# Patient Record
Sex: Male | Born: 1950
Health system: Southern US, Community
[De-identification: ages and names within clinical notes are randomized; demographics above are authoritative.]

## PROBLEM LIST (undated history)

## (undated) DIAGNOSIS — K5090 Crohn's disease, unspecified, without complications: Secondary | ICD-10-CM

## (undated) DIAGNOSIS — R0600 Dyspnea, unspecified: Secondary | ICD-10-CM

## (undated) DIAGNOSIS — I219 Acute myocardial infarction, unspecified: Secondary | ICD-10-CM

## (undated) DIAGNOSIS — I10 Essential (primary) hypertension: Secondary | ICD-10-CM

## (undated) DIAGNOSIS — F329 Major depressive disorder, single episode, unspecified: Secondary | ICD-10-CM

## (undated) DIAGNOSIS — I2510 Atherosclerotic heart disease of native coronary artery without angina pectoris: Secondary | ICD-10-CM

## (undated) DIAGNOSIS — I639 Cerebral infarction, unspecified: Secondary | ICD-10-CM

## (undated) DIAGNOSIS — I6529 Occlusion and stenosis of unspecified carotid artery: Secondary | ICD-10-CM

## (undated) DIAGNOSIS — F32A Depression, unspecified: Secondary | ICD-10-CM

## (undated) DIAGNOSIS — N529 Male erectile dysfunction, unspecified: Secondary | ICD-10-CM

## (undated) DIAGNOSIS — K5190 Ulcerative colitis, unspecified, without complications: Secondary | ICD-10-CM

## (undated) DIAGNOSIS — Z9289 Personal history of other medical treatment: Secondary | ICD-10-CM

## (undated) DIAGNOSIS — N200 Calculus of kidney: Secondary | ICD-10-CM

## (undated) DIAGNOSIS — G459 Transient cerebral ischemic attack, unspecified: Secondary | ICD-10-CM

## (undated) DIAGNOSIS — E785 Hyperlipidemia, unspecified: Secondary | ICD-10-CM

## (undated) HISTORY — DX: Crohn's disease, unspecified, without complications: K50.90

## (undated) HISTORY — DX: Occlusion and stenosis of unspecified carotid artery: I65.29

## (undated) HISTORY — PX: HERNIA REPAIR: SHX51

## (undated) HISTORY — DX: Male erectile dysfunction, unspecified: N52.9

## (undated) HISTORY — DX: Ulcerative colitis, unspecified, without complications: K51.90

## (undated) HISTORY — DX: Atherosclerotic heart disease of native coronary artery without angina pectoris: I25.10

## (undated) HISTORY — PX: OTHER SURGICAL HISTORY: SHX169

## (undated) HISTORY — DX: Hyperlipidemia, unspecified: E78.5

## (undated) HISTORY — DX: Cerebral infarction, unspecified: I63.9

## (undated) HISTORY — DX: Calculus of kidney: N20.0

## (undated) HISTORY — DX: Major depressive disorder, single episode, unspecified: F32.9

## (undated) HISTORY — PX: COLONOSCOPY: SHX174

## (undated) HISTORY — DX: Depression, unspecified: F32.A

---

## 1993-01-19 HISTORY — PX: KNEE ARTHROSCOPY: SUR90

## 1998-06-28 ENCOUNTER — Ambulatory Visit (HOSPITAL_COMMUNITY): Admission: RE | Admit: 1998-06-28 | Discharge: 1998-06-28 | Payer: Self-pay | Admitting: Urology

## 1998-07-29 ENCOUNTER — Emergency Department (HOSPITAL_COMMUNITY): Admission: EM | Admit: 1998-07-29 | Discharge: 1998-07-29 | Payer: Self-pay | Admitting: Emergency Medicine

## 1999-11-14 ENCOUNTER — Ambulatory Visit (HOSPITAL_COMMUNITY): Admission: RE | Admit: 1999-11-14 | Discharge: 1999-11-14 | Payer: Self-pay | Admitting: Internal Medicine

## 1999-11-14 ENCOUNTER — Encounter: Payer: Self-pay | Admitting: Urology

## 1999-12-15 ENCOUNTER — Emergency Department (HOSPITAL_COMMUNITY): Admission: EM | Admit: 1999-12-15 | Discharge: 1999-12-15 | Payer: Self-pay | Admitting: Emergency Medicine

## 1999-12-19 ENCOUNTER — Ambulatory Visit (HOSPITAL_COMMUNITY): Admission: RE | Admit: 1999-12-19 | Discharge: 1999-12-19 | Payer: Self-pay | Admitting: Urology

## 1999-12-19 ENCOUNTER — Encounter: Payer: Self-pay | Admitting: Urology

## 2000-02-10 ENCOUNTER — Encounter: Admission: RE | Admit: 2000-02-10 | Discharge: 2000-03-25 | Payer: Self-pay | Admitting: Internal Medicine

## 2002-09-21 ENCOUNTER — Encounter: Payer: Self-pay | Admitting: Emergency Medicine

## 2002-09-21 ENCOUNTER — Inpatient Hospital Stay (HOSPITAL_COMMUNITY): Admission: EM | Admit: 2002-09-21 | Discharge: 2002-09-23 | Payer: Self-pay | Admitting: Emergency Medicine

## 2002-10-06 ENCOUNTER — Ambulatory Visit (HOSPITAL_COMMUNITY): Admission: RE | Admit: 2002-10-06 | Discharge: 2002-10-07 | Payer: Self-pay | Admitting: Cardiology

## 2003-01-20 DIAGNOSIS — I219 Acute myocardial infarction, unspecified: Secondary | ICD-10-CM

## 2003-01-20 DIAGNOSIS — Z9289 Personal history of other medical treatment: Secondary | ICD-10-CM

## 2003-01-20 HISTORY — PX: CORONARY ARTERY BYPASS GRAFT: SHX141

## 2003-01-20 HISTORY — DX: Acute myocardial infarction, unspecified: I21.9

## 2003-01-20 HISTORY — DX: Personal history of other medical treatment: Z92.89

## 2003-04-24 ENCOUNTER — Ambulatory Visit (HOSPITAL_COMMUNITY): Admission: RE | Admit: 2003-04-24 | Discharge: 2003-04-25 | Payer: Self-pay | Admitting: Cardiology

## 2003-04-30 ENCOUNTER — Inpatient Hospital Stay (HOSPITAL_COMMUNITY)
Admission: RE | Admit: 2003-04-30 | Discharge: 2003-05-05 | Payer: Self-pay | Admitting: Thoracic Surgery (Cardiothoracic Vascular Surgery)

## 2004-08-29 ENCOUNTER — Emergency Department (HOSPITAL_COMMUNITY): Admission: EM | Admit: 2004-08-29 | Discharge: 2004-08-29 | Payer: Self-pay | Admitting: Emergency Medicine

## 2006-06-20 HISTORY — PX: CARDIAC CATHETERIZATION: SHX172

## 2006-07-28 ENCOUNTER — Inpatient Hospital Stay (HOSPITAL_COMMUNITY): Admission: RE | Admit: 2006-07-28 | Discharge: 2006-07-29 | Payer: Self-pay | Admitting: Cardiology

## 2009-10-24 ENCOUNTER — Ambulatory Visit (HOSPITAL_COMMUNITY): Admission: RE | Admit: 2009-10-24 | Discharge: 2009-10-24 | Payer: Self-pay | Admitting: Gastroenterology

## 2009-11-04 ENCOUNTER — Encounter: Admission: RE | Admit: 2009-11-04 | Discharge: 2009-11-04 | Payer: Self-pay | Admitting: Gastroenterology

## 2010-06-03 NOTE — Cardiovascular Report (Signed)
NAMESPENCE, SOBERANO             ACCOUNT NO.:  [id number]   MEDICAL RECORD NO.:  [id number]          PATIENT TYPE:  OIB   LOCATION:  2807                         FACILITY:  [facility]   PHYSICIAN:  Barnett Abu, M.D.  DATE OF BIRTH:  08/04/1950   DATE OF PROCEDURE:  DATE OF DISCHARGE:                            CARDIAC CATHETERIZATION   PROCEDURE PERFORMED:  Percutaneous coronary interventions/drug-eluting stent implantation,  saphenous vein graft - Ramus.   INDICATIONS:  Jacob [name] is a 60 year old man who is now  approximately 3 years status post CABG.  He had persistent angina  pectoris for past 2 years.  Coronary and saphenous vein graft  angiography has identified an 80-90% stenosis at the anastomosis of the  saphenous vein graft to the ramus intermedius which is a large vessel.  Other vein grafts are open and adequate perfusion to the remained of the  myocardium is present.  He is brought to catheterization laboratory at  this time for percutaneous coronary intervention for treatment of his  angina pectoris.   PROCEDURE NOTE:  The patient was brought to the cardiac catheterization  laboratory in fasting state.  The right groin was prepped and draped in  usual sterile fashion.  Local anesthesia was obtained with infiltration  of 1% lidocaine.  A 6-French catheter sheath was inserted percutaneously  into the right femoral artery utilizing an anterior approach over  guiding J-wire.  The patient then received 0.75 mg/kg bolus of  Bivalirudin followed by 1.25 mg/kg per hour constant infusion.  The  resultant ACT was 379 seconds.  The patient received 300 mg of Plavix  earlier today.  A 6-French LCB guiding catheter was then advanced to the  ascending aorta where the saphenous vein graft os to the ramus  intermedius was engaged.  Cineangiography was performed in LAO and RAO  projections.  A 0.014 Luge intracoronary guidewire was passed across the  lesion without  difficulty.  Initial balloon dilatation was performed  with a 3.0/15 mm mild Maverick.  This was inflated to 6 atmospheres for  less than 1 minute.  This balloon was deflated and removed and a 3.5/15  mm Medtronic Endeavor drug-eluting stent was then advanced into place  and deployed at a peak pressure of 14 atmospheres.  Stent balloon was  deflated and removed and a 3.75/8 mm Quantum Maverick was advanced into  place such that it covered the anastomotic region, but did not enter the  more distal portion of the stent in the native artery.  It was inflated  to 14 atmospheres for less than 1 minute.  It was then deflated and  withdrawn slightly and subsequently inflated to the more proximal  portion but only to 16 atmospheres, again for less than one minute.  This post dilatation balloon was deflated and removed and  cineangiography and in orthogonal views both with and without the  guidewire in place.  Documented adequate patency.  The guiding catheter  and guidewire were then removed.  A right femoral arteriogram in the 45  degrees RAO angulation via the catheter sheath by hand injection  documented adequate anatomy  for placement percutaneous closure device  Angio-Seal.  This was subsequently deployed with good hemostasis and  intact distal pulse.  The patient was transported to recovery area in  stable condition.   ANGIOGRAPHY:  As mentioned, the lesion treated was in the anastomotic  region of the saphenous vein graft to the ramus intermedius.  It was  eccentric, short and 80% stenotic.  Following balloon dilatation and  stent implantation, there was no residual stenosis.  An excellent step  down could be seen at the distal portion of the vessel.  There was a  slight step-up at the proximal portion of the stented segment.   FINAL IMPRESSION:  1. Successful PCI/drug-eluting stent implantation, saphenous vein      graft - ramus intermedius.  2. Severe three-vessel atherosclerotic  coronary vascular disease.  3. I did perform coronary angiography of the right coronary in      anticipation of a need for stenting of the proximal      right coronary lesion seen on diagnostic angiography.  However, it      was not present to any significant degree on today's angiography      and probably represented proximal catheter induced spasm.  4. Typical angina was not reproduced with device insertion or balloon      inflation      Barnett Abu, M.D.  Electronically Signed     JHE/MEDQ  D:  07/28/2006  T:  07/28/2006  Job:  [id number]

## 2010-06-03 NOTE — Discharge Summary (Signed)
Jacob [name], Jacob [name]             ACCOUNT NO.:  [id number]   MEDICAL RECORD NO.:  [id number]          PATIENT TYPE:  INP   LOCATION:  6532                         FACILITY:  [facility]   PHYSICIAN:  Barnett Abu, M.D.  DATE OF BIRTH:  12/15/1950   DATE OF ADMISSION:  07/28/2006  DATE OF DISCHARGE:  07/29/2006                               DISCHARGE SUMMARY   DISCHARGE DIAGNOSES:  1. Coronary artery disease status post drug-eluting stent to the      saphenous vein graft to the ramus.  2. Known coronary artery disease.  3. History of coronary artery bypass grafting in April of 2005.  4. Dyslipidemia.  5. Crohn's disease.  6. Nephrolithiasis.  7. Long-term medication use.   HOSPITAL COURSE:  Jacob [name] was admitted to [facility] on July 28, 2006  with recurrent angina pectoris.  Cardiac catheterization in June of 2008  showed high-grade stenosis of the saphenous vein graft to the ramus  intermedius.  He was admitted and underwent drug-eluting stent placement  to this area resulting in an 80% lesion down to 0% stenosis  postprocedure with good flow.  Patient was Angio-Sealed.  Patient  remained in the hospital overnight, remained stable, discharged to home.   The patient's labs on discharge:  Sodium 141, potassium 3.9, BUN 11,  creatinine 1.43, hemoglobin 13.4, hematocrit 39.0, platelets 178.   DISCHARGE MEDICATIONS:  1. Plavix 75 mg a day.  2. Enteric-coated aspirin 325 mg a day.  3. Wellbutrin 150 mg a day.  4. Toprol XL 50 mg a day.  5. Ranexa 500 mg b.i.d.  6. Pravastatin 40 mg a day.  7. Sublingual nitroglycerin p.r.n. chest pain.   He is to stop his Imdur.   Follow up with Dr. Gwendel Hanson extender on July 28 at 10:30 a.m.   Clean cath site gently with soap and water, no scrubbing.  Remain on a  low sodium, heart-healthy diet.  Increase activity slowly.  May shower  and bather.  No lifting over ten pounds for weeks, no driving for two  days.      Jacob [name],  P.A.      Barnett Abu, M.D.  Electronically Signed    LB/MEDQ  D:  07/29/2006  T:  07/29/2006  Job:  [id number]

## 2010-06-06 NOTE — Consult Note (Signed)
NAME:  Jacob [name], Jacob [name]                       ACCOUNT NO.:  [id number]   MEDICAL RECORD NO.:  [id number]                   PATIENT TYPE:  OIB   LOCATION:  3532                                 FACILITY:  [facility]   PHYSICIAN:  Revonda Standard. Roxan Hockey, M.D.         DATE OF BIRTH:  1950-09-23   DATE OF CONSULTATION:  04/24/2003  DATE OF DISCHARGE:                                   CONSULTATION   Jacob [name] is a 60 year old gentleman who presents with a chief  complaint of chest pain.   HISTORY OF PRESENT ILLNESS:  Jacob [name] is a 60 year old gentleman with  a strong family history of coronary disease.  He had his first presentation  with unstable angina in September of 2004 and at that time he underwent  sequential stenting with drug-eluting stents to the ramus intermedius on the  3rd of September followed by stenting of the LAD on the 17th.  He also had a  residual 70% right posterior descending stenosis.  He initially did well but  over the past several months he has been having class 2 exertional angina.  He develops anterior chest discomfort as wells as shortness of breath with  moderate exercise or exertion.  This has almost always been when it is very  cold outside.  He does __________ nocturnal pain.  He was seen back in  consultation by Dr. Leonia Reeves and underwent repeat cardiac catheterization  today that showed severe three-vessel coronary disease and left main  disease.  He had 75% distal left main stenosis, 60% proximal LAD, 50%  proximal circumflex and his ramus had proximal 30%-40% stenosis.  His right  coronary artery is 60%-70% in the acute marginal and 70% in his posterior  descending.  Left ventricular function was preserved with an ejection  fraction of 60%.   CURRENT MEDICATIONS:  1. Plavix 75 mg daily.  2. Toprol XL 50 mg daily.  3. Nitroglycerin p.r.n.  4. Vytorin 10-40 one p.o. daily.  5. Aspirin 325 mg daily.   ALLERGIES:  He has no known drug  allergies.   PAST MEDICAL HISTORY:  Significant for coronary artery disease, see HPI.  Also, a history of Crohn's disease, hyperlipidemia and nephrolithiasis.   PAST SURGICAL HISTORY:  1. Right knee arthroplasty in 1995.  2. Carpal tunnel syndrome, left forearm, 1998.   FAMILY HISTORY:  Significant for the father dying at age 22 of an MI.   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  He is married.  He  occasionally uses alcohol.  He dips snuff and has for 22 years.   REVIEW OF SYSTEMS:  He has been feeling tired and rundown lately.  He does  chest discomfort and shortness of breath with moderate activity.  He has not  had any paroxysmal nocturnal dyspnea, orthopnea or peripheral edema.  He  does bleed and bruise easily since he has been on Plavix but had no history  prior to that.  All other systems are negative.   PHYSICAL EXAMINATION:  CONSTITUTIONAL: Jacob [name] is a well-appearing 23-  year-old white male in no acute distress.  He is well-developed and well-  nourished, in no acute distress.  VITAL SIGNS: Blood pressure 118/76, pulse 68, respirations are 20, he is 6  feet tall and 227 pounds.  HEENT EXAM: Unremarkable.  NECK: Supple with no thyromegaly, adenopathy or bruits.  CARDIAC EXAM: Regular rate and rhythm.  Normal S1 and S2.  No rubs, murmurs  or gallops.  LUNGS: Clear to auscultation and percussion bilaterally.  ABDOMEN: Soft and nontender.  EXTREMITIES: Without clubbing, cyanosis or edema.  He does have positive a  Allen's sign on the left side with delayed filling with radial compression.  He has 2+ radial and dorsalis pedis and posterior tibial pulses bilaterally.  There are no significant varicosities.   LABORATORY DATA:  Glucose 99, BUN and creatinine 10 and 1.2, sodium 138,  potassium 4.3, CO2 of 30, hemoglobin 41, white count 5, platelets 196, PT  12.2, PTT 33.  Last cholesterol panel showed a total cholesterol of 164, LDL  was 113, HDL  44.   IMPRESSION:  Jacob [name] is a 60 year old gentleman with recurrent  coronary artery disease.  He first presented with this in September of last  year, now has recurrent class 2 symptoms and catheterization has a 75%  distal left main stenosis.  Coronary artery bypass grafting is indicated for  survival benefit and relief of symptoms. I discussed in detail with him the  proposed operation including the incision to be used, expected outcomes and  expected recovery.  He understands the indications, risks, benefits and  alternatives.  He understands the risks including but not limited to death,  stroke, MI, DVT, PE, bleeding, possible  need for transfusion, infection as  well as organ system dysfunction.  He is a low risk candidate with all of  these except for bleeding because he is on Plavix.  He will be at high risk  for massive transfusion and return to OR if operated on urgently.  He has no  unstable symptoms, therefore, it should safe to wait.  I recommended we stop  Plavix for at least 5-7 days prior to his surgery to minimize his risk for  perioperative bleeding.  He will remain on aspirin during that time.   He understands that this operation is palliative and not curative and I  believe he is at risk for a future cardiac event as well as recurrent  disease and essentially the need for additional angioplasty and more bypass  surgery in the future.  Will plan to use bilateral mammary arteries due to  his young age but will need to use saphenous veins as well.  He is  not a candidate for a radial harvest due to his positive Allen's test.  The  plan will be for him to discontinue his Plavix , the 5-7 day window would  include surgery for next Monday or Tuesday depending on the operative  availability, which we will check on in the morning.                                               Revonda Standard Roxan Hockey, M.D.    SCH/MEDQ  D:  04/24/2003  T:  04/26/2003  Job:   [id number]  cc:   Barnett Abu, M.D.  [address]  [address] [place] [address]  Fax: [phone]   Leilani Merl, M.D.  [phone]  [place] [address]  Fax: [phone]

## 2010-06-06 NOTE — Cardiovascular Report (Signed)
NAME:  Jacob [name], Jacob [name] NO.:  [id number]   MEDICAL RECORD NO.:  [id number]                   PATIENT TYPE:  OIB   LOCATION:  2867                                 FACILITY:  [facility]   PHYSICIAN:  Barnett Abu, M.D.               DATE OF BIRTH:  12/30/1950   DATE OF PROCEDURE:  04/24/2003  DATE OF DISCHARGE:                              CARDIAC CATHETERIZATION   PROCEDURES PERFORMED:  1. Left heart catheterization.  2. Coronary angiography.  3. Left ventriculogram.   INDICATION:  Alexio Sroka is 60 year old man who, in September 2004,  underwent PTCA and stent implantation in the mid ramus intermedius and the  proximal and ostial left anterior descending artery for class III angina  pectoris.  He has not had a myocardial infarction.  He has developed  recurrent class II angina pectoris; this has been especially true over the  past two winter months.  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  Therefore, he is brought now to  cardiac catheterization laboratory to identify possible in-stent restenosis  or progressive disease and provide for further therapeutic options.   PROCEDURAL NOTE:  The patient was brought to the cardiac catheterization  laboratory in the fasting state.  The right groin was prepped and draped in  the usual sterile fashion.  Local anesthesia was obtained with infiltration  of 1% lidocaine.  A 5-French catheter sheath was inserted percutaneously  into the right femoral artery utilizing and anterior approach over a guiding  J-wire.  A 110-cm pigtail catheter was used to measure pressures in the  ascending aorta and in the left ventricle, both prior to and following the  ventriculogram.  A 30-degree RAO scinti left ventriculogram was performed  utilizing the power injector; 44 cc of nonionic contrast were injected at  13  cc per second.  Following the sublingual administration of 0.4 mg of  nitroglycerin, scinti angiography of the left and right coronary arteries  was conducted in multiple LAO and RAO projections utilizing 5-French #4 left  and right Judkins catheters.  All catheter manipulations were performed  using fluoroscopic observation and exchanges performed over a long guiding J-  wire.  At the completion of the procedure, the catheter and catheter sheath  were removed.  Hemostasis was achieved by direct pressure.  The patient was  transported to the recovery area in stable condition with an intact distal  pulse.   HEMODYNAMICS:  Systemic arterial pressure of 127/83 with a mean of 102 mmHg.  There was no systolic gradient across the aortic valve.  The left  ventricular end-diastolic pressure was 13 mmHg pre-ventriculogram.   ANGIOGRAPHY:  The left ventriculogram demonstrated normal chamber size and  normal global systolic function with anterolateral hypokinesis.  Stents were  clearly visible  in the proximal left coronary artery branches.  There is no  significant mitral regurgitation and the aortic valve was trileaflet and  opens normally.   There was right-dominant coronary system present.  The main left coronary  artery is highly diseased; the vessel contains a distal 75-80% narrowing  just before it trifurcates into the anterior descending, the ramus  intermedius and the proximal circumflex. Of note, the ramus intermedius is  as large as the anterior descending artery.   The left anterior descending artery itself is moderately diseased; there is  a stent in the proximal segment which reaches to the origin and extends  approximately 20 mm.  The proximal segment is 50% re-narrowed.  The ongoing  anterior descending artery is relatively narrow but without significant  obstruction and no significant diagonal branches arise.  It reaches but does  not traverse the apex .   The left  circumflex coronary artery is moderately diseased; as mentioned,  there is a 50% narrowing in the proximal/ostial portion of the left  circumflex which amounts to two small first and second branches and then a  large third marginal branch which is on the obtuse margin.  There is a 40%  narrowing in the mid portion of the circumflex just before the origin of the  second small marginal.  There is a large ramus intermedius that has a stent  in the mid portion that is widely patent.  The proximal portion is diffusely  diseased and approximately 40% stenotic.   The right coronary artery and its branches were moderately diseased; the  vessel contains luminal irregularities and there is a 30% narrowing in the  proximal segment.  There is no significant narrowing in the mid portion.  There is a 20-30% stenosis in the distal portion.  There is a moderate-sized  acute marginal branch that has a 70% narrowing in its origin.  There is a  small to moderate-sized posterior descending artery without significant  obstruction.  There is a moderate-sized posterolateral segment with two left  ventricular branches, the first of which is moderate in size and contains a  50% proximal narrowing.   Collateral vessels are not seen.   FINAL IMPRESSION:  1. Atherosclerotic cardiovascular disease, left main and severe two-vessel.  2. Intact biventricular size and global systolic function with mild regional     wall motion abnormality as noted.  3. Normal central aortic pressure and left ventricular end-diastolic     pressure.   PLAN/RECOMMENDATIONS:  The patient will be referred for coronary bypass  surgery given this progressive and recurrent disease, especially in the  distal left main.                                               Barnett Abu, M.D.    JHE/MEDQ  D:  04/24/2003  T:  04/26/2003  Job:  [id number]   cc:   Leilani Merl, M.D.  [address] [place]  [place] [address]  Fax: [phone]

## 2010-06-06 NOTE — Op Note (Signed)
NAME:  Jacob, [name] NO.:  [id number]   MEDICAL RECORD NO.:  [id number]                   PATIENT TYPE:  INP   LOCATION:  2315                                 FACILITY:  [facility]   PHYSICIAN:  Revonda Standard. Roxan Hockey, M.D.         DATE OF BIRTH:  May 31, 1950   DATE OF PROCEDURE:  04/30/2003  DATE OF DISCHARGE:                                 OPERATIVE REPORT   PREOPERATIVE DIAGNOSIS:  Left main and three vessel coronary disease.   POSTOPERATIVE DIAGNOSIS:  Left main and three vessel coronary disease.   PROCEDURE:  Median sternotomy, extracorporeal circulation, coronary artery  bypass grafting x 5 (left internal mammary to left anterior descending, free  right internal mammary artery to the second obtuse marginal, saphenous vein  graft to ramus intermedius, sequential saphenous vein graft to the posterior  descending and posterior lateral), endoscopic vein harvest right thigh.   SURGEON:  Revonda Standard. Roxan Hockey, M.D.   ASSISTANTHoyle Sauer A. Zigmund Gottron.   ANESTHESIA:  General.   FINDINGS:  Good quality targets.  LAD intramyocardial.  Good quality  conduits.  Left ventricular hypertrophy.   INDICATIONS:  Jacob [name] is a 60 year old gentleman who has a history of  coronary disease dating back to September of last year when he presented  with angina and had angioplasty and stenting of his LAD and ramus  intermedius.  He now presents with a recurrent class II anginal symptoms and  a catheterization shows 75% distal left main stenosis involving the takeoff  of his circumflex, ramus and LAD.  He also has significant disease in the  right coronary system.  The patient was referred for coronary artery bypass  grafting.  The indications, risks, benefits and alternatives were discussed  in detail with the patient.  He understood and accepted the risks and agreed  to proceed.  He was not a candidate for radial artery harvest due to the  positive Allen's  test.  The decision was made to use bilateral mammary  arteries.   DESCRIPTION OF PROCEDURE:  Jacob [name] was brought to the preop holding  area on April 30, 2003.  There the anesthesia service placed lines to  monitor arterial, central venous and pulmonary arterial pressure.  Intravenous antibiotics were administered.  The patient was taken to the  operating room, anesthetized and intubated.  A Foley catheter was placed.  The chest, abdomen and legs were prepped and draped in the usual fashion.   A median sternotomy was performed.  The left internal mammary was harvested  using standard technique.  The mammary was a good quality vessel.  Simultaneously, an incision was made in the medial aspect of the right leg  at the level of the knee, and the greater saphenous vein was harvested  endoscopically from the right thigh.  It was of relatively small caliber for  the patient's size but was of good quality.  The patient was given 5000  units  of heparin prior to dividing the distal end of the left mammary  artery.  There was excellent flow through the vessel.  The mammary was  placed and a papaverine soaked sponge was placed into the left pleural  space.   Next, the right internal mammary artery was harvested again using standard  technique.  It also was a good size vessel which appeared to be of good  quality.  The right mammary had excellent flow and the distal end was  divided as well.  This was planned for a free graft. It was divided and the  proximal stump was suture ligated with a 2-0 silk suture and then stainless  steel clip was applied as well.  The right mammary was placed in heparinized  papaverine saline solution.   The remainder of the full heparin dose was given.  The pericardium was  opened. The ascending aorta was inspected and palpated.  Of note, it was a  relatively short aorta for a man of this size which could make access  difficult on redo situations.  The aorta  was cannulated to be concentric and  2-0 Ethibond nonpledgeted pursestring sutures, a dual stage venous cannula  was placed via pursestring suture in the right atrial appendage.  Cardiopulmonary bypass was instituted, and the patient was cooled to 32  degrees Celsius.  The coronary arteries were inspected and anastomotic sites  chosen.  The conduits were inspected and cut to length.  A foam pad was  placed in the pericardium to protect the phrenic nerves.  A temperature  probe was placed in the myocardial septum.  A cardioplegia cannula was  placed in the ascending aorta.   The aorta was crossclamped.  The left ventricle was entered via the aortic  root and then cardiac arrest was achieved with a combination of cold  antegrade cardioplegia and topical iced saline.  After achieving cardiac  arrest, a myocardial aorta was entered in the aorta and the following distal  anastomosis were performed.   First, a reverse saphenous vein graft was placed end-to-side to the ramus  intermedius.  This was a 1.5 mm good quality vessel.  The vein graft was of  good quality.  The anastomosis was performed end-to-side with a running 7-0  Prolene suture.  This anastomosis was probed proximally and distally to  ensure patency.  There was good flow through the graft.  Cardioplegia was  administered.  There was good hemostasis.   Next, a reverse saphenous vein graft was placed end-to-side to the posterior  descending and posterior lateral branches of the right coronary.  There were  three branches supplying the inferior wall.  These were the two largest of  the three vessels.  The third vessel was small and tortuous and not  graftable.  The vein graft was anastomosis side-to-side to the posterior  descending with a running 7-0 Prolene suture.  This anastomosis probed  easily proximally and distally.  There was good flow through this anastomosis.  The distal end of the vein then was cut to length and   anastomosed to PL2 with a running 7-0 Prolene suture.  Both target vessels  were 1.5 mm and good quality.  Cardioplegia was administered.  There was  good flow through the graft, and there was good hemostasis at the  anastomoses.  Next, the distal end of the free right mammary artery was  spatulated.  It was 1.5 mm at its distal end and approximately 3 mm at its  proximal end.   The right mammary then was anastomosed end-to-side to OM-2 with a running 8-  0 Prolene suture.  Again, this anastomosis probed easily proximally and  distally.  Cold heparinized saline was flushed through the graft with  excellent flow.  The mammary pedicle was tacked to the epicardial surface of  the heart with 6-0 Prolene sutures.   Additional cardioplegia then was administered down the vein grafts and the  aortic root.  There was good backbleeding from the free right mammary graft.  The vein grafts were cut to length.   The left internal mammary artery was brought through a window in the  pericardium and the distal end was spatulated.  It was then anastomosed to  the mid LAD.  The mid LAD was intramyocardial.  It was a 1.5 mm good quality  vessel at the site of the anastomosis.  An end-to-side anastomosis was  performed with a running 8-0 Prolene suture.  At the completion of the  mammary to left anterior descending anastomosis, the bulldog clamps were  briefly remove to inspect for hemostasis.  Immediate rapid septal rewarming  was noted.  The bulldog clamped was replaced.  Bleeding was controlled from  the myocardial dissection.  The mammary pedicle was tacked to the epicardial  surface of the heart with 6-0 Prolene sutures.   Additional cardioplegia was then administered down the root to once again  achieve adequate septal cooling.  The cardioplegia cannula then was removed  from the ascending aorta.  The proximal anastomoses were performed to 4.0 mm  punch aortotomies.  A running 7-0 Prolene suture was  first used to  anastomosis the proximal end of the right mammary graft to the aorta.  The  proximal vein graft anastomosis was performed with 6-0 Prolene sutures.  At  the completion of the final proximal anastomosis the patient was placed in  steep Trendelenburg position.  The aortic root was deaired and the aortic  cross clamp was removed.  The total cross clamp time was 110 minutes.  The  lidocaine was administered, and the bulldog clamp was removed from the left  mammary artery prior to removal of the cross clamp.  Residual air was  evacuated from the vein grafts, and the bulldog clamps were removed from  them.  All proximal and distal anastomoses were inspected for hemostasis.  While the patient was being rewarmed, epicardial pacing wires were placed on  the right ventricle and right atrium.  When the patient had been rewarmed to  a core temperature of 37 degrees Celsius, he was weaned from cardiopulmonary bypass without difficulty and without inotropic support.  He was atrially  for rate at the time of separation from bypass.  Total bypass time was 172  minutes.   A test dose of Protamine was administered and was well tolerated.  The  atrial and aortic cannulae were removed.  There was good hemostasis at the  cannulation sites.  The remainder of the Protamine was administered without  incident.  The chest was irrigated with one liter of warm normal saline  containing 1 g of vancomycin.  Hemostasis was achieved but was only fair.  There was diffuse bleeding particularly from the bone.  The chest was  irrigated with one liter of warm normal saline containing 1 g of vancomycin.  The pericardium was reapproximated in the midline with interrupted 3-0 silks  sutures.  It came together easily without tension.  Bilateral pleural and  single mediastinal chest tubes  were placed through separate subcostal  incisions.  The sternum was closed with heavy gauge interrupted stainless  steel wires.   The remainder of the incisions were closed with the standard  fashion with subcuticular skin closures.  All sponge, needle and instrument  counts were correct at the end of the procedure.  There were no  intraoperative complications.  The patient was taken from the operating room  to the surgical intensive care unit in critical but stable condition.                                               Revonda Standard Roxan Hockey, M.D.    SCH/MEDQ  D:  04/30/2003  T:  05/01/2003  Job:  [id number]   cc:   Barnett Abu, M.D.  [address]  [address] [place] [address]  Fax: [phone]   Leilani Merl, M.D.  [phone]  [place] [address]  Fax: [phone]

## 2010-06-06 NOTE — Discharge Summary (Signed)
NAME:  Jacob [name], Jacob [name] NO.:  [id number]   MEDICAL RECORD NO.:  [id number]                   PATIENT TYPE:  INP   LOCATION:  2033                                 FACILITY:  [facility]   PHYSICIAN:  Revonda Standard. Roxan Hockey, M.D.         DATE OF BIRTH:  [birth year]   DATE OF ADMISSION:  04/30/2003  DATE OF DISCHARGE:  05/05/2003                                 DISCHARGE SUMMARY   ADMISSION DIAGNOSIS:  Chest pain.   ADDITIONAL/DISCHARGE DIAGNOSES:  1. Severe three-vessel coronary artery disease, status post previous     percutaneous transluminal coronary angioplasty and stent placement.  2. History of Crohn's disease.  3. History of nephrolithiasis.  4. Dyslipidemia.   PROCEDURE:  1. Coronary artery bypass grafting x5 (left internal mammary artery to the     left anterior descending, free right internal mammary artery to the     second obtuse marginal, saphenous vein graft to the ramus intermedius,     sequential saphenous vein graft to the posterior descending artery and     posterolateral).  2. Endoscopic vein harvest, right thigh.   HISTORY:  The patient is a 60 year old male with history of coronary artery  disease.  He was then followed by Dr. Leonia Reeves.  He is status post previous  PTCA and stenting of both the ramus intermedius and the ostial left anterior  descending in 2004.  He continued to have symptoms of anterior chest  discomfort, dyspnea on exertion.  He was recently seen back in the office  for followup by Dr. Leonia Reeves and it was recommended that he should proceed  with repeat cardiac catheterization at this time to reevaluate his anatomy.  He was brought into [facility]  on April 24, 2003 and cardiac  catheterization at that time showed normal left ventricular function with an  ejection fraction of 60%.  He was noted to have 75% stenosis of the left  main distally as well as 60% proximal LAD, 50% proximal left circumflex, 30-  40%  proximal ramus, and 60-70% proximal right coronary artery and acute  marginal.  Because of his progressive disease and failure of angioplasty and  stent placement, a cardiothoracic surgery consultation was obtained for  consideration of surgical revascularization.  The patient was seen by Dr.  Modesto Charon and his films were reviewed.  It was agreed that his best  course of action would be to proceed with surgery at this time.  Because the  patient had been on Plavix, it was felt to be in his best interests to allow  him to go home on a leave of absence and discontinue the Plavix for five to  seven days before returning for surgery.  The patient agreed to proceed and  was discharged home on April 25, 2003.  He was scheduled for outpatient  readmission on April 30, 2003.   HOSPITAL COURSE:  He was admitted on April 30, 2003 to [facility]  and was taken to the operating room by Dr. Modesto Charon.  He underwent  CABG x5 as described in detail above.  He tolerated the procedure well and  was transferred to the SICU in stable condition.  He was able to be  extubated shortly after surgery.  He was hemodynamically stable although  somewhat hypotensive on postoperative day #1.  He initially required a Neo-  Synephrine drip for blood pressure maintenance.  Over the course of his  first postoperative day, this was weaned and discontinued.  He remained in  the unit for further observation.  Chest tubes were removed and his  hemodynamic monitoring devices were removed.  He was mobilized with cardiac  rehab phase 1.  By postoperative day #2, he was off all drips and was felt  to be ready for transfer to the floor.  At that time he was noted to be  volume overloaded and was started on Lasix and potassium.  Postoperatively  he has done very well.  He has been restarted on __________  for his  hyperlipidemia.  He has also been started on aspirin and beta blocker  therapy.  He had one  brief episode of nonsustained ventricular tachycardia,  around 10 beats without treatment.  Since that time he has had no further  arrhythmias.  He has been diuresing well.  He is presently back down within  seven pounds of his preoperative weight.  On exam he still has 1+ lower  extremity edema.  He has been afebrile and all vital signs have been stable.  He has been weaned from supplemental oxygen and is maintaining O2  saturations of greater than 90% on room air.  His surgical incisions are all  healing well.  He is ambulating in the halls without difficulty.  He is  tolerating regular diet and his having normal bowel and bladder function.  His most recent labs drawn on May 03, 2003 showed potassium 3.8, which has  been supplemented, BUN 13, creatinine 1.2.  White count 7.7, platelets 123,  hemoglobin 9.4, hematocrit 26.9.  It is felt that if he continues to remain  stable for the next 24 hours and no other changes occur, he will be ready  for discharge home on May 05, 2003.   In anticipation of discharge, the pacing wires and chest tube sutures have  been removed.   DISCHARGE MEDICATIONS:  1. Enteric-coated aspirin 325 mg daily.  2. Toprol-XL 25 mg daily.  3. ___________ 10/40 mg one daily.  4. Lasix 40 mg daily times one week.  5. K-Dur 20 mEq daily times one week.  6. Tylox one to two q.4h. p.r.n. for pain.   DISCHARGE INSTRUCTIONS:  1. He is to refrain from driving, heavy lifting or strenuous activity.  2. He may continue ambulating daily and using his incentive spirometer.  3. He is asked to shower daily and clean his incisions with soap and water.  4. He will continue a low-fat, low-sodium diet.   FOLLOW UP:  He is asked to make an appointment to see Dr. Leonia Reeves in two  weeks and have a chest x-ray at that visit.  He will then see Dr.  Roxan Hockey on May 9 at 12 noon.  He is asked to bring his chest x-ray to this appointment for Dr. Roxan Hockey to review.  In the  interim, if he  experiences any problems or has questions, he should call our office  immediately.  Suzzanne Cloud, P.A.                        Revonda Standard Roxan Hockey, M.D.    GC/MEDQ  D:  05/04/2003  T:  05/06/2003  Job:  [id number]   cc:   Leilani Merl, M.D.  [address] [place] [address]  Fax: [phone]   Barnett Abu, M.D.  [address] [place]  [place] [address]  Fax: [phone]

## 2010-11-04 LAB — CBC
HCT: 39
MCV: 88.2
Platelets: 178
RDW: 13

## 2010-11-04 LAB — BASIC METABOLIC PANEL
BUN: 11
Chloride: 105
Glucose, Bld: 91
Potassium: 3.9

## 2012-10-17 ENCOUNTER — Encounter (HOSPITAL_COMMUNITY): Payer: Self-pay | Admitting: Pharmacy Technician

## 2012-10-17 ENCOUNTER — Encounter (HOSPITAL_COMMUNITY): Payer: Self-pay | Admitting: *Deleted

## 2012-10-17 ENCOUNTER — Other Ambulatory Visit: Payer: Self-pay | Admitting: Gastroenterology

## 2012-10-25 ENCOUNTER — Encounter (HOSPITAL_COMMUNITY): Payer: Self-pay | Admitting: Anesthesiology

## 2012-10-25 ENCOUNTER — Ambulatory Visit (HOSPITAL_COMMUNITY): Payer: Medicare HMO | Admitting: Anesthesiology

## 2012-10-25 ENCOUNTER — Encounter (HOSPITAL_COMMUNITY): Payer: Self-pay | Admitting: *Deleted

## 2012-10-25 ENCOUNTER — Ambulatory Visit (HOSPITAL_COMMUNITY)
Admission: RE | Admit: 2012-10-25 | Discharge: 2012-10-25 | Disposition: A | Discharge status: Home / Self Care | Payer: Medicare HMO | Source: Ambulatory Visit | Attending: Gastroenterology | Admitting: Gastroenterology

## 2012-10-25 ENCOUNTER — Encounter (HOSPITAL_COMMUNITY)
Admission: RE | Disposition: A | Discharge status: Home / Self Care | Payer: Self-pay | Source: Ambulatory Visit | Attending: Gastroenterology

## 2012-10-25 DIAGNOSIS — K6389 Other specified diseases of intestine: Secondary | ICD-10-CM | POA: Insufficient documentation

## 2012-10-25 DIAGNOSIS — K5190 Ulcerative colitis, unspecified, without complications: Secondary | ICD-10-CM | POA: Insufficient documentation

## 2012-10-25 DIAGNOSIS — E7800 Pure hypercholesterolemia, unspecified: Secondary | ICD-10-CM | POA: Insufficient documentation

## 2012-10-25 DIAGNOSIS — Z951 Presence of aortocoronary bypass graft: Secondary | ICD-10-CM | POA: Insufficient documentation

## 2012-10-25 DIAGNOSIS — I2510 Atherosclerotic heart disease of native coronary artery without angina pectoris: Secondary | ICD-10-CM | POA: Insufficient documentation

## 2012-10-25 HISTORY — PX: COLONOSCOPY WITH PROPOFOL: SHX5780

## 2012-10-25 HISTORY — DX: Personal history of other medical treatment: Z92.89

## 2012-10-25 HISTORY — DX: Acute myocardial infarction, unspecified: I21.9

## 2012-10-25 SURGERY — COLONOSCOPY WITH PROPOFOL
Anesthesia: Monitor Anesthesia Care

## 2012-10-25 MED ORDER — SODIUM CHLORIDE 0.9 % IV SOLN: INTRAVENOUS | Status: DC

## 2012-10-25 MED ORDER — KETAMINE HCL 10 MG/ML IJ SOLN
INTRAMUSCULAR | Status: DC | PRN
  Administered 2012-10-25: 40 mg via INTRAVENOUS

## 2012-10-25 MED ORDER — MIDAZOLAM HCL 5 MG/5ML IJ SOLN
INTRAMUSCULAR | Status: DC | PRN
  Administered 2012-10-25 (×2): 1 mg via INTRAVENOUS

## 2012-10-25 MED ORDER — PROPOFOL INFUSION 10 MG/ML OPTIME
INTRAVENOUS | Status: DC | PRN
  Administered 2012-10-25: 140 ug/kg/min via INTRAVENOUS

## 2012-10-25 MED ORDER — FENTANYL CITRATE 0.05 MG/ML IJ SOLN
INTRAMUSCULAR | Status: DC | PRN
  Administered 2012-10-25 (×2): 50 ug via INTRAVENOUS

## 2012-10-25 MED ORDER — LACTATED RINGERS IV SOLN
INTRAVENOUS | Status: DC
  Administered 2012-10-25: 1000 mL via INTRAVENOUS

## 2012-10-25 SURGICAL SUPPLY — 21 items

## 2012-10-25 NOTE — Op Note (Signed)
Problem: Left-sided ulcerative colitis diagnosed in 1979  Endoscopist: Earle Gell  Premedication: Propofol administered by anesthesia  Procedure: Surveillance colonoscopy The patient was placed in the left lateral decubitus position. Anal inspection and digital rectal exam were normal. Pentax pediatric colonoscope was introduced into the rectum and advanced to the cecum. A normal-appearing appendiceal orifice and ileocecal valve were identified. Colonic preparation for the exam today was good.  Rectum. Normal. Retroflexed view of the distal rectum normal.  Sigmoid colon and descending colon. Normal.  Splenic flexure. Normal.  Transverse colon. Normal.  Hepatic flexure. Normal.  Ascending colon. Normal.  Cecum and ileocecal valve. Normal.  Surveillance of mucosal biopsies. A total of 32 biopsies were performed along the length of the colon and rectum: 8 biopsies were performed from the right colon, 8 biopsies were performed from the transverse colon, 8 biopsies were performed from the descending colon, and 8 biopsies were performed from the rectosigmoid colon.   Assessment: Normal surveillance proctocolonoscopy to the cecum. Random colon biopsies to rule out mucosal dysplasia pending.

## 2012-10-25 NOTE — Preoperative (Signed)
Beta Blockers   Reason not to administer Beta Blockers:Not Applicable 

## 2012-10-25 NOTE — Transfer of Care (Signed)
Immediate Anesthesia Transfer of Care Note  Patient: Jacob [name]  Procedure(s) Performed: Procedure(s): COLONOSCOPY WITH PROPOFOL (N/A)  Patient Location: PACU  Anesthesia Type:MAC  Level of Consciousness: sedated  Airway & Oxygen Therapy: Patient Spontanous Breathing and Patient connected to face mask oxygen  Post-op Assessment: Report given to PACU RN and Post -op Vital signs reviewed and stable  Post vital signs: Reviewed and stable  Complications: No apparent anesthesia complications

## 2012-10-25 NOTE — H&P (Signed)
  Problem: Left-sided ulcerative colitis since 1979. Surveillance colonoscopy scheduled.  History: The patient is a 62 year old male born 05-25-1950. The patient was diagnosed with left sided ulcerative colitis in 1979.  The patient is scheduled to undergo a surveillance colonoscopy.  Past medical history: Coronary artery disease. Coronary artery bypass grafting surgery. Hypercholesterolemia. Left-sided ulcerative colitis. Kidney stones. Right knee arthroscopy. Left carpal tunnel surgery.  Medication allergies: None  Exam: The patient is alert and lying comfortably on the endoscopy stretcher. Abdomen is soft and nontender to palpation. Lungs are clear to auscultation. Cardiac exam reveals a regular rhythm.  Plan: Proceed with surveillance colonoscopy to

## 2012-10-25 NOTE — Anesthesia Preprocedure Evaluation (Addendum)
Anesthesia Evaluation  Patient identified by MRN, date of birth, ID band Patient awake    Reviewed: Allergy & Precautions, H&P , NPO status , Patient's Chart, lab work & pertinent test results, reviewed documented beta blocker date and time   Airway Mallampati: II TM Distance: >3 FB Neck ROM: Full    Dental  (+) Dental Advisory Given   Pulmonary neg pulmonary ROS,  breath sounds clear to auscultation        Cardiovascular hypertension, Pt. on home beta blockers + Past MI and + CABG Rhythm:Regular Rate:Normal     Neuro/Psych negative neurological ROS  negative psych ROS   GI/Hepatic negative GI ROS, Neg liver ROS,   Endo/Other  negative endocrine ROS  Renal/GU negative Renal ROS     Musculoskeletal negative musculoskeletal ROS (+)   Abdominal   Peds  Hematology negative hematology ROS (+)   Anesthesia Other Findings   Reproductive/Obstetrics                         Anesthesia Physical Anesthesia Plan  ASA: II  Anesthesia Plan: MAC   Post-op Pain Management:    Induction: Intravenous  Airway Management Planned:   Additional Equipment:   Intra-op Plan:   Post-operative Plan:   Informed Consent: I have reviewed the patients History and Physical, chart, labs and discussed the procedure including the risks, benefits and alternatives for the proposed anesthesia with the patient or authorized representative who has indicated his/her understanding and acceptance.   Dental advisory given  Plan Discussed with: CRNA  Anesthesia Plan Comments:         Anesthesia Quick Evaluation

## 2012-10-25 NOTE — Anesthesia Postprocedure Evaluation (Signed)
Anesthesia Post Note  Patient: Jacob [name]  Procedure(s) Performed: Procedure(s) (LRB): COLONOSCOPY WITH PROPOFOL (N/A)  Anesthesia type: MAC  Patient location: PACU  Post pain: Pain level controlled  Post assessment: Post-op Vital signs reviewed  Last Vitals: BP 106/68  Pulse 70  Temp(Src) 36.4 C (Oral)  Resp 18  Ht 5' 11"  (1.803 m)  Wt 213 lb (96.616 kg)  BMI 29.72 kg/m2  SpO2 96%  Post vital signs: Reviewed  Level of consciousness: awake  Complications: No apparent anesthesia complications

## 2012-10-26 ENCOUNTER — Encounter (HOSPITAL_COMMUNITY): Payer: Self-pay | Admitting: Gastroenterology

## 2012-12-12 ENCOUNTER — Other Ambulatory Visit: Payer: Self-pay | Admitting: *Deleted

## 2012-12-12 DIAGNOSIS — E785 Hyperlipidemia, unspecified: Secondary | ICD-10-CM

## 2012-12-12 DIAGNOSIS — Z79899 Other long term (current) drug therapy: Secondary | ICD-10-CM

## 2013-01-23 ENCOUNTER — Other Ambulatory Visit: Payer: Self-pay

## 2013-01-24 ENCOUNTER — Other Ambulatory Visit (INDEPENDENT_AMBULATORY_CARE_PROVIDER_SITE_OTHER): Payer: Medicare HMO

## 2013-01-24 DIAGNOSIS — Z79899 Other long term (current) drug therapy: Secondary | ICD-10-CM

## 2013-01-24 DIAGNOSIS — E785 Hyperlipidemia, unspecified: Secondary | ICD-10-CM

## 2013-01-24 LAB — ALT: ALT: 24 U/L (ref 0–53)

## 2013-01-25 ENCOUNTER — Encounter: Payer: Self-pay | Admitting: General Surgery

## 2013-01-25 ENCOUNTER — Other Ambulatory Visit: Payer: Self-pay | Admitting: General Surgery

## 2013-01-25 DIAGNOSIS — E785 Hyperlipidemia, unspecified: Secondary | ICD-10-CM

## 2013-01-25 LAB — LIPID PANEL W/DIRECT LDL/HDL RATIO
Cholesterol: 131 mg/dL (ref 0–200)
HDL: 46 mg/dL (ref >39)
LDL DIRECT: 71 mg/dL
LDL:HDL Ratio: 1.5 Ratio
Total Chol/HDL Ratio: 2.8 Ratio
Triglycerides: 96 mg/dL (ref <150)

## 2013-02-06 ENCOUNTER — Telehealth: Payer: Self-pay | Admitting: Cardiology

## 2013-02-06 NOTE — Telephone Encounter (Signed)
New message    Refill---metoprolol 71m, lisinopril 587m atorvastatin 404m---[facility]/90day supply---

## 2013-02-07 ENCOUNTER — Telehealth: Payer: Self-pay

## 2013-02-07 MED ORDER — METOPROLOL SUCCINATE ER 25 MG PO TB24: 25.0000 mg | ORAL_TABLET | Freq: Every morning | ORAL | Status: DC

## 2013-02-07 MED ORDER — SIMVASTATIN 40 MG PO TABS: 40.0000 mg | ORAL_TABLET | Freq: Every day | ORAL | Status: DC

## 2013-02-07 MED ORDER — LISINOPRIL 5 MG PO TABS: 5.0000 mg | ORAL_TABLET | Freq: Every day | ORAL | Status: DC

## 2013-02-07 NOTE — Telephone Encounter (Signed)
Rx sent in for pt.  Pt made aware.

## 2013-02-14 ENCOUNTER — Telehealth: Payer: Self-pay | Admitting: *Deleted

## 2013-02-14 NOTE — Telephone Encounter (Signed)
Patient stated that he received his medications and for some reason they sent him simvastatin but he is on atorvastatin. He wants to know if he was to go back to taking simvastatin as he was on it before but Dr Radford Pax changed him to atorvastatin. Please advise. Thanks, MI

## 2013-02-15 MED ORDER — ATORVASTATIN CALCIUM 40 MG PO TABS: 40.0000 mg | ORAL_TABLET | Freq: Every day | ORAL | Status: DC

## 2013-02-15 NOTE — Telephone Encounter (Signed)
Pt is aware.  

## 2013-02-15 NOTE — Telephone Encounter (Signed)
Pt is supposed to be taking atorvastatin 40 MG 1 tablet daily. New rx sent into pts pharmacy

## 2013-02-15 NOTE — Addendum Note (Signed)
Addended by: Lily Kocher on: 02/15/2013 10:10 AM   Modules accepted: Orders

## 2013-09-14 ENCOUNTER — Ambulatory Visit: Payer: Self-pay | Admitting: Cardiology

## 2013-09-14 ENCOUNTER — Encounter: Payer: Self-pay | Admitting: General Surgery

## 2013-09-14 ENCOUNTER — Ambulatory Visit (INDEPENDENT_AMBULATORY_CARE_PROVIDER_SITE_OTHER): Payer: Medicare HMO | Admitting: Cardiology

## 2013-09-14 ENCOUNTER — Encounter: Payer: Self-pay | Admitting: Cardiology

## 2013-09-14 VITALS — BP 108/70 | HR 54 | Ht 71.0 in | Wt 197.8 lb

## 2013-09-14 DIAGNOSIS — I10 Essential (primary) hypertension: Secondary | ICD-10-CM

## 2013-09-14 DIAGNOSIS — I2510 Atherosclerotic heart disease of native coronary artery without angina pectoris: Secondary | ICD-10-CM

## 2013-09-14 DIAGNOSIS — I498 Other specified cardiac arrhythmias: Secondary | ICD-10-CM

## 2013-09-14 DIAGNOSIS — E7800 Pure hypercholesterolemia, unspecified: Secondary | ICD-10-CM | POA: Insufficient documentation

## 2013-09-14 DIAGNOSIS — I25119 Atherosclerotic heart disease of native coronary artery with unspecified angina pectoris: Secondary | ICD-10-CM

## 2013-09-14 DIAGNOSIS — R001 Bradycardia, unspecified: Secondary | ICD-10-CM

## 2013-09-14 MED ORDER — ATORVASTATIN CALCIUM 40 MG PO TABS: 40.0000 mg | ORAL_TABLET | Freq: Every day | ORAL | Status: DC

## 2013-09-14 MED ORDER — LISINOPRIL 5 MG PO TABS: 5.0000 mg | ORAL_TABLET | Freq: Every day | ORAL | Status: DC

## 2013-09-14 MED ORDER — ASPIRIN EC 81 MG PO TBEC: 81.0000 mg | DELAYED_RELEASE_TABLET | Freq: Every day | ORAL | Status: DC

## 2013-09-14 MED ORDER — METOPROLOL SUCCINATE ER 25 MG PO TB24: 12.5000 mg | ORAL_TABLET | Freq: Every morning | ORAL | Status: DC

## 2013-09-14 NOTE — Patient Instructions (Signed)
Your physician has recommended you make the following change in your medication:  1.) decrease metoprolol to 12.5 mg daily.  (split tablet in half) 2.) change aspirin to 81 mg tablet daily  Your physician recommends that you return for lab work in: January 2016. (LIVER, ALT) Your physician wants you to follow-up in: [place].   You will receive a reminder letter in the mail two months in advance. If you don't receive a letter, please call our office to schedule the follow-up appointment. A

## 2013-09-14 NOTE — Progress Notes (Signed)
  [address], [place], [place]  [address] Phone: [phone] Fax:  [phone]  Date:  09/14/2013   ID:  Jacob [name], DOB 08-24-1950, MRN [id number]  PCP:  No primary provider on file.  Cardiologist:  Fransico Him, MD     History of Present Illness: Jacob [name] is a 63 y.o. male with a history of multivessel ASCAD s/p CABG, s.p PCI of SVG to RI 2008, dyslipidemia and HTN who presents back today for followup.  He is doing well.  He denies any chest pain, SOB, DOE, LE edema,  palpitations or syncope.  He occasionally gets dizziness if he is outside working in the yard.   Wt Readings from Last 3 Encounters:  09/14/13 197 lb 12.8 oz (89.721 kg)  10/25/12 213 lb (96.616 kg)  10/25/12 213 lb (96.616 kg)     Past Medical History  Diagnosis Date  . Myocardial infarction 2005  . History of blood transfusion 2005  . Angina pectoris     Recurrent, 1/07 stress CL showed normal perfusion  . Dyslipidemia   . Ulcerative colitis     Left sided  . Nephrolithiasis   . ED (erectile dysfunction)     Current Outpatient Prescriptions  Medication Sig Dispense Refill  . aspirin 325 MG tablet Take 325 mg by mouth daily.      Marland Kitchen atorvastatin (LIPITOR) 40 MG tablet Take 1 tablet (40 mg total) by mouth daily.  90 tablet  3  . lisinopril (PRINIVIL,ZESTRIL) 5 MG tablet Take 1 tablet (5 mg total) by mouth daily.  90 tablet  2  . metoprolol succinate (TOPROL-XL) 25 MG 24 hr tablet Take 1 tablet (25 mg total) by mouth every morning.  90 tablet  2   No current facility-administered medications for this visit.    Allergies:   No Known Allergies  Social History:  The patient  reports that he has never smoked. He has never used smokeless tobacco. He reports that he does not drink alcohol or use illicit drugs.   Family History:  The patient's family history includes CAD in his brother, brother, brother, father, and sister; Ovarian cancer in his mother.   ROS:  Please see the  history of present illness.      All other systems reviewed and negative.   PHYSICAL EXAM: VS:  BP 108/70  Pulse 54  Ht 5' 11"  (1.803 m)  Wt 197 lb 12.8 oz (89.721 kg)  BMI 27.60 kg/m2 Well nourished, well developed, in no acute distress HEENT: normal Neck: no JVD Cardiac:  normal S1, S2; RRR; no murmur Lungs:  clear to auscultation bilaterally, no wheezing, rhonchi or rales Abd: soft, nontender, no hepatomegaly Ext: no edema Skin: warm and dry Neuro:  CNs 2-12 intact, no focal abnormalities noted  EKG:  Sinus bradycardia at 54bpm with nonspecific T wave abnormality     ASSESSMENT AND PLAN:  1. ASCAD s/p CABG 2005 and subsequent DES to SVG to RI with no angina - continue ASA and change to 11m daily 2. Dyslipidemia - continue statin - recheck FLP and ALT in 01/2014 3. HTN well controlled - continue Lisinopril - decrease Toprol to 231m1/2 tablet daily 4. Asymptomatic bradycardia -will cut Toprol back to 1/2 tablet daily  Followup with me in 1 year  Signed, TrFransico HimMD 09/14/2013 1:39 PM

## 2013-09-15 ENCOUNTER — Ambulatory Visit: Payer: Self-pay | Admitting: Cardiology

## 2013-11-16 ENCOUNTER — Telehealth: Payer: Self-pay | Admitting: Cardiology

## 2013-11-16 NOTE — Telephone Encounter (Signed)
Please have patient see his PCP first and consider Urology consult to evaluate for treatable causes of ED

## 2013-11-16 NOTE — Telephone Encounter (Signed)
Confirmed with patient that he has a PCP, as one is not listed in EPIC. Instructed patient to make appointment with his primary physician and to consider Urology consult to evaluate for treatable causes of ED. Patient agrees with treatment plan.

## 2013-11-16 NOTE — Telephone Encounter (Signed)
New Message  Pt called requests a call back to discuss prescribing him a medication... Pt will not disclose the name of the medication. Please call

## 2013-11-16 NOTE — Telephone Encounter (Signed)
Patient asking for a "Viagra-like medication" for erectile dysfunction. Patient st he is on a budget and would like something inexpensive ordered if possible. Informed patient that Dr. Radford Pax is not in the office today, but that this message would be sent to her for review and recommendations.

## 2014-01-24 ENCOUNTER — Other Ambulatory Visit: Payer: Medicare HMO

## 2014-01-25 ENCOUNTER — Other Ambulatory Visit (INDEPENDENT_AMBULATORY_CARE_PROVIDER_SITE_OTHER): Payer: Commercial Managed Care - HMO | Admitting: *Deleted

## 2014-01-25 DIAGNOSIS — E785 Hyperlipidemia, unspecified: Secondary | ICD-10-CM

## 2014-01-25 LAB — ALT: ALT: 16 U/L (ref 0–53)

## 2014-01-25 LAB — LIPID PANEL
CHOL/HDL RATIO: 3
CHOLESTEROL: 134 mg/dL (ref 0–200)
HDL: 41 mg/dL (ref >39.00)
LDL Cholesterol: 68 mg/dL (ref 0–99)
NonHDL: 93
TRIGLYCERIDES: 127 mg/dL (ref 0.0–149.0)
VLDL: 25.4 mg/dL (ref 0.0–40.0)

## 2014-04-09 ENCOUNTER — Other Ambulatory Visit: Payer: Self-pay | Admitting: *Deleted

## 2014-04-09 MED ORDER — LISINOPRIL 5 MG PO TABS: 5.0000 mg | ORAL_TABLET | Freq: Every day | ORAL | Status: DC

## 2014-09-13 ENCOUNTER — Other Ambulatory Visit: Payer: Self-pay | Admitting: *Deleted

## 2014-09-13 MED ORDER — ATORVASTATIN CALCIUM 40 MG PO TABS: 40.0000 mg | ORAL_TABLET | Freq: Every day | ORAL | Status: DC

## 2014-09-19 ENCOUNTER — Other Ambulatory Visit: Payer: Self-pay | Admitting: *Deleted

## 2014-09-19 ENCOUNTER — Other Ambulatory Visit: Payer: Self-pay | Admitting: Cardiology

## 2014-09-19 DIAGNOSIS — E7800 Pure hypercholesterolemia, unspecified: Secondary | ICD-10-CM

## 2014-09-19 DIAGNOSIS — R001 Bradycardia, unspecified: Secondary | ICD-10-CM

## 2014-09-19 DIAGNOSIS — I2510 Atherosclerotic heart disease of native coronary artery without angina pectoris: Secondary | ICD-10-CM

## 2014-09-19 DIAGNOSIS — I10 Essential (primary) hypertension: Secondary | ICD-10-CM

## 2014-09-19 MED ORDER — LISINOPRIL 5 MG PO TABS: 5.0000 mg | ORAL_TABLET | Freq: Every day | ORAL | Status: DC

## 2014-09-19 MED ORDER — METOPROLOL SUCCINATE ER 25 MG PO TB24: 12.5000 mg | ORAL_TABLET | Freq: Every morning | ORAL | Status: DC

## 2014-11-01 ENCOUNTER — Encounter: Payer: Self-pay | Admitting: Cardiology

## 2014-11-01 ENCOUNTER — Ambulatory Visit (INDEPENDENT_AMBULATORY_CARE_PROVIDER_SITE_OTHER): Payer: Commercial Managed Care - HMO | Admitting: Cardiology

## 2014-11-01 VITALS — BP 128/80 | HR 55 | Ht 71.0 in | Wt 207.0 lb

## 2014-11-01 DIAGNOSIS — R001 Bradycardia, unspecified: Secondary | ICD-10-CM | POA: Diagnosis not present

## 2014-11-01 DIAGNOSIS — E7800 Pure hypercholesterolemia, unspecified: Secondary | ICD-10-CM | POA: Diagnosis not present

## 2014-11-01 DIAGNOSIS — I2510 Atherosclerotic heart disease of native coronary artery without angina pectoris: Secondary | ICD-10-CM

## 2014-11-01 DIAGNOSIS — I10 Essential (primary) hypertension: Secondary | ICD-10-CM | POA: Diagnosis not present

## 2014-11-01 NOTE — Patient Instructions (Signed)
Medication Instructions:  Your physician recommends that you continue on your current medications as directed. Please refer to the Current Medication list given to you today.   Labwork: Your physician recommends that you return for FASTING lab work in: January 2017   Testing/Procedures: None  Follow-Up: Your physician wants you to follow-up in: 1 year with Dr. Radford Pax. You will receive a reminder letter in the mail two months in advance. If you don't receive a letter, please call our office to schedule the follow-up appointment.   Any Other Special Instructions Will Be Listed Below (If Applicable).

## 2014-11-01 NOTE — Progress Notes (Addendum)
Cardiology Office Note   Date:  11/01/2014   ID:  ROBERT SPERL, DOB May 29, 1950, MRN [id number]  PCP:  Leonides Sake, MD    Chief Complaint  Patient presents with  . Coronary Artery Disease  . Hypertension      History of Present Illness: Jacob [name] is a 64 y.o. male with a history of multivessel ASCAD s/p CABG, s.p PCI of SVG to RI 2008, dyslipidemia and HTN who presents back today for followup. He is doing well. He denies any chest pain, SOB, DOE, LE edema, palpitations or syncope. He occasionally gets dizziness if he is outside working in the yard feeling off balance.    Past Medical History  Diagnosis Date  . Myocardial infarction ([facility]) 2005  . History of blood transfusion 2005  . Angina pectoris (HCC)     Recurrent, 1/07 stress CL showed normal perfusion  . Dyslipidemia   . Ulcerative colitis ([facility])     Left sided  . Nephrolithiasis   . ED (erectile dysfunction)     Past Surgical History  Procedure Laterality Date  . Hernia repair  many years ago  . Colonoscopy with propofol N/A 10/25/2012    Procedure: COLONOSCOPY WITH PROPOFOL;  Surgeon: Garlan Fair, MD;  Location: [facility];  Service: Endoscopy;  Laterality: N/A;  . Coronary artery bypass graft  2005    ASCVD,multivessel,S/P CABG  . Cardiac catheterization  06/08    Patnet grafts LAD, marginal, distal right  . Knee arthroscopy Right 1995  . Carpel tunnel surgery Left      Current Outpatient Prescriptions  Medication Sig Dispense Refill  . aspirin EC 81 MG tablet Take 1 tablet (81 mg total) by mouth daily. 90 tablet 3  . atorvastatin (LIPITOR) 40 MG tablet TAKE 1 TABLET EVERY DAY ( NEEDS APPOINTMENT FOR FURTHER REFILLS) 90 tablet 0  . lisinopril (PRINIVIL,ZESTRIL) 5 MG tablet TAKE 1 TABLET EVERY DAY 90 tablet 0  . metoprolol succinate (TOPROL-XL) 25 MG 24 hr tablet TAKE 1/2 TABLET EVERY MORNING 45 tablet 0   No current facility-administered medications for this  visit.    Allergies:   Review of patient's allergies indicates no known allergies.    Social History:  The patient  reports that he has never smoked. He has never used smokeless tobacco. He reports that he does not drink alcohol or use illicit drugs.   Family History:  The patient's family history includes CAD in his brother, brother, brother, father, and sister; Ovarian cancer in his mother.    ROS:  Please see the history of present illness.   Otherwise, review of systems are positive for none.   All other systems are reviewed and negative.    PHYSICAL EXAM: VS:  BP 128/80 mmHg  Pulse 55  Ht 5' 11"  (1.803 m)  Wt 207 lb (93.895 kg)  BMI 28.88 kg/m2 , BMI Body mass index is 28.88 kg/(m^2). GEN: Well nourished, well developed, in no acute distress HEENT: normal Neck: no JVD, carotid bruits, or masses Cardiac: RRR; no murmurs, rubs, or gallops,no edema  Respiratory:  clear to auscultation bilaterally, normal work of breathing GI: soft, nontender, nondistended, + BS MS: no deformity or atrophy Skin: warm and dry, no rash Neuro:  Strength and sensation are intact Psych: euthymic mood, full affect   EKG:  EKG was ordered today showing sinus bradycardia at 55bpm with nonspecific T wave abnormality  in I and aVL.     Recent Labs: 01/25/2014: ALT 16    Lipid Panel    Component Value Date/Time   CHOL 134 01/25/2014 0909   TRIG 127.0 01/25/2014 0909   HDL 41.00 01/25/2014 0909   CHOLHDL 3 01/25/2014 0909   CHOLHDL 2.8 01/24/2013 0914   VLDL 25.4 01/25/2014 0909   LDLCALC 68 01/25/2014 0909   LDLDIRECT 71 01/24/2013 0914      Wt Readings from Last 3 Encounters:  11/01/14 207 lb (93.895 kg)  09/14/13 197 lb 12.8 oz (89.721 kg)  10/25/12 213 lb (96.616 kg)    ASSESSMENT AND PLAN:  1. ASCAD s/p CABG 2005 and subsequent DES to SVG to RI with no angina - continue ASA 2. Dyslipidemia - LDL at goal - continue statin - recheck FLP and ALT in 01/2015 3. HTN well  controlled - continue Lisinopril/Toprol 4. Asymptomatic bradycardia     Current medicines are reviewed at length with the patient today.  The patient does not have concerns regarding medicines.  The following changes have been made:  no change  Labs/ tests ordered today: See above Assessment and Plan No orders of the defined types were placed in this encounter.     Disposition:   FU with me in 1 year  Signed, Sueanne Margarita, MD  11/01/2014 11:04 AM    [facility] [place], [place], [place]  [address] Phone: [phone]; Fax: [phone]

## 2014-11-01 NOTE — Addendum Note (Signed)
Addended by: Fransico Him R on: 11/01/2014 11:21 AM   Modules accepted: Miquel Dunn

## 2014-11-16 ENCOUNTER — Other Ambulatory Visit: Payer: Self-pay | Admitting: Cardiology

## 2014-12-03 ENCOUNTER — Other Ambulatory Visit: Payer: Self-pay | Admitting: Cardiology

## 2015-04-15 ENCOUNTER — Other Ambulatory Visit: Payer: Self-pay | Admitting: Cardiology

## 2015-08-28 ENCOUNTER — Other Ambulatory Visit: Payer: Self-pay | Admitting: Cardiology

## 2015-09-11 ENCOUNTER — Telehealth: Payer: Self-pay

## 2015-09-11 NOTE — Telephone Encounter (Signed)
Received handicapped placard paperwork for Dr. Radford Pax to fill out. Per Dr. Radford Pax, patient to have PCP complete paperwork as she will not. He was grateful for assistance.

## 2015-11-06 ENCOUNTER — Encounter (INDEPENDENT_AMBULATORY_CARE_PROVIDER_SITE_OTHER): Payer: Self-pay

## 2015-11-06 ENCOUNTER — Encounter: Payer: Self-pay | Admitting: Cardiology

## 2015-11-06 ENCOUNTER — Ambulatory Visit (INDEPENDENT_AMBULATORY_CARE_PROVIDER_SITE_OTHER): Payer: Commercial Managed Care - HMO | Admitting: Cardiology

## 2015-11-06 ENCOUNTER — Other Ambulatory Visit: Payer: Self-pay | Admitting: Cardiology

## 2015-11-06 VITALS — BP 152/80 | HR 67 | Ht 71.0 in | Wt 215.4 lb

## 2015-11-06 DIAGNOSIS — I2510 Atherosclerotic heart disease of native coronary artery without angina pectoris: Secondary | ICD-10-CM

## 2015-11-06 DIAGNOSIS — I739 Peripheral vascular disease, unspecified: Secondary | ICD-10-CM

## 2015-11-06 DIAGNOSIS — I10 Essential (primary) hypertension: Secondary | ICD-10-CM

## 2015-11-06 DIAGNOSIS — E7800 Pure hypercholesterolemia, unspecified: Secondary | ICD-10-CM | POA: Diagnosis not present

## 2015-11-06 NOTE — Patient Instructions (Signed)
Medication Instructions:  Your physician recommends that you continue on your current medications as directed. Please refer to the Current Medication list given to you today.   Labwork: Your physician recommends that you return for FASTING lab work.   Testing/Procedures: Your physician has requested that you have a lower extremity arterial duplex. During this test, ultrasound is used to evaluate arterial blood flow in the legs. Allow one hour for this exam. There are no restrictions or special instructions.  Follow-Up: Your physician wants you to follow-up in: 1 year with Dr. Radford Pax. You will receive a reminder letter in the mail two months in advance. If you don't receive a letter, please call our office to schedule the follow-up appointment.   Any Other Special Instructions Will Be Listed Below (If Applicable).     If you need a refill on your cardiac medications before your next appointment, please call your pharmacy.

## 2015-11-06 NOTE — Progress Notes (Addendum)
Cardiology Office Note    Date:  11/06/2015   ID:  [name], DOB 1950/05/01, MRN [id number]  PCP:  Leonides Sake, MD  Cardiologist:  Fransico Him, MD   Chief Complaint  Patient presents with  . Coronary Artery Disease  . Hypertension  . Hyperlipidemia    History of Present Illness:  Jacob [name] is a 65 y.o. male with a history of multivessel ASCAD s/p CABG and s/p PCI of SVG to RI 2008, dyslipidemia and HTN who presents back today for followup. He is doing well. He denies any chest pain, SOB, DOE ( with prolonged walking in the heat), LE edema, palpitations or syncope. He occasionally gets dizziness if he is outside working in the yard feeling off balance but no lightheadedness.  He has noticed cramps in his legs when walking up a hill and at night.     Past Medical History:  Diagnosis Date  . Angina pectoris (HCC)    Recurrent, 1/07 stress CL showed normal perfusion  . Dyslipidemia   . ED (erectile dysfunction)   . History of blood transfusion 2005  . Myocardial infarction 2005  . Nephrolithiasis   . Ulcerative colitis ([facility])    Left sided    Past Surgical History:  Procedure Laterality Date  . CARDIAC CATHETERIZATION  06/08   Patnet grafts LAD, marginal, distal right  . carpel tunnel surgery Left   . COLONOSCOPY WITH PROPOFOL N/A 10/25/2012   Procedure: COLONOSCOPY WITH PROPOFOL;  Surgeon: Jacob Fair, MD;  Location: [facility];  Service: Endoscopy;  Laterality: N/A;  . CORONARY ARTERY BYPASS GRAFT  2005   ASCVD,multivessel,S/P CABG  . HERNIA REPAIR  many years ago  . KNEE ARTHROSCOPY Right 1995    Current Medications: Outpatient Medications Prior to Visit  Medication Sig Dispense Refill  . aspirin EC 81 MG tablet Take 1 tablet (81 mg total) by mouth daily. 90 tablet 3  . atorvastatin (LIPITOR) 40 MG tablet Take 1 tablet (40 mg total) by mouth daily. Please call and schedule a one year follow up appointment 90 tablet 0  . lisinopril  (PRINIVIL,ZESTRIL) 5 MG tablet Take 1 tablet (5 mg total) by mouth daily. Please call and schedule a one year follow up appointment 90 tablet 0  . metoprolol succinate (TOPROL-XL) 25 MG 24 hr tablet Take 0.5 tablets (12.5 mg total) by mouth daily. Please call and schedule a one year follow up appointment 45 tablet 0   No facility-administered medications prior to visit.      Allergies:   Review of patient's allergies indicates no known allergies.   Social History   Social History  . Marital status: Married    Spouse name: N/A  . Number of children: N/A  . Years of education: N/A   Social History Main Topics  . Smoking status: Never Smoker  . Smokeless tobacco: Never Used  . Alcohol use No  . Drug use: No  . Sexual activity: Not Asked   Other Topics Concern  . None   Social History Narrative  . None     Family History:  The patient's family history includes CAD in his brother, brother, brother, father, and sister; Ovarian cancer in his mother.   ROS:   Please see the history of present illness.    ROS All other systems reviewed and are negative.  No flowsheet data found.     PHYSICAL EXAM:   VS:  BP (!) 152/80   Pulse 67  Ht 5' 11"  (1.803 m)   Wt 215 lb 6.4 oz (97.7 kg)   BMI 30.04 kg/m    GEN: Well nourished, well developed, in no acute distress  HEENT: normal  Neck: no JVD, carotid bruits, or masses Cardiac: RRR; no murmurs, rubs, or gallops,no edema.  Intact distal pulses bilaterally.  Respiratory:  clear to auscultation bilaterally, normal work of breathing GI: soft, nontender, nondistended, + BS MS: no deformity or atrophy  Skin: warm and dry, no rash Neuro:  Alert and Oriented x 3, Strength and sensation are intact Psych: euthymic mood, full affect  Wt Readings from Last 3 Encounters:  11/06/15 215 lb 6.4 oz (97.7 kg)  11/01/14 207 lb (93.9 kg)  09/14/13 197 lb 12.8 oz (89.7 kg)      Studies/Labs Reviewed:   EKG:  EKG is ordered today and  showed NSR with T wave abnormality in I and aVL unchanged from 2016  Recent Labs: No results found for requested labs within last 8760 hours.   Lipid Panel    Component Value Date/Time   CHOL 134 01/25/2014 0909   TRIG 127.0 01/25/2014 0909   HDL 41.00 01/25/2014 0909   CHOLHDL 3 01/25/2014 0909   VLDL 25.4 01/25/2014 0909   LDLCALC 68 01/25/2014 0909   LDLDIRECT 71 01/24/2013 0914    Additional studies/ records that were reviewed today include:  none    ASSESSMENT:    1. Atherosclerosis of native coronary artery of native heart without angina pectoris   2. Essential hypertension, benign   3. Pure hypercholesterolemia   4. Claudication [facility])      PLAN:  In order of problems listed above:  1. ASCAD s/p remote CABG and PCI of SVG to RI with no angina.  COntinue ASA/statin and BB. 2. HTN - BP borderline controlled on current meds.  I will get a 24 hour BP monitor to evaluate his BP.  Continue ACE I and BB. 3. Hyperlipidemia with LDL goal < 70. Continue statin.  Check FLP and ALT.  4. Claudication - he is complaining of cramps in his calves with exercise but has fairly good pulses.  I will get LE ABIs    Medication Adjustments/Labs and Tests Ordered: Current medicines are reviewed at length with the patient today.  Concerns regarding medicines are outlined above.  Medication changes, Labs and Tests ordered today are listed in the Patient Instructions below.  There are no Patient Instructions on file for this visit.   Signed, Fransico Him, MD  11/06/2015 10:09 AM    [facility] [address], [place], [place]  [address] Phone: [phone]; Fax: [phone]

## 2015-11-07 ENCOUNTER — Other Ambulatory Visit: Payer: Commercial Managed Care - HMO | Admitting: *Deleted

## 2015-11-07 DIAGNOSIS — I10 Essential (primary) hypertension: Secondary | ICD-10-CM

## 2015-11-07 DIAGNOSIS — E7800 Pure hypercholesterolemia, unspecified: Secondary | ICD-10-CM

## 2015-11-07 LAB — LIPID PANEL
CHOLESTEROL: 120 mg/dL — AB (ref 125–200)
HDL: 53 mg/dL (ref >=40)
LDL Cholesterol: 51 mg/dL (ref <130)
TRIGLYCERIDES: 80 mg/dL (ref <150)
Total CHOL/HDL Ratio: 2.3 Ratio (ref <=5.0)
VLDL: 16 mg/dL (ref <30)

## 2015-11-07 LAB — BASIC METABOLIC PANEL
BUN: 13 mg/dL (ref 7–25)
CHLORIDE: 105 mmol/L (ref 98–110)
CO2: 28 mmol/L (ref 20–31)
CREATININE: 1.15 mg/dL (ref 0.70–1.25)
Calcium: 8.9 mg/dL (ref 8.6–10.3)
Glucose, Bld: 95 mg/dL (ref 65–99)
Potassium: 4.5 mmol/L (ref 3.5–5.3)
Sodium: 141 mmol/L (ref 135–146)

## 2015-11-07 LAB — HEPATIC FUNCTION PANEL
ALT: 21 U/L (ref 9–46)
AST: 19 U/L (ref 10–35)
Albumin: 4 g/dL (ref 3.6–5.1)
Alkaline Phosphatase: 70 U/L (ref 40–115)
Bilirubin, Direct: 0.1 mg/dL (ref <=0.2)
Indirect Bilirubin: 0.5 mg/dL (ref 0.2–1.2)
TOTAL PROTEIN: 6.8 g/dL (ref 6.1–8.1)
Total Bilirubin: 0.6 mg/dL (ref 0.2–1.2)

## 2015-11-13 ENCOUNTER — Ambulatory Visit (HOSPITAL_COMMUNITY)
Admission: RE | Admit: 2015-11-13 | Discharge: 2015-11-13 | Disposition: A | Discharge status: Home / Self Care | Payer: Commercial Managed Care - HMO | Source: Ambulatory Visit | Attending: Cardiology | Admitting: Cardiology

## 2015-11-13 DIAGNOSIS — I739 Peripheral vascular disease, unspecified: Secondary | ICD-10-CM

## 2015-11-13 DIAGNOSIS — Z951 Presence of aortocoronary bypass graft: Secondary | ICD-10-CM | POA: Insufficient documentation

## 2015-11-13 DIAGNOSIS — E785 Hyperlipidemia, unspecified: Secondary | ICD-10-CM | POA: Diagnosis not present

## 2015-11-13 DIAGNOSIS — I2510 Atherosclerotic heart disease of native coronary artery without angina pectoris: Secondary | ICD-10-CM | POA: Insufficient documentation

## 2015-11-15 ENCOUNTER — Telehealth: Payer: Self-pay | Admitting: Cardiology

## 2015-11-15 NOTE — Telephone Encounter (Signed)
New message    Pt verbalized that he is returning rn call

## 2015-11-15 NOTE — Telephone Encounter (Signed)
-----   Message from Sueanne Margarita, MD sent at 11/10/2015  4:57 PM EDT ----- Please let patient know that labs were normal.  Continue current medical therapy.

## 2015-11-15 NOTE — Telephone Encounter (Signed)
-----   Message from Sueanne Margarita, MD sent at 11/15/2015  7:57 AM EDT ----- Normal ABIs

## 2015-11-15 NOTE — Telephone Encounter (Signed)
Informed patient of results and verbal understanding expressed.  

## 2016-02-17 ENCOUNTER — Other Ambulatory Visit: Payer: Self-pay | Admitting: *Deleted

## 2016-02-17 MED ORDER — LISINOPRIL 5 MG PO TABS: 5.0000 mg | ORAL_TABLET | Freq: Every day | ORAL | 2 refills | Status: DC

## 2016-02-17 MED ORDER — ATORVASTATIN CALCIUM 40 MG PO TABS: 40.0000 mg | ORAL_TABLET | Freq: Every day | ORAL | 2 refills | Status: DC

## 2016-02-17 MED ORDER — METOPROLOL SUCCINATE ER 25 MG PO TB24: 12.5000 mg | ORAL_TABLET | Freq: Every day | ORAL | 2 refills | Status: DC

## 2016-02-24 DIAGNOSIS — J019 Acute sinusitis, unspecified: Secondary | ICD-10-CM | POA: Diagnosis not present

## 2016-05-01 DIAGNOSIS — Z79899 Other long term (current) drug therapy: Secondary | ICD-10-CM | POA: Diagnosis not present

## 2016-05-01 DIAGNOSIS — Z7982 Long term (current) use of aspirin: Secondary | ICD-10-CM | POA: Diagnosis not present

## 2016-05-01 DIAGNOSIS — E7800 Pure hypercholesterolemia, unspecified: Secondary | ICD-10-CM | POA: Diagnosis not present

## 2016-05-01 DIAGNOSIS — Z6829 Body mass index (BMI) 29.0-29.9, adult: Secondary | ICD-10-CM | POA: Diagnosis not present

## 2016-05-01 DIAGNOSIS — I252 Old myocardial infarction: Secondary | ICD-10-CM | POA: Diagnosis not present

## 2016-05-01 DIAGNOSIS — Z0000 Encounter for general adult medical examination without abnormal findings: Secondary | ICD-10-CM | POA: Diagnosis not present

## 2016-05-01 DIAGNOSIS — I10 Essential (primary) hypertension: Secondary | ICD-10-CM | POA: Diagnosis not present

## 2016-06-16 ENCOUNTER — Observation Stay (HOSPITAL_BASED_OUTPATIENT_CLINIC_OR_DEPARTMENT_OTHER)
Admit: 2016-06-16 | Discharge: 2016-06-16 | Disposition: A | Discharge status: Home / Self Care | Payer: Medicare HMO | Attending: Internal Medicine | Admitting: Internal Medicine

## 2016-06-16 ENCOUNTER — Emergency Department: Payer: Medicare HMO

## 2016-06-16 ENCOUNTER — Observation Stay
Admission: EM | Admit: 2016-06-16 | Discharge: 2016-06-17 | Disposition: A | Discharge status: Home / Self Care | Payer: Medicare HMO | Attending: Internal Medicine | Admitting: Internal Medicine

## 2016-06-16 ENCOUNTER — Encounter: Payer: Self-pay | Admitting: Emergency Medicine

## 2016-06-16 DIAGNOSIS — R410 Disorientation, unspecified: Secondary | ICD-10-CM | POA: Diagnosis not present

## 2016-06-16 DIAGNOSIS — I6529 Occlusion and stenosis of unspecified carotid artery: Secondary | ICD-10-CM | POA: Diagnosis not present

## 2016-06-16 DIAGNOSIS — G459 Transient cerebral ischemic attack, unspecified: Secondary | ICD-10-CM

## 2016-06-16 DIAGNOSIS — I6523 Occlusion and stenosis of bilateral carotid arteries: Secondary | ICD-10-CM | POA: Insufficient documentation

## 2016-06-16 DIAGNOSIS — I639 Cerebral infarction, unspecified: Principal | ICD-10-CM | POA: Insufficient documentation

## 2016-06-16 DIAGNOSIS — Z79899 Other long term (current) drug therapy: Secondary | ICD-10-CM | POA: Insufficient documentation

## 2016-06-16 DIAGNOSIS — I252 Old myocardial infarction: Secondary | ICD-10-CM | POA: Diagnosis not present

## 2016-06-16 DIAGNOSIS — Z955 Presence of coronary angioplasty implant and graft: Secondary | ICD-10-CM | POA: Insufficient documentation

## 2016-06-16 DIAGNOSIS — I2510 Atherosclerotic heart disease of native coronary artery without angina pectoris: Secondary | ICD-10-CM | POA: Insufficient documentation

## 2016-06-16 DIAGNOSIS — E7800 Pure hypercholesterolemia, unspecified: Secondary | ICD-10-CM | POA: Diagnosis not present

## 2016-06-16 DIAGNOSIS — I340 Nonrheumatic mitral (valve) insufficiency: Secondary | ICD-10-CM

## 2016-06-16 DIAGNOSIS — I10 Essential (primary) hypertension: Secondary | ICD-10-CM | POA: Diagnosis not present

## 2016-06-16 DIAGNOSIS — Z951 Presence of aortocoronary bypass graft: Secondary | ICD-10-CM | POA: Insufficient documentation

## 2016-06-16 DIAGNOSIS — I63233 Cerebral infarction due to unspecified occlusion or stenosis of bilateral carotid arteries: Secondary | ICD-10-CM | POA: Diagnosis not present

## 2016-06-16 DIAGNOSIS — R42 Dizziness and giddiness: Secondary | ICD-10-CM | POA: Diagnosis not present

## 2016-06-16 DIAGNOSIS — I6522 Occlusion and stenosis of left carotid artery: Secondary | ICD-10-CM | POA: Diagnosis not present

## 2016-06-16 DIAGNOSIS — R51 Headache: Secondary | ICD-10-CM | POA: Diagnosis not present

## 2016-06-16 HISTORY — DX: Essential (primary) hypertension: I10

## 2016-06-16 LAB — COMPREHENSIVE METABOLIC PANEL
ALT: 20 U/L (ref 17–63)
ANION GAP: 7 (ref 5–15)
AST: 23 U/L (ref 15–41)
Albumin: 4.2 g/dL (ref 3.5–5.0)
Alkaline Phosphatase: 79 U/L (ref 38–126)
BUN: 12 mg/dL (ref 6–20)
CALCIUM: 9.1 mg/dL (ref 8.9–10.3)
CO2: 27 mmol/L (ref 22–32)
CREATININE: 1.13 mg/dL (ref 0.61–1.24)
Chloride: 106 mmol/L (ref 101–111)
Glucose, Bld: 95 mg/dL (ref 65–99)
Potassium: 3.9 mmol/L (ref 3.5–5.1)
Sodium: 140 mmol/L (ref 135–145)
Total Bilirubin: 0.8 mg/dL (ref 0.3–1.2)
Total Protein: 7.5 g/dL (ref 6.5–8.1)

## 2016-06-16 LAB — CBC
HCT: 38.7 % — ABNORMAL LOW (ref 40.0–52.0)
Hemoglobin: 13.4 g/dL (ref 13.0–18.0)
MCH: 29.8 pg (ref 26.0–34.0)
MCHC: 34.5 g/dL (ref 32.0–36.0)
MCV: 86.2 fL (ref 80.0–100.0)
PLATELETS: 177 10*3/uL (ref 150–440)
RBC: 4.49 MIL/uL (ref 4.40–5.90)
RDW: 13.1 % (ref 11.5–14.5)
WBC: 4.6 10*3/uL (ref 3.8–10.6)

## 2016-06-16 LAB — URINALYSIS, ROUTINE W REFLEX MICROSCOPIC
BILIRUBIN URINE: NEGATIVE
Glucose, UA: NEGATIVE mg/dL
HGB URINE DIPSTICK: NEGATIVE
Ketones, ur: NEGATIVE mg/dL
Leukocytes, UA: NEGATIVE
Nitrite: NEGATIVE
PROTEIN: NEGATIVE mg/dL
Specific Gravity, Urine: 1.005 (ref 1.005–1.030)
pH: 7 (ref 5.0–8.0)

## 2016-06-16 LAB — TROPONIN I

## 2016-06-16 MED ORDER — ENOXAPARIN SODIUM 40 MG/0.4ML ~~LOC~~ SOLN
40.0000 mg | SUBCUTANEOUS | Status: DC
  Administered 2016-06-16: 22:00:00 40 mg via SUBCUTANEOUS
  Filled 2016-06-16: qty 0.4

## 2016-06-16 MED ORDER — ONDANSETRON HCL 4 MG PO TABS: 4.0000 mg | ORAL_TABLET | Freq: Four times a day (QID) | ORAL | Status: DC | PRN

## 2016-06-16 MED ORDER — ACETAMINOPHEN 325 MG PO TABS: 650.0000 mg | ORAL_TABLET | Freq: Four times a day (QID) | ORAL | Status: DC | PRN

## 2016-06-16 MED ORDER — METOPROLOL SUCCINATE ER 25 MG PO TB24
12.5000 mg | ORAL_TABLET | Freq: Every day | ORAL | Status: DC
  Administered 2016-06-17: 12.5 mg via ORAL
  Filled 2016-06-16 (×2): qty 1

## 2016-06-16 MED ORDER — IOPAMIDOL (ISOVUE-370) INJECTION 76%
75.0000 mL | Freq: Once | INTRAVENOUS | Status: AC | PRN
  Administered 2016-06-16: 75 mL via INTRAVENOUS

## 2016-06-16 MED ORDER — LISINOPRIL 5 MG PO TABS
5.0000 mg | ORAL_TABLET | Freq: Every day | ORAL | Status: DC
  Administered 2016-06-17: 5 mg via ORAL
  Filled 2016-06-16: qty 1

## 2016-06-16 MED ORDER — ATORVASTATIN CALCIUM 20 MG PO TABS: 40.0000 mg | ORAL_TABLET | Freq: Every day | ORAL | Status: DC

## 2016-06-16 MED ORDER — ONDANSETRON HCL 4 MG/2ML IJ SOLN: 4.0000 mg | Freq: Four times a day (QID) | INTRAMUSCULAR | Status: DC | PRN

## 2016-06-16 MED ORDER — ACETAMINOPHEN 650 MG RE SUPP: 650.0000 mg | Freq: Four times a day (QID) | RECTAL | Status: DC | PRN

## 2016-06-16 MED ORDER — POLYETHYLENE GLYCOL 3350 17 G PO PACK: 17.0000 g | PACK | Freq: Every day | ORAL | Status: DC | PRN

## 2016-06-16 MED ORDER — ASPIRIN EC 81 MG PO TBEC
81.0000 mg | DELAYED_RELEASE_TABLET | Freq: Every day | ORAL | Status: DC
  Administered 2016-06-17: 10:00:00 81 mg via ORAL
  Filled 2016-06-16: qty 1

## 2016-06-16 MED ORDER — SODIUM CHLORIDE 0.9% FLUSH
3.0000 mL | Freq: Two times a day (BID) | INTRAVENOUS | Status: DC
  Administered 2016-06-16 – 2016-06-17 (×2): 3 mL via INTRAVENOUS

## 2016-06-16 NOTE — ED Provider Notes (Signed)
[facility] Emergency Department Provider Note  Time seen: 9:31 AM  I have reviewed the triage vital signs and the nursing notes.   HISTORY  Chief Complaint Dizziness    HPI Jacob [name] is a 66 y.o. male with a past medical history of hyperlipidemia, prior MI, hypertension, presents to the emergency department for dizziness and confusion. According to the family on Saturday night the patient was driving but got lost, did not recognize where he was or any of the street names. Wife states this is a very familiar area for the patient. She states he called her crying saying he was lost, which she states is extremely abnormal for the patient. She also states for the past 2 months the patient has been complaining of headaches. For the past several weeks he has been stumbling at times, appears to be off balance. Patient admits to the headache, some confusion at times. Wife states he is much better than he was on Saturday however he continues to act somewhat confused which she describes as him staring off into space at times and being less interactive. Patient states mild to moderate headache currently. On review systems the patient does state urinary frequency, but otherwise negative review of systems.  Past Medical History:  Diagnosis Date  . Angina pectoris (HCC)    Recurrent, 1/07 stress CL showed normal perfusion  . Dyslipidemia   . ED (erectile dysfunction)   . History of blood transfusion 2005  . Myocardial infarction ([place]) 2005  . Nephrolithiasis   . Ulcerative colitis ([place])    Left sided    Patient Active Problem List   Diagnosis Date Noted  . Coronary atherosclerosis of native coronary artery 09/14/2013  . Pure hypercholesterolemia 09/14/2013  . Essential hypertension, benign 09/14/2013  . Bradycardia, sinus 09/14/2013    Past Surgical History:  Procedure Laterality Date  . CARDIAC CATHETERIZATION  06/08   Patnet grafts LAD, marginal, distal  right  . carpel tunnel surgery Left   . COLONOSCOPY WITH PROPOFOL N/A 10/25/2012   Procedure: COLONOSCOPY WITH PROPOFOL;  Surgeon: Garlan Fair, MD;  Location: [facility];  Service: Endoscopy;  Laterality: N/A;  . CORONARY ARTERY BYPASS GRAFT  2005   ASCVD,multivessel,S/P CABG  . HERNIA REPAIR  many years ago  . KNEE ARTHROSCOPY Right 1995    Prior to Admission medications   Medication Sig Start Date End Date Taking? Authorizing Provider  aspirin EC 81 MG tablet Take 1 tablet (81 mg total) by mouth daily. 09/14/13   Sueanne Margarita, MD  atorvastatin (LIPITOR) 40 MG tablet Take 1 tablet (40 mg total) by mouth daily. 02/17/16   Sueanne Margarita, MD  lisinopril (PRINIVIL,ZESTRIL) 5 MG tablet Take 1 tablet (5 mg total) by mouth daily. 02/17/16   Sueanne Margarita, MD  metoprolol succinate (TOPROL-XL) 25 MG 24 hr tablet Take 0.5 tablets (12.5 mg total) by mouth daily. 02/17/16   Sueanne Margarita, MD    No Known Allergies  Family History  Problem Relation Age of Onset  . Ovarian cancer Mother   . CAD Father   . CAD Sister   . CAD Brother   . CAD Brother   . CAD Brother     Social History Social History  Substance Use Topics  . Smoking status: Never Smoker  . Smokeless tobacco: Never Used  . Alcohol use No    Review of Systems Constitutional: Negative for fever.Occasional dizziness/off-balance sensation. Eyes: Negative for visual changes. ENT: Negative for  congestion Cardiovascular: Negative for chest pain. Respiratory: Negative for shortness of breath. Gastrointestinal: Negative for abdominal pain, vomiting and diarrhea. Genitourinary: Negative for dysuria. Positive for urinary frequency Musculoskeletal: Negative for back pain. Neurological: Moderate headache. Denies weakness or numbness. All other ROS negative  ____________________________________________   PHYSICAL EXAM:  VITAL SIGNS: ED Triage Vitals  Enc Vitals Group     BP 06/16/16 0901 (!) 158/81     Pulse Rate  06/16/16 0901 (!) 57     Resp 06/16/16 0901 16     Temp 06/16/16 0901 98.3 F (36.8 C)     Temp Source 06/16/16 0901 Oral     SpO2 06/16/16 0901 100 %     Weight 06/16/16 0900 210 lb (95.3 kg)     Height 06/16/16 0900 6' (1.829 m)     Head Circumference --      Peak Flow --      Pain Score 06/16/16 0900 8     Pain Loc --      Pain Edu? --      Excl. in [place]? --     Constitutional: Alert and oriented. Well appearing and in no distress. Eyes: Normal exam ENT   Head: Normocephalic and atraumatic.   Mouth/Throat: Mucous membranes are moist. Cardiovascular: Normal rate, regular rhythm. No murmur Respiratory: Normal respiratory effort without tachypnea nor retractions. Breath sounds are clear and equal bilaterally. No wheezes/rales/rhonchi. Gastrointestinal: Soft and nontender. No distention.   Musculoskeletal: Nontender with normal range of motion in all extremities.  Neurologic:  Normal speech and language. No gross focal neurologic deficits. Equal grip strengths. No pronator drift. Cranial nerves intact. 5/5 motor in all extremities. Finger to nose testing intact. Skin:  Skin is warm, dry and intact.  Psychiatric: Mood and affect are normal. Speech and behavior are normal.   ____________________________________________    EKG  EKG reviewed and interpreted by myself shows normal sinus rhythm at 58 bpm, narrow QRS, normal axis, normal intervals, no concerning ST changes.  ____________________________________________    RADIOLOGY  IMPRESSION: 1. No acute intracranial process identified. 2. Abnormal flow void within the right ICA to the level of the terminus, which may be related to slow flow and/or occlusion. 3. Otherwise normal brain MRI for age.  ____________________________________________   INITIAL IMPRESSION / ASSESSMENT AND PLAN / ED COURSE  Pertinent labs & imaging results that were available during my care of the patient were reviewed by me and considered in  my medical decision making (see chart for details).  The patient presents to the emergency department for dizziness, confusion and headache. Overall the patient appears well with a normal neurological exam. No deficits identified. We will check labs, CT scan of the head, and closely monitor. EKG is within normal limits. Patient agreeable with this plan.  MRI of the brain is essentially normal besides low flow in the right ICA. Given the patient's abnormalities on MRI I discussed the patient with neurology, Dr. Doy Mince. Given the patient's ongoing confusion with TIA symptoms on Saturday she recommends admission for further workup. We'll obtain a CT angiography of the neck and admit the patient for further workup.  ____________________________________________   FINAL CLINICAL IMPRESSION(S) / ED DIAGNOSES  Confusion TIA   Harvest Dark, MD 06/16/16 1455

## 2016-06-16 NOTE — H&P (Signed)
[facility] NAME: Jacob [name]    MR#:  [id number]  DATE OF BIRTH:  02/05/1950  DATE OF ADMISSION:  06/16/2016  PRIMARY CARE PHYSICIAN: Hamrick, Lorin Mercy, MD   REQUESTING/REFERRING PHYSICIAN: Dr. Kerman Passey  CHIEF COMPLAINT:   Chief Complaint  Patient presents with  . Dizziness    HISTORY OF PRESENT ILLNESS:  Jacob [name]  is a 66 y.o. male with a known history of Retention, CAD status post CABG presents to the emergency room brought in by his wife after she noticed that he has had periods of confusion transiently. Patient has had an on and off headache on the left frontal area for 2 months. A few days back he called his wife when he felt like he was lost driving in a very familiar place. She has noticed on and off confusion and staring off into space recently.. The emergency room and MRI of the brain was checked which showed no acute findings other than some possible narrowing or occlusion of the left carotid artery. Patient is being admitted for possible TIA and further workup after consulted with neurology Dr. Doy Mince. No recent change in medications. No falls. No focal weakness or dizziness. No change in vision. No seizures or incontinence  PAST MEDICAL HISTORY:   Past Medical History:  Diagnosis Date  . Angina pectoris (HCC)    Recurrent, 1/07 stress CL showed normal perfusion  . Dyslipidemia   . ED (erectile dysfunction)   . History of blood transfusion 2005  . Hypertension   . Myocardial infarction ([place]) 2005  . Nephrolithiasis   . Ulcerative colitis ([place])    Left sided    PAST SURGICAL HISTORY:   Past Surgical History:  Procedure Laterality Date  . CARDIAC CATHETERIZATION  06/08   Patnet grafts LAD, marginal, distal right  . carpel tunnel surgery Left   . COLONOSCOPY WITH PROPOFOL N/A 10/25/2012   Procedure: COLONOSCOPY WITH PROPOFOL;  Surgeon: Garlan Fair, MD;  Location: [facility];  Service:  Endoscopy;  Laterality: N/A;  . CORONARY ARTERY BYPASS GRAFT  2005   ASCVD,multivessel,S/P CABG  . HERNIA REPAIR  many years ago  . KNEE ARTHROSCOPY Right 1995    SOCIAL HISTORY:   Social History  Substance Use Topics  . Smoking status: Never Smoker  . Smokeless tobacco: Never Used  . Alcohol use No    FAMILY HISTORY:   Family History  Problem Relation Age of Onset  . Ovarian cancer Mother   . CAD Father   . CAD Sister   . CAD Brother   . CAD Brother   . CAD Brother     DRUG ALLERGIES:  No Known Allergies  REVIEW OF SYSTEMS:   Review of Systems  Constitutional: Positive for malaise/fatigue. Negative for chills, fever and weight loss.  HENT: Negative for hearing loss and nosebleeds.   Eyes: Negative for blurred vision, double vision and pain.  Respiratory: Negative for cough, hemoptysis, sputum production, shortness of breath and wheezing.   Cardiovascular: Negative for chest pain, palpitations, orthopnea and leg swelling.  Gastrointestinal: Negative for abdominal pain, constipation, diarrhea, nausea and vomiting.  Genitourinary: Negative for dysuria and hematuria.  Musculoskeletal: Negative for back pain, falls and myalgias.  Skin: Negative for rash.  Neurological: Negative for dizziness, tremors, sensory change, speech change, focal weakness, seizures and headaches.  Endo/Heme/Allergies: Does not bruise/bleed easily.  Psychiatric/Behavioral: Positive for memory loss. Negative for depression. The patient is not nervous/anxious.  MEDICATIONS AT HOME:   Prior to Admission medications   Medication Sig Start Date End Date Taking? Authorizing Provider  aspirin EC 81 MG tablet Take 1 tablet (81 mg total) by mouth daily. 09/14/13  Yes Turner, Eber Hong, MD  atorvastatin (LIPITOR) 40 MG tablet Take 1 tablet (40 mg total) by mouth daily. 02/17/16  Yes Turner, Eber Hong, MD  lisinopril (PRINIVIL,ZESTRIL) 5 MG tablet Take 1 tablet (5 mg total) by mouth daily. 02/17/16  Yes  Turner, Eber Hong, MD  metoprolol succinate (TOPROL-XL) 25 MG 24 hr tablet Take 0.5 tablets (12.5 mg total) by mouth daily. 02/17/16  Yes Turner, Eber Hong, MD     VITAL SIGNS:  Blood pressure 138/83, pulse (!) 53, temperature 98.3 F (36.8 C), temperature source Oral, resp. rate 17, height 6' (1.829 m), weight 95.3 kg (210 lb), SpO2 97 %.  PHYSICAL EXAMINATION:  Physical Exam  GENERAL:  66 y.o.-year-old patient lying in the bed with no acute distress.  EYES: Pupils equal, round, reactive to light and accommodation. No scleral icterus. Extraocular muscles intact.  HEENT: Head atraumatic, normocephalic. Oropharynx and nasopharynx clear. No oropharyngeal erythema, moist oral mucosa  NECK:  Supple, no jugular venous distention. No thyroid enlargement, no tenderness.  LUNGS: Normal breath sounds bilaterally, no wheezing, rales, rhonchi. No use of accessory muscles of respiration.  CARDIOVASCULAR: S1, S2 normal. No murmurs, rubs, or gallops.  ABDOMEN: Soft, nontender, nondistended. Bowel sounds present. No organomegaly or mass.  EXTREMITIES: No pedal edema, cyanosis, or clubbing. + 2 pedal & radial pulses b/l.   NEUROLOGIC: Cranial nerves II through XII are intact. No focal Motor or sensory deficits appreciated b/l PSYCHIATRIC: The patient is alert and oriented x 3. Good affect.  SKIN: No obvious rash, lesion, or ulcer.   LABORATORY PANEL:   CBC  Recent Labs Lab 06/16/16 1034  WBC 4.6  HGB 13.4  HCT 38.7*  PLT 177   ------------------------------------------------------------------------------------------------------------------  Chemistries   Recent Labs Lab 06/16/16 1034  NA 140  K 3.9  CL 106  CO2 27  GLUCOSE 95  BUN 12  CREATININE 1.13  CALCIUM 9.1  AST 23  ALT 20  ALKPHOS 79  BILITOT 0.8   ------------------------------------------------------------------------------------------------------------------  Cardiac Enzymes  Recent Labs Lab 06/16/16 1034  TROPONINI  <0.03   ------------------------------------------------------------------------------------------------------------------  RADIOLOGY:  Ct Head Wo Contrast  Result Date: 06/16/2016 CLINICAL DATA:  Headache, dizziness. EXAM: CT HEAD WITHOUT CONTRAST TECHNIQUE: Contiguous axial images were obtained from the base of the skull through the vertex without intravenous contrast. COMPARISON:  None. FINDINGS: Brain: No evidence of acute infarction, hemorrhage, hydrocephalus, extra-axial collection or mass lesion/mass effect. Vascular: Atherosclerosis of carotid siphons is noted. Skull: Normal. Negative for fracture or focal lesion. Sinuses/Orbits: No acute finding. Other: None. IMPRESSION: No acute intracranial abnormality seen. Electronically Signed   By: Marijo Conception, M.D.   On: 06/16/2016 10:13   Mr Brain Wo Contrast  Result Date: 06/16/2016 CLINICAL DATA:  Initial evaluation for acute confusion. EXAM: MRI HEAD WITHOUT CONTRAST TECHNIQUE: Multiplanar, multiecho pulse sequences of the brain and surrounding structures were obtained without intravenous contrast. COMPARISON:  Prior CT from earlier the same day. FINDINGS: Brain: Age-appropriate cerebral atrophy. Few scattered patchy T2/FLAIR hyperintense foci noted within the periventricular and deep white matter both cerebral hemispheres, nonspecific, but most like related chronic small vessel ischemic disease. Overall, changes felt to be within normal limits for age. No abnormal foci of restricted diffusion to suggest acute or subacute ischemia. Gray-white matter differentiation maintained.  No encephalomalacia to suggest chronic infarction. Single punctate focus of susceptibility artifact at the posterior right parietal lobe, of doubtful significance in isolation. No other evidence for acute or chronic intracranial hemorrhage. No mass lesion, midline shift or mass effect. No hydrocephalus. No extra-axial fluid collection. Major dural sinuses are grossly patent.  Pituitary gland suprasellar region within normal limits. Midline structures intact and normal. Vascular: Abnormal flow void within the right ICA to the level of the terminus, which may be related to occlusion and/or slow flow. Intracranial vascular flow voids are otherwise maintained. Normal flow void is seen within the right MCA and its branches. Skull and upper cervical spine: Craniocervical junction within normal limits. Visualized upper cervical spine unremarkable. Bone marrow signal intensity within normal limits. No scalp soft tissue abnormality. Sinuses/Orbits: Globes normal soft tissues within normal limits. Scattered mucosal thickening throughout the paranasal sinuses. Few small superimposed retention cysts noted within left maxillary sinus. No air-fluid level to suggest active sinus infection. No mastoid effusion. Inner ear structures normal. Other: None. IMPRESSION: 1. No acute intracranial process identified. 2. Abnormal flow void within the right ICA to the level of the terminus, which may be related to slow flow and/or occlusion. 3. Otherwise normal brain MRI for age. Electronically Signed   By: Jeannine Boga M.D.   On: 06/16/2016 14:31     IMPRESSION AND PLAN:   * TIA - Normal MRI brain except some narrowing of ICA. Check CTA neck. Echo. - Start aspirin and statin. - Lovenox for DVT prophylaxis. - PT/OT/Speech consult as needed per symptoms - Consult neurology.  * HTN Toprol and lisinopril  * CAD s/p CABG is stable  * DVT prophylaxis Lovenox   All the records are reviewed and case discussed with ED provider. Management plans discussed with the patient, family and they are in agreement.  CODE STATUS: FULL CODE  TOTAL TIME TAKING CARE OF THIS PATIENT: 40 minutes.   Hillary Bow R M.D on 06/16/2016 at 3:27 PM  Between 7am to 6pm - Pager - [phone]  After 6pm go to [email or web address] - password [email or web address]  Office   [phone]  CC: Primary care physician; Leonides Sake, MD  Note: This dictation was prepared with Dragon dictation along with smaller phrase technology. Any transcriptional errors that result from this process are unintentional.

## 2016-06-16 NOTE — ED Notes (Signed)
This RN to bedside, pt visualized in NAD, pt standing up to give a urine sample at this time. Will continue to monitor for further patient needs.

## 2016-06-16 NOTE — ED Notes (Signed)
Pt given phone to answer questions for MRI. Visualized in NAD.

## 2016-06-16 NOTE — ED Notes (Signed)
Pt taken to MRI by Ronalee Belts, EDT-P.

## 2016-06-16 NOTE — Progress Notes (Signed)
Dr. Darvin Neighbours notified of patient showed this RN a "shingles blister that re-occurs above buttocks every year in June from a previous spider bite". Dr. Darvin Neighbours came to assess patient's blister and does not think it is shingles.

## 2016-06-16 NOTE — ED Triage Notes (Signed)
Wife states that on Saturday night patient was out and "got lost".  C/O left sided headache "for a while" (at least 2 months).  Also having intermittent episodes of dizziness "for a while", but seems to be worse since Saturday.

## 2016-06-16 NOTE — ED Notes (Signed)
Pt states returned from MRI approx 15 mins ago. This RN placed patient back on monitor device. Pt is noted to be alert and oriented, NAD noted at this time.

## 2016-06-16 NOTE — Care Management Obs Status (Signed)
[facility] NOTIFICATION   Patient Details  Name: ROJELIO UHRICH MRN: [id number] Date of Birth: 1950/05/13   Medicare Observation Status Notification Given:  Yes    Beau Fanny, RN 06/16/2016, 3:39 PM

## 2016-06-16 NOTE — ED Notes (Signed)
Pt taken for EEG.

## 2016-06-16 NOTE — ED Notes (Signed)
Pt taken to CT at this time.

## 2016-06-16 NOTE — Progress Notes (Signed)
Signed by spouse at bedside

## 2016-06-17 DIAGNOSIS — I2510 Atherosclerotic heart disease of native coronary artery without angina pectoris: Secondary | ICD-10-CM | POA: Diagnosis not present

## 2016-06-17 DIAGNOSIS — R410 Disorientation, unspecified: Secondary | ICD-10-CM

## 2016-06-17 DIAGNOSIS — I6522 Occlusion and stenosis of left carotid artery: Secondary | ICD-10-CM | POA: Diagnosis not present

## 2016-06-17 DIAGNOSIS — I6529 Occlusion and stenosis of unspecified carotid artery: Secondary | ICD-10-CM | POA: Diagnosis not present

## 2016-06-17 DIAGNOSIS — I10 Essential (primary) hypertension: Secondary | ICD-10-CM | POA: Diagnosis not present

## 2016-06-17 DIAGNOSIS — R42 Dizziness and giddiness: Secondary | ICD-10-CM | POA: Diagnosis not present

## 2016-06-17 DIAGNOSIS — G459 Transient cerebral ischemic attack, unspecified: Secondary | ICD-10-CM | POA: Diagnosis not present

## 2016-06-17 LAB — LIPID PANEL
Cholesterol: 127 mg/dL (ref 0–200)
HDL: 39 mg/dL — ABNORMAL LOW (ref >40)
LDL CALC: 66 mg/dL (ref 0–99)
Total CHOL/HDL Ratio: 3.3 RATIO
Triglycerides: 111 mg/dL (ref <150)
VLDL: 22 mg/dL (ref 0–40)

## 2016-06-17 LAB — ECHOCARDIOGRAM COMPLETE
HEIGHTINCHES: 72 in
Weight: 3360 oz

## 2016-06-17 NOTE — Consult Note (Signed)
Reason for Consult:Episodic confusion Referring Physician: Mody  CC: Episodic confusion, difficulty with gait  HPI: Jacob [name] is an 66 y.o. male who reports that on Sunday he was taking a car trip that he takes usually and got lost.  He had to be led home by someone else.  Patient on arriving home went to bed and reports that he awakened on Monday back to baseline cognitively.  He does report that on Monday he los this balance with gait multiple times. He reports that he has been having difficulty with gait since 2005 after his heart surgery.  He reports that at times he feels as if the ground shifts when he is attempting to get around. He can usually self correct.  Past Medical History:  Diagnosis Date  . Angina pectoris (HCC)    Recurrent, 1/07 stress CL showed normal perfusion  . Dyslipidemia   . ED (erectile dysfunction)   . History of blood transfusion 2005  . Hypertension   . Myocardial infarction ([place]) 2005  . Nephrolithiasis   . Ulcerative colitis ([place])    Left sided    Past Surgical History:  Procedure Laterality Date  . CARDIAC CATHETERIZATION  06/08   Patnet grafts LAD, marginal, distal right  . carpel tunnel surgery Left   . COLONOSCOPY WITH PROPOFOL N/A 10/25/2012   Procedure: COLONOSCOPY WITH PROPOFOL;  Surgeon: Garlan Fair, MD;  Location: [facility];  Service: Endoscopy;  Laterality: N/A;  . CORONARY ARTERY BYPASS GRAFT  2005   ASCVD,multivessel,S/P CABG  . HERNIA REPAIR  many years ago  . KNEE ARTHROSCOPY Right 1995    Family History  Problem Relation Age of Onset  . Ovarian cancer Mother   . CAD Father   . CAD Sister   . CAD Brother   . CAD Brother   . CAD Brother     Social History:  reports that he has never smoked. He has never used smokeless tobacco. He reports that he does not drink alcohol or use drugs.  No Known Allergies  Medications:  I have reviewed the patient's current medications. Prior to Admission:  Prescriptions Prior  to Admission  Medication Sig Dispense Refill Last Dose  . aspirin EC 81 MG tablet Take 1 tablet (81 mg total) by mouth daily. 90 tablet 3 06/16/2016 at 0800  . atorvastatin (LIPITOR) 40 MG tablet Take 1 tablet (40 mg total) by mouth daily. 90 tablet 2 06/16/2016 at 0800  . lisinopril (PRINIVIL,ZESTRIL) 5 MG tablet Take 1 tablet (5 mg total) by mouth daily. 90 tablet 2 06/16/2016 at 0700  . metoprolol succinate (TOPROL-XL) 25 MG 24 hr tablet Take 0.5 tablets (12.5 mg total) by mouth daily. 45 tablet 2 06/16/2016 at 0800   Scheduled: . aspirin EC  81 mg Oral Daily  . atorvastatin  40 mg Oral Daily  . enoxaparin (LOVENOX) injection  40 mg Subcutaneous Q24H  . lisinopril  5 mg Oral Daily  . metoprolol succinate  12.5 mg Oral Daily  . sodium chloride flush  3 mL Intravenous Q12H    ROS: History obtained from the patient  General ROS: negative for - chills, fatigue, fever, night sweats, weight gain or weight loss Psychological ROS: negative for - behavioral disorder, hallucinations, memory difficulties, mood swings or suicidal ideation Ophthalmic ROS: negative for - blurry vision, double vision, eye pain or loss of vision ENT ROS: negative for - epistaxis, nasal discharge, oral lesions, sore throat, tinnitus or vertigo Allergy and Immunology ROS: negative for -  hives or itchy/watery eyes Hematological and Lymphatic ROS: negative for - bleeding problems, bruising or swollen lymph nodes Endocrine ROS: negative for - galactorrhea, hair pattern changes, polydipsia/polyuria or temperature intolerance Respiratory ROS: negative for - cough, hemoptysis, shortness of breath or wheezing Cardiovascular ROS: negative for - chest pain, dyspnea on exertion, edema or irregular heartbeat Gastrointestinal ROS: negative for - abdominal pain, diarrhea, hematemesis, nausea/vomiting or stool incontinence Genito-Urinary ROS: negative for - dysuria, hematuria, incontinence or urinary frequency/urgency Musculoskeletal  ROS: negative for - joint swelling or muscular weakness Neurological ROS: as noted in HPI, intermittent fingertip tingling Dermatological ROS: negative for rash and skin lesion changes  Physical Examination: Blood pressure 126/74, pulse 64, temperature 98.2 F (36.8 C), temperature source Oral, resp. rate 20, height 6' (1.829 m), weight 96.6 kg (213 lb), SpO2 96 %.  HEENT-  Normocephalic, no lesions, without obvious abnormality.  Normal external eye and conjunctiva.  Normal TM's bilaterally.  Normal auditory canals and external ears. Normal external nose, mucus membranes and septum.  Normal pharynx. Cardiovascular- S1, S2 normal, pulses palpable throughout   Lungs- chest clear, no wheezing, rales, normal symmetric air entry Abdomen- soft, non-tender; bowel sounds normal; no masses,  no organomegaly Extremities- no edema Lymph-no adenopathy palpable Musculoskeletal-no joint tenderness, deformity or swelling Skin-warm and dry, no hyperpigmentation, vitiligo, or suspicious lesions  Neurological Examination   Mental Status: Alert, oriented, thought content appropriate.  Speech fluent without evidence of aphasia.  Able to follow 3 step commands without difficulty. Cranial Nerves: II: Discs flat bilaterally; Visual fields grossly normal, pupils equal, round, reactive to light and accommodation III,IV, VI: ptosis not present, extra-ocular motions intact bilaterally V,VII: smile symmetric, facial light touch sensation normal bilaterally VIII: hearing normal bilaterally IX,X: gag reflex present XI: bilateral shoulder shrug XII: midline tongue extension Motor: Right : Upper extremity   5/5    Left:     Upper extremity   5/5  Lower extremity   5/5     Lower extremity   5/5 Tone and bulk:normal tone throughout; no atrophy noted Sensory: Pinprick and light touch intact throughout, bilaterally.  Decreased vibratory sensation to above the knees bilaterally.   Deep Tendon Reflexes: 1+ in the upper  extremities and absent in the lower extremities Plantars: Right: mute   Left: mute Cerebellar: Normal finger-to-nose and normal heel-to-shin testing bilaterally Gait: not tested due to safety concerns    Laboratory Studies:   Basic Metabolic Panel:  Recent Labs Lab 06/16/16 1034  NA 140  K 3.9  CL 106  CO2 27  GLUCOSE 95  BUN 12  CREATININE 1.13  CALCIUM 9.1    Liver Function Tests:  Recent Labs Lab 06/16/16 1034  AST 23  ALT 20  ALKPHOS 79  BILITOT 0.8  PROT 7.5  ALBUMIN 4.2   No results for input(s): LIPASE, AMYLASE in the last 168 hours. No results for input(s): AMMONIA in the last 168 hours.  CBC:  Recent Labs Lab 06/16/16 1034  WBC 4.6  HGB 13.4  HCT 38.7*  MCV 86.2  PLT 177    Cardiac Enzymes:  Recent Labs Lab 06/16/16 1034  TROPONINI <0.03    BNP: Invalid input(s): POCBNP  CBG: No results for input(s): GLUCAP in the last 168 hours.  Microbiology: No results found for this or any previous visit.  Coagulation Studies: No results for input(s): LABPROT, INR in the last 72 hours.  Urinalysis:  Recent Labs Lab 06/16/16 [place] 1.005  PHURINE 7.0  GLUCOSEU NEGATIVE  HGBUR NEGATIVE  BILIRUBINUR NEGATIVE  KETONESUR NEGATIVE  PROTEINUR NEGATIVE  NITRITE NEGATIVE  LEUKOCYTESUR NEGATIVE    Lipid Panel:     Component Value Date/Time   CHOL 127 06/17/2016 0523   TRIG 111 06/17/2016 0523   HDL 39 (L) 06/17/2016 0523   CHOLHDL 3.3 06/17/2016 0523   VLDL 22 06/17/2016 0523   LDLCALC 66 06/17/2016 0523    HgbA1C: No results found for: HGBA1C  Urine Drug Screen:  No results found for: LABOPIA, COCAINSCRNUR, LABBENZ, AMPHETMU, THCU, LABBARB  Alcohol Level: No results for input(s): ETH in the last 168 hours.   Imaging: Ct Angio Head W Or Wo Contrast  Result Date: 06/16/2016 CLINICAL DATA:  Ischial evaluation for acute confusion, abnormal MRI. EXAM: CT ANGIOGRAPHY HEAD AND NECK TECHNIQUE:  Multidetector CT imaging of the head and neck was performed using the standard protocol during bolus administration of intravenous contrast. Multiplanar CT image reconstructions and MIPs were obtained to evaluate the vascular anatomy. Carotid stenosis measurements (when applicable) are obtained utilizing NASCET criteria, using the distal internal carotid diameter as the denominator. CONTRAST:  75 cc of Isovue 370. COMPARISON:  Prior CT and MRI from earlier the same day. FINDINGS: CTA NECK FINDINGS Aortic arch: Visualized aortic arch of normal caliber with normal branch pattern. No flow-limiting stenosis about the origin of the great vessels. Visualized subclavian arteries widely patent. Right carotid system: Right common carotid artery patent from its origin to the bifurcation. Severe atheromatous stenosis at the proximal right ICA with essentially complete occlusion (greater than 90%). Probable faint string of contrast traverses this stenosis. Stenosis begins at the bifurcation and measures approximately 2 cm in length. Superimposed probable penetrating plaque noted at the distal margin of this stenosis (series 6, image 215). Distally, right ICA AA is markedly diminutive and attenuated as compared to the left, but is patent to the skullbase. No other focal high-grade stenosis within the right ICA. Right external carotid artery and its branches are widely patent. Left carotid system: Left common carotid artery patent from its origin to the bifurcation. Calcified atheromatous plaque about the left bifurcation/ proximal left ICA. Associated short-segment stenosis of up to 40-50% by NASCET criteria (Series 6, image 224). Left ICA widely patent distally to the skullbase. Vertebral arteries: Both of the vertebral arteries arise from the subclavian arteries. Right vertebral artery slightly dominant and widely patent within the neck without stenosis. Atheromatous plaque at the origin of the left vertebral artery with  moderate to severe short-segment stenosis. Left vertebral artery otherwise widely patent within the neck. Skeleton: The no acute osseus abnormality. No worrisome lytic or blastic osseous lesions. Mild degenerative spondylolysis noted at C5-6. Median sternotomy wires noted. Other neck: No other acute soft tissue abnormality within the neck. Thyroid normal. Salivary glands within normal limits. No adenopathy. Upper chest: Visualized upper chest demonstrates no acute abnormality. Sequela prior CABG noted. Diffuse 3 vessel coronary artery calcifications partially visualized. Partially visualized lungs are clear. Review of the MIP images confirms the above findings CTA HEAD FINDINGS Anterior circulation: Right ICA is diminutive and attenuated two to level of the terminus. Scattered atheromatous plaque within the cavernous right ICA with mild to moderate multifocal narrowing. Petrous left ICA widely patent. Mild scattered plaque within the cavernous left ICA without flow-limiting stenosis. Left ICA terminus widely patent. A1 segments widely patent. Anterior communicating artery patent and within normal limits. Anterior cerebral arteries patent to their distal aspects. M1 segments patent without stenosis or occlusion. MCA bifurcations are  within normal limits. No proximal M2 occlusion. Distal MCA branches well opacified and symmetric. Posterior circulation: Vertebral arteries patent to the vertebrobasilar junction without stenosis. Posterior inferior cerebral arteries patent bilaterally. Basilar artery widely patent. Superior cerebral arteries patent bilaterally. Both of the posterior cerebral arteries primarily supplied via the basilar and are widely patent to their distal aspects. Small right posterior communicating artery noted. Venous sinuses: Patent. Anatomic variants: No significant anatomic variant. No aneurysm or vascular malformation. Delayed phase: No pathologic enhancement. Review of the MIP images confirms the  above findings IMPRESSION: 1. Severe near occlusive stenosis at the origin of the right internal carotid artery. Stenosis begins at the bifurcation and measures approximately 2 cm in length. Right ICA is diminutive and attenuated distally to the level of the terminus. Right MCA and its branches are well perfused via flow across the circle of Willis. 2. Atheromatous plaque at the proximal left ICA with associated stenosis of up to 40-50% by NASCET criteria. 3. Atheromatous plaque at the origin of the left vertebral artery with short-segment moderate to severe stenosis. Otherwise widely patent vertebrobasilar system. 4. Multifocal atheromatous plaque within the carotid siphons with mild to moderate multifocal narrowing, right worse than left. 5. Severe coronary artery calcifications with sequelae of prior CABG. Electronically Signed   By: Jeannine Boga M.D.   On: 06/16/2016 16:28   Ct Head Wo Contrast  Result Date: 06/16/2016 CLINICAL DATA:  Headache, dizziness. EXAM: CT HEAD WITHOUT CONTRAST TECHNIQUE: Contiguous axial images were obtained from the base of the skull through the vertex without intravenous contrast. COMPARISON:  None. FINDINGS: Brain: No evidence of acute infarction, hemorrhage, hydrocephalus, extra-axial collection or mass lesion/mass effect. Vascular: Atherosclerosis of carotid siphons is noted. Skull: Normal. Negative for fracture or focal lesion. Sinuses/Orbits: No acute finding. Other: None. IMPRESSION: No acute intracranial abnormality seen. Electronically Signed   By: Marijo Conception, M.D.   On: 06/16/2016 10:13   Ct Angio Neck W And/or Wo Contrast  Result Date: 06/16/2016 CLINICAL DATA:  Ischial evaluation for acute confusion, abnormal MRI. EXAM: CT ANGIOGRAPHY HEAD AND NECK TECHNIQUE: Multidetector CT imaging of the head and neck was performed using the standard protocol during bolus administration of intravenous contrast. Multiplanar CT image reconstructions and MIPs were  obtained to evaluate the vascular anatomy. Carotid stenosis measurements (when applicable) are obtained utilizing NASCET criteria, using the distal internal carotid diameter as the denominator. CONTRAST:  75 cc of Isovue 370. COMPARISON:  Prior CT and MRI from earlier the same day. FINDINGS: CTA NECK FINDINGS Aortic arch: Visualized aortic arch of normal caliber with normal branch pattern. No flow-limiting stenosis about the origin of the great vessels. Visualized subclavian arteries widely patent. Right carotid system: Right common carotid artery patent from its origin to the bifurcation. Severe atheromatous stenosis at the proximal right ICA with essentially complete occlusion (greater than 90%). Probable faint string of contrast traverses this stenosis. Stenosis begins at the bifurcation and measures approximately 2 cm in length. Superimposed probable penetrating plaque noted at the distal margin of this stenosis (series 6, image 215). Distally, right ICA AA is markedly diminutive and attenuated as compared to the left, but is patent to the skullbase. No other focal high-grade stenosis within the right ICA. Right external carotid artery and its branches are widely patent. Left carotid system: Left common carotid artery patent from its origin to the bifurcation. Calcified atheromatous plaque about the left bifurcation/ proximal left ICA. Associated short-segment stenosis of up to 40-50% by NASCET criteria (Series  6, image 224). Left ICA widely patent distally to the skullbase. Vertebral arteries: Both of the vertebral arteries arise from the subclavian arteries. Right vertebral artery slightly dominant and widely patent within the neck without stenosis. Atheromatous plaque at the origin of the left vertebral artery with moderate to severe short-segment stenosis. Left vertebral artery otherwise widely patent within the neck. Skeleton: The no acute osseus abnormality. No worrisome lytic or blastic osseous lesions.  Mild degenerative spondylolysis noted at C5-6. Median sternotomy wires noted. Other neck: No other acute soft tissue abnormality within the neck. Thyroid normal. Salivary glands within normal limits. No adenopathy. Upper chest: Visualized upper chest demonstrates no acute abnormality. Sequela prior CABG noted. Diffuse 3 vessel coronary artery calcifications partially visualized. Partially visualized lungs are clear. Review of the MIP images confirms the above findings CTA HEAD FINDINGS Anterior circulation: Right ICA is diminutive and attenuated two to level of the terminus. Scattered atheromatous plaque within the cavernous right ICA with mild to moderate multifocal narrowing. Petrous left ICA widely patent. Mild scattered plaque within the cavernous left ICA without flow-limiting stenosis. Left ICA terminus widely patent. A1 segments widely patent. Anterior communicating artery patent and within normal limits. Anterior cerebral arteries patent to their distal aspects. M1 segments patent without stenosis or occlusion. MCA bifurcations are within normal limits. No proximal M2 occlusion. Distal MCA branches well opacified and symmetric. Posterior circulation: Vertebral arteries patent to the vertebrobasilar junction without stenosis. Posterior inferior cerebral arteries patent bilaterally. Basilar artery widely patent. Superior cerebral arteries patent bilaterally. Both of the posterior cerebral arteries primarily supplied via the basilar and are widely patent to their distal aspects. Small right posterior communicating artery noted. Venous sinuses: Patent. Anatomic variants: No significant anatomic variant. No aneurysm or vascular malformation. Delayed phase: No pathologic enhancement. Review of the MIP images confirms the above findings IMPRESSION: 1. Severe near occlusive stenosis at the origin of the right internal carotid artery. Stenosis begins at the bifurcation and measures approximately 2 cm in length. Right  ICA is diminutive and attenuated distally to the level of the terminus. Right MCA and its branches are well perfused via flow across the circle of Willis. 2. Atheromatous plaque at the proximal left ICA with associated stenosis of up to 40-50% by NASCET criteria. 3. Atheromatous plaque at the origin of the left vertebral artery with short-segment moderate to severe stenosis. Otherwise widely patent vertebrobasilar system. 4. Multifocal atheromatous plaque within the carotid siphons with mild to moderate multifocal narrowing, right worse than left. 5. Severe coronary artery calcifications with sequelae of prior CABG. Electronically Signed   By: Jeannine Boga M.D.   On: 06/16/2016 16:28   Mr Brain Wo Contrast  Result Date: 06/16/2016 CLINICAL DATA:  Initial evaluation for acute confusion. EXAM: MRI HEAD WITHOUT CONTRAST TECHNIQUE: Multiplanar, multiecho pulse sequences of the brain and surrounding structures were obtained without intravenous contrast. COMPARISON:  Prior CT from earlier the same day. FINDINGS: Brain: Age-appropriate cerebral atrophy. Few scattered patchy T2/FLAIR hyperintense foci noted within the periventricular and deep white matter both cerebral hemispheres, nonspecific, but most like related chronic small vessel ischemic disease. Overall, changes felt to be within normal limits for age. No abnormal foci of restricted diffusion to suggest acute or subacute ischemia. Gray-white matter differentiation maintained. No encephalomalacia to suggest chronic infarction. Single punctate focus of susceptibility artifact at the posterior right parietal lobe, of doubtful significance in isolation. No other evidence for acute or chronic intracranial hemorrhage. No mass lesion, midline shift or mass effect. No  hydrocephalus. No extra-axial fluid collection. Major dural sinuses are grossly patent. Pituitary gland suprasellar region within normal limits. Midline structures intact and normal. Vascular:  Abnormal flow void within the right ICA to the level of the terminus, which may be related to occlusion and/or slow flow. Intracranial vascular flow voids are otherwise maintained. Normal flow void is seen within the right MCA and its branches. Skull and upper cervical spine: Craniocervical junction within normal limits. Visualized upper cervical spine unremarkable. Bone marrow signal intensity within normal limits. No scalp soft tissue abnormality. Sinuses/Orbits: Globes normal soft tissues within normal limits. Scattered mucosal thickening throughout the paranasal sinuses. Few small superimposed retention cysts noted within left maxillary sinus. No air-fluid level to suggest active sinus infection. No mastoid effusion. Inner ear structures normal. Other: None. IMPRESSION: 1. No acute intracranial process identified. 2. Abnormal flow void within the right ICA to the level of the terminus, which may be related to slow flow and/or occlusion. 3. Otherwise normal brain MRI for age. Electronically Signed   By: Jeannine Boga M.D.   On: 06/16/2016 14:31     Assessment/Plan: 66 year old male presenting with complaints of balance difficulties and episodic confusion.  Balance difficulties have been long standing and may have some component related to his peripheral neuropathy.  MRI of the brain reviewed and shows no acute changes.  CTA shows severe right ICA stenosis with left 40-50% stenosis.  Echocardiogram unremarkable with 55-60% EF.  EEG normal.  LDL 66.   Unclear cause of period of confusion.  Can not rule out an episode of TGA.  Work up unremarkable other than ICA stenosis.  Recommendations: 1.  ASA 347m daily.  Patient to be evaluated by vascular.  Timing for Plavix to be determined at that time.    Case discussed with Dr. MFredirick Maudlin MD Neurology [phone]/30/2018, 12:01 PM

## 2016-06-17 NOTE — Progress Notes (Signed)
PT Cancellation Note  Patient Details Name: Jacob [name] MRN: [id number] DOB: Mar 31, 1950   Cancelled Treatment:    Reason Eval/Treat Not Completed: PT screened, no needs identified, will sign off.  Went to see pt to complete PT Evaluation.  Dr. Benjie Karvonen at bedside and reports the pt does not have PT needs.  Pt in agreement that he has not noticed any need for PT.  Was instructed to notify RN if he notices any changes so PT can be reconsulted if needed.  PT will sign off.  Thank you for this order.   Collie Siad PT, DPT 06/17/2016, 10:44 AM

## 2016-06-17 NOTE — Progress Notes (Signed)
Discharge instructions given and went over with patient and patients family at bedside. All questions answered. Patient discharged home. Madlyn Frankel, RN

## 2016-06-17 NOTE — Discharge Summary (Signed)
[facility] NAME: Jacob [name]    MR#:  [id number]  DATE OF BIRTH:  Feb 14, 1950  DATE OF ADMISSION:  06/16/2016 ADMITTING PHYSICIAN: Hillary Bow, MD  DATE OF DISCHARGE: 06/17/2016  PRIMARY CARE PHYSICIAN: Hamrick, Lorin Mercy, MD    ADMISSION DIAGNOSIS:  Confusion [R41.0] Transient cerebral ischemia, unspecified type [G45.9]  DISCHARGE DIAGNOSIS:  Active Problems:   TIA (transient ischemic attack)   SECONDARY DIAGNOSIS:   Past Medical History:  Diagnosis Date  . Angina pectoris (HCC)    Recurrent, 1/07 stress CL showed normal perfusion  . Dyslipidemia   . ED (erectile dysfunction)   . History of blood transfusion 2005  . Hypertension   . Myocardial infarction ([place]) 2005  . Nephrolithiasis   . Ulcerative colitis ([place])    Left sided    HOSPITAL COURSE:   66 y/o male with HTN who presents with episode of confusion.  1. TIA: Patient had normal MRI however CTA Head/neck shows Severe near occlusive stenosis at the origin of the right internal carotid artery. Stenosis begins at the bifurcation and measures approximately 2 cm in length. Right ICA is diminutive and attenuated distally to the level of the terminus. Right MCA and its branches are well perfused via flow across the circle of Willis. He was seen by Neurology and Vascular Surgery. The plan is To have follow-up with vascular surgery for planned CEA. For now he will continue on aspirin and statin. We will avoid Plavix due to plans for outpatient surgery. He is asked not to drive or to lift heavy objects or heavy exertion until follow-up with vascular surgery.  2. Essential hypertension: Patient will continue on lisinopril and metoprolol.  3. CAD status post CABG and stents: Patient will continue metoprolol and aspirin and statin  DISCHARGE CONDITIONS AND DIET:   Stable for discharge on heart healthy diet  CONSULTS OBTAINED:  Treatment Team:  Catarina Hartshorn,  MD Alexis Goodell, MD  DRUG ALLERGIES:  No Known Allergies  DISCHARGE MEDICATIONS:   Current Discharge Medication List    CONTINUE these medications which have NOT CHANGED   Details  aspirin EC 81 MG tablet Take 1 tablet (81 mg total) by mouth daily. Qty: 90 tablet, Refills: 3   Associated Diagnoses: Essential hypertension, benign; Atherosclerosis of native coronary artery of native heart without angina pectoris; Pure hypercholesterolemia; Bradycardia, sinus    atorvastatin (LIPITOR) 40 MG tablet Take 1 tablet (40 mg total) by mouth daily. Qty: 90 tablet, Refills: 2    lisinopril (PRINIVIL,ZESTRIL) 5 MG tablet Take 1 tablet (5 mg total) by mouth daily. Qty: 90 tablet, Refills: 2    metoprolol succinate (TOPROL-XL) 25 MG 24 hr tablet Take 0.5 tablets (12.5 mg total) by mouth daily. Qty: 45 tablet, Refills: 2          Today   CHIEF COMPLAINT:  Patient is doing well this morning. Reports no confusion or dizziness. Reports no neurologic deficits   VITAL SIGNS:  Blood pressure 126/74, pulse 64, temperature 98.2 F (36.8 C), temperature source Oral, resp. rate 20, height 6' (1.829 m), weight 96.6 kg (213 lb), SpO2 96 %.   REVIEW OF SYSTEMS:  Review of Systems  Constitutional: Negative.  Negative for chills, fever and malaise/fatigue.  HENT: Negative.  Negative for ear discharge, ear pain, hearing loss, nosebleeds and sore throat.   Eyes: Negative.  Negative for blurred vision and pain.  Respiratory: Negative.  Negative for cough, hemoptysis, shortness of breath and  wheezing.   Cardiovascular: Negative.  Negative for chest pain, palpitations and leg swelling.  Gastrointestinal: Negative.  Negative for abdominal pain, blood in stool, diarrhea, nausea and vomiting.  Genitourinary: Negative.  Negative for dysuria.  Musculoskeletal: Negative.  Negative for back pain.  Skin: Negative.   Neurological: Negative for dizziness, tremors, speech change, focal weakness, seizures  and headaches.  Endo/Heme/Allergies: Negative.  Does not bruise/bleed easily.  Psychiatric/Behavioral: Negative.  Negative for depression, hallucinations and suicidal ideas.     PHYSICAL EXAMINATION:  GENERAL:  66 y.o.-year-old patient lying in the bed with no acute distress.  NECK:  Supple, no jugular venous distention. No thyroid enlargement, no tenderness.  LUNGS: Normal breath sounds bilaterally, no wheezing, rales,rhonchi  No use of accessory muscles of respiration.  CARDIOVASCULAR: S1, S2 normal. No murmurs, rubs, or gallops.  ABDOMEN: Soft, non-tender, non-distended. Bowel sounds present. No organomegaly or mass.  EXTREMITIES: No pedal edema, cyanosis, or clubbing.  PSYCHIATRIC: The patient is alert and oriented x 3.  SKIN: No obvious rash, lesion, or ulcer.   DATA REVIEW:   CBC  Recent Labs Lab 06/16/16 1034  WBC 4.6  HGB 13.4  HCT 38.7*  PLT 177    Chemistries   Recent Labs Lab 06/16/16 1034  NA 140  K 3.9  CL 106  CO2 27  GLUCOSE 95  BUN 12  CREATININE 1.13  CALCIUM 9.1  AST 23  ALT 20  ALKPHOS 79  BILITOT 0.8    Cardiac Enzymes  Recent Labs Lab 06/16/16 1034  TROPONINI <0.03    Microbiology Results  @[email or web address] @  RADIOLOGY:  Ct Angio Head W Or Wo Contrast  Result Date: 06/16/2016 CLINICAL DATA:  Ischial evaluation for acute confusion, abnormal MRI. EXAM: CT ANGIOGRAPHY HEAD AND NECK TECHNIQUE: Multidetector CT imaging of the head and neck was performed using the standard protocol during bolus administration of intravenous contrast. Multiplanar CT image reconstructions and MIPs were obtained to evaluate the vascular anatomy. Carotid stenosis measurements (when applicable) are obtained utilizing NASCET criteria, using the distal internal carotid diameter as the denominator. CONTRAST:  75 cc of Isovue 370. COMPARISON:  Prior CT and MRI from earlier the same day. FINDINGS: CTA NECK FINDINGS Aortic arch: Visualized aortic arch of normal caliber  with normal branch pattern. No flow-limiting stenosis about the origin of the great vessels. Visualized subclavian arteries widely patent. Right carotid system: Right common carotid artery patent from its origin to the bifurcation. Severe atheromatous stenosis at the proximal right ICA with essentially complete occlusion (greater than 90%). Probable faint string of contrast traverses this stenosis. Stenosis begins at the bifurcation and measures approximately 2 cm in length. Superimposed probable penetrating plaque noted at the distal margin of this stenosis (series 6, image 215). Distally, right ICA AA is markedly diminutive and attenuated as compared to the left, but is patent to the skullbase. No other focal high-grade stenosis within the right ICA. Right external carotid artery and its branches are widely patent. Left carotid system: Left common carotid artery patent from its origin to the bifurcation. Calcified atheromatous plaque about the left bifurcation/ proximal left ICA. Associated short-segment stenosis of up to 40-50% by NASCET criteria (Series 6, image 224). Left ICA widely patent distally to the skullbase. Vertebral arteries: Both of the vertebral arteries arise from the subclavian arteries. Right vertebral artery slightly dominant and widely patent within the neck without stenosis. Atheromatous plaque at the origin of the left vertebral artery with moderate to severe short-segment stenosis. Left vertebral artery otherwise  widely patent within the neck. Skeleton: The no acute osseus abnormality. No worrisome lytic or blastic osseous lesions. Mild degenerative spondylolysis noted at C5-6. Median sternotomy wires noted. Other neck: No other acute soft tissue abnormality within the neck. Thyroid normal. Salivary glands within normal limits. No adenopathy. Upper chest: Visualized upper chest demonstrates no acute abnormality. Sequela prior CABG noted. Diffuse 3 vessel coronary artery calcifications  partially visualized. Partially visualized lungs are clear. Review of the MIP images confirms the above findings CTA HEAD FINDINGS Anterior circulation: Right ICA is diminutive and attenuated two to level of the terminus. Scattered atheromatous plaque within the cavernous right ICA with mild to moderate multifocal narrowing. Petrous left ICA widely patent. Mild scattered plaque within the cavernous left ICA without flow-limiting stenosis. Left ICA terminus widely patent. A1 segments widely patent. Anterior communicating artery patent and within normal limits. Anterior cerebral arteries patent to their distal aspects. M1 segments patent without stenosis or occlusion. MCA bifurcations are within normal limits. No proximal M2 occlusion. Distal MCA branches well opacified and symmetric. Posterior circulation: Vertebral arteries patent to the vertebrobasilar junction without stenosis. Posterior inferior cerebral arteries patent bilaterally. Basilar artery widely patent. Superior cerebral arteries patent bilaterally. Both of the posterior cerebral arteries primarily supplied via the basilar and are widely patent to their distal aspects. Small right posterior communicating artery noted. Venous sinuses: Patent. Anatomic variants: No significant anatomic variant. No aneurysm or vascular malformation. Delayed phase: No pathologic enhancement. Review of the MIP images confirms the above findings IMPRESSION: 1. Severe near occlusive stenosis at the origin of the right internal carotid artery. Stenosis begins at the bifurcation and measures approximately 2 cm in length. Right ICA is diminutive and attenuated distally to the level of the terminus. Right MCA and its branches are well perfused via flow across the circle of Willis. 2. Atheromatous plaque at the proximal left ICA with associated stenosis of up to 40-50% by NASCET criteria. 3. Atheromatous plaque at the origin of the left vertebral artery with short-segment moderate  to severe stenosis. Otherwise widely patent vertebrobasilar system. 4. Multifocal atheromatous plaque within the carotid siphons with mild to moderate multifocal narrowing, right worse than left. 5. Severe coronary artery calcifications with sequelae of prior CABG. Electronically Signed   By: Jeannine Boga M.D.   On: 06/16/2016 16:28   Ct Head Wo Contrast  Result Date: 06/16/2016 CLINICAL DATA:  Headache, dizziness. EXAM: CT HEAD WITHOUT CONTRAST TECHNIQUE: Contiguous axial images were obtained from the base of the skull through the vertex without intravenous contrast. COMPARISON:  None. FINDINGS: Brain: No evidence of acute infarction, hemorrhage, hydrocephalus, extra-axial collection or mass lesion/mass effect. Vascular: Atherosclerosis of carotid siphons is noted. Skull: Normal. Negative for fracture or focal lesion. Sinuses/Orbits: No acute finding. Other: None. IMPRESSION: No acute intracranial abnormality seen. Electronically Signed   By: Marijo Conception, M.D.   On: 06/16/2016 10:13   Ct Angio Neck W And/or Wo Contrast  Result Date: 06/16/2016 CLINICAL DATA:  Ischial evaluation for acute confusion, abnormal MRI. EXAM: CT ANGIOGRAPHY HEAD AND NECK TECHNIQUE: Multidetector CT imaging of the head and neck was performed using the standard protocol during bolus administration of intravenous contrast. Multiplanar CT image reconstructions and MIPs were obtained to evaluate the vascular anatomy. Carotid stenosis measurements (when applicable) are obtained utilizing NASCET criteria, using the distal internal carotid diameter as the denominator. CONTRAST:  75 cc of Isovue 370. COMPARISON:  Prior CT and MRI from earlier the same day. FINDINGS: CTA NECK FINDINGS Aortic  arch: Visualized aortic arch of normal caliber with normal branch pattern. No flow-limiting stenosis about the origin of the great vessels. Visualized subclavian arteries widely patent. Right carotid system: Right common carotid artery patent  from its origin to the bifurcation. Severe atheromatous stenosis at the proximal right ICA with essentially complete occlusion (greater than 90%). Probable faint string of contrast traverses this stenosis. Stenosis begins at the bifurcation and measures approximately 2 cm in length. Superimposed probable penetrating plaque noted at the distal margin of this stenosis (series 6, image 215). Distally, right ICA AA is markedly diminutive and attenuated as compared to the left, but is patent to the skullbase. No other focal high-grade stenosis within the right ICA. Right external carotid artery and its branches are widely patent. Left carotid system: Left common carotid artery patent from its origin to the bifurcation. Calcified atheromatous plaque about the left bifurcation/ proximal left ICA. Associated short-segment stenosis of up to 40-50% by NASCET criteria (Series 6, image 224). Left ICA widely patent distally to the skullbase. Vertebral arteries: Both of the vertebral arteries arise from the subclavian arteries. Right vertebral artery slightly dominant and widely patent within the neck without stenosis. Atheromatous plaque at the origin of the left vertebral artery with moderate to severe short-segment stenosis. Left vertebral artery otherwise widely patent within the neck. Skeleton: The no acute osseus abnormality. No worrisome lytic or blastic osseous lesions. Mild degenerative spondylolysis noted at C5-6. Median sternotomy wires noted. Other neck: No other acute soft tissue abnormality within the neck. Thyroid normal. Salivary glands within normal limits. No adenopathy. Upper chest: Visualized upper chest demonstrates no acute abnormality. Sequela prior CABG noted. Diffuse 3 vessel coronary artery calcifications partially visualized. Partially visualized lungs are clear. Review of the MIP images confirms the above findings CTA HEAD FINDINGS Anterior circulation: Right ICA is diminutive and attenuated two to  level of the terminus. Scattered atheromatous plaque within the cavernous right ICA with mild to moderate multifocal narrowing. Petrous left ICA widely patent. Mild scattered plaque within the cavernous left ICA without flow-limiting stenosis. Left ICA terminus widely patent. A1 segments widely patent. Anterior communicating artery patent and within normal limits. Anterior cerebral arteries patent to their distal aspects. M1 segments patent without stenosis or occlusion. MCA bifurcations are within normal limits. No proximal M2 occlusion. Distal MCA branches well opacified and symmetric. Posterior circulation: Vertebral arteries patent to the vertebrobasilar junction without stenosis. Posterior inferior cerebral arteries patent bilaterally. Basilar artery widely patent. Superior cerebral arteries patent bilaterally. Both of the posterior cerebral arteries primarily supplied via the basilar and are widely patent to their distal aspects. Small right posterior communicating artery noted. Venous sinuses: Patent. Anatomic variants: No significant anatomic variant. No aneurysm or vascular malformation. Delayed phase: No pathologic enhancement. Review of the MIP images confirms the above findings IMPRESSION: 1. Severe near occlusive stenosis at the origin of the right internal carotid artery. Stenosis begins at the bifurcation and measures approximately 2 cm in length. Right ICA is diminutive and attenuated distally to the level of the terminus. Right MCA and its branches are well perfused via flow across the circle of Willis. 2. Atheromatous plaque at the proximal left ICA with associated stenosis of up to 40-50% by NASCET criteria. 3. Atheromatous plaque at the origin of the left vertebral artery with short-segment moderate to severe stenosis. Otherwise widely patent vertebrobasilar system. 4. Multifocal atheromatous plaque within the carotid siphons with mild to moderate multifocal narrowing, right worse than left. 5.  Severe coronary artery calcifications  with sequelae of prior CABG. Electronically Signed   By: Jeannine Boga M.D.   On: 06/16/2016 16:28   Mr Brain Wo Contrast  Result Date: 06/16/2016 CLINICAL DATA:  Initial evaluation for acute confusion. EXAM: MRI HEAD WITHOUT CONTRAST TECHNIQUE: Multiplanar, multiecho pulse sequences of the brain and surrounding structures were obtained without intravenous contrast. COMPARISON:  Prior CT from earlier the same day. FINDINGS: Brain: Age-appropriate cerebral atrophy. Few scattered patchy T2/FLAIR hyperintense foci noted within the periventricular and deep white matter both cerebral hemispheres, nonspecific, but most like related chronic small vessel ischemic disease. Overall, changes felt to be within normal limits for age. No abnormal foci of restricted diffusion to suggest acute or subacute ischemia. Gray-white matter differentiation maintained. No encephalomalacia to suggest chronic infarction. Single punctate focus of susceptibility artifact at the posterior right parietal lobe, of doubtful significance in isolation. No other evidence for acute or chronic intracranial hemorrhage. No mass lesion, midline shift or mass effect. No hydrocephalus. No extra-axial fluid collection. Major dural sinuses are grossly patent. Pituitary gland suprasellar region within normal limits. Midline structures intact and normal. Vascular: Abnormal flow void within the right ICA to the level of the terminus, which may be related to occlusion and/or slow flow. Intracranial vascular flow voids are otherwise maintained. Normal flow void is seen within the right MCA and its branches. Skull and upper cervical spine: Craniocervical junction within normal limits. Visualized upper cervical spine unremarkable. Bone marrow signal intensity within normal limits. No scalp soft tissue abnormality. Sinuses/Orbits: Globes normal soft tissues within normal limits. Scattered mucosal thickening throughout  the paranasal sinuses. Few small superimposed retention cysts noted within left maxillary sinus. No air-fluid level to suggest active sinus infection. No mastoid effusion. Inner ear structures normal. Other: None. IMPRESSION: 1. No acute intracranial process identified. 2. Abnormal flow void within the right ICA to the level of the terminus, which may be related to slow flow and/or occlusion. 3. Otherwise normal brain MRI for age. Electronically Signed   By: Jeannine Boga M.D.   On: 06/16/2016 14:31      Current Discharge Medication List    CONTINUE these medications which have NOT CHANGED   Details  aspirin EC 81 MG tablet Take 1 tablet (81 mg total) by mouth daily. Qty: 90 tablet, Refills: 3   Associated Diagnoses: Essential hypertension, benign; Atherosclerosis of native coronary artery of native heart without angina pectoris; Pure hypercholesterolemia; Bradycardia, sinus    atorvastatin (LIPITOR) 40 MG tablet Take 1 tablet (40 mg total) by mouth daily. Qty: 90 tablet, Refills: 2    lisinopril (PRINIVIL,ZESTRIL) 5 MG tablet Take 1 tablet (5 mg total) by mouth daily. Qty: 90 tablet, Refills: 2    metoprolol succinate (TOPROL-XL) 25 MG 24 hr tablet Take 0.5 tablets (12.5 mg total) by mouth daily. Qty: 45 tablet, Refills: 2          Management plans discussed with the patient and he is in agreement. Stable for discharge home  Patient should follow up with dr Lucky Cowboy and PCP  CODE STATUS:     Code Status Orders        Start     Ordered   06/16/16 1526  Full code  Continuous     06/16/16 1526    Code Status History    Date Active Date Inactive Code Status Order ID Comments User Context   This patient has a current code status but no historical code status.      TOTAL TIME TAKING CARE  OF THIS PATIENT: 37 minutes.    Note: This dictation was prepared with Dragon dictation along with smaller phrase technology. Any transcriptional errors that result from this process  are unintentional.  Christorpher Hisaw M.D on 06/17/2016 at 10:53 AM  Between 7am to 6pm - Pager - [phone] After 6pm go to [email or web address] - password [email or web address] [facility]  Office  [phone]  CC: Primary care physician; Leonides Sake, MD

## 2016-06-17 NOTE — Consult Note (Signed)
[facility] Consult Note  MRN : [id number]  Jacob [name] is a 66 y.o. (10-06-1950) male who presents with chief complaint of  Chief Complaint  Patient presents with  . Dizziness  .  History of Present Illness: I am asked by Dr. Benjie Karvonen to see the patient regarding carotid stenosis.  The patient had an episode this weekend when he became lost and confused during a typical car trip. His son says this was also associated with balance issues and staggering. By Monday, he had returned basically to his baseline. He denied any arm or leg weakness or numbness that he can tell. He had no visual symptoms. He had no speech or swallowing symptoms. He had no fevers or chills or chest pain. As part of his workup he has had a MRI which was negative for acute stroke. He had a CT angiogram which I have independently reviewed. This demonstrates a high-grade, near occlusive stenosis of the right internal carotid artery at its origin. He has mild carotid artery stenosis on the left in the 40-50% range. He has moderate left vertebral artery stenosis.  Current Facility-Administered Medications  Medication Dose Route Frequency Provider Last Rate Last Dose  . acetaminophen (TYLENOL) tablet 650 mg  650 mg Oral Q6H PRN Hillary Bow, MD       Or  . acetaminophen (TYLENOL) suppository 650 mg  650 mg Rectal Q6H PRN Sudini, Alveta Heimlich, MD      . aspirin EC tablet 81 mg  81 mg Oral Daily Hillary Bow, MD   81 mg at 06/17/16 1006  . atorvastatin (LIPITOR) tablet 40 mg  40 mg Oral Daily Sudini, Srikar, MD      . enoxaparin (LOVENOX) injection 40 mg  40 mg Subcutaneous Q24H Hillary Bow, MD   40 mg at 06/16/16 2133  . lisinopril (PRINIVIL,ZESTRIL) tablet 5 mg  5 mg Oral Daily Hillary Bow, MD   5 mg at 06/17/16 1007  . metoprolol succinate (TOPROL-XL) 24 hr tablet 12.5 mg  12.5 mg Oral Daily Sudini, Alveta Heimlich, MD   12.5 mg at 06/17/16 1008  . ondansetron (ZOFRAN) tablet 4 mg  4 mg Oral Q6H  PRN Hillary Bow, MD       Or  . ondansetron (ZOFRAN) injection 4 mg  4 mg Intravenous Q6H PRN Sudini, Srikar, MD      . polyethylene glycol (MIRALAX / GLYCOLAX) packet 17 g  17 g Oral Daily PRN Sudini, Srikar, MD      . sodium chloride flush (NS) 0.9 % injection 3 mL  3 mL Intravenous Q12H Hillary Bow, MD   3 mL at 06/17/16 1009    Past Medical History:  Diagnosis Date  . Angina pectoris (HCC)    Recurrent, 1/07 stress CL showed normal perfusion  . Dyslipidemia   . ED (erectile dysfunction)   . History of blood transfusion 2005  . Hypertension   . Myocardial infarction ([place]) 2005  . Nephrolithiasis   . Ulcerative colitis ([place])    Left sided    Past Surgical History:  Procedure Laterality Date  . CARDIAC CATHETERIZATION  06/08   Patnet grafts LAD, marginal, distal right  . carpel tunnel surgery Left   . COLONOSCOPY WITH PROPOFOL N/A 10/25/2012   Procedure: COLONOSCOPY WITH PROPOFOL;  Surgeon: Garlan Fair, MD;  Location: [facility];  Service: Endoscopy;  Laterality: N/A;  . CORONARY ARTERY BYPASS GRAFT  2005   ASCVD,multivessel,S/P CABG  . HERNIA REPAIR  many years ago  .  KNEE ARTHROSCOPY Right 1995    Social History Social History  Substance Use Topics  . Smoking status: Never Smoker  . Smokeless tobacco: Never Used  . Alcohol use No  No IVDU  Family History Family History  Problem Relation Age of Onset  . Ovarian cancer Mother   . CAD Father   . CAD Sister   . CAD Brother   . CAD Brother   . CAD Brother   No bleeding or clotting disorders  No Known Allergies   REVIEW OF SYSTEMS (Negative unless checked)  Constitutional: [] Weight loss  [] Fever  [] Chills Cardiac: [] Chest pain   [] Chest pressure   [x] Palpitations   [] Shortness of breath when laying flat   [] Shortness of breath at rest   [] Shortness of breath with exertion. Vascular:  [] Pain in legs with walking   [] Pain in legs at rest   [] Pain in legs when laying flat   [] Claudication   [] Pain in  feet when walking  [] Pain in feet at rest  [] Pain in feet when laying flat   [] History of DVT   [] Phlebitis   [] Swelling in legs   [] Varicose veins   [] Non-healing ulcers Pulmonary:   [] Uses home oxygen   [] Productive cough   [] Hemoptysis   [] Wheeze  [] COPD   [] Asthma Neurologic:  [] Dizziness  [] Blackouts   [] Seizures   [] History of stroke   [x] History of TIA  [] Aphasia   [] Temporary blindness   [] Dysphagia   [] Weakness or numbness in arms   [] Weakness or numbness in legs Musculoskeletal:  [] Arthritis   [] Joint swelling   [] Joint pain   [] Low back pain Hematologic:  [] Easy bruising  [] Easy bleeding   [] Hypercoagulable state   [] Anemic  [] Hepatitis Gastrointestinal:  [x] Blood in stool   [] Vomiting blood  [] Gastroesophageal reflux/heartburn   [] Difficulty swallowing. Genitourinary:  [] Chronic kidney disease   [] Difficult urination  [] Frequent urination  [] Burning with urination   [] Blood in urine Skin:  [] Rashes   [] Ulcers   [] Wounds Psychological:  [] History of anxiety   []  History of major depression.  Physical Examination  Vitals:   06/17/16 0418 06/17/16 0553 06/17/16 0937 06/17/16 1247  BP: 113/65 114/64 126/74 118/70  Pulse: 62 (!) 53 64 62  Resp: 14 14 20    Temp: 97.6 F (36.4 C) 98 F (36.7 C) 98.2 F (36.8 C) 97.8 F (36.6 C)  TempSrc: Oral Oral Oral Oral  SpO2: 99% 95% 96% 97%  Weight:  96.6 kg (213 lb)    Height:       Body mass index is 28.89 kg/m. Gen:  WD/WN, NAD Head: [place]/AT, No temporalis wasting.  Ear/Nose/Throat: Hearing grossly intact, nares w/o erythema or drainage, oropharynx w/o Erythema/Exudate Eyes: Sclera non-icteric, conjunctiva clear Neck: Trachea midline.  No JVD.  Pulmonary:  Good air movement, respirations not labored, equal bilaterally.  Cardiac: RRR, normal S1, S2. Vascular:  Vessel Right Left  Radial Palpable Palpable  Ulnar Palpable Palpable  Brachial Palpable Palpable  Carotid Palpable, with bruit Palpable, without bruit                        Gastrointestinal: soft, non-tender/non-distended.  Musculoskeletal: M/S 5/5 throughout.  Extremities without ischemic changes.  No deformity or atrophy. No edema. Neurologic: Sensation grossly intact in extremities.  Symmetrical.  Speech is fluent. Motor exam as listed above. Psychiatric: Judgment intact, Mood & affect appropriate for pt's clinical situation. Dermatologic: No rashes or ulcers noted.  No cellulitis or open wounds.  CBC Lab Results  Component Value Date   WBC 4.6 06/16/2016   HGB 13.4 06/16/2016   HCT 38.7 (L) 06/16/2016   MCV 86.2 06/16/2016   PLT 177 06/16/2016    BMET    Component Value Date/Time   NA 140 06/16/2016 1034   K 3.9 06/16/2016 1034   CL 106 06/16/2016 1034   CO2 27 06/16/2016 1034   GLUCOSE 95 06/16/2016 1034   BUN 12 06/16/2016 1034   CREATININE 1.13 06/16/2016 1034   CREATININE 1.15 11/07/2015 0903   CALCIUM 9.1 06/16/2016 1034   GFRNONAA >60 06/16/2016 1034   GFRAA >60 06/16/2016 1034   Estimated Creatinine Clearance: 79.6 mL/min (by C-G formula based on SCr of 1.13 mg/dL).  COAG No results found for: INR, PROTIME  Radiology Ct Angio Head W Or Wo Contrast  Result Date: 06/16/2016 CLINICAL DATA:  Ischial evaluation for acute confusion, abnormal MRI. EXAM: CT ANGIOGRAPHY HEAD AND NECK TECHNIQUE: Multidetector CT imaging of the head and neck was performed using the standard protocol during bolus administration of intravenous contrast. Multiplanar CT image reconstructions and MIPs were obtained to evaluate the vascular anatomy. Carotid stenosis measurements (when applicable) are obtained utilizing NASCET criteria, using the distal internal carotid diameter as the denominator. CONTRAST:  75 cc of Isovue 370. COMPARISON:  Prior CT and MRI from earlier the same day. FINDINGS: CTA NECK FINDINGS Aortic arch: Visualized aortic arch of normal caliber with normal branch pattern. No flow-limiting stenosis about the origin of the great  vessels. Visualized subclavian arteries widely patent. Right carotid system: Right common carotid artery patent from its origin to the bifurcation. Severe atheromatous stenosis at the proximal right ICA with essentially complete occlusion (greater than 90%). Probable faint string of contrast traverses this stenosis. Stenosis begins at the bifurcation and measures approximately 2 cm in length. Superimposed probable penetrating plaque noted at the distal margin of this stenosis (series 6, image 215). Distally, right ICA AA is markedly diminutive and attenuated as compared to the left, but is patent to the skullbase. No other focal high-grade stenosis within the right ICA. Right external carotid artery and its branches are widely patent. Left carotid system: Left common carotid artery patent from its origin to the bifurcation. Calcified atheromatous plaque about the left bifurcation/ proximal left ICA. Associated short-segment stenosis of up to 40-50% by NASCET criteria (Series 6, image 224). Left ICA widely patent distally to the skullbase. Vertebral arteries: Both of the vertebral arteries arise from the subclavian arteries. Right vertebral artery slightly dominant and widely patent within the neck without stenosis. Atheromatous plaque at the origin of the left vertebral artery with moderate to severe short-segment stenosis. Left vertebral artery otherwise widely patent within the neck. Skeleton: The no acute osseus abnormality. No worrisome lytic or blastic osseous lesions. Mild degenerative spondylolysis noted at C5-6. Median sternotomy wires noted. Other neck: No other acute soft tissue abnormality within the neck. Thyroid normal. Salivary glands within normal limits. No adenopathy. Upper chest: Visualized upper chest demonstrates no acute abnormality. Sequela prior CABG noted. Diffuse 3 vessel coronary artery calcifications partially visualized. Partially visualized lungs are clear. Review of the MIP images  confirms the above findings CTA HEAD FINDINGS Anterior circulation: Right ICA is diminutive and attenuated two to level of the terminus. Scattered atheromatous plaque within the cavernous right ICA with mild to moderate multifocal narrowing. Petrous left ICA widely patent. Mild scattered plaque within the cavernous left ICA without flow-limiting stenosis. Left ICA terminus widely patent. A1 segments widely patent.  Anterior communicating artery patent and within normal limits. Anterior cerebral arteries patent to their distal aspects. M1 segments patent without stenosis or occlusion. MCA bifurcations are within normal limits. No proximal M2 occlusion. Distal MCA branches well opacified and symmetric. Posterior circulation: Vertebral arteries patent to the vertebrobasilar junction without stenosis. Posterior inferior cerebral arteries patent bilaterally. Basilar artery widely patent. Superior cerebral arteries patent bilaterally. Both of the posterior cerebral arteries primarily supplied via the basilar and are widely patent to their distal aspects. Small right posterior communicating artery noted. Venous sinuses: Patent. Anatomic variants: No significant anatomic variant. No aneurysm or vascular malformation. Delayed phase: No pathologic enhancement. Review of the MIP images confirms the above findings IMPRESSION: 1. Severe near occlusive stenosis at the origin of the right internal carotid artery. Stenosis begins at the bifurcation and measures approximately 2 cm in length. Right ICA is diminutive and attenuated distally to the level of the terminus. Right MCA and its branches are well perfused via flow across the circle of Willis. 2. Atheromatous plaque at the proximal left ICA with associated stenosis of up to 40-50% by NASCET criteria. 3. Atheromatous plaque at the origin of the left vertebral artery with short-segment moderate to severe stenosis. Otherwise widely patent vertebrobasilar system. 4. Multifocal  atheromatous plaque within the carotid siphons with mild to moderate multifocal narrowing, right worse than left. 5. Severe coronary artery calcifications with sequelae of prior CABG. Electronically Signed   By: Jeannine Boga M.D.   On: 06/16/2016 16:28   Ct Head Wo Contrast  Result Date: 06/16/2016 CLINICAL DATA:  Headache, dizziness. EXAM: CT HEAD WITHOUT CONTRAST TECHNIQUE: Contiguous axial images were obtained from the base of the skull through the vertex without intravenous contrast. COMPARISON:  None. FINDINGS: Brain: No evidence of acute infarction, hemorrhage, hydrocephalus, extra-axial collection or mass lesion/mass effect. Vascular: Atherosclerosis of carotid siphons is noted. Skull: Normal. Negative for fracture or focal lesion. Sinuses/Orbits: No acute finding. Other: None. IMPRESSION: No acute intracranial abnormality seen. Electronically Signed   By: Marijo Conception, M.D.   On: 06/16/2016 10:13   Ct Angio Neck W And/or Wo Contrast  Result Date: 06/16/2016 CLINICAL DATA:  Ischial evaluation for acute confusion, abnormal MRI. EXAM: CT ANGIOGRAPHY HEAD AND NECK TECHNIQUE: Multidetector CT imaging of the head and neck was performed using the standard protocol during bolus administration of intravenous contrast. Multiplanar CT image reconstructions and MIPs were obtained to evaluate the vascular anatomy. Carotid stenosis measurements (when applicable) are obtained utilizing NASCET criteria, using the distal internal carotid diameter as the denominator. CONTRAST:  75 cc of Isovue 370. COMPARISON:  Prior CT and MRI from earlier the same day. FINDINGS: CTA NECK FINDINGS Aortic arch: Visualized aortic arch of normal caliber with normal branch pattern. No flow-limiting stenosis about the origin of the great vessels. Visualized subclavian arteries widely patent. Right carotid system: Right common carotid artery patent from its origin to the bifurcation. Severe atheromatous stenosis at the proximal  right ICA with essentially complete occlusion (greater than 90%). Probable faint string of contrast traverses this stenosis. Stenosis begins at the bifurcation and measures approximately 2 cm in length. Superimposed probable penetrating plaque noted at the distal margin of this stenosis (series 6, image 215). Distally, right ICA AA is markedly diminutive and attenuated as compared to the left, but is patent to the skullbase. No other focal high-grade stenosis within the right ICA. Right external carotid artery and its branches are widely patent. Left carotid system: Left common carotid artery patent from  its origin to the bifurcation. Calcified atheromatous plaque about the left bifurcation/ proximal left ICA. Associated short-segment stenosis of up to 40-50% by NASCET criteria (Series 6, image 224). Left ICA widely patent distally to the skullbase. Vertebral arteries: Both of the vertebral arteries arise from the subclavian arteries. Right vertebral artery slightly dominant and widely patent within the neck without stenosis. Atheromatous plaque at the origin of the left vertebral artery with moderate to severe short-segment stenosis. Left vertebral artery otherwise widely patent within the neck. Skeleton: The no acute osseus abnormality. No worrisome lytic or blastic osseous lesions. Mild degenerative spondylolysis noted at C5-6. Median sternotomy wires noted. Other neck: No other acute soft tissue abnormality within the neck. Thyroid normal. Salivary glands within normal limits. No adenopathy. Upper chest: Visualized upper chest demonstrates no acute abnormality. Sequela prior CABG noted. Diffuse 3 vessel coronary artery calcifications partially visualized. Partially visualized lungs are clear. Review of the MIP images confirms the above findings CTA HEAD FINDINGS Anterior circulation: Right ICA is diminutive and attenuated two to level of the terminus. Scattered atheromatous plaque within the cavernous right ICA  with mild to moderate multifocal narrowing. Petrous left ICA widely patent. Mild scattered plaque within the cavernous left ICA without flow-limiting stenosis. Left ICA terminus widely patent. A1 segments widely patent. Anterior communicating artery patent and within normal limits. Anterior cerebral arteries patent to their distal aspects. M1 segments patent without stenosis or occlusion. MCA bifurcations are within normal limits. No proximal M2 occlusion. Distal MCA branches well opacified and symmetric. Posterior circulation: Vertebral arteries patent to the vertebrobasilar junction without stenosis. Posterior inferior cerebral arteries patent bilaterally. Basilar artery widely patent. Superior cerebral arteries patent bilaterally. Both of the posterior cerebral arteries primarily supplied via the basilar and are widely patent to their distal aspects. Small right posterior communicating artery noted. Venous sinuses: Patent. Anatomic variants: No significant anatomic variant. No aneurysm or vascular malformation. Delayed phase: No pathologic enhancement. Review of the MIP images confirms the above findings IMPRESSION: 1. Severe near occlusive stenosis at the origin of the right internal carotid artery. Stenosis begins at the bifurcation and measures approximately 2 cm in length. Right ICA is diminutive and attenuated distally to the level of the terminus. Right MCA and its branches are well perfused via flow across the circle of Willis. 2. Atheromatous plaque at the proximal left ICA with associated stenosis of up to 40-50% by NASCET criteria. 3. Atheromatous plaque at the origin of the left vertebral artery with short-segment moderate to severe stenosis. Otherwise widely patent vertebrobasilar system. 4. Multifocal atheromatous plaque within the carotid siphons with mild to moderate multifocal narrowing, right worse than left. 5. Severe coronary artery calcifications with sequelae of prior CABG. Electronically  Signed   By: Jeannine Boga M.D.   On: 06/16/2016 16:28   Mr Brain Wo Contrast  Result Date: 06/16/2016 CLINICAL DATA:  Initial evaluation for acute confusion. EXAM: MRI HEAD WITHOUT CONTRAST TECHNIQUE: Multiplanar, multiecho pulse sequences of the brain and surrounding structures were obtained without intravenous contrast. COMPARISON:  Prior CT from earlier the same day. FINDINGS: Brain: Age-appropriate cerebral atrophy. Few scattered patchy T2/FLAIR hyperintense foci noted within the periventricular and deep white matter both cerebral hemispheres, nonspecific, but most like related chronic small vessel ischemic disease. Overall, changes felt to be within normal limits for age. No abnormal foci of restricted diffusion to suggest acute or subacute ischemia. Gray-white matter differentiation maintained. No encephalomalacia to suggest chronic infarction. Single punctate focus of susceptibility artifact at the posterior  right parietal lobe, of doubtful significance in isolation. No other evidence for acute or chronic intracranial hemorrhage. No mass lesion, midline shift or mass effect. No hydrocephalus. No extra-axial fluid collection. Major dural sinuses are grossly patent. Pituitary gland suprasellar region within normal limits. Midline structures intact and normal. Vascular: Abnormal flow void within the right ICA to the level of the terminus, which may be related to occlusion and/or slow flow. Intracranial vascular flow voids are otherwise maintained. Normal flow void is seen within the right MCA and its branches. Skull and upper cervical spine: Craniocervical junction within normal limits. Visualized upper cervical spine unremarkable. Bone marrow signal intensity within normal limits. No scalp soft tissue abnormality. Sinuses/Orbits: Globes normal soft tissues within normal limits. Scattered mucosal thickening throughout the paranasal sinuses. Few small superimposed retention cysts noted within left  maxillary sinus. No air-fluid level to suggest active sinus infection. No mastoid effusion. Inner ear structures normal. Other: None. IMPRESSION: 1. No acute intracranial process identified. 2. Abnormal flow void within the right ICA to the level of the terminus, which may be related to slow flow and/or occlusion. 3. Otherwise normal brain MRI for age. Electronically Signed   By: Jeannine Boga M.D.   On: 06/16/2016 14:31      Assessment/Plan 1. TIA. From carotid stenosis most likely.  See treatment plan as below. ASA and statin agent. MRI was negative for acute stroke. 2. High grade, near occlusive right carotid artery stenosis.  This is likely the source of his TIA and would recommend repair. Have discussed carotid endarterectomy versus carotid artery stenting. Would recommend carotid endarterectomy given the small size is distal internal carotid artery this would likely make extending more high risk. I also discussed that it is possible that we will find a complete occlusion at the time of surgery in which case carotid ligation was planned. I have offered him carotid endarterectomy either this Friday or next Wednesday. He would prefer to go home and have this performed next Wednesday which is certainly reasonable. 3. Coronary artery disease. Status post coronary artery bypass grafting. Currently stable. 4. Hypertension. Stable on outpatient medications and blood pressure control important in reducing the progression of atherosclerotic disease. On appropriate oral medications. 5. Hyperlipidemia. lipid control important in reducing the progression of atherosclerotic disease. Continue statin therapy     Leotis Pain, MD  06/17/2016 3:39 PM    This note was created with Dragon medical transcription system.  Any error is purely unintentional

## 2016-06-19 ENCOUNTER — Inpatient Hospital Stay: Admission: RE | Admit: 2016-06-19 | Payer: Commercial Managed Care - HMO | Source: Ambulatory Visit

## 2016-06-19 ENCOUNTER — Telehealth (INDEPENDENT_AMBULATORY_CARE_PROVIDER_SITE_OTHER): Payer: Self-pay

## 2016-06-19 NOTE — Telephone Encounter (Signed)
Contacted the patient because Larena Glassman from pre-op called and stated the patient was a no-show for his pre-op today and that he called yesterday and rescheduled his appt time because it was to early in the morning. I explained to his spouse that if he doesn't have pre-op he will not have surgery, I did leave the number to call pre-op again.

## 2016-06-22 ENCOUNTER — Encounter
Admission: RE | Admit: 2016-06-22 | Discharge: 2016-06-22 | Disposition: A | Discharge status: Home / Self Care | Payer: Medicare HMO | Source: Ambulatory Visit | Attending: Vascular Surgery | Admitting: Vascular Surgery

## 2016-06-22 ENCOUNTER — Other Ambulatory Visit (INDEPENDENT_AMBULATORY_CARE_PROVIDER_SITE_OTHER): Payer: Self-pay | Admitting: Vascular Surgery

## 2016-06-22 DIAGNOSIS — Z01810 Encounter for preprocedural cardiovascular examination: Secondary | ICD-10-CM | POA: Diagnosis not present

## 2016-06-22 HISTORY — DX: Dyspnea, unspecified: R06.00

## 2016-06-22 HISTORY — DX: Transient cerebral ischemic attack, unspecified: G45.9

## 2016-06-22 LAB — BASIC METABOLIC PANEL
ANION GAP: 7 (ref 5–15)
BUN: 10 mg/dL (ref 6–20)
CALCIUM: 8.8 mg/dL — AB (ref 8.9–10.3)
CO2: 27 mmol/L (ref 22–32)
Chloride: 105 mmol/L (ref 101–111)
Creatinine, Ser: 1.07 mg/dL (ref 0.61–1.24)
GLUCOSE: 106 mg/dL — AB (ref 65–99)
POTASSIUM: 3.8 mmol/L (ref 3.5–5.1)
SODIUM: 139 mmol/L (ref 135–145)

## 2016-06-22 LAB — CBC WITH DIFFERENTIAL/PLATELET
BASOS ABS: 0.1 10*3/uL (ref 0–0.1)
BASOS PCT: 1 %
Eosinophils Absolute: 0.2 10*3/uL (ref 0–0.7)
Eosinophils Relative: 3 %
HCT: 37.3 % — ABNORMAL LOW (ref 40.0–52.0)
Hemoglobin: 12.9 g/dL — ABNORMAL LOW (ref 13.0–18.0)
LYMPHS PCT: 16 %
Lymphs Abs: 1.4 10*3/uL (ref 1.0–3.6)
MCH: 29.9 pg (ref 26.0–34.0)
MCHC: 34.5 g/dL (ref 32.0–36.0)
MCV: 86.5 fL (ref 80.0–100.0)
Monocytes Absolute: 0.5 10*3/uL (ref 0.2–1.0)
Monocytes Relative: 6 %
NEUTROS ABS: 6.7 10*3/uL — AB (ref 1.4–6.5)
Neutrophils Relative %: 74 %
PLATELETS: 186 10*3/uL (ref 150–440)
RBC: 4.31 MIL/uL — AB (ref 4.40–5.90)
RDW: 13.1 % (ref 11.5–14.5)
WBC: 9 10*3/uL (ref 3.8–10.6)

## 2016-06-22 LAB — TYPE AND SCREEN
ABO/RH(D): O POS
ANTIBODY SCREEN: NEGATIVE

## 2016-06-22 LAB — PROTIME-INR
INR: 1.04
Prothrombin Time: 13.6 seconds (ref 11.4–15.2)

## 2016-06-22 LAB — SURGICAL PCR SCREEN
MRSA, PCR: NEGATIVE
Staphylococcus aureus: NEGATIVE

## 2016-06-22 LAB — APTT: aPTT: 31 seconds (ref 24–36)

## 2016-06-22 MED ORDER — CHLORHEXIDINE GLUCONATE CLOTH 2 % EX PADS
6.0000 | MEDICATED_PAD | Freq: Once | CUTANEOUS | Status: DC
  Filled 2016-06-22: qty 6

## 2016-06-22 MED ORDER — CHLORHEXIDINE GLUCONATE CLOTH 2 % EX PADS
6.0000 | MEDICATED_PAD | Freq: Once | CUTANEOUS | Status: DC
Start: 2016-06-22 — End: 2016-06-23
  Filled 2016-06-22: qty 6

## 2016-06-22 NOTE — Patient Instructions (Signed)
Your procedure is scheduled on: 06/24/16 Wed Report to Same Day Surgery 2nd floor medical mall [facility] Entrance-take elevator on left to 2nd floor.  Check in with surgery information desk.) To find out your arrival time please call [phone] between 1PM - 3PM on 06/23/16 Tues  Remember: Instructions that are not followed completely may result in serious medical risk, up to and including death, or upon the discretion of your surgeon and anesthesiologist your surgery may need to be rescheduled.    _x___ 1. Do not eat food or drink liquids after midnight. No gum chewing or                              hard candies.     __x__ 2. No Alcohol for 24 hours before or after surgery.   __x__3. No Smoking for 24 prior to surgery.   ____  4. Bring all medications with you on the day of surgery if instructed.    __x__ 5. Notify your doctor if there is any change in your medical condition     (cold, fever, infections).     Do not wear jewelry, make-up, hairpins, clips or nail polish.  Do not wear lotions, powders, or perfumes. You may wear deodorant.  Do not shave 48 hours prior to surgery. Men may shave face and neck.  Do not bring valuables to the hospital.    [facility] is not responsible for any belongings or valuables.               Contacts, dentures or bridgework may not be worn into surgery.  Leave your suitcase in the car. After surgery it may be brought to your room.  For patients admitted to the hospital, discharge time is determined by your                       treatment team.   Patients discharged the day of surgery will not be allowed to drive home.  You will need someone to drive you home and stay with you the night of your procedure.    Please read over the following fact sheets that you were given:   [facility] Preparing for Surgery and or MRSA Information   _x___ Take anti-hypertensive (unless it includes a diuretic), cardiac, seizure, asthma,     anti-reflux and  psychiatric medicines. These include:  1. lisinopril (PRINIVIL,ZESTRIL  2.metoprolol succinate   3.  4.  5.  6.  ____Fleets enema or Magnesium Citrate as directed.   _x___ Use CHG Soap or sage wipes as directed on instruction sheet   ____ Use inhalers on the day of surgery and bring to hospital day of surgery  ____ Stop Metformin and Janumet 2 days prior to surgery.    ____ Take 1/2 of usual insulin dose the night before surgery and none on the morning     surgery.   _x___ Follow recommendations from Cardiologist, Pulmonologist or PCP regarding          stopping Aspirin, Coumadin, Pllavix ,Eliquis, Effient, or Pradaxa, and Pletal.  X____Stop Anti-inflammatories such as Advil, Aleve, Ibuprofen, Motrin, Naproxen, Naprosyn, Goodies powders or aspirin products. OK to take Tylenol and                          Celebrex.   _x___ Stop supplements until after surgery.  But may continue Vitamin  D, Vitamin B,       and multivitamin.   ____ Bring C-Pap to the hospital.

## 2016-06-24 ENCOUNTER — Inpatient Hospital Stay: Payer: Medicare HMO | Admitting: Anesthesiology

## 2016-06-24 ENCOUNTER — Encounter: Payer: Self-pay | Admitting: *Deleted

## 2016-06-24 ENCOUNTER — Inpatient Hospital Stay
Admission: RE | Admit: 2016-06-24 | Discharge: 2016-06-25 | DRG: 039 | Disposition: A | Discharge status: Home / Self Care | Payer: Medicare HMO | Source: Ambulatory Visit | Attending: Vascular Surgery | Admitting: Vascular Surgery

## 2016-06-24 ENCOUNTER — Encounter
Admission: RE | Disposition: A | Discharge status: Home / Self Care | Payer: Self-pay | Source: Ambulatory Visit | Attending: Vascular Surgery

## 2016-06-24 DIAGNOSIS — R69 Illness, unspecified: Secondary | ICD-10-CM | POA: Diagnosis not present

## 2016-06-24 DIAGNOSIS — F419 Anxiety disorder, unspecified: Secondary | ICD-10-CM | POA: Diagnosis present

## 2016-06-24 DIAGNOSIS — E785 Hyperlipidemia, unspecified: Secondary | ICD-10-CM | POA: Diagnosis present

## 2016-06-24 DIAGNOSIS — Z8673 Personal history of transient ischemic attack (TIA), and cerebral infarction without residual deficits: Secondary | ICD-10-CM

## 2016-06-24 DIAGNOSIS — I10 Essential (primary) hypertension: Secondary | ICD-10-CM | POA: Diagnosis present

## 2016-06-24 DIAGNOSIS — Z951 Presence of aortocoronary bypass graft: Secondary | ICD-10-CM | POA: Diagnosis not present

## 2016-06-24 DIAGNOSIS — I6521 Occlusion and stenosis of right carotid artery: Secondary | ICD-10-CM | POA: Diagnosis not present

## 2016-06-24 DIAGNOSIS — I252 Old myocardial infarction: Secondary | ICD-10-CM

## 2016-06-24 DIAGNOSIS — I2510 Atherosclerotic heart disease of native coronary artery without angina pectoris: Secondary | ICD-10-CM | POA: Diagnosis present

## 2016-06-24 DIAGNOSIS — I25810 Atherosclerosis of coronary artery bypass graft(s) without angina pectoris: Secondary | ICD-10-CM | POA: Diagnosis not present

## 2016-06-24 HISTORY — PX: ENDARTERECTOMY: SHX5162

## 2016-06-24 LAB — GLUCOSE, CAPILLARY: GLUCOSE-CAPILLARY: 97 mg/dL (ref 65–99)

## 2016-06-24 LAB — ABO/RH: ABO/RH(D): O POS

## 2016-06-24 SURGERY — ENDARTERECTOMY, CAROTID
Anesthesia: General | Site: Neck | Laterality: Right | Wound class: Clean

## 2016-06-24 MED ORDER — LIDOCAINE HCL (CARDIAC) 20 MG/ML IV SOLN
INTRAVENOUS | Status: DC | PRN
  Administered 2016-06-24: 100 mg via INTRAVENOUS

## 2016-06-24 MED ORDER — CEFAZOLIN SODIUM-DEXTROSE 2-4 GM/100ML-% IV SOLN
INTRAVENOUS | Status: AC
  Filled 2016-06-24: qty 100

## 2016-06-24 MED ORDER — OXYCODONE-ACETAMINOPHEN 5-325 MG PO TABS
1.0000 | ORAL_TABLET | ORAL | Status: DC | PRN
  Administered 2016-06-24 – 2016-06-25 (×4): 2 via ORAL
  Filled 2016-06-24 (×5): qty 2

## 2016-06-24 MED ORDER — LABETALOL HCL 5 MG/ML IV SOLN
INTRAVENOUS | Status: AC
  Filled 2016-06-24: qty 4

## 2016-06-24 MED ORDER — LIDOCAINE HCL (PF) 2 % IJ SOLN
INTRAMUSCULAR | Status: AC
  Filled 2016-06-24: qty 2

## 2016-06-24 MED ORDER — ONDANSETRON HCL 4 MG/2ML IJ SOLN
INTRAMUSCULAR | Status: AC
  Filled 2016-06-24: qty 2

## 2016-06-24 MED ORDER — ONDANSETRON HCL 4 MG/2ML IJ SOLN: 4.0000 mg | Freq: Once | INTRAMUSCULAR | Status: DC | PRN

## 2016-06-24 MED ORDER — SUCCINYLCHOLINE CHLORIDE 20 MG/ML IJ SOLN
INTRAMUSCULAR | Status: DC | PRN
  Administered 2016-06-24: 100 mg via INTRAVENOUS

## 2016-06-24 MED ORDER — METOPROLOL TARTRATE 5 MG/5ML IV SOLN: 2.0000 mg | INTRAVENOUS | Status: DC | PRN

## 2016-06-24 MED ORDER — HEPARIN SODIUM (PORCINE) 1000 UNIT/ML IJ SOLN
INTRAMUSCULAR | Status: AC
  Filled 2016-06-24: qty 1

## 2016-06-24 MED ORDER — CEFAZOLIN SODIUM 1 G IJ SOLR
INTRAMUSCULAR | Status: AC
  Filled 2016-06-24: qty 10

## 2016-06-24 MED ORDER — LISINOPRIL 5 MG PO TABS
5.0000 mg | ORAL_TABLET | Freq: Every day | ORAL | Status: DC
  Administered 2016-06-25: 5 mg via ORAL
  Filled 2016-06-24: qty 1

## 2016-06-24 MED ORDER — LABETALOL HCL 5 MG/ML IV SOLN: 10.0000 mg | INTRAVENOUS | Status: DC | PRN

## 2016-06-24 MED ORDER — MORPHINE SULFATE (PF) 2 MG/ML IV SOLN
2.0000 mg | INTRAVENOUS | Status: DC | PRN
  Administered 2016-06-24: 4 mg via INTRAVENOUS
  Administered 2016-06-24: 2 mg via INTRAVENOUS
  Administered 2016-06-25: 4 mg via INTRAVENOUS
  Filled 2016-06-24 (×2): qty 2

## 2016-06-24 MED ORDER — GLYCOPYRROLATE 0.2 MG/ML IJ SOLN
INTRAMUSCULAR | Status: DC | PRN
  Administered 2016-06-24: 0.2 mg via INTRAVENOUS

## 2016-06-24 MED ORDER — DEXAMETHASONE SODIUM PHOSPHATE 10 MG/ML IJ SOLN
INTRAMUSCULAR | Status: DC | PRN
  Administered 2016-06-24: 10 mg via INTRAVENOUS

## 2016-06-24 MED ORDER — DEXTROSE 5 % IV SOLN
1.5000 g | Freq: Two times a day (BID) | INTRAVENOUS | Status: AC
  Administered 2016-06-24 – 2016-06-25 (×2): 1.5 g via INTRAVENOUS
  Filled 2016-06-24 (×2): qty 1.5

## 2016-06-24 MED ORDER — FENTANYL CITRATE (PF) 100 MCG/2ML IJ SOLN
50.0000 ug | Freq: Once | INTRAMUSCULAR | Status: AC
  Administered 2016-06-24: 50 ug via INTRAVENOUS

## 2016-06-24 MED ORDER — FENTANYL CITRATE (PF) 100 MCG/2ML IJ SOLN
INTRAMUSCULAR | Status: AC
  Administered 2016-06-24: 25 ug via INTRAVENOUS
  Filled 2016-06-24: qty 2

## 2016-06-24 MED ORDER — SODIUM CHLORIDE 0.9 % IV SOLN
INTRAVENOUS | Status: DC | PRN
  Administered 2016-06-24: 30 ug/min via INTRAVENOUS

## 2016-06-24 MED ORDER — MORPHINE SULFATE (PF) 2 MG/ML IV SOLN
INTRAVENOUS | Status: AC
  Administered 2016-06-24: 2 mg via INTRAVENOUS
  Filled 2016-06-24: qty 1

## 2016-06-24 MED ORDER — FENTANYL CITRATE (PF) 100 MCG/2ML IJ SOLN
25.0000 ug | INTRAMUSCULAR | Status: AC | PRN
  Administered 2016-06-24 (×6): 25 ug via INTRAVENOUS

## 2016-06-24 MED ORDER — ROCURONIUM BROMIDE 50 MG/5ML IV SOLN
INTRAVENOUS | Status: AC
  Filled 2016-06-24: qty 1

## 2016-06-24 MED ORDER — MIDAZOLAM HCL 2 MG/2ML IJ SOLN
INTRAMUSCULAR | Status: DC | PRN
  Administered 2016-06-24: 2 mg via INTRAVENOUS

## 2016-06-24 MED ORDER — FAMOTIDINE 20 MG PO TABS
20.0000 mg | ORAL_TABLET | Freq: Once | ORAL | Status: AC
  Administered 2016-06-24: 20 mg via ORAL

## 2016-06-24 MED ORDER — ACETAMINOPHEN 325 MG PO TABS: 325.0000 mg | ORAL_TABLET | ORAL | Status: DC | PRN

## 2016-06-24 MED ORDER — GUAIFENESIN-DM 100-10 MG/5ML PO SYRP: 15.0000 mL | ORAL_SOLUTION | ORAL | Status: DC | PRN

## 2016-06-24 MED ORDER — FENTANYL CITRATE (PF) 100 MCG/2ML IJ SOLN
INTRAMUSCULAR | Status: DC | PRN
  Administered 2016-06-24: 100 ug via INTRAVENOUS
  Administered 2016-06-24: 50 ug via INTRAVENOUS

## 2016-06-24 MED ORDER — FENTANYL CITRATE (PF) 100 MCG/2ML IJ SOLN
INTRAMUSCULAR | Status: AC
  Filled 2016-06-24: qty 2

## 2016-06-24 MED ORDER — PHENYLEPHRINE HCL 10 MG/ML IJ SOLN
INTRAMUSCULAR | Status: AC
  Filled 2016-06-24: qty 1

## 2016-06-24 MED ORDER — LIDOCAINE HCL 1 % IJ SOLN
INTRAMUSCULAR | Status: DC | PRN
  Administered 2016-06-24: 10 mL

## 2016-06-24 MED ORDER — FAMOTIDINE IN NACL 20-0.9 MG/50ML-% IV SOLN
20.0000 mg | Freq: Two times a day (BID) | INTRAVENOUS | Status: DC
  Administered 2016-06-24 – 2016-06-25 (×2): 20 mg via INTRAVENOUS
  Filled 2016-06-24 (×2): qty 50

## 2016-06-24 MED ORDER — METOPROLOL SUCCINATE ER 25 MG PO TB24
12.5000 mg | ORAL_TABLET | Freq: Every day | ORAL | Status: DC
  Administered 2016-06-25: 12.5 mg via ORAL
  Filled 2016-06-24: qty 1

## 2016-06-24 MED ORDER — ONDANSETRON HCL 4 MG/2ML IJ SOLN: 4.0000 mg | Freq: Four times a day (QID) | INTRAMUSCULAR | Status: DC | PRN

## 2016-06-24 MED ORDER — SODIUM CHLORIDE 0.9 % IV SOLN
INTRAVENOUS | Status: DC
  Administered 2016-06-24: 19:00:00 via INTRAVENOUS

## 2016-06-24 MED ORDER — ROCURONIUM BROMIDE 100 MG/10ML IV SOLN
INTRAVENOUS | Status: DC | PRN
  Administered 2016-06-24: 10 mg via INTRAVENOUS
  Administered 2016-06-24: 40 mg via INTRAVENOUS
  Administered 2016-06-24: 10 mg via INTRAVENOUS

## 2016-06-24 MED ORDER — DOCUSATE SODIUM 100 MG PO CAPS
100.0000 mg | ORAL_CAPSULE | Freq: Every day | ORAL | Status: DC
  Administered 2016-06-25: 100 mg via ORAL
  Filled 2016-06-24: qty 1

## 2016-06-24 MED ORDER — HEPARIN SODIUM (PORCINE) 1000 UNIT/ML IJ SOLN
INTRAMUSCULAR | Status: DC | PRN
  Administered 2016-06-24: 5000 [IU] via INTRAVENOUS

## 2016-06-24 MED ORDER — SODIUM CHLORIDE 0.9 % IV SOLN: 500.0000 mL | Freq: Once | INTRAVENOUS | Status: DC | PRN

## 2016-06-24 MED ORDER — MAGNESIUM SULFATE 2 GM/50ML IV SOLN: 2.0000 g | Freq: Every day | INTRAVENOUS | Status: DC | PRN

## 2016-06-24 MED ORDER — ESMOLOL HCL-SODIUM CHLORIDE 2000 MG/100ML IV SOLN: 25.0000 ug/kg/min | INTRAVENOUS | Status: DC

## 2016-06-24 MED ORDER — NITROGLYCERIN IN D5W 200-5 MCG/ML-% IV SOLN: 5.0000 ug/min | INTRAVENOUS | Status: DC

## 2016-06-24 MED ORDER — SUGAMMADEX SODIUM 200 MG/2ML IV SOLN
INTRAVENOUS | Status: AC
  Filled 2016-06-24: qty 2

## 2016-06-24 MED ORDER — SUGAMMADEX SODIUM 200 MG/2ML IV SOLN
INTRAVENOUS | Status: DC | PRN
  Administered 2016-06-24: 190 mg via INTRAVENOUS

## 2016-06-24 MED ORDER — CEFAZOLIN SODIUM-DEXTROSE 2-4 GM/100ML-% IV SOLN
2.0000 g | INTRAVENOUS | Status: AC
  Administered 2016-06-24: 2 g via INTRAVENOUS

## 2016-06-24 MED ORDER — LACTATED RINGERS IV SOLN
INTRAVENOUS | Status: DC
  Administered 2016-06-24 (×2): via INTRAVENOUS

## 2016-06-24 MED ORDER — PROPOFOL 10 MG/ML IV BOLUS
INTRAVENOUS | Status: DC | PRN
  Administered 2016-06-24: 120 mg via INTRAVENOUS

## 2016-06-24 MED ORDER — PHENOL 1.4 % MT LIQD
1.0000 | OROMUCOSAL | Status: DC | PRN
  Filled 2016-06-24: qty 177

## 2016-06-24 MED ORDER — ASPIRIN EC 81 MG PO TBEC
81.0000 mg | DELAYED_RELEASE_TABLET | Freq: Every day | ORAL | Status: DC
  Administered 2016-06-24 – 2016-06-25 (×2): 81 mg via ORAL
  Filled 2016-06-24 (×2): qty 1

## 2016-06-24 MED ORDER — CLOPIDOGREL BISULFATE 75 MG PO TABS
75.0000 mg | ORAL_TABLET | Freq: Every day | ORAL | Status: DC
  Administered 2016-06-25: 75 mg via ORAL
  Filled 2016-06-24: qty 1

## 2016-06-24 MED ORDER — EVICEL 2 ML EX KIT
PACK | CUTANEOUS | Status: DC | PRN
  Administered 2016-06-24: 2 mL

## 2016-06-24 MED ORDER — ATORVASTATIN CALCIUM 20 MG PO TABS
40.0000 mg | ORAL_TABLET | Freq: Every day | ORAL | Status: DC
  Administered 2016-06-24: 40 mg via ORAL
  Filled 2016-06-24: qty 2

## 2016-06-24 MED ORDER — GLYCOPYRROLATE 0.2 MG/ML IJ SOLN
INTRAMUSCULAR | Status: AC
  Filled 2016-06-24: qty 1

## 2016-06-24 MED ORDER — NITROGLYCERIN IN D5W 200-5 MCG/ML-% IV SOLN
INTRAVENOUS | Status: AC
  Filled 2016-06-24: qty 250

## 2016-06-24 MED ORDER — ONDANSETRON HCL 4 MG/2ML IJ SOLN
INTRAMUSCULAR | Status: DC | PRN
  Administered 2016-06-24 (×2): 4 mg via INTRAVENOUS

## 2016-06-24 MED ORDER — PROPOFOL 10 MG/ML IV BOLUS
INTRAVENOUS | Status: AC
  Filled 2016-06-24: qty 20

## 2016-06-24 MED ORDER — LACTATED RINGERS IV SOLN
INTRAVENOUS | Status: DC | PRN
Start: 2016-06-24 — End: 2016-06-24

## 2016-06-24 MED ORDER — DEXAMETHASONE SODIUM PHOSPHATE 10 MG/ML IJ SOLN
INTRAMUSCULAR | Status: AC
  Filled 2016-06-24: qty 1

## 2016-06-24 MED ORDER — PHENYLEPHRINE HCL 10 MG/ML IJ SOLN
INTRAMUSCULAR | Status: DC | PRN
  Administered 2016-06-24: 100 ug via INTRAVENOUS

## 2016-06-24 MED ORDER — ACETAMINOPHEN 650 MG RE SUPP: 325.0000 mg | RECTAL | Status: DC | PRN

## 2016-06-24 MED ORDER — HYDRALAZINE HCL 20 MG/ML IJ SOLN: 5.0000 mg | INTRAMUSCULAR | Status: DC | PRN

## 2016-06-24 MED ORDER — SUCCINYLCHOLINE CHLORIDE 20 MG/ML IJ SOLN
INTRAMUSCULAR | Status: AC
  Filled 2016-06-24: qty 1

## 2016-06-24 MED ORDER — ALUM & MAG HYDROXIDE-SIMETH 200-200-20 MG/5ML PO SUSP: 15.0000 mL | ORAL | Status: DC | PRN

## 2016-06-24 MED ORDER — POTASSIUM CHLORIDE CRYS ER 20 MEQ PO TBCR: 20.0000 meq | EXTENDED_RELEASE_TABLET | Freq: Every day | ORAL | Status: DC | PRN

## 2016-06-24 MED ORDER — LABETALOL HCL 5 MG/ML IV SOLN
INTRAVENOUS | Status: DC | PRN
  Administered 2016-06-24 (×3): 10 mg via INTRAVENOUS
  Administered 2016-06-24: 5 mg via INTRAVENOUS

## 2016-06-24 MED ORDER — MIDAZOLAM HCL 2 MG/2ML IJ SOLN
INTRAMUSCULAR | Status: AC
  Filled 2016-06-24: qty 2

## 2016-06-24 MED ORDER — LACTATED RINGERS IV SOLN
INTRAVENOUS | Status: DC | PRN
  Administered 2016-06-24: 15:00:00 via INTRAVENOUS

## 2016-06-24 MED ORDER — LIDOCAINE HCL (PF) 1 % IJ SOLN
INTRAMUSCULAR | Status: AC
Start: 2016-06-24 — End: 2016-06-24
  Filled 2016-06-24: qty 30

## 2016-06-24 SURGICAL SUPPLY — 60 items
BAG DECANTER FOR FLEXI CONT (MISCELLANEOUS) ×2 IMPLANT
BLADE SURG 15 STRL LF DISP TIS (BLADE) ×1 IMPLANT
BLADE SURG 15 STRL SS (BLADE) ×1
BLADE SURG SZ11 CARB STEEL (BLADE) ×2 IMPLANT
BOOT SUTURE AID YELLOW STND (SUTURE) ×2 IMPLANT
BRUSH SCRUB 4% CHG (MISCELLANEOUS) ×2 IMPLANT
CANISTER SUCT 1200ML W/VALVE (MISCELLANEOUS) ×2 IMPLANT
CATH TRAY 16F METER LATEX (MISCELLANEOUS) ×2 IMPLANT
DERMABOND ADVANCED (GAUZE/BANDAGES/DRESSINGS) ×1
DERMABOND ADVANCED .7 DNX12 (GAUZE/BANDAGES/DRESSINGS) ×1 IMPLANT
DRAPE INCISE IOBAN 66X45 STRL (DRAPES) ×2 IMPLANT
DRAPE LAPAROTOMY 77X122 PED (DRAPES) IMPLANT
DRAPE SHEET LG 3/4 BI-LAMINATE (DRAPES) ×2 IMPLANT
DRSG TEGADERM 4X4.75 (GAUZE/BANDAGES/DRESSINGS) IMPLANT
DRSG TELFA 3X8 NADH (GAUZE/BANDAGES/DRESSINGS) IMPLANT
DURAPREP 26ML APPLICATOR (WOUND CARE) ×2 IMPLANT
ELECT CAUTERY BLADE 6.4 (BLADE) ×2 IMPLANT
ELECT REM PT RETURN 9FT ADLT (ELECTROSURGICAL) ×2
ELECTRODE REM PT RTRN 9FT ADLT (ELECTROSURGICAL) ×1 IMPLANT
EVICEL AIRLESS SPRAY ACCES (MISCELLANEOUS) ×2 IMPLANT
GLOVE BIO SURGEON STRL SZ7 (GLOVE) ×6 IMPLANT
GLOVE INDICATOR 7.5 STRL GRN (GLOVE) ×2 IMPLANT
GOWN STRL REUS W/ TWL LRG LVL3 (GOWN DISPOSABLE) ×2 IMPLANT
GOWN STRL REUS W/ TWL XL LVL3 (GOWN DISPOSABLE) ×2 IMPLANT
GOWN STRL REUS W/TWL LRG LVL3 (GOWN DISPOSABLE) ×2
GOWN STRL REUS W/TWL XL LVL3 (GOWN DISPOSABLE) ×2
HEMOSTAT SURGICEL 2X3 (HEMOSTASIS) ×2 IMPLANT
IV NS 250ML (IV SOLUTION) ×1
IV NS 250ML BAXH (IV SOLUTION) ×1 IMPLANT
KIT RM TURNOVER STRD PROC AR (KITS) ×2 IMPLANT
LABEL OR SOLS (LABEL) ×2 IMPLANT
LOOP RED MAXI  1X406MM (MISCELLANEOUS) ×2
LOOP VESSEL MAXI 1X406 RED (MISCELLANEOUS) ×2 IMPLANT
LOOP VESSEL MINI 0.8X406 BLUE (MISCELLANEOUS) ×2 IMPLANT
LOOPS BLUE MINI 0.8X406MM (MISCELLANEOUS) ×2
NEEDLE FILTER BLUNT 18X 1/2SAF (NEEDLE) ×1
NEEDLE FILTER BLUNT 18X1 1/2 (NEEDLE) ×1 IMPLANT
NEEDLE HYPO 25X1 1.5 SAFETY (NEEDLE) ×2 IMPLANT
NS IRRIG 1000ML POUR BTL (IV SOLUTION) ×2 IMPLANT
PACK BASIN MAJOR ARMC (MISCELLANEOUS) ×2 IMPLANT
PATCH CAROTID ECM VASC 1X10 (Prosthesis & Implant Heart) ×2 IMPLANT
PENCIL ELECTRO HAND CTR (MISCELLANEOUS) IMPLANT
SHUNT W TPORT 9FR PRUITT F3 (SHUNT) ×2 IMPLANT
SUT MNCRL 4-0 (SUTURE)
SUT MNCRL 4-0 27XMFL (SUTURE)
SUT PROLENE 6 0 BV (SUTURE) ×8 IMPLANT
SUT PROLENE 7 0 BV 1 (SUTURE) ×8 IMPLANT
SUT SILK 2 0 (SUTURE) ×1
SUT SILK 2-0 18XBRD TIE 12 (SUTURE) ×1 IMPLANT
SUT SILK 3 0 (SUTURE) ×2
SUT SILK 3-0 18XBRD TIE 12 (SUTURE) ×1 IMPLANT
SUT SILK 4 0 (SUTURE) ×1
SUT SILK 4-0 18XBRD TIE 12 (SUTURE) ×1 IMPLANT
SUT VIC AB 3-0 SH 27 (SUTURE) ×2
SUT VIC AB 3-0 SH 27X BRD (SUTURE) ×2 IMPLANT
SUTURE MNCRL 4-0 27XMF (SUTURE) IMPLANT
SYR 20CC LL (SYRINGE) ×2 IMPLANT
SYRINGE 10CC LL (SYRINGE) ×4 IMPLANT
TOWEL OR 17X26 4PK STRL BLUE (TOWEL DISPOSABLE) IMPLANT
TUBING CONNECTING 10 (TUBING) IMPLANT

## 2016-06-24 NOTE — Op Note (Signed)
[facility]   OPERATIVE NOTE  PROCEDURE:   1.  Right carotid endarterectomy with CorMatrix arterial patch reconstruction  PRE-OPERATIVE DIAGNOSIS: 1.  High grade right carotid stenosis 2.  Recent TIA  POST-OPERATIVE DIAGNOSIS: same as above   SURGEON: Leotis Pain, MD  ASSISTANT(S): none  ANESTHESIA: general  ESTIMATED BLOOD LOSS: 50 cc  FINDING(S): 1.  Right carotid plaque.  SPECIMEN(S):  Carotid plaque (sent to Pathology)  INDICATIONS:   Jacob [name] is a 66 y.o. male who presents with right carotid stenosis of greater than 90%.  I discussed with the patient the risks, benefits, and alternatives to carotid endarterectomy.  I discussed the differences between carotid stenting and carotid endarterectomy. I discussed the procedural details of carotid endarterectomy with the patient.  The patient is aware that the risks of carotid endarterectomy include but are not limited to: bleeding, infection, stroke, myocardial infarction, death, cranial nerve injuries both temporary and permanent, neck hematoma, possible airway compromise, labile blood pressure post-operatively, cerebral hyperperfusion syndrome, and possible need for additional interventions in the future. The patient is aware of the risks and agrees to proceed forward with the procedure.  DESCRIPTION: After full informed written consent was obtained from the patient, the patient was brought back to the operating room and placed supine upon the operating table.  Prior to induction, the patient received IV antibiotics.  After obtaining adequate anesthesia, the patient was placed into a modified beach chair position with a shoulder roll in place and the patient's neck slightly hyperextended and rotated away from the surgical site.  The patient was prepped in the standard fashion for a carotid endarterectomy.  I made an incision anterior to the sternocleidomastoid muscle and dissected down through the  subcutaneous tissue.  The platysmas was opened with electrocautery.  Then I dissected down to the internal jugular vein and facial vein.  The facial vein is ligated and divided between 2-0 silk ties.  This was dissected posteriorly until I obtained visualization of the common carotid artery.  This was dissected out and then a vessel loop was placed around the common carotid artery.  I then dissected in a periadventitial fashion along the common carotid artery up to the bifurcation.  I then identified the external carotid artery and the superior thyroid artery.  I placed a vessel loop around the superior thyroid artery, and I also dissected out the external carotid artery and placed a vessel loop around it. In the process of this dissection, the hypoglossal nerve was identified and protected from harm.  I then dissected out the internal carotid artery until I identified an area in the internal carotid artery clearly above the stenosis.  I dissected slightly distal to this area, and placed a vessel loop around the artery.  At this point, we gave the patient 5000 units of intravenous heparin.  After this was allowed to circulate for several minutes, I pulled up control on the vessel loops to clamp the internal carotid artery, external carotid artery, superior thyroid artery, and then the common carotid artery.  I then made an arteriotomy in the common carotid artery with a 11 blade, and extended the arteriotomy with a Potts scissor down into the common carotid artery, then I carried the arteriotomy through the bifurcation into the internal carotid artery until I reached an area that was not diseased.  At this point, I took the P[place] shunt that previously been prepared and I inserted it into the internal carotid artery first, and  then into the common carotid artery taking care to flush and de-air prior to release of control. At this point, I started the endarterectomy in the common carotid artery with a  Penfield elevator and carried this dissection down into the common carotid artery circumferentially.  Then I transected the plaque at a segment where it was adherent and transected the plaque with Potts scissors.  I then carried this dissection up into the external carotid artery.  The plaque was extracted by unclamping the external carotid artery and performing an eversion endarterectomy.  The dissection was then carried into the internal carotid artery where a nice feathered end point was created with gentle traction.  I passed the plaque off the field as a specimen. At this point I removed all loose flecks and remaining disease possible.  At this point, I was satisfied that the minimal remaining disease was densely adherent to the wall and wall integrity was intact. The distal endpoint was tacked down with three 7-0 Prolene sutures.  I then fashioned a CorMatrix arterial patch for the artery and sewed it in place with two running stitch of 6-0 Prolene.  I started at the distal endpoint and ran one half the length of the arteriotomy.  I then cut and beveled the patch to an appropriate length to match the arteriotomy.  I started the second 6-0 Prolene at the proximal end point.  The medial suture line was completed and the lateral suture line was run approximately one quarter the length of the arteriotomy.  Prior to completing this patch angioplasty, I removed the shunt first from the internal carotid artery, from which there was excellent backbleeding, and clamped it.  Then I removed the shunt from the common carotid artery, from which there was excellent antegrade bleeding, and then clamped it.  At this point, I allowed the external carotid artery to backbleed, which was excellent.  Then I instilled heparinized saline in this patched artery and then completed the patch angioplasty in the usual fashion.  First, I released the clamp on the external carotid artery, then I released it on the common carotid artery.   After waiting a few seconds, I then released it on the internal carotid artery. Several minutes of pressure were held and 6-0 Prolene patch sutures were used as need for hemostasis.  At this point, I placed Surgicel and Evicel topical hemostatic agents.  There was no more active bleeding in the surgical site.  The sternocleidomastoid space was closed with three interrupted 3-0 Vicryl sutures. I then reapproximated the platysma muscle with a running stitch of 3-0 Vicryl.  The skin was then closed with a running subcuticular 4-0 Monocryl.  The skin was then cleaned, dried and Dermabond was used to reinforce the skin closure.  The patient awakened and was taken to the recovery room in stable condition, following commands and moving all four extremities without any apparent deficits.    COMPLICATIONS: none  CONDITION: stable  Leotis Pain  06/24/2016, 5:26 PM    This note was created with Dragon Medical transcription system. Any errors in dictation are purely unintentional.

## 2016-06-24 NOTE — Anesthesia Procedure Notes (Signed)
Arterial Line Insertion Start/End6/06/2016 3:17 PM, 06/24/2016 3:24 PM Performed by: Emmie Niemann, anesthesiologist  Patient location: Pre-op. Preanesthetic checklist: patient identified, IV checked, site marked, risks and benefits discussed, surgical consent, monitors and equipment checked, pre-op evaluation, timeout performed and anesthesia consent radial was placed Catheter size: 20 Fr Hand hygiene performed , maximum sterile barriers used  and Seldinger technique used  Attempts: 1 Procedure performed without using ultrasound guided technique. Following insertion, dressing applied. Post procedure assessment: normal and unchanged  Patient tolerated the procedure well with no immediate complications.

## 2016-06-24 NOTE — Progress Notes (Signed)
Report to Idamae Lusher RN.

## 2016-06-24 NOTE — Transfer of Care (Signed)
Immediate Anesthesia Transfer of Care Note  Patient: Jacob [name]  Procedure(s) Performed: Procedure(s): ENDARTERECTOMY CAROTID (Right)  Patient Location: PACU  Anesthesia Type:General  Level of Consciousness: awake and patient cooperative  Airway & Oxygen Therapy: Patient Spontanous Breathing and Patient connected to face mask oxygen  Post-op Assessment: Report given to RN and Post -op Vital signs reviewed and stable  Post vital signs: Reviewed and stable  Last Vitals:  Vitals:   06/24/16 1218 06/24/16 1730  BP: 131/86 115/71  Pulse: 74 66  Resp: 16 18  Temp: 36.5 C 36.7 C    Last Pain:  Vitals:   06/24/16 1218  TempSrc: Tympanic         Complications: No apparent anesthesia complications

## 2016-06-24 NOTE — H&P (Signed)
[facility] History & Physical Update  The patient was interviewed and re-examined.  The patient's previous History and Physical has been reviewed and is unchanged.  There is no change in the plan of care. We plan to proceed with the scheduled procedure.  Leotis Pain, MD  06/24/2016, 1:37 PM

## 2016-06-24 NOTE — Anesthesia Procedure Notes (Signed)
Procedure Name: Intubation Date/Time: 06/24/2016 3:47 PM Performed by: Nelda Marseille Pre-anesthesia Checklist: Patient identified, Patient being monitored, Timeout performed, Emergency Drugs available and Suction available Patient Re-evaluated:Patient Re-evaluated prior to inductionOxygen Delivery Method: Circle system utilized Preoxygenation: Pre-oxygenation with 100% oxygen Intubation Type: IV induction Ventilation: Mask ventilation without difficulty Laryngoscope Size: Mac and 3 Grade View: Grade II Tube type: Oral Tube size: 7.5 mm Number of attempts: 1 Airway Equipment and Method: Stylet Placement Confirmation: ETT inserted through vocal cords under direct vision,  positive ETCO2 and breath sounds checked- equal and bilateral Secured at: 21 cm Tube secured with: Tape Dental Injury: Teeth and Oropharynx as per pre-operative assessment

## 2016-06-24 NOTE — Anesthesia Post-op Follow-up Note (Cosign Needed)
Anesthesia QCDR form completed.        

## 2016-06-24 NOTE — Anesthesia Preprocedure Evaluation (Addendum)
Anesthesia Evaluation  Patient identified by MRN, date of birth, ID band Patient awake    Reviewed: Allergy & Precautions, NPO status , Patient's Chart, lab work & pertinent test results  Airway Mallampati: II       Dental  (+) Teeth Intact   Pulmonary    breath sounds clear to auscultation       Cardiovascular Exercise Tolerance: Poor hypertension, Pt. on medications and Pt. on home beta blockers + CAD, + Past MI, + Cardiac Stents and + CABG   Rhythm:Regular Rate:Normal     Neuro/Psych Anxiety TIA   GI/Hepatic Neg liver ROS, PUD,   Endo/Other    Renal/GU      Musculoskeletal negative musculoskeletal ROS (+)   Abdominal Normal abdominal exam  (+)   Peds negative pediatric ROS (+)  Hematology negative hematology ROS (+)   Anesthesia Other Findings   Reproductive/Obstetrics negative OB ROS                            Anesthesia Physical Anesthesia Plan  ASA: III  Anesthesia Plan: General   Post-op Pain Management:    Induction: Intravenous  PONV Risk Score and Plan: 1 and Ondansetron and Treatment may vary due to age  Airway Management Planned:   Additional Equipment:   Intra-op Plan:   Post-operative Plan: Extubation in OR  Informed Consent: I have reviewed the patients History and Physical, chart, labs and discussed the procedure including the risks, benefits and alternatives for the proposed anesthesia with the patient or authorized representative who has indicated his/her understanding and acceptance.     Plan Discussed with: CRNA  Anesthesia Plan Comments:         Anesthesia Quick Evaluation

## 2016-06-25 LAB — BASIC METABOLIC PANEL
Anion gap: 8 (ref 5–15)
BUN: 13 mg/dL (ref 6–20)
CALCIUM: 8.7 mg/dL — AB (ref 8.9–10.3)
CO2: 27 mmol/L (ref 22–32)
CREATININE: 1.13 mg/dL (ref 0.61–1.24)
Chloride: 103 mmol/L (ref 101–111)
GFR calc non Af Amer: >60 mL/min (ref >60)
Glucose, Bld: 135 mg/dL — ABNORMAL HIGH (ref 65–99)
Potassium: 4.4 mmol/L (ref 3.5–5.1)
SODIUM: 138 mmol/L (ref 135–145)

## 2016-06-25 LAB — CBC
HCT: 36.4 % — ABNORMAL LOW (ref 40.0–52.0)
HEMOGLOBIN: 12.5 g/dL — AB (ref 13.0–18.0)
MCH: 30.5 pg (ref 26.0–34.0)
MCHC: 34.3 g/dL (ref 32.0–36.0)
MCV: 88.8 fL (ref 80.0–100.0)
PLATELETS: 184 10*3/uL (ref 150–440)
RBC: 4.1 MIL/uL — ABNORMAL LOW (ref 4.40–5.90)
RDW: 13.4 % (ref 11.5–14.5)
WBC: 9.1 10*3/uL (ref 3.8–10.6)

## 2016-06-25 MED ORDER — OXYCODONE-ACETAMINOPHEN 5-325 MG PO TABS: 1.0000 | ORAL_TABLET | ORAL | 0 refills | Status: DC | PRN

## 2016-06-25 MED ORDER — CLOPIDOGREL BISULFATE 75 MG PO TABS: 75.0000 mg | ORAL_TABLET | Freq: Every day | ORAL | 5 refills | Status: DC

## 2016-06-25 NOTE — Anesthesia Postprocedure Evaluation (Signed)
Anesthesia Post Note  Patient: Jacob [name]  Procedure(s) Performed: Procedure(s) (LRB): ENDARTERECTOMY CAROTID (Right)  Patient location during evaluation: ICU Anesthesia Type: General Level of consciousness: awake, awake and alert and oriented Pain management: pain level controlled Vital Signs Assessment: post-procedure vital signs reviewed and stable Respiratory status: spontaneous breathing and patient connected to nasal cannula oxygen Cardiovascular status: stable Postop Assessment: no headache, no backache, no signs of nausea or vomiting and adequate PO intake Anesthetic complications: no     Last Vitals:  Vitals:   06/25/16 0600 06/25/16 0700  BP: 110/68 (!) 95/57  Pulse: 76 64  Resp: (!) 21 18  Temp:      Last Pain:  Vitals:   06/25/16 0635  TempSrc:   PainSc: 4                  Darlyne Russian

## 2016-06-25 NOTE — Progress Notes (Signed)
Patient discharged to home via family. Discharge education was provided along with hard copy prescriptions. Patient and family had no further questions at the time. PIVs were removed, surgical incision was clean, dry and intact at discharge time.

## 2016-06-25 NOTE — Progress Notes (Signed)
 [facility]  Daily Progress Note   Subjective  - 1 Day Post-Op  Doing pretty well.  Has had to remain on a little oxygen but otherwise no issues. No BP issues. Neuro exam intact.  Has some headache  Objective Vitals:   06/25/16 0500 06/25/16 0600 06/25/16 0700 06/25/16 0800  BP:  110/68 (!) 95/57 129/72  Pulse: 72 76 64 67  Resp: 18 (!) 21 18 17   Temp:    98.9 F (37.2 C)  TempSrc:    Oral  SpO2: 93% 95% 94% 95%  Weight:      Height:        Intake/Output Summary (Last 24 hours) at 06/25/16 1221 Last data filed at 06/25/16 0958  Gross per 24 hour  Intake             2480 ml  Output             2600 ml  Net             -120 ml    PULM  CTAB CV  RRR VASC  Neck with minimal swelling, Incision C/D/I. Neuro exam intact  Laboratory CBC    Component Value Date/Time   WBC 9.1 06/25/2016 0501   HGB 12.5 (L) 06/25/2016 0501   HCT 36.4 (L) 06/25/2016 0501   PLT 184 06/25/2016 0501    BMET    Component Value Date/Time   NA 138 06/25/2016 0501   K 4.4 06/25/2016 0501   CL 103 06/25/2016 0501   CO2 27 06/25/2016 0501   GLUCOSE 135 (H) 06/25/2016 0501   BUN 13 06/25/2016 0501   CREATININE 1.13 06/25/2016 0501   CREATININE 1.15 11/07/2015 0903   CALCIUM 8.7 (L) 06/25/2016 0501   GFRNONAA >60 06/25/2016 0501   GFRAA >60 06/25/2016 0501    Assessment/Planning: POD #1 s/p right CEA   Likely home later today.  If oxygen levels do not improve, may need home O2    Leotis Pain  06/25/2016, 12:21 PM

## 2016-06-25 NOTE — Discharge Summary (Signed)
[facility]    Discharge Summary    Patient ID:  Jacob [name] MRN: [id number] DOB/AGE: July 02, 1950 66 y.o.  Admit date: 06/24/2016 Discharge date: 06/25/2016 Date of Surgery: 06/24/2016 Surgeon: Surgeon(s): Lucky Cowboy Erskine Squibb, MD  Admission Diagnosis: CAROTID ARTERY STENOSIS  Discharge Diagnoses:  CAROTID ARTERY STENOSIS  Secondary Diagnoses: Past Medical History:  Diagnosis Date  . Angina pectoris (HCC)    Recurrent, 1/07 stress CL showed normal perfusion  . Dyslipidemia   . Dyspnea   . ED (erectile dysfunction)   . History of blood transfusion 2005  . Hypertension   . Myocardial infarction ([place]) 2005  . Nephrolithiasis   . TIA (transient ischemic attack)   . Ulcerative colitis ([place])    Left sided    Procedure(s): ENDARTERECTOMY CAROTID  Discharged Condition: good  HPI:  Came in last week with TIA.  Nearly occluded right carotid artery.  Hospital Course:  Jacob [name] is a 66 y.o. male is S/P Right Procedure(s): ENDARTERECTOMY CAROTID Extubated: POD # 0 Physical exam: neck with minimal swelling Post-op wounds clean, dry, intact or healing well Pt. Ambulating, voiding and taking PO diet without difficulty. Pt pain controlled with PO pain meds. Labs as below Complications:none  Consults:    Significant Diagnostic Studies: CBC Lab Results  Component Value Date   WBC 9.1 06/25/2016   HGB 12.5 (L) 06/25/2016   HCT 36.4 (L) 06/25/2016   MCV 88.8 06/25/2016   PLT 184 06/25/2016    BMET    Component Value Date/Time   NA 138 06/25/2016 0501   K 4.4 06/25/2016 0501   CL 103 06/25/2016 0501   CO2 27 06/25/2016 0501   GLUCOSE 135 (H) 06/25/2016 0501   BUN 13 06/25/2016 0501   CREATININE 1.13 06/25/2016 0501   CREATININE 1.15 11/07/2015 0903   CALCIUM 8.7 (L) 06/25/2016 0501   GFRNONAA >60 06/25/2016 0501   GFRAA >60 06/25/2016 0501   COAG Lab Results  Component Value Date   INR 1.04 06/22/2016     Disposition:   Discharge to :Home Discharge Instructions    Call MD for:  redness, tenderness, or signs of infection (pain, swelling, bleeding, redness, odor or green/yellow discharge around incision site)    Complete by:  As directed    Call MD for:  severe or increased pain, loss or decreased feeling  in affected limb(s)    Complete by:  As directed    Call MD for:  temperature >100.5    Complete by:  As directed    Driving Restrictions    Complete by:  As directed    No driving for one week   Lifting restrictions    Complete by:  As directed    No lifting for one week   No dressing needed    Complete by:  As directed    Replace only if drainage present   Resume previous diet    Complete by:  As directed      Allergies as of 06/25/2016   No Known Allergies     Medication List    TAKE these medications   aspirin EC 81 MG tablet Take 1 tablet (81 mg total) by mouth daily.   atorvastatin 40 MG tablet Commonly known as:  LIPITOR Take 1 tablet (40 mg total) by mouth daily. What changed:  when to take this   clopidogrel 75 MG tablet Commonly known as:  PLAVIX Take 1 tablet (75 mg total) by mouth daily with breakfast.  Start taking on:  06/26/2016   ibuprofen 200 MG tablet Commonly known as:  ADVIL,MOTRIN Take 200 mg by mouth every 8 (eight) hours as needed (for pain).   lisinopril 5 MG tablet Commonly known as:  PRINIVIL,ZESTRIL Take 1 tablet (5 mg total) by mouth daily.   metoprolol succinate 25 MG 24 hr tablet Commonly known as:  TOPROL-XL Take 0.5 tablets (12.5 mg total) by mouth daily.   oxyCODONE-acetaminophen 5-325 MG tablet Commonly known as:  PERCOCET/ROXICET Take 1-2 tablets by mouth every 4 (four) hours as needed for moderate pain.      Verbal and written Discharge instructions given to the patient. Wound care per Discharge AVS Follow-up Information    Stegmayer, Janalyn Harder, PA-C Follow up in 2 week(s).   Specialty:  Physician Assistant Why:  for wound  check Contact information: [place] [address] [phone]           Signed: Leotis Pain, MD  06/25/2016, 12:26 PM

## 2016-06-25 NOTE — Progress Notes (Signed)
Art line removed per MD order.  Cath out intact.  Pressure held until no bleeding noted.  Dressed with gauze and silk tape.  Pt aware to call nurse if bleeding noted.  Foley cath out per MD order.  Catheter out intact.  Tolerated well.  Will continue to monitor.

## 2016-06-26 LAB — SURGICAL PATHOLOGY

## 2016-07-06 ENCOUNTER — Encounter: Payer: Self-pay | Admitting: Vascular Surgery

## 2016-07-15 ENCOUNTER — Ambulatory Visit (INDEPENDENT_AMBULATORY_CARE_PROVIDER_SITE_OTHER): Payer: Medicare HMO | Admitting: Vascular Surgery

## 2016-07-15 ENCOUNTER — Encounter (INDEPENDENT_AMBULATORY_CARE_PROVIDER_SITE_OTHER): Payer: Self-pay | Admitting: Vascular Surgery

## 2016-07-15 VITALS — BP 153/83 | HR 60 | Resp 16 | Ht 70.0 in | Wt 210.0 lb

## 2016-07-15 DIAGNOSIS — I6521 Occlusion and stenosis of right carotid artery: Secondary | ICD-10-CM

## 2016-07-15 DIAGNOSIS — I10 Essential (primary) hypertension: Secondary | ICD-10-CM

## 2016-07-15 DIAGNOSIS — E7800 Pure hypercholesterolemia, unspecified: Secondary | ICD-10-CM

## 2016-07-15 NOTE — Progress Notes (Signed)
Subjective:    Patient ID: Jacob [name], male    DOB: 03-27-1950, 66 y.o.   MRN: [id number] Chief Complaint  Patient presents with  . New Patient (Initial Visit)    wound check   Patient presents for his first post operative follow-up. He is status post a right carotid endarterectomy on 06/24/2016. Patient presents today without complaint. His postoperative course has been unremarkable. He denies any issues with his incision with the exception of some incisional pain..The patient denies experiencing Amaurosis Fugax, TIA like symptoms or focal motor deficits. Patient denies fever, nausea or vomiting.   Review of Systems  Constitutional: Negative.   HENT: Negative.   Eyes: Negative.   Respiratory: Negative.   Cardiovascular: Negative.   Gastrointestinal: Negative.   Endocrine: Negative.   Genitourinary: Negative.   Musculoskeletal: Negative.   Skin: Negative.   Allergic/Immunologic: Negative.   Neurological: Negative.   Hematological: Negative.   Psychiatric/Behavioral: Negative.       Objective:   Physical Exam  Constitutional: He is oriented to person, place, and time. He appears well-developed and well-nourished. No distress.  HENT:  Head: Normocephalic and atraumatic.  Eyes: Conjunctivae are normal. Pupils are equal, round, and reactive to light.  Neck: Normal range of motion.  Right carotid endarterectomy incision: Healing well. No carotid bruits noted. Minimal swelling.   Cardiovascular: Normal rate, regular rhythm, normal heart sounds and intact distal pulses.   Pulses:      Radial pulses are 2+ on the right side, and 2+ on the left side.  Pulmonary/Chest: Effort normal.  Musculoskeletal: Normal range of motion. He exhibits no edema.  Neurological: He is alert and oriented to person, place, and time.  Skin: Skin is warm and dry. He is not diaphoretic.  Psychiatric: He has a normal mood and affect. His behavior is normal. Judgment and thought content normal.    Vitals reviewed.  BP (!) 153/83   Pulse 60   Resp 16   Ht 5' 10"  (1.778 m)   Wt 210 lb (95.3 kg)   BMI 30.13 kg/m   Past Medical History:  Diagnosis Date  . Angina pectoris (HCC)    Recurrent, 1/07 stress CL showed normal perfusion  . Dyslipidemia   . Dyspnea   . ED (erectile dysfunction)   . History of blood transfusion 2005  . Hypertension   . Myocardial infarction ([place]) 2005  . Nephrolithiasis   . TIA (transient ischemic attack)   . Ulcerative colitis ([place])    Left sided   Social History   Social History  . Marital status: Married    Spouse name: N/A  . Number of children: N/A  . Years of education: N/A   Occupational History  . Not on file.   Social History Main Topics  . Smoking status: Never Smoker  . Smokeless tobacco: Current User  . Alcohol use No  . Drug use: No  . Sexual activity: Not on file   Other Topics Concern  . Not on file   Social History Narrative  . No narrative on file   Past Surgical History:  Procedure Laterality Date  . CARDIAC CATHETERIZATION  06/08   Patnet grafts LAD, marginal, distal right  . carpel tunnel surgery Left   . COLONOSCOPY WITH PROPOFOL N/A 10/25/2012   Procedure: COLONOSCOPY WITH PROPOFOL;  Surgeon: Garlan Fair, MD;  Location: [facility];  Service: Endoscopy;  Laterality: N/A;  . CORONARY ARTERY BYPASS GRAFT  2005   ASCVD,multivessel,S/P CABG  .  ENDARTERECTOMY Right 06/24/2016   Procedure: ENDARTERECTOMY CAROTID;  Surgeon: Algernon Huxley, MD;  Location: [facility] ORS;  Service: Vascular;  Laterality: Right;  . HERNIA REPAIR  many years ago  . KNEE ARTHROSCOPY Right 1995   Family History  Problem Relation Age of Onset  . Ovarian cancer Mother   . CAD Father   . CAD Sister   . CAD Brother   . CAD Brother   . CAD Brother    No Known Allergies     Assessment & Plan:  Patient presents for his first post operative follow-up. He is status post a right carotid endarterectomy on 06/24/2016. Patient presents  today without complaint. His postoperative course has been unremarkable. He denies any issues with his incision with the exception of some incisional pain..The patient denies experiencing Amaurosis Fugax, TIA like symptoms or focal motor deficits. Patient denies fever, nausea or vomiting.  1. Carotid stenosis, symptomatic w/o infarct, right - status post right carotid endarterectomy - New Patient is status post a right carotid endarterectomy on 06/24/2016. His postoperative course has been unremarkable Physical exam is unremarkable. Incision is healing well. Patient to follow up in 1 month for a carotid duplex to assess anatomy status post intervention. Patient to call the office sooner if he experiences any symptoms Prescription for Percocet 5/325 one to two tabs every 6 hours as needed for pain #40 given. The patient understands that this will be his last pain necrotic prescription  - VAS US CAROTID; Future  2. Pure hypercholesterolemia - stable Encouraged good control as its slows the progression of atherosclerotic disease  3. Essential hypertension, benign - stable Encouraged good control as its slows the progression of atherosclerotic disease  Current Outpatient Prescriptions on File Prior to Visit  Medication Sig Dispense Refill  . atorvastatin (LIPITOR) 40 MG tablet Take 1 tablet (40 mg total) by mouth daily. (Patient taking differently: Take 40 mg by mouth at bedtime. ) 90 tablet 2  . clopidogrel (PLAVIX) 75 MG tablet Take 1 tablet (75 mg total) by mouth daily with breakfast. 30 tablet 5  . ibuprofen (ADVIL,MOTRIN) 200 MG tablet Take 200 mg by mouth every 8 (eight) hours as needed (for pain).    Marland Kitchen lisinopril (PRINIVIL,ZESTRIL) 5 MG tablet Take 1 tablet (5 mg total) by mouth daily. 90 tablet 2  . metoprolol succinate (TOPROL-XL) 25 MG 24 hr tablet Take 0.5 tablets (12.5 mg total) by mouth daily. 45 tablet 2  . oxyCODONE-acetaminophen (PERCOCET/ROXICET) 5-325 MG tablet Take 1-2 tablets  by mouth every 4 (four) hours as needed for moderate pain. 30 tablet 0  . aspirin EC 81 MG tablet Take 1 tablet (81 mg total) by mouth daily. (Patient not taking: Reported on 07/15/2016) 90 tablet 3   No current facility-administered medications on file prior to visit.     There are no Patient Instructions on file for this visit. No Follow-up on file.   Bronwen Pendergraft A Carline Dura, PA-C

## 2016-07-27 DIAGNOSIS — Z1389 Encounter for screening for other disorder: Secondary | ICD-10-CM | POA: Diagnosis not present

## 2016-07-27 DIAGNOSIS — R69 Illness, unspecified: Secondary | ICD-10-CM | POA: Diagnosis not present

## 2016-07-27 DIAGNOSIS — Z6829 Body mass index (BMI) 29.0-29.9, adult: Secondary | ICD-10-CM | POA: Diagnosis not present

## 2016-08-11 ENCOUNTER — Encounter: Payer: Self-pay | Admitting: Vascular Surgery

## 2016-08-14 ENCOUNTER — Encounter (INDEPENDENT_AMBULATORY_CARE_PROVIDER_SITE_OTHER): Payer: Medicare HMO

## 2016-08-14 ENCOUNTER — Ambulatory Visit (INDEPENDENT_AMBULATORY_CARE_PROVIDER_SITE_OTHER): Payer: Medicare HMO | Admitting: Vascular Surgery

## 2016-08-27 ENCOUNTER — Encounter: Payer: Self-pay | Admitting: Vascular Surgery

## 2016-09-07 DIAGNOSIS — G44209 Tension-type headache, unspecified, not intractable: Secondary | ICD-10-CM | POA: Diagnosis not present

## 2016-09-07 DIAGNOSIS — Z6830 Body mass index (BMI) 30.0-30.9, adult: Secondary | ICD-10-CM | POA: Diagnosis not present

## 2016-09-07 DIAGNOSIS — R69 Illness, unspecified: Secondary | ICD-10-CM | POA: Diagnosis not present

## 2016-10-25 ENCOUNTER — Other Ambulatory Visit: Payer: Self-pay | Admitting: Cardiology

## 2016-10-26 DIAGNOSIS — J019 Acute sinusitis, unspecified: Secondary | ICD-10-CM | POA: Diagnosis not present

## 2016-11-04 DIAGNOSIS — Z23 Encounter for immunization: Secondary | ICD-10-CM | POA: Diagnosis not present

## 2016-11-04 DIAGNOSIS — Z136 Encounter for screening for cardiovascular disorders: Secondary | ICD-10-CM | POA: Diagnosis not present

## 2016-11-04 DIAGNOSIS — E669 Obesity, unspecified: Secondary | ICD-10-CM | POA: Diagnosis not present

## 2016-11-04 DIAGNOSIS — Z125 Encounter for screening for malignant neoplasm of prostate: Secondary | ICD-10-CM | POA: Diagnosis not present

## 2016-11-04 DIAGNOSIS — E785 Hyperlipidemia, unspecified: Secondary | ICD-10-CM | POA: Diagnosis not present

## 2016-11-04 DIAGNOSIS — Z6830 Body mass index (BMI) 30.0-30.9, adult: Secondary | ICD-10-CM | POA: Diagnosis not present

## 2016-11-04 DIAGNOSIS — Z0000 Encounter for general adult medical examination without abnormal findings: Secondary | ICD-10-CM | POA: Diagnosis not present

## 2016-11-29 ENCOUNTER — Other Ambulatory Visit: Payer: Self-pay | Admitting: Cardiology

## 2016-12-08 ENCOUNTER — Encounter: Payer: Self-pay | Admitting: Cardiology

## 2016-12-08 ENCOUNTER — Ambulatory Visit: Payer: Medicare HMO | Admitting: Cardiology

## 2016-12-08 VITALS — BP 124/76 | HR 65 | Ht 70.0 in | Wt 226.2 lb

## 2016-12-08 DIAGNOSIS — E7800 Pure hypercholesterolemia, unspecified: Secondary | ICD-10-CM | POA: Diagnosis not present

## 2016-12-08 DIAGNOSIS — I2510 Atherosclerotic heart disease of native coronary artery without angina pectoris: Secondary | ICD-10-CM | POA: Diagnosis not present

## 2016-12-08 DIAGNOSIS — I6521 Occlusion and stenosis of right carotid artery: Secondary | ICD-10-CM | POA: Diagnosis not present

## 2016-12-08 DIAGNOSIS — I6529 Occlusion and stenosis of unspecified carotid artery: Secondary | ICD-10-CM | POA: Insufficient documentation

## 2016-12-08 DIAGNOSIS — I10 Essential (primary) hypertension: Secondary | ICD-10-CM | POA: Diagnosis not present

## 2016-12-08 NOTE — Patient Instructions (Signed)

## 2016-12-08 NOTE — Progress Notes (Signed)
Cardiology Office Note:    Date:  12/08/2016   ID:  Jacob [name], DOB [birth year], MRN [id number]  PCP:  Philmore Pali, NP  Cardiologist:  Fransico Him, MD   Referring MD: Philmore Pali, NP   Chief Complaint  Patient presents with  . Follow-up    CAD, HTN, lipids    History of Present Illness:    Jacob [name] is a 66 y.o. male with a hx of multivessel ASCAD s/p CABG and s/p PCI of SVG to RI 2008, dyslipidemia and HTN.  He is here today for followup and is doing well.  He denies any chest pain or pressure, SOB, DOE, PND, orthopnea, LE edema, dizziness, palpitations or syncope. He is compliant with his meds and is tolerating meds with no SE.    Past Medical History:  Diagnosis Date  . CAD (coronary artery disease), native coronary artery     multivessel ASCAD s/p CABG and s/p PCI of SVG to RI 2008  . Carotid artery stenosis    95% right and 50% left s/p right CEA 06/2016  . Dyslipidemia   . Dyspnea   . ED (erectile dysfunction)   . History of blood transfusion 2005  . Hypertension   . Myocardial infarction ([place]) 2005  . Nephrolithiasis   . TIA (transient ischemic attack)   . Ulcerative colitis ([place])    Left sided    Past Surgical History:  Procedure Laterality Date  . CARDIAC CATHETERIZATION  06/08   Patnet grafts LAD, marginal, distal right  . carpel tunnel surgery Left   . COLONOSCOPY WITH PROPOFOL N/A 10/25/2012   Performed by Garlan Fair, MD at [facility]  . CORONARY ARTERY BYPASS GRAFT  2005   ASCVD,multivessel,S/P CABG  . ENDARTERECTOMY CAROTID Right 06/24/2016   Performed by Algernon Huxley, MD at [facility] ORS  . HERNIA REPAIR  many years ago  . KNEE ARTHROSCOPY Right 1995    Current Medications: Current Meds  Medication Sig  . atorvastatin (LIPITOR) 40 MG tablet Take 1 tablet (40 mg total) by mouth daily. (Patient taking differently: Take 40 mg by mouth at bedtime. )  . clopidogrel (PLAVIX) 75 MG tablet Take 1 tablet (75 mg total) by mouth daily with  breakfast.  . escitalopram (LEXAPRO) 10 MG tablet Take 10 mg daily by mouth.   Marland Kitchen ibuprofen (ADVIL,MOTRIN) 200 MG tablet Take 200 mg by mouth every 8 (eight) hours as needed (for pain).  Marland Kitchen lisinopril (PRINIVIL,ZESTRIL) 5 MG tablet TAKE ONE TABLET BY MOUTH ONCE DAILY  . metoprolol succinate (TOPROL-XL) 25 MG 24 hr tablet TAKE ONE-HALF TABLET BY MOUTH ONCE DAILY  . oxyCODONE-acetaminophen (PERCOCET/ROXICET) 5-325 MG tablet Take 1-2 tablets by mouth every 4 (four) hours as needed for moderate pain.     Allergies:   Patient has no known allergies.   Social History   Socioeconomic History  . Marital status: Married    Spouse name: None  . Number of children: None  . Years of education: None  . Highest education level: None  Social Needs  . Financial resource strain: None  . Food insecurity - worry: None  . Food insecurity - inability: None  . Transportation needs - medical: None  . Transportation needs - non-medical: None  Occupational History  . None  Tobacco Use  . Smoking status: Never Smoker  . Smokeless tobacco: Current User  Substance and Sexual Activity  . Alcohol use: No  . Drug use: No  . Sexual activity:  None  Other Topics Concern  . None  Social History Narrative  . None     Family History: The patient's family history includes CAD in his brother, brother, brother, father, and sister; Ovarian cancer in his mother.  ROS:   Please see the history of present illness.    ROS  All other systems reviewed and negative.   EKGs/Labs/Other Studies Reviewed:    The following studies were reviewed today: none  EKG:  EKG is not ordered today.    Recent Labs: 06/16/2016: ALT 20 06/25/2016: BUN 13; Creatinine, Ser 1.13; Hemoglobin 12.5; Platelets 184; Potassium 4.4; Sodium 138   Recent Lipid Panel    Component Value Date/Time   CHOL 127 06/17/2016 0523   TRIG 111 06/17/2016 0523   HDL 39 (L) 06/17/2016 0523   CHOLHDL 3.3 06/17/2016 0523   VLDL 22 06/17/2016 0523    LDLCALC 66 06/17/2016 0523   LDLDIRECT 71 01/24/2013 0914    Physical Exam:    VS:  BP 124/76   Pulse 65   Ht 5' 10"  (1.778 m)   Wt 226 lb 3.2 oz (102.6 kg)   SpO2 96%   BMI 32.46 kg/m     Wt Readings from Last 3 Encounters:  12/08/16 226 lb 3.2 oz (102.6 kg)  07/15/16 210 lb (95.3 kg)  06/24/16 165 lb 2 oz (74.9 kg)     GEN:  Well nourished, well developed in no acute distress HEENT: Normal NECK: No JVD; No carotid bruits LYMPHATICS: No lymphadenopathy CARDIAC: RRR, no murmurs, rubs, gallops RESPIRATORY:  Clear to auscultation without rales, wheezing or rhonchi  ABDOMEN: Soft, non-tender, non-distended MUSCULOSKELETAL:  No edema; No deformity  SKIN: Warm and dry NEUROLOGIC:  Alert and oriented x 3 PSYCHIATRIC:  Normal affect   ASSESSMENT:    1. Atherosclerosis of native coronary artery of native heart without angina pectoris   2. Essential hypertension, benign   3. Pure hypercholesterolemia   4. Carotid stenosis, symptomatic w/o infarct, right    PLAN:    In order of problems listed above:  1.  ASCAD - multivessel ASCAD s/p CABG and s/p PCI of SVG to RI 2008.  He denies any anginal symptoms.  He will continue on Plavix 573m daily, BB and statin.    2.  HTN - BP well controlled on exam today.  He will continue on Lisinopril 542mdaily and Toprol XL 12.73m12maily.    3.  Hyperlipidemia - LDL goal < 70.  He will continue on atorvastatin 3m72mily.  His LDL was 66 in May 2018.  4.  Carotid artery stenosis s/p right CEA 06/2016.  He will continue on Plavix and statin.     Medication Adjustments/Labs and Tests Ordered: Current medicines are reviewed at length with the patient today.  Concerns regarding medicines are outlined above.  No orders of the defined types were placed in this encounter.  No orders of the defined types were placed in this encounter.   Signed, TracFransico Him  12/08/2016 9:02 AM    [place]

## 2017-01-04 DIAGNOSIS — H524 Presbyopia: Secondary | ICD-10-CM | POA: Diagnosis not present

## 2017-01-04 DIAGNOSIS — H25813 Combined forms of age-related cataract, bilateral: Secondary | ICD-10-CM | POA: Diagnosis not present

## 2017-01-04 DIAGNOSIS — H11001 Unspecified pterygium of right eye: Secondary | ICD-10-CM | POA: Diagnosis not present

## 2017-01-04 DIAGNOSIS — H353121 Nonexudative age-related macular degeneration, left eye, early dry stage: Secondary | ICD-10-CM | POA: Diagnosis not present

## 2017-01-07 ENCOUNTER — Other Ambulatory Visit (INDEPENDENT_AMBULATORY_CARE_PROVIDER_SITE_OTHER): Payer: Self-pay | Admitting: Vascular Surgery

## 2017-03-05 ENCOUNTER — Other Ambulatory Visit: Payer: Self-pay | Admitting: Cardiology

## 2017-03-11 DIAGNOSIS — I2510 Atherosclerotic heart disease of native coronary artery without angina pectoris: Secondary | ICD-10-CM | POA: Diagnosis not present

## 2017-03-11 DIAGNOSIS — Z9889 Other specified postprocedural states: Secondary | ICD-10-CM | POA: Diagnosis not present

## 2017-03-11 DIAGNOSIS — R69 Illness, unspecified: Secondary | ICD-10-CM | POA: Diagnosis not present

## 2017-03-11 DIAGNOSIS — Z6831 Body mass index (BMI) 31.0-31.9, adult: Secondary | ICD-10-CM | POA: Diagnosis not present

## 2017-04-17 ENCOUNTER — Other Ambulatory Visit: Payer: Self-pay | Admitting: Cardiology

## 2017-04-20 DIAGNOSIS — I25119 Atherosclerotic heart disease of native coronary artery with unspecified angina pectoris: Secondary | ICD-10-CM | POA: Diagnosis not present

## 2017-04-20 DIAGNOSIS — Z7902 Long term (current) use of antithrombotics/antiplatelets: Secondary | ICD-10-CM | POA: Diagnosis not present

## 2017-04-20 DIAGNOSIS — Z79891 Long term (current) use of opiate analgesic: Secondary | ICD-10-CM | POA: Diagnosis not present

## 2017-04-20 DIAGNOSIS — I739 Peripheral vascular disease, unspecified: Secondary | ICD-10-CM | POA: Diagnosis not present

## 2017-04-20 DIAGNOSIS — R69 Illness, unspecified: Secondary | ICD-10-CM | POA: Diagnosis not present

## 2017-04-20 DIAGNOSIS — I10 Essential (primary) hypertension: Secondary | ICD-10-CM | POA: Diagnosis not present

## 2017-04-20 DIAGNOSIS — I4891 Unspecified atrial fibrillation: Secondary | ICD-10-CM | POA: Diagnosis not present

## 2017-04-20 DIAGNOSIS — R52 Pain, unspecified: Secondary | ICD-10-CM | POA: Diagnosis not present

## 2017-04-20 DIAGNOSIS — Z8673 Personal history of transient ischemic attack (TIA), and cerebral infarction without residual deficits: Secondary | ICD-10-CM | POA: Diagnosis not present

## 2017-04-20 DIAGNOSIS — R32 Unspecified urinary incontinence: Secondary | ICD-10-CM | POA: Diagnosis not present

## 2017-07-07 DIAGNOSIS — H11001 Unspecified pterygium of right eye: Secondary | ICD-10-CM | POA: Diagnosis not present

## 2017-07-07 DIAGNOSIS — H52203 Unspecified astigmatism, bilateral: Secondary | ICD-10-CM | POA: Diagnosis not present

## 2017-07-07 DIAGNOSIS — D485 Neoplasm of uncertain behavior of skin: Secondary | ICD-10-CM | POA: Diagnosis not present

## 2017-07-07 DIAGNOSIS — H25813 Combined forms of age-related cataract, bilateral: Secondary | ICD-10-CM | POA: Diagnosis not present

## 2017-07-27 DIAGNOSIS — H268 Other specified cataract: Secondary | ICD-10-CM | POA: Diagnosis not present

## 2017-07-27 DIAGNOSIS — H21561 Pupillary abnormality, right eye: Secondary | ICD-10-CM | POA: Diagnosis not present

## 2017-07-27 DIAGNOSIS — H25811 Combined forms of age-related cataract, right eye: Secondary | ICD-10-CM | POA: Diagnosis not present

## 2017-08-02 ENCOUNTER — Other Ambulatory Visit (INDEPENDENT_AMBULATORY_CARE_PROVIDER_SITE_OTHER): Payer: Self-pay | Admitting: Vascular Surgery

## 2017-08-10 DIAGNOSIS — H25812 Combined forms of age-related cataract, left eye: Secondary | ICD-10-CM | POA: Diagnosis not present

## 2017-08-10 DIAGNOSIS — H21562 Pupillary abnormality, left eye: Secondary | ICD-10-CM | POA: Diagnosis not present

## 2017-08-10 DIAGNOSIS — H268 Other specified cataract: Secondary | ICD-10-CM | POA: Diagnosis not present

## 2017-09-09 DIAGNOSIS — I2510 Atherosclerotic heart disease of native coronary artery without angina pectoris: Secondary | ICD-10-CM | POA: Diagnosis not present

## 2017-09-09 DIAGNOSIS — R69 Illness, unspecified: Secondary | ICD-10-CM | POA: Diagnosis not present

## 2017-09-09 DIAGNOSIS — Z6831 Body mass index (BMI) 31.0-31.9, adult: Secondary | ICD-10-CM | POA: Diagnosis not present

## 2017-09-09 DIAGNOSIS — Z9889 Other specified postprocedural states: Secondary | ICD-10-CM | POA: Diagnosis not present

## 2017-09-09 DIAGNOSIS — Z23 Encounter for immunization: Secondary | ICD-10-CM | POA: Diagnosis not present

## 2017-09-09 DIAGNOSIS — Z9181 History of falling: Secondary | ICD-10-CM | POA: Diagnosis not present

## 2017-09-09 DIAGNOSIS — Z1331 Encounter for screening for depression: Secondary | ICD-10-CM | POA: Diagnosis not present

## 2017-09-25 ENCOUNTER — Emergency Department
Admission: EM | Admit: 2017-09-25 | Discharge: 2017-09-25 | Disposition: A | Discharge status: Home / Self Care | Payer: Medicare HMO | Attending: Emergency Medicine | Admitting: Emergency Medicine

## 2017-09-25 ENCOUNTER — Other Ambulatory Visit: Payer: Self-pay

## 2017-09-25 ENCOUNTER — Emergency Department: Payer: Medicare HMO

## 2017-09-25 DIAGNOSIS — T675XXA Heat exhaustion, unspecified, initial encounter: Secondary | ICD-10-CM | POA: Insufficient documentation

## 2017-09-25 DIAGNOSIS — I10 Essential (primary) hypertension: Secondary | ICD-10-CM | POA: Diagnosis not present

## 2017-09-25 DIAGNOSIS — R531 Weakness: Secondary | ICD-10-CM | POA: Diagnosis not present

## 2017-09-25 DIAGNOSIS — R110 Nausea: Secondary | ICD-10-CM | POA: Diagnosis not present

## 2017-09-25 DIAGNOSIS — I2510 Atherosclerotic heart disease of native coronary artery without angina pectoris: Secondary | ICD-10-CM | POA: Diagnosis not present

## 2017-09-25 DIAGNOSIS — Z951 Presence of aortocoronary bypass graft: Secondary | ICD-10-CM | POA: Insufficient documentation

## 2017-09-25 DIAGNOSIS — R0600 Dyspnea, unspecified: Secondary | ICD-10-CM | POA: Diagnosis not present

## 2017-09-25 DIAGNOSIS — R0902 Hypoxemia: Secondary | ICD-10-CM | POA: Diagnosis not present

## 2017-09-25 DIAGNOSIS — T673XXA Heat exhaustion, anhydrotic, initial encounter: Secondary | ICD-10-CM | POA: Diagnosis not present

## 2017-09-25 DIAGNOSIS — R5383 Other fatigue: Secondary | ICD-10-CM | POA: Diagnosis not present

## 2017-09-25 LAB — BASIC METABOLIC PANEL
Anion gap: 5 (ref 5–15)
BUN: 15 mg/dL (ref 8–23)
CALCIUM: 7.8 mg/dL — AB (ref 8.9–10.3)
CO2: 24 mmol/L (ref 22–32)
CREATININE: 1.17 mg/dL (ref 0.61–1.24)
Chloride: 111 mmol/L (ref 98–111)
GFR calc Af Amer: >60 mL/min (ref >60)
GLUCOSE: 110 mg/dL — AB (ref 70–99)
Potassium: 3.5 mmol/L (ref 3.5–5.1)
Sodium: 140 mmol/L (ref 135–145)

## 2017-09-25 LAB — HEPATIC FUNCTION PANEL
ALBUMIN: 3.3 g/dL — AB (ref 3.5–5.0)
ALK PHOS: 65 U/L (ref 38–126)
ALT: 13 U/L (ref 0–44)
AST: 20 U/L (ref 15–41)
Bilirubin, Direct: 0.1 mg/dL (ref 0.0–0.2)
Indirect Bilirubin: 0.6 mg/dL (ref 0.3–0.9)
TOTAL PROTEIN: 5.8 g/dL — AB (ref 6.5–8.1)
Total Bilirubin: 0.7 mg/dL (ref 0.3–1.2)

## 2017-09-25 LAB — CBC
HCT: 33.1 % — ABNORMAL LOW (ref 40.0–52.0)
HEMOGLOBIN: 11.5 g/dL — AB (ref 13.0–18.0)
MCH: 31.3 pg (ref 26.0–34.0)
MCHC: 34.7 g/dL (ref 32.0–36.0)
MCV: 90.1 fL (ref 80.0–100.0)
PLATELETS: 167 10*3/uL (ref 150–440)
RBC: 3.67 MIL/uL — ABNORMAL LOW (ref 4.40–5.90)
RDW: 13.4 % (ref 11.5–14.5)
WBC: 9.5 10*3/uL (ref 3.8–10.6)

## 2017-09-25 LAB — URINALYSIS, COMPLETE (UACMP) WITH MICROSCOPIC
Bacteria, UA: NONE SEEN
Bilirubin Urine: NEGATIVE
Glucose, UA: NEGATIVE mg/dL
Hgb urine dipstick: NEGATIVE
KETONES UR: NEGATIVE mg/dL
Leukocytes, UA: NEGATIVE
Nitrite: NEGATIVE
PH: 6 (ref 5.0–8.0)
Protein, ur: NEGATIVE mg/dL
SQUAMOUS EPITHELIAL / LPF: NONE SEEN (ref 0–5)
Specific Gravity, Urine: 1.014 (ref 1.005–1.030)

## 2017-09-25 LAB — CK: Total CK: 393 U/L (ref 49–397)

## 2017-09-25 LAB — TROPONIN I

## 2017-09-25 MED ORDER — SODIUM CHLORIDE 0.9 % IV SOLN
1000.0000 mL | Freq: Once | INTRAVENOUS | Status: AC
  Administered 2017-09-25: 1000 mL via INTRAVENOUS

## 2017-09-25 MED ORDER — SODIUM CHLORIDE 0.9 % IV SOLN: Freq: Once | INTRAVENOUS | Status: DC

## 2017-09-25 NOTE — ED Notes (Signed)
Patient assisted to stand beside bed to use urinal. Patient reports some dizziness with standing. Patient with 475 ml urine output.

## 2017-09-25 NOTE — ED Notes (Signed)
Patient to xray via stretcher

## 2017-09-25 NOTE — ED Triage Notes (Signed)
Pt was working outside all day. Reports fatigue and weakness x30 min. Alert and oriented x4. EMS gave 4 zofran and 2500 NS.

## 2017-09-25 NOTE — ED Notes (Signed)
Family at bedside. 

## 2017-09-25 NOTE — ED Provider Notes (Signed)
[facility] Emergency Department Provider Note       Time seen: ----------------------------------------- 6:50 PM on 09/25/2017 -----------------------------------------   I have reviewed the triage vital signs and the nursing notes.  HISTORY   Chief Complaint Fatigue    HPI Jacob [name] is a 67 y.o. male with a history of coronary artery disease, carotid artery stenosis, hyperlipidemia, dyspnea, hypertension, MI, TIA who presents to the ED for fatigue and weakness for about 30 minutes.  Patient arrives alert and oriented but states he was working hard most of the day.  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  EMS gave him a couple liters of saline and Zofran.  Family states he was soaking wet and diaphoretic and felt near syncopal.  Past Medical History:  Diagnosis Date  . CAD (coronary artery disease), native coronary artery     multivessel ASCAD s/p CABG and s/p PCI of SVG to RI 2008  . Carotid artery stenosis    95% right and 50% left s/p right CEA 06/2016  . Dyslipidemia   . Dyspnea   . ED (erectile dysfunction)   . History of blood transfusion 2005  . Hypertension   . Myocardial infarction ([place]) 2005  . Nephrolithiasis   . TIA (transient ischemic attack)   . Ulcerative colitis ([place])    Left sided    Patient Active Problem List   Diagnosis Date Noted  . Carotid artery stenosis   . Carotid stenosis, symptomatic w/o infarct, right 06/24/2016  . Confusion   . TIA (transient ischemic attack) 06/16/2016  . Coronary atherosclerosis of native coronary artery 09/14/2013  . Pure hypercholesterolemia 09/14/2013  . Essential hypertension, benign 09/14/2013  . Bradycardia, sinus 09/14/2013    Past Surgical History:  Procedure Laterality Date  . CARDIAC CATHETERIZATION  06/08   Patnet grafts LAD, marginal, distal right  . carpel tunnel surgery Left   . COLONOSCOPY WITH PROPOFOL N/A 10/25/2012   Procedure:  COLONOSCOPY WITH PROPOFOL;  Surgeon: Garlan Fair, MD;  Location: [facility];  Service: Endoscopy;  Laterality: N/A;  . CORONARY ARTERY BYPASS GRAFT  2005   ASCVD,multivessel,S/P CABG  . ENDARTERECTOMY Right 06/24/2016   Procedure: ENDARTERECTOMY CAROTID;  Surgeon: Algernon Huxley, MD;  Location: [facility] ORS;  Service: Vascular;  Laterality: Right;  . HERNIA REPAIR  many years ago  . KNEE ARTHROSCOPY Right 1995    Allergies Patient has no known allergies.  Social History Social History   Tobacco Use  . Smoking status: Never Smoker  . Smokeless tobacco: Current User  Substance Use Topics  . Alcohol use: No  . Drug use: No   Review of Systems Constitutional: Negative for fever. Cardiovascular: Negative for chest pain. Respiratory: Negative for shortness of breath. Gastrointestinal: Negative for abdominal pain, vomiting and diarrhea. Musculoskeletal: Negative for back pain. Skin: Positive for diaphoresis Neurological: Positive for weakness  All systems negative/normal/unremarkable except as stated in the HPI  ____________________________________________   PHYSICAL EXAM:  VITAL SIGNS: ED Triage Vitals [09/25/17 1830]  Enc Vitals Group     BP 127/83     Pulse Rate 75     Resp 15     Temp 98.4 F (36.9 C)     Temp src      SpO2 96 %     Weight 225 lb (102.1 kg)     Height 6' (1.829 m)     Head Circumference      Peak Flow  Pain Score 0     Pain Loc      Pain Edu?      Excl. in [place]?    Constitutional: Alert and oriented. Well appearing and in no distress. Eyes: Conjunctivae are normal. Normal extraocular movements. ENT   Head: Normocephalic and atraumatic.   Nose: No congestion/rhinnorhea.   Mouth/Throat: Mucous membranes are moist.   Neck: No stridor. Cardiovascular: Normal rate, regular rhythm. No murmurs, rubs, or gallops. Respiratory: Normal respiratory effort without tachypnea nor retractions. Breath sounds are clear and equal bilaterally.  No wheezes/rales/rhonchi. Gastrointestinal: Soft and nontender. Normal bowel sounds Musculoskeletal: Nontender with normal range of motion in extremities. No lower extremity tenderness nor edema. Neurologic:  Normal speech and language. No gross focal neurologic deficits are appreciated.  Skin:  Skin is warm, dry and intact. No rash noted. Psychiatric: Mood and affect are normal. Speech and behavior are normal.  ____________________________________________  EKG: Interpreted by me.  Sinus rhythm rate of 76 bpm, normal PR interval, normal QRS, normal QT  ____________________________________________  ED COURSE:  As part of my medical decision making, I reviewed the following data within the [place] History obtained from family if available, nursing notes, old chart and ekg, as well as notes from prior ED visits. Patient presented for fatigue and weakness with possible heat exhaustion, we will assess with labs and imaging as indicated at this time.   Procedures ____________________________________________   LABS (pertinent positives/negatives)  Labs Reviewed  BASIC METABOLIC PANEL - Abnormal; Notable for the following components:      Result Value   Glucose, Bld 110 (*)    Calcium 7.8 (*)    All other components within normal limits  CBC - Abnormal; Notable for the following components:   RBC 3.67 (*)    Hemoglobin 11.5 (*)    HCT 33.1 (*)    All other components within normal limits  URINALYSIS, COMPLETE (UACMP) WITH MICROSCOPIC - Abnormal; Notable for the following components:   Color, Urine YELLOW (*)    APPearance CLEAR (*)    All other components within normal limits  HEPATIC FUNCTION PANEL - Abnormal; Notable for the following components:   Total Protein 5.8 (*)    Albumin 3.3 (*)    All other components within normal limits  TROPONIN I  CK    RADIOLOGY Images were viewed by me  Chest x-ray IMPRESSION: No active cardiopulmonary  disease. ____________________________________________  DIFFERENTIAL DIAGNOSIS   Heat exhaustion, heat stroke, dehydration, electrolyte abnormality, rhabdomyolysis, MI, arrhythmia  FINAL ASSESSMENT AND PLAN  Heat exhaustion  Plan: The patient had presented for weakness. Patient's labs do not reveal any acute process. Patient's imaging was negative.  Patient certainly had heat exhaustion and felt much better after IV fluids here.  He has no neurologic symptoms.  He is cleared for outpatient follow-up.   Laurence Aly, MD   Note: This note was generated in part or whole with voice recognition software. Voice recognition is usually quite accurate but there are transcription errors that can and very often do occur. I apologize for any typographical errors that were not detected and corrected.     Earleen Newport, MD 09/25/17 2108

## 2017-09-25 NOTE — ED Notes (Signed)
Patient assisted to bathroom. Patient states that he is not as dizzy this time and able to stand by himself.

## 2017-11-12 ENCOUNTER — Other Ambulatory Visit: Payer: Self-pay | Admitting: Cardiology

## 2017-11-25 ENCOUNTER — Other Ambulatory Visit: Payer: Self-pay | Admitting: Nurse Practitioner

## 2017-11-25 ENCOUNTER — Ambulatory Visit
Admission: RE | Admit: 2017-11-25 | Discharge: 2017-11-25 | Disposition: A | Discharge status: Home / Self Care | Payer: Medicare HMO | Source: Ambulatory Visit | Attending: Nurse Practitioner | Admitting: Nurse Practitioner

## 2017-11-25 ENCOUNTER — Other Ambulatory Visit: Payer: Medicare HMO

## 2017-11-25 DIAGNOSIS — N5089 Other specified disorders of the male genital organs: Secondary | ICD-10-CM | POA: Insufficient documentation

## 2017-11-25 DIAGNOSIS — N50812 Left testicular pain: Secondary | ICD-10-CM | POA: Diagnosis not present

## 2017-11-25 DIAGNOSIS — N50819 Testicular pain, unspecified: Secondary | ICD-10-CM

## 2017-11-25 DIAGNOSIS — I861 Scrotal varices: Secondary | ICD-10-CM | POA: Insufficient documentation

## 2017-11-25 DIAGNOSIS — Z6831 Body mass index (BMI) 31.0-31.9, adult: Secondary | ICD-10-CM | POA: Diagnosis not present

## 2017-12-07 ENCOUNTER — Encounter: Payer: Self-pay | Admitting: Urology

## 2017-12-07 ENCOUNTER — Ambulatory Visit: Payer: Medicare HMO | Admitting: Urology

## 2017-12-07 VITALS — BP 102/62 | HR 73 | Ht 71.0 in | Wt 215.0 lb

## 2017-12-07 DIAGNOSIS — R1032 Left lower quadrant pain: Secondary | ICD-10-CM

## 2017-12-07 DIAGNOSIS — N5082 Scrotal pain: Secondary | ICD-10-CM

## 2017-12-07 DIAGNOSIS — N50819 Testicular pain, unspecified: Secondary | ICD-10-CM | POA: Diagnosis not present

## 2017-12-07 LAB — MICROSCOPIC EXAMINATION
EPITHELIAL CELLS (NON RENAL): NONE SEEN /HPF (ref 0–10)
WBC UA: NONE SEEN /HPF (ref 0–5)

## 2017-12-07 LAB — URINALYSIS, COMPLETE
Bilirubin, UA: NEGATIVE
GLUCOSE, UA: NEGATIVE
KETONES UA: NEGATIVE
Leukocytes, UA: NEGATIVE
NITRITE UA: NEGATIVE
Protein, UA: NEGATIVE
RBC UA: NEGATIVE
SPEC GRAV UA: 1.01 (ref 1.005–1.030)
UUROB: 0.2 mg/dL (ref 0.2–1.0)
pH, UA: 6.5 (ref 5.0–7.5)

## 2017-12-07 MED ORDER — AMITRIPTYLINE HCL 25 MG PO TABS
25.0000 mg | ORAL_TABLET | Freq: Every day | ORAL | 1 refills | Status: DC
Start: 2017-12-07 — End: 2018-03-02

## 2017-12-07 NOTE — Progress Notes (Signed)
12/07/2017 2:12 PM   Jacob [name] 1951-01-15 [id number]  Referring provider: Philmore Pali, NP [address] [place], [place] [address]  Chief Complaint  Patient presents with  . Testicle Pain    New patient    HPI: 67 year old male seen in consultation at the request of Charlott Holler for evaluation of left hemiscrotal pain.  He presents with a 2 to 3-week history of relative acute onset of left hemiscrotal pain.  He was unable to identify any precipitating factor.  His pain is worse when getting out of the car.  There is radiation into the left groin/left lower quadrant region.  He had a prior left inguinal hernia repair around early 2000 without sequela.  He denies back pain or sciatica symptoms.  For the past 4 to 6 months he has noted increased urinary frequency, urgency and nocturia x1-2.  He had a scrotal sonogram performed on 11/25/2017.  The testes were sonographically normal without mass.  Epididymes or sonographically normal.  A left varicocele was present.   PMH: Past Medical History:  Diagnosis Date  . CAD (coronary artery disease), native coronary artery     multivessel ASCAD s/p CABG and s/p PCI of SVG to RI 2008  . Carotid artery stenosis    95% right and 50% left s/p right CEA 06/2016  . Dyslipidemia   . Dyspnea   . ED (erectile dysfunction)   . History of blood transfusion 2005  . Hypertension   . Myocardial infarction ([place]) 2005  . Nephrolithiasis   . TIA (transient ischemic attack)   . Ulcerative colitis ([place])    Left sided    Surgical History: Past Surgical History:  Procedure Laterality Date  . CARDIAC CATHETERIZATION  06/08   Patnet grafts LAD, marginal, distal right  . carpel tunnel surgery Left   . COLONOSCOPY WITH PROPOFOL N/A 10/25/2012   Procedure: COLONOSCOPY WITH PROPOFOL;  Surgeon: Garlan Fair, MD;  Location: [facility];  Service: Endoscopy;  Laterality: N/A;  . CORONARY ARTERY BYPASS GRAFT  2005   ASCVD,multivessel,S/P CABG  .  ENDARTERECTOMY Right 06/24/2016   Procedure: ENDARTERECTOMY CAROTID;  Surgeon: Algernon Huxley, MD;  Location: [facility];  Service: Vascular;  Laterality: Right;  . HERNIA REPAIR  many years ago  . KNEE ARTHROSCOPY Right 1995    Home Medications:  Allergies as of 12/07/2017   No Known Allergies     Medication List        Accurate as of 12/07/17  2:12 PM. Always use your most recent med list.          atorvastatin 40 MG tablet Commonly known as:  LIPITOR TAKE ONE TABLET BY MOUTH ONCE DAILY   clopidogrel 75 MG tablet Commonly known as:  PLAVIX TAKE 1 TABLET BY MOUTH ONCE DAILY WITH BREAKFAST   escitalopram 10 MG tablet Commonly known as:  LEXAPRO Take 10 mg daily by mouth.   lisinopril 5 MG tablet Commonly known as:  PRINIVIL,ZESTRIL TAKE ONE TABLET BY MOUTH ONCE DAILY   metoprolol succinate 25 MG 24 hr tablet Commonly known as:  TOPROL-XL Take 0.5 tablets (12.5 mg total) by mouth daily. Please make annual appt for future refills. [phone]. 1st attempt.   oxyCODONE-acetaminophen 5-325 MG tablet Commonly known as:  PERCOCET/ROXICET Take 1-2 tablets by mouth every 4 (four) hours as needed for moderate pain.       Allergies: No Known Allergies  Family History: Family History  Problem Relation Age of Onset  . Ovarian  cancer Mother   . CAD Father   . CAD Sister   . CAD Brother   . CAD Brother   . CAD Brother     Social History:  reports that he has never smoked. He uses smokeless tobacco. He reports that he does not drink alcohol or use drugs.  ROS: UROLOGY Frequent Urination?: Yes Hard to postpone urination?: Yes Burning/pain with urination?: No Get up at night to urinate?: Yes Leakage of urine?: No Urine stream starts and stops?: No Trouble starting stream?: No Do you have to strain to urinate?: No Blood in urine?: No Urinary tract infection?: No Sexually transmitted disease?: No Injury to kidneys or bladder?: No Painful intercourse?: No Weak  stream?: No Erection problems?: Yes Penile pain?: No  Gastrointestinal Nausea?: No Vomiting?: No Indigestion/heartburn?: No Diarrhea?: No Constipation?: No  Constitutional Fever: No Night sweats?: No Weight loss?: No Fatigue?: No  Skin Skin rash/lesions?: No Itching?: No  Eyes Blurred vision?: No Double vision?: No  Ears/Nose/Throat Sore throat?: No Sinus problems?: No  Hematologic/Lymphatic Swollen glands?: No Easy bruising?: No  Cardiovascular Leg swelling?: No Chest pain?: No  Respiratory Cough?: No Shortness of breath?: No  Endocrine Excessive thirst?: No  Musculoskeletal Back pain?: No Joint pain?: No  Neurological Headaches?: No Dizziness?: No  Psychologic Depression?: No Anxiety?: No  Physical Exam: BP 102/62   Pulse 73   Ht 5' 11"  (1.803 m)   Wt 215 lb (97.5 kg)   BMI 29.99 kg/m   Constitutional:  Alert and oriented, No acute distress. HEENT: [place] AT, moist mucus membranes.  Trachea midline, no masses. Cardiovascular: No clubbing, cyanosis, or edema. Respiratory: Normal respiratory effort, no increased work of breathing. GI: Abdomen is soft, nontender, nondistended, no abdominal masses GU: No CVA tenderness.  Phallus circumcised without lesions.  Testes descended bilaterally.  The left testis is soft and slightly atrophic.  Diffuse tenderness present.  There is a moderate left varicocele.  Right testis palpably normal. Skin: No rashes, bruises or suspicious lesions. Neurologic: Grossly intact, no focal deficits, moving all 4 extremities. Psychiatric: Normal mood and affect.  Laboratory Data:  Urinalysis Dipstick/microscopy negative  Pertinent Imaging: Scrotal sonogram was personally reviewed  Assessment & Plan:   67 year old male with acute left scrotal content pain.  The left testis is soft and slightly atrophic.  A left varicocele is present however his pain is not typical for varicocele associated pain.  We will give a trial  of amitriptyline 25 mg nightly.  Follow-up 4-6 weeks for reassessment.  Will perform diagnostic cord block on follow-up if he has persistent pain.  Will schedule an abdominal ultrasound to rule out retroperitoneal pathology since his varicocele may be of recent onset.   Abbie Sons, [place] [address] [place], [place] [address] [phone]

## 2017-12-13 DIAGNOSIS — E669 Obesity, unspecified: Secondary | ICD-10-CM | POA: Diagnosis not present

## 2017-12-13 DIAGNOSIS — Z0000 Encounter for general adult medical examination without abnormal findings: Secondary | ICD-10-CM | POA: Diagnosis not present

## 2017-12-13 DIAGNOSIS — Z6831 Body mass index (BMI) 31.0-31.9, adult: Secondary | ICD-10-CM | POA: Diagnosis not present

## 2017-12-13 DIAGNOSIS — Z1339 Encounter for screening examination for other mental health and behavioral disorders: Secondary | ICD-10-CM | POA: Diagnosis not present

## 2017-12-13 DIAGNOSIS — Z1211 Encounter for screening for malignant neoplasm of colon: Secondary | ICD-10-CM | POA: Diagnosis not present

## 2017-12-13 DIAGNOSIS — E785 Hyperlipidemia, unspecified: Secondary | ICD-10-CM | POA: Diagnosis not present

## 2017-12-13 DIAGNOSIS — Z23 Encounter for immunization: Secondary | ICD-10-CM | POA: Diagnosis not present

## 2017-12-13 DIAGNOSIS — Z136 Encounter for screening for cardiovascular disorders: Secondary | ICD-10-CM | POA: Diagnosis not present

## 2017-12-14 ENCOUNTER — Other Ambulatory Visit: Payer: Self-pay | Admitting: Cardiology

## 2017-12-20 ENCOUNTER — Encounter: Payer: Self-pay | Admitting: Cardiology

## 2017-12-20 ENCOUNTER — Ambulatory Visit: Payer: Medicare HMO | Admitting: Cardiology

## 2017-12-20 ENCOUNTER — Encounter

## 2017-12-20 VITALS — BP 109/75 | HR 65 | Ht 71.0 in | Wt 229.0 lb

## 2017-12-20 DIAGNOSIS — E7800 Pure hypercholesterolemia, unspecified: Secondary | ICD-10-CM

## 2017-12-20 DIAGNOSIS — I10 Essential (primary) hypertension: Secondary | ICD-10-CM | POA: Diagnosis not present

## 2017-12-20 DIAGNOSIS — I2510 Atherosclerotic heart disease of native coronary artery without angina pectoris: Secondary | ICD-10-CM

## 2017-12-20 DIAGNOSIS — I6523 Occlusion and stenosis of bilateral carotid arteries: Secondary | ICD-10-CM

## 2017-12-20 DIAGNOSIS — R001 Bradycardia, unspecified: Secondary | ICD-10-CM

## 2017-12-20 NOTE — Patient Instructions (Signed)
Medication Instructions:  Your physician recommends that you continue on your current medications as directed. Please refer to the Current Medication list given to you today.  If you need a refill on your cardiac medications before your next appointment, please call your pharmacy.   Lab work:  If you have labs (blood work) drawn today and your tests are completely normal, you will receive your results only by: Marland Kitchen MyChart Message (if you have MyChart) OR . A paper copy in the mail If you have any lab test that is abnormal or we need to change your treatment, we will call you to review the results.  Testing/Procedures: Your physician has requested that you have a carotid duplex. This test is an ultrasound of the carotid arteries in your neck. It looks at blood flow through these arteries that supply the brain with blood. Allow one hour for this exam. There are no restrictions or special instructions.  Follow-Up: At [facility], you and your health needs are our priority.  As part of our continuing mission to provide you with exceptional heart care, we have created designated Provider Care Teams.  These Care Teams include your primary Cardiologist (physician) and Advanced Practice Providers (APPs -  Physician Assistants and Nurse Practitioners) who all work together to provide you with the care you need, when you need it. You will need a follow up appointment in 1 years.  Please call our office 2 months in advance to schedule this appointment.  You may see Fransico Him, MD or one of the following Advanced Practice Providers on your designated Care Team:   Anna, PA-C Melina Copa, PA-C . Ermalinda Barrios, PA-C  Get PCP to fax fasting lipid labs to our office at [phone].

## 2017-12-20 NOTE — Progress Notes (Signed)
Cardiology Office Note:    Date:  12/20/2017   ID:  Jacob [name], DOB January 19, 1951, MRN [id number]  PCP:  Philmore Pali, NP  Cardiologist:  Fransico Him, MD    Referring MD: Philmore Pali, NP   Chief Complaint  Patient presents with  . Coronary Artery Disease  . Hypertension  . Hyperlipidemia    History of Present Illness:    Jacob [name] is a 67 y.o. male with a hx of multivessel ASCAD s/p CABGands/p PCI of SVG to RI 2008, dyslipidemia and HTN.  He is here today for followup and is doing well.  He denies any anginal chest pain or pressure, PND, orthopnea, LE edema, dizziness, palpitations or syncope.  He does occasionally notice a pinpricking sensation in his left breast but this is nothing like what his anginal chest pain was in the past.  He occasionally has dyspnea on exertion with walking up hills but this is not changed since I saw him last.  He is compliant with his meds and is tolerating meds with no SE.    Past Medical History:  Diagnosis Date  . CAD (coronary artery disease), native coronary artery     multivessel ASCAD s/p CABG and s/p PCI of SVG to RI 2008  . Carotid artery stenosis    95% right and 50% left s/p right CEA 06/2016  . Dyslipidemia   . Dyspnea   . ED (erectile dysfunction)   . History of blood transfusion 2005  . Hypertension   . Myocardial infarction ([place]) 2005  . Nephrolithiasis   . TIA (transient ischemic attack)   . Ulcerative colitis ([place])    Left sided    Past Surgical History:  Procedure Laterality Date  . CARDIAC CATHETERIZATION  06/08   Patnet grafts LAD, marginal, distal right  . carpel tunnel surgery Left   . COLONOSCOPY WITH PROPOFOL N/A 10/25/2012   Procedure: COLONOSCOPY WITH PROPOFOL;  Surgeon: Garlan Fair, MD;  [facility];  Service: Endoscopy;  Laterality: N/A;  . CORONARY ARTERY BYPASS GRAFT  2005   ASCVD,multivessel,S/P CABG  . ENDARTERECTOMY Right 06/24/2016   Procedure: ENDARTERECTOMY CAROTID;  Surgeon:  Algernon Huxley, MD;  Location: [facility];  Service: Vascular;  Laterality: Right;  . HERNIA REPAIR  many years ago  . KNEE ARTHROSCOPY Right 1995    Current Medications: Current Meds  Medication Sig  . amitriptyline (ELAVIL) 25 MG tablet Take 1 tablet (25 mg total) by mouth at bedtime.  Marland Kitchen atorvastatin (LIPITOR) 40 MG tablet TAKE 1 TABLET BY MOUTH ONCE DAILY  . clopidogrel (PLAVIX) 75 MG tablet TAKE 1 TABLET BY MOUTH ONCE DAILY WITH BREAKFAST  . escitalopram (LEXAPRO) 10 MG tablet Take 10 mg daily by mouth.   Marland Kitchen lisinopril (PRINIVIL,ZESTRIL) 5 MG tablet TAKE 1 TABLET BY MOUTH ONCE DAILY  . metoprolol succinate (TOPROL-XL) 25 MG 24 hr tablet Take 0.5 tablets (12.5 mg total) by mouth daily. Please make annual appt for future refills. [phone]. 1st attempt.  Marland Kitchen oxyCODONE-acetaminophen (PERCOCET/ROXICET) 5-325 MG tablet Take 1-2 tablets by mouth every 4 (four) hours as needed for moderate pain.     Allergies:   Patient has no known allergies.   Social History   Socioeconomic History  . Marital status: Married    Spouse name: Not on file  . Number of children: Not on file  . Years of education: Not on file  . Highest education level: Not on file  Occupational History  . Not on  file  Social Needs  . Financial resource strain: Not on file  . Food insecurity:    Worry: Not on file    Inability: Not on file  . Transportation needs:    Medical: Not on file    Non-medical: Not on file  Tobacco Use  . Smoking status: Never Smoker  . Smokeless tobacco: Current User  Substance and Sexual Activity  . Alcohol use: No  . Drug use: No  . Sexual activity: Not on file  Lifestyle  . Physical activity:    Days per week: Not on file    Minutes per session: Not on file  . Stress: Not on file  Relationships  . Social connections:    Talks on phone: Not on file    Gets together: Not on file    Attends religious service: Not on file    Active member of club or organization: Not on file     Attends meetings of clubs or organizations: Not on file    Relationship status: Not on file  Other Topics Concern  . Not on file  Social History Narrative  . Not on file     Family History: The patient's family history includes CAD in his brother, brother, brother, father, and sister; Ovarian cancer in his mother.  ROS:   Please see the history of present illness.    ROS  All other systems reviewed and negative.   EKGs/Labs/Other Studies Reviewed:    The following studies were reviewed today: none  EKG:  EKG is not ordered today  Recent Labs: 09/25/2017: ALT 13; BUN 15; Creatinine, Ser 1.17; Hemoglobin 11.5; Platelets 167; Potassium 3.5; Sodium 140   Recent Lipid Panel    Component Value Date/Time   CHOL 127 06/17/2016 0523   TRIG 111 06/17/2016 0523   HDL 39 (L) 06/17/2016 0523   CHOLHDL 3.3 06/17/2016 0523   VLDL 22 06/17/2016 0523   LDLCALC 66 06/17/2016 0523   LDLDIRECT 71 01/24/2013 0914    Physical Exam:    VS:  BP 109/75   Pulse 65   Ht 5' 11"  (1.803 m)   Wt 229 lb (103.9 kg)   BMI 31.94 kg/m     Wt Readings from Last 3 Encounters:  12/20/17 229 lb (103.9 kg)  12/07/17 215 lb (97.5 kg)  09/25/17 225 lb (102.1 kg)     GEN:  Well nourished, well developed in no acute distress HEENT: Normal NECK: No JVD; No carotid bruits LYMPHATICS: No lymphadenopathy CARDIAC: RRR, no murmurs, rubs, gallops RESPIRATORY:  Clear to auscultation without rales, wheezing or rhonchi  ABDOMEN: Soft, non-tender, non-distended MUSCULOSKELETAL:  No edema; No deformity  SKIN: Warm and dry NEUROLOGIC:  Alert and oriented x 3 PSYCHIATRIC:  Normal affect   ASSESSMENT:    1. Atherosclerosis of native coronary artery of native heart without angina pectoris   2. Essential hypertension, benign   3. Bilateral carotid artery stenosis   4. Bradycardia, sinus   5. Pure hypercholesterolemia    PLAN:    In order of problems listed above:  1.  ASCAD - multivessel ASCAD s/p  CABGands/p PCI of SVG to RI 2008.  He denies any anginal symptoms.  He will continue on Plavix 75 mg daily, beta-blocker and statin.  2.  HTN -BP is well controlled on exam today.  He will continue on lisinopril 5 mg daily and Lopressor 12.5 mg daily.  I will get a copy of his last bmet from his PCP.  3.  Carotid artery stenosis -CT Angie of the neck 06/16/2016 showed severe near occlusive stenosis of the right ICA and 40 to 50% plaque in the proximal left ICA as well as atheromatous plaque in the left vertebral artery with moderate to severe stenosis.  Status post right CEA 06/2016.  I will repeat carotid Dopplers.  He will continue on statin and Plavix.  4.  Bradycardia -resolved and heart rate 65 bpm today.  5.  Hyperlipidemia -LDL goal is less than 70.   I will get a copy of his last LDL from his PCP he will continue atorvastatin 40 mg daily for now   Medication Adjustments/Labs and Tests Ordered: Current medicines are reviewed at length with the patient today.  Concerns regarding medicines are outlined above.  No orders of the defined types were placed in this encounter.  No orders of the defined types were placed in this encounter.   Signed, Fransico Him, MD  12/20/2017 10:29 AM    [place]

## 2017-12-21 ENCOUNTER — Encounter: Payer: Self-pay | Admitting: Cardiology

## 2017-12-21 ENCOUNTER — Ambulatory Visit (HOSPITAL_COMMUNITY)
Admission: RE | Admit: 2017-12-21 | Discharge: 2017-12-21 | Disposition: A | Discharge status: Home / Self Care | Payer: Medicare HMO | Source: Ambulatory Visit | Attending: Cardiovascular Disease | Admitting: Cardiovascular Disease

## 2017-12-21 DIAGNOSIS — I6523 Occlusion and stenosis of bilateral carotid arteries: Secondary | ICD-10-CM

## 2017-12-23 ENCOUNTER — Telehealth: Payer: Self-pay | Admitting: Cardiology

## 2017-12-23 NOTE — Telephone Encounter (Signed)
° °  Patient states he is returning call for results

## 2017-12-28 NOTE — Telephone Encounter (Signed)
Notes recorded by Sueanne Margarita, MD on 12/21/2017 at 6:23 PM EST Dopplers showed stable mild carotid artery stenosis 1-39%. ------  Notes recorded by Sueanne Margarita, MD on 12/21/2017 at 12:55 PM EST 1-39% bilateral carotid stenosis - repeat dopplers in 2 years

## 2017-12-28 NOTE — Telephone Encounter (Signed)
Spoke with the patient, he expressed understanding about carotid artery doppler results.

## 2017-12-31 DIAGNOSIS — J111 Influenza due to unidentified influenza virus with other respiratory manifestations: Secondary | ICD-10-CM | POA: Diagnosis not present

## 2017-12-31 DIAGNOSIS — C44310 Basal cell carcinoma of skin of unspecified parts of face: Secondary | ICD-10-CM | POA: Diagnosis not present

## 2017-12-31 DIAGNOSIS — Z6831 Body mass index (BMI) 31.0-31.9, adult: Secondary | ICD-10-CM | POA: Diagnosis not present

## 2018-01-11 ENCOUNTER — Other Ambulatory Visit: Payer: Self-pay | Admitting: Cardiology

## 2018-01-17 ENCOUNTER — Other Ambulatory Visit: Payer: Self-pay

## 2018-01-17 ENCOUNTER — Telehealth: Payer: Self-pay

## 2018-01-17 ENCOUNTER — Ambulatory Visit: Admission: RE | Admit: 2018-01-17 | Payer: Medicare HMO | Source: Ambulatory Visit

## 2018-01-17 DIAGNOSIS — N5082 Scrotal pain: Secondary | ICD-10-CM

## 2018-01-17 DIAGNOSIS — R1032 Left lower quadrant pain: Secondary | ICD-10-CM

## 2018-01-17 NOTE — Telephone Encounter (Signed)
Incoming call from [place] in [place] stating that the order currently placed for this patient is for the upper abdominal region however note states that we are ruling out lower left quad pain. Verbal order given to change u/s order to Pelvic limited.

## 2018-01-20 ENCOUNTER — Ambulatory Visit
Admission: RE | Admit: 2018-01-20 | Discharge: 2018-01-20 | Disposition: A | Discharge status: Home / Self Care | Payer: Medicare HMO | Source: Ambulatory Visit | Attending: Urology | Admitting: Urology

## 2018-01-20 DIAGNOSIS — M549 Dorsalgia, unspecified: Secondary | ICD-10-CM | POA: Diagnosis not present

## 2018-01-20 DIAGNOSIS — R1032 Left lower quadrant pain: Secondary | ICD-10-CM | POA: Diagnosis not present

## 2018-01-21 ENCOUNTER — Ambulatory Visit: Payer: Medicare HMO | Admitting: Urology

## 2018-01-21 ENCOUNTER — Encounter: Payer: Self-pay | Admitting: Urology

## 2018-01-21 VITALS — BP 99/60 | HR 74 | Ht 71.0 in | Wt 225.3 lb

## 2018-01-21 DIAGNOSIS — N5082 Scrotal pain: Secondary | ICD-10-CM | POA: Diagnosis not present

## 2018-01-21 DIAGNOSIS — R1030 Lower abdominal pain, unspecified: Secondary | ICD-10-CM

## 2018-01-21 NOTE — Progress Notes (Signed)
01/21/2018  11:24 AM   Jacob [name] July 13, 1950 [id number]  Referring provider: Philmore Pali, NP [address] [place], [place] [address]  No chief complaint on file.  Urologic history 1. Left scrotal pain  - s/p left varicocele  - Pain reduced with implementation of amitriptyline 30m nightly  2. History of nephrolithiasis    HPI: Jacob CHURCHILLis a 68y.o. male that presents today to discuss his RUS results.  - Pt admits that he feels the amitriptyline has been effective in alleviating his symptoms  - Limited pelvic ultrasound (01/20/2018) revealed no mass or pathologic lymphadenopathy in the visualized pelvis, as well as, no visible left inguinal hernial  PMH: Past Medical History:  Diagnosis Date  . CAD (coronary artery disease), native coronary artery     multivessel ASCAD s/p CABG and s/p PCI of SVG to RI 2008  . Carotid artery stenosis    95% right and 50% left s/p right CEA 06/2016 with dopplers 12/2017 with 1-39% stenosis  . Dyslipidemia   . Dyspnea   . ED (erectile dysfunction)   . History of blood transfusion 2005  . Hypertension   . Myocardial infarction ([place] 2005  . Nephrolithiasis   . TIA (transient ischemic attack)   . [place]cerative colitis ([place]    Left sided    Surgical History: Past Surgical History:  Procedure Laterality Date  . CARDIAC CATHETERIZATION  06/08   Patnet grafts LAD, marginal, distal right  . carpel tunnel surgery Left   . COLONOSCOPY WITH PROPOFOL N/A 10/25/2012   Procedure: COLONOSCOPY WITH PROPOFOL;  Surgeon: MGarlan Fair MD;  [facility];  Service: Endoscopy;  Laterality: N/A;  . CORONARY ARTERY BYPASS GRAFT  2005   ASCVD,multivessel,S/P CABG  . ENDARTERECTOMY Right 06/24/2016   Procedure: ENDARTERECTOMY CAROTID;  Surgeon: DAlgernon Huxley MD;  Location: [facility];  Service: Vascular;  Laterality: Right;  . HERNIA REPAIR  many years ago  . KNEE ARTHROSCOPY Right 1995    Home Medications:  Allergies as of 01/21/2018    No Known Allergies     Medication List       Accurate as of January 21, 2018 11:24 AM. Always use your most recent med list.        amitriptyline 25 MG tablet Commonly known as:  ELAVIL Take 1 tablet (25 mg total) by mouth at bedtime.   atorvastatin 40 MG tablet Commonly known as:  LIPITOR TAKE 1 TABLET BY MOUTH ONCE DAILY   clopidogrel 75 MG tablet Commonly known as:  PLAVIX TAKE 1 TABLET BY MOUTH ONCE DAILY WITH BREAKFAST   escitalopram 10 MG tablet Commonly known as:  LEXAPRO Take 10 mg daily by mouth.   lisinopril 5 MG tablet Commonly known as:  PRINIVIL,ZESTRIL TAKE 1 TABLET BY MOUTH ONCE DAILY   metoprolol succinate 25 MG 24 hr tablet Commonly known as:  TOPROL-XL TAKE 1/2 (ONE-HALF) TABLET BY MOUTH ONCE DAILY **MAKE  APPOINTMENT  WITH  MD  FOR  REFILLS**   oxyCODONE-acetaminophen 5-325 MG tablet Commonly known as:  PERCOCET/ROXICET Take 1-2 tablets by mouth every 4 (four) hours as needed for moderate pain.       Allergies: No Known Allergies  Family History: Family History  Problem Relation Age of Onset  . Ovarian cancer Mother   . CAD Father   . CAD Sister   . CAD Brother   . CAD Brother   . CAD Brother     Social History:  reports that  he has never smoked. He uses smokeless tobacco. He reports that he does not drink alcohol or use drugs.  ROS: UROLOGY Frequent Urination?: No Hard to postpone urination?: No Burning/pain with urination?: No Get up at night to urinate?: No Leakage of urine?: No Urine stream starts and stops?: No Trouble starting stream?: No Do you have to strain to urinate?: No Blood in urine?: No Urinary tract infection?: No Sexually transmitted disease?: No Injury to kidneys or bladder?: No Painful intercourse?: No Weak stream?: No Erection problems?: Yes Penile pain?: No  Gastrointestinal Nausea?: No Vomiting?: No Indigestion/heartburn?: No Diarrhea?: No Constipation?: Yes  Constitutional Fever: No Night  sweats?: No Weight loss?: No Fatigue?: No  Skin Skin rash/lesions?: No Itching?: No  Eyes Blurred vision?: No Double vision?: No  Ears/Nose/Throat Sore throat?: Yes Sinus problems?: No  Hematologic/Lymphatic Swollen glands?: No Easy bruising?: Yes  Cardiovascular Leg swelling?: No Chest pain?: No  Respiratory Cough?: Yes Shortness of breath?: No  Endocrine Excessive thirst?: No  Musculoskeletal Back pain?: Yes Joint pain?: Yes  Neurological Headaches?: Yes Dizziness?: No  Psychologic Depression?: No Anxiety?: No  Physical Exam: BP 99/60 (BP Location: Left Arm, Patient Position: Sitting, Cuff Size: Normal)   Pulse 74   Ht 5' 11"  (1.803 m)   Wt 225 lb 4.8 oz (102.2 kg)   BMI 31.42 kg/m   Constitutional:  Alert and oriented, No acute distress. HEENT: [place] AT, moist mucus membranes.  Trachea midline, no masses. Cardiovascular: No clubbing, cyanosis, or edema. Respiratory: Normal respiratory effort, no increased work of breathing. Skin: No rashes, bruises or suspicious lesions. Neurologic: Grossly intact, no focal deficits, moving all 4 extremities. Psychiatric: Normal mood and affect.  Laboratory Data: Lab Results  Component Value Date   WBC 9.5 09/25/2017   HGB 11.5 (L) 09/25/2017   HCT 33.1 (L) 09/25/2017   MCV 90.1 09/25/2017   PLT 167 09/25/2017   Lab Results  Component Value Date   CREATININE 1.17 09/25/2017    Pertinent Imaging: CLINICAL DATA:  68 year old with acute onset of LEFT scrotal pain in early November, 2019, shown on scrotal ultrasound at that time to have a LEFT varicocele. Pain is also present in [place] and LEFT inguinal region.  EXAM: LIMITED ULTRASOUND OF PELVIS  TECHNIQUE: Limited transabdominal ultrasound examination of the pelvis was performed in the area of clinical concern.  COMPARISON:  None.  FINDINGS: No mass or pathologic lymphadenopathy in the visualized pelvis. No visible LEFT inguinal  hernia.  IMPRESSION: Normal examination.   Electronically Signed   By: Evangeline Dakin M.D.   On: 01/20/2018 10:10  I have personally reviewed all pertinent images today.  Assessment & Plan:   1. left scrotal pain  - symptoms well managed with amitryptyline 25 mg nightly  - Limited pelvic ultrasound (01/20/2018) revealed no mass or pathologic lymphadenopathy in the visualized pelvis, as well as, no visible left inguinal hernial  - Reviewed US findings with patient  -Schedule abdominal CT to evaluate for obstruction/reasons for referred scrotal pain   Bernardo Heater, Ronda Fairly, MD  [place] [address], [place], [place] [address] [phone]   I, Temidayo Atanda[place] , am acting as a scribe for [employer], MD  I, Abbie Sons, MD, have reviewed all documentation for this visit. The documentation on 01/21/18 for the exam, diagnosis, procedures, and orders are all accurate and complete.

## 2018-02-01 ENCOUNTER — Telehealth: Payer: Self-pay | Admitting: Urology

## 2018-02-01 DIAGNOSIS — R109 Unspecified abdominal pain: Secondary | ICD-10-CM

## 2018-02-01 NOTE — Telephone Encounter (Signed)
Patient's insurance denied his CT scan said that they would approve a CT abd/pelvis w/o 74176 but for his symptoms it did not warrant a CT [address] if you are ok with changing the order just put a new one in and I will call them to give them the ok to change it.   Sharyn Lull

## 2018-02-02 NOTE — Telephone Encounter (Signed)
Order was entered

## 2018-02-11 ENCOUNTER — Ambulatory Visit
Admission: RE | Admit: 2018-02-11 | Discharge: 2018-02-11 | Disposition: A | Discharge status: Home / Self Care | Payer: Medicare HMO | Source: Ambulatory Visit | Attending: Urology | Admitting: Urology

## 2018-02-11 DIAGNOSIS — R109 Unspecified abdominal pain: Secondary | ICD-10-CM

## 2018-02-11 DIAGNOSIS — K8020 Calculus of gallbladder without cholecystitis without obstruction: Secondary | ICD-10-CM | POA: Diagnosis not present

## 2018-02-14 ENCOUNTER — Telehealth: Payer: Self-pay

## 2018-02-14 NOTE — Telephone Encounter (Signed)
Patient notified

## 2018-02-14 NOTE — Telephone Encounter (Signed)
-----   Message from Abbie Sons, MD sent at 02/14/2018  7:50 AM EST ----- CT showed no evidence of renal calculi, mass or evidence of obstruction.

## 2018-02-22 ENCOUNTER — Encounter: Payer: Self-pay | Admitting: Gastroenterology

## 2018-02-28 DIAGNOSIS — D485 Neoplasm of uncertain behavior of skin: Secondary | ICD-10-CM | POA: Diagnosis not present

## 2018-03-02 ENCOUNTER — Other Ambulatory Visit: Payer: Self-pay

## 2018-03-02 DIAGNOSIS — N5082 Scrotal pain: Secondary | ICD-10-CM

## 2018-03-02 MED ORDER — AMITRIPTYLINE HCL 25 MG PO TABS: 25.0000 mg | ORAL_TABLET | Freq: Every day | ORAL | 3 refills | Status: DC

## 2018-03-02 NOTE — Telephone Encounter (Signed)
Incoming request from pharmacy for refill, pt seen in the last year rx sent

## 2018-03-10 ENCOUNTER — Telehealth: Payer: Self-pay

## 2018-03-10 ENCOUNTER — Ambulatory Visit (INDEPENDENT_AMBULATORY_CARE_PROVIDER_SITE_OTHER): Payer: Medicare HMO | Admitting: Physician Assistant

## 2018-03-10 ENCOUNTER — Encounter: Payer: Self-pay | Admitting: Physician Assistant

## 2018-03-10 VITALS — BP 118/68 | HR 65 | Ht 72.0 in | Wt 237.2 lb

## 2018-03-10 DIAGNOSIS — Z1211 Encounter for screening for malignant neoplasm of colon: Secondary | ICD-10-CM

## 2018-03-10 DIAGNOSIS — K5010 Crohn's disease of large intestine without complications: Secondary | ICD-10-CM

## 2018-03-10 DIAGNOSIS — Z7902 Long term (current) use of antithrombotics/antiplatelets: Secondary | ICD-10-CM

## 2018-03-10 DIAGNOSIS — Z8719 Personal history of other diseases of the digestive system: Secondary | ICD-10-CM | POA: Diagnosis not present

## 2018-03-10 MED ORDER — NA SULFATE-K SULFATE-MG SULF 17.5-3.13-1.6 GM/177ML PO SOLN: 1.0000 | Freq: Once | ORAL | 0 refills | Status: AC

## 2018-03-10 NOTE — Patient Instructions (Signed)

## 2018-03-10 NOTE — Telephone Encounter (Signed)
   Primary Cardiologist: Fransico Him, MD  Chart reviewed as part of pre-operative protocol coverage. Given past medical history and time since last visit, based on ACC/AHA guidelines, Jacob [name] would be at acceptable risk for the planned procedure without further cardiovascular testing.   Per Dr. Radford Pax, ok for pt to hold Plavix 5 days prior to procedure.   I will route this recommendation to the requesting party via Epic fax function and remove from pre-op pool.  Please call with questions.  Lyda Jester, PA-C 03/10/2018, 11:19 AM

## 2018-03-10 NOTE — Telephone Encounter (Signed)
Left a message for patient to return my call. 

## 2018-03-10 NOTE — Progress Notes (Signed)
Subjective:    Patient ID: Jacob [name], male    DOB: [birth year], 68 y.o.   MRN: [id number]  HPI  Jacob [name] is a pleasant 67 year old white male, new to GI today referred by [name]/Jacob Lam, NP for colonoscopy. Patient has history of coronary artery disease, status post CABG in 2008, has history of TIA and bilateral carotid stenosis.  He had undergone right carotid endarterectomy in 2018.  He has been maintained on Plavix.  He very recently had Doppler of the carotids in December 2019 and is found to have stable mild 1 to 39% stenosis in the left carotid. Patient states he has history of Crohn's colitis initially diagnosed in 1984 .  He had been treated with Azulfidine in the past but has not required medications for many years.  He had undergone colonoscopy with Dr. Howell [name] in 2011 done only to the proximal ascending colon and was found to have moderate proctosigmoiditis.  Biopsy showed moderately active chronic colitis. Colonoscopy was also done in 2014 by Dr. Wynetta [name] with no active inflammation found. And has no current complaints of abdominal pain or cramping.  Movements have been normal, no melena or hematochezia.  He had been having some scrotal pain on the left recently and had undergone evaluation.  He has had prior left inguinal hernia repair but was not found to have any recurrence. Recent CT done by renal stone protocol did reveal multiple gallstones.   Review of Systems Pertinent positive and negative review of systems were noted in the above HPI section.  All other review of systems was otherwise negative.  Outpatient Encounter Medications as of 03/10/2018  Medication Sig  . amitriptyline (ELAVIL) 25 MG tablet Take 1 tablet (25 mg total) by mouth at bedtime.  Marland Kitchen atorvastatin (LIPITOR) 40 MG tablet TAKE 1 TABLET BY MOUTH ONCE DAILY  . clopidogrel (PLAVIX) 75 MG tablet TAKE 1 TABLET BY MOUTH ONCE DAILY WITH BREAKFAST  . escitalopram (LEXAPRO) 10 MG tablet Take  10 mg daily by mouth.   Marland Kitchen lisinopril (PRINIVIL,ZESTRIL) 5 MG tablet TAKE 1 TABLET BY MOUTH ONCE DAILY  . metoprolol succinate (TOPROL-XL) 25 MG 24 hr tablet TAKE 1/2 (ONE-HALF) TABLET BY MOUTH ONCE DAILY **MAKE  APPOINTMENT  WITH  MD  FOR  REFILLS**  . oxyCODONE-acetaminophen (PERCOCET/ROXICET) 5-325 MG tablet Take 1-2 tablets by mouth every 4 (four) hours as needed for moderate pain.  . Na Sulfate-K Sulfate-Mg Sulf 17.5-3.13-1.6 GM/177ML SOLN Take 1 kit by mouth once for 1 dose.   No facility-administered encounter medications on file as of 03/10/2018.    No Known Allergies Patient Active Problem List   Diagnosis Date Noted  . Scrotal pain 12/07/2017  . Carotid artery stenosis   . Confusion   . TIA (transient ischemic attack) 06/16/2016  . Coronary atherosclerosis of native coronary artery 09/14/2013  . Pure hypercholesterolemia 09/14/2013  . Essential hypertension, benign 09/14/2013  . Bradycardia, sinus 09/14/2013   Social History   Socioeconomic History  . Marital status: Married    Spouse name: Not on file  . Number of children: 5  . Years of education: Not on file  . Highest education level: Not on file  Occupational History  . Occupation: Retired   Scientific laboratory technician  . Financial resource strain: Not on file  . Food insecurity:    Worry: Not on file    Inability: Not on file  . Transportation needs:    Medical: Not on file    Non-medical: Not on  file  Tobacco Use  . Smoking status: Never Smoker  . Smokeless tobacco: Current User    Types: Snuff  Substance and Sexual Activity  . Alcohol use: No  . Drug use: No  . Sexual activity: Not on file  Lifestyle  . Physical activity:    Days per week: Not on file    Minutes per session: Not on file  . Stress: Not on file  Relationships  . Social connections:    Talks on phone: Not on file    Gets together: Not on file    Attends religious service: Not on file    Active member of club or organization: Not on file     Attends meetings of clubs or organizations: Not on file    Relationship status: Not on file  . Intimate partner violence:    Fear of current or ex partner: Not on file    Emotionally abused: Not on file    Physically abused: Not on file    Forced sexual activity: Not on file  Other Topics Concern  . Not on file  Social History Narrative  . Not on file    Jacob [name]'s family history includes CAD in his brother, brother, brother, father, and sister; Ovarian cancer in his mother.      Objective:    Vitals:   03/10/18 1001  BP: 118/68  Pulse: 65    Physical Exam; well-developed older white male in no acute distress, pleasant accompanied by his wife.  Height 6 foot, weight 237, BMI 32.1.  HEENT nontraumatic normocephalic EOMI PERRLA sclera anicteric, oral mucosa moist.  Cardiovascular regular rate and rhythm with S1-S2, sternal incisional scar, Pulmonary clear bilaterally, Abdomen soft, nontender nondistended no palpable mass or hepatosplenomegaly bowel sounds are present.  Rectal exam not done, Remedies no clubbing cyanosis or edema skin warm dry, Neuropsych alert and oriented, grossly nonfocal mood and affect appropriate       Assessment & Plan:   #58 68 year old white male referred for screening colonoscopy.  Patient does have history of Crohn's colitis/proctosigmoiditis.  Last colonoscopy per Dr. Howell [name] 2014 with no active inflammation.  Initial diagnosis made in 1984 -he has not been on any medications for several years. No current GI complaints and no symptoms consistent with active colitis.  He is at increased risk for colonic malignancy given greater than 30-year history of inflammatory bowel disease.  #2 chronic antiplatelet therapy-on Plavix #3 history of bilateral carotid stenosis, status post right carotid endarterectomy 2018, recent Doppler showed 1 to 39% stenosis of the left carotid #4 TIA #5 coronary artery disease status post CABG  #6  cholelithiasis-asymptomatic  Plan; Patient will be scheduled for Colonoscopy with Dr. Havery [name].  Procedure was discussed in detail with the patient including indications risks and benefits and he is agreeable to proceed. Patient will need to hold Plavix for 5 days prior to procedure.  Rationale, risks and benefits of holding antiplatelet therapy were discussed with the patient.  We will communicate with his cardiologist Dr. Golden Hurter, to assure that holding Plavix is acceptable for this patient.   Genia Harold PA-C 03/10/2018   Cc: Philmore Pali, NP

## 2018-03-10 NOTE — Telephone Encounter (Signed)
OK to be off plavix for procedure

## 2018-03-10 NOTE — Telephone Encounter (Signed)
[facility] Pre-operative Risk Assessment     Request for surgical clearance:     Endoscopy Procedure  What type of surgery is being performed?     Colonoscopy  When is this surgery scheduled?     03/25/18  What type of clearance is required ?   Pharmacy  Are there any medications that need to be held prior to surgery and how long? Plavix x 5 days  Practice name and name of physician performing surgery?      [facility]  What is your office phone and fax number?      Phone- [phone]  [phone]  Anesthesia type (None, local, MAC, general) ?       MAC

## 2018-03-11 ENCOUNTER — Encounter: Payer: Self-pay | Admitting: Gastroenterology

## 2018-03-11 NOTE — Progress Notes (Signed)
Agree with assessment and plan as outlined.  

## 2018-03-11 NOTE — Telephone Encounter (Signed)
Patient notified to hold Plavix 5 days prior to his procedure per Cardiology. Patient verbalized understanding.

## 2018-03-14 DIAGNOSIS — Z6832 Body mass index (BMI) 32.0-32.9, adult: Secondary | ICD-10-CM | POA: Diagnosis not present

## 2018-03-14 DIAGNOSIS — Z79890 Hormone replacement therapy (postmenopausal): Secondary | ICD-10-CM | POA: Diagnosis not present

## 2018-03-14 DIAGNOSIS — R69 Illness, unspecified: Secondary | ICD-10-CM | POA: Diagnosis not present

## 2018-03-14 DIAGNOSIS — Z9181 History of falling: Secondary | ICD-10-CM | POA: Diagnosis not present

## 2018-03-14 DIAGNOSIS — Z125 Encounter for screening for malignant neoplasm of prostate: Secondary | ICD-10-CM | POA: Diagnosis not present

## 2018-03-14 DIAGNOSIS — I2510 Atherosclerotic heart disease of native coronary artery without angina pectoris: Secondary | ICD-10-CM | POA: Diagnosis not present

## 2018-03-14 DIAGNOSIS — C44310 Basal cell carcinoma of skin of unspecified parts of face: Secondary | ICD-10-CM | POA: Diagnosis not present

## 2018-03-16 ENCOUNTER — Other Ambulatory Visit: Payer: Self-pay | Admitting: Cardiology

## 2018-03-23 ENCOUNTER — Ambulatory Visit: Payer: Medicare HMO | Admitting: Cardiology

## 2018-03-23 DIAGNOSIS — C44319 Basal cell carcinoma of skin of other parts of face: Secondary | ICD-10-CM | POA: Diagnosis not present

## 2018-03-23 DIAGNOSIS — L988 Other specified disorders of the skin and subcutaneous tissue: Secondary | ICD-10-CM | POA: Diagnosis not present

## 2018-03-23 DIAGNOSIS — L578 Other skin changes due to chronic exposure to nonionizing radiation: Secondary | ICD-10-CM | POA: Diagnosis not present

## 2018-03-23 DIAGNOSIS — L814 Other melanin hyperpigmentation: Secondary | ICD-10-CM | POA: Diagnosis not present

## 2018-03-25 ENCOUNTER — Ambulatory Visit: Payer: Medicare HMO | Admitting: Gastroenterology

## 2018-03-25 ENCOUNTER — Ambulatory Visit (AMBULATORY_SURGERY_CENTER): Payer: Medicare HMO | Admitting: Gastroenterology

## 2018-03-25 ENCOUNTER — Encounter: Payer: Self-pay | Admitting: Gastroenterology

## 2018-03-25 ENCOUNTER — Other Ambulatory Visit: Payer: Self-pay

## 2018-03-25 VITALS — BP 155/90 | HR 68 | Temp 99.1°F | Ht 72.0 in | Wt 237.0 lb

## 2018-03-25 VITALS — BP 108/80 | HR 81 | Temp 99.1°F | Resp 20 | Ht 72.0 in | Wt 237.0 lb

## 2018-03-25 DIAGNOSIS — D128 Benign neoplasm of rectum: Secondary | ICD-10-CM

## 2018-03-25 DIAGNOSIS — K5010 Crohn's disease of large intestine without complications: Secondary | ICD-10-CM | POA: Diagnosis not present

## 2018-03-25 DIAGNOSIS — K621 Rectal polyp: Secondary | ICD-10-CM

## 2018-03-25 DIAGNOSIS — K6389 Other specified diseases of intestine: Secondary | ICD-10-CM | POA: Diagnosis not present

## 2018-03-25 DIAGNOSIS — K6289 Other specified diseases of anus and rectum: Secondary | ICD-10-CM | POA: Diagnosis not present

## 2018-03-25 DIAGNOSIS — K529 Noninfective gastroenteritis and colitis, unspecified: Secondary | ICD-10-CM | POA: Diagnosis not present

## 2018-03-25 DIAGNOSIS — Z1211 Encounter for screening for malignant neoplasm of colon: Secondary | ICD-10-CM

## 2018-03-25 DIAGNOSIS — K640 First degree hemorrhoids: Secondary | ICD-10-CM

## 2018-03-25 MED ORDER — FLEET ENEMA 7-19 GM/118ML RE ENEM
1.0000 | ENEMA | Freq: Once | RECTAL | Status: AC
  Administered 2018-03-25: 1 via RECTAL

## 2018-03-25 MED ORDER — SODIUM CHLORIDE 0.9 % IV SOLN: 500.0000 mL | Freq: Once | INTRAVENOUS | Status: DC

## 2018-03-25 NOTE — Progress Notes (Signed)
Repeated enema as results were still brown and cloudy.  Second enema done around 0940, still brown cloudy results. Dr Havery Moros notified and will come and speak with the pt.

## 2018-03-25 NOTE — Op Note (Signed)
Jacob [name]: Jacob [name] Procedure Date: 03/25/2018 2:19 PM MRN: [id number] Endoscopist: Gerrit Heck , MD Age: 68 Referring MD:  Date of Birth: Jan 18, 1951 Gender: Male Account #: [id number] Procedure:                Colonoscopy Indications:              High risk colon cancer surveillance: Crohn's                            colitis of 8 (or more) years duration                           68 yo male with a history of Crohns                            proctosigmoiditis diagnosed in 1984, presents for                            ongoing surveillance. He has been off all IBD                            medications for many years, and without any active                            GI symptoms. Last colonsocopy was in 2014 and                            without any active or chronic inflammatory changes.                            Prior to that, a colonoscopy in 2011 (done only to                            the proximal ascending colon) was found to have                            moderate proctosigmoiditis. Biopsy showed                            moderately active chronic colitis. Medicines:                Monitored Anesthesia Care Procedure:                Pre-Anesthesia Assessment:                           - Prior to the procedure, a History and Physical                            was performed, and patient medications and                            allergies were reviewed. The patient's tolerance of  previous anesthesia was also reviewed. The risks                            and benefits of the procedure and the sedation                            options and risks were discussed with the patient.                            All questions were answered, and informed consent                            was obtained. Prior Anticoagulants: The patient has                            taken Plavix (clopidogrel), last dose was 5 days                      prior to procedure. ASA Grade Assessment: III - A                            patient with severe systemic disease. After                            reviewing the risks and benefits, the patient was                            deemed in satisfactory condition to undergo the                            procedure.                           After obtaining informed consent, the colonoscope                            was passed under direct vision. Throughout the                            procedure, the patient's blood pressure, pulse, and                            oxygen saturations were monitored continuously. The                            Model CF-HQ190L [id number]) scope was introduced                            through the anus and advanced to the the terminal                            ileum. The colonoscopy was performed without  difficulty. The patient tolerated the procedure                            well. The quality of the bowel preparation was                            adequate. The terminal ileum, ileocecal valve,                            appendiceal orifice, and rectum were photographed. Scope In: 2:23:46 PM Scope Out: 2:50:33 PM Scope Withdrawal Time: 0 hours 20 minutes 27 seconds  Total Procedure Duration: 0 hours 26 minutes 47 seconds  Findings:                 The perianal and digital rectal examinations were                            normal.                           A 6 mm polyp was found in the rectum. The polyp was                            sessile. The polyp was removed with a cold snare.                            Resection and retrieval were complete. Estimated                            blood loss was minimal.                           A scattered area of mild melanosis was found in the                            rectum, in the recto-sigmoid colon, in the sigmoid                            colon, in the descending  colon and in the ascending                            colon. Biopsies were taken with a cold forceps for                            histology. Estimated blood loss was minimal.                           The visualized mucosa was otherwise normal                            appearing throughout the colon. There were no areas                            of mucosal erythema, edema,  erosions, or ulceration                            noted. Biopsies were taken with a cold forceps from                            the ascending colon, transverse colon, descending                            colon, sigmoid colon and rectum for evidence of                            chronic inflammatory changes (Crohn's disease                            surveillance) and dysplasia surveillance. These                            biopsy specimens were sent to Pathology. Estimated                            blood loss was minimal.                           Non-bleeding internal hemorrhoids were found during                            retroflexion. The hemorrhoids were small.                           The terminal ileum appeared normal. Complications:            No immediate complications. Estimated Blood Loss:     Estimated blood loss was minimal. Impression:               - One 6 mm polyp in the rectum, removed with a cold                            snare. Resected and retrieved.                           - Benign Melanosis in the colon. Biopsied.                           - Otherwise, normal mucosa in the entire examined                            colon. Biopsied.                           - Non-bleeding internal hemorrhoids.                           - The examined portion of the ileum was normal. Recommendation:           - Patient has a contact number available for  emergencies. The signs and symptoms of potential                            delayed complications were discussed with the                             patient. Return to normal activities tomorrow.                            Written discharge instructions were provided to the                            patient.                           - Resume previous diet today.                           - Continue present medications.                           - Await pathology results.                           - Repeat colonoscopy for surveillance based on                            pathology results.                           - Return to GI clinic in 6-12 months, or sooner as                            needed. Gerrit Heck, MD 03/25/2018 3:03:24 PM

## 2018-03-25 NOTE — Progress Notes (Signed)
Called to room to assist during endoscopic procedure.  Patient ID and intended procedure confirmed with present staff. Received instructions for my participation in the procedure from the performing physician.  

## 2018-03-25 NOTE — Patient Instructions (Signed)
Handouts given on polyps and hemorrhoids. Resume Plavix tomorrow.    YOU HAD AN ENDOSCOPIC PROCEDURE TODAY AT [facility]:   Refer to the procedure report that was given to you for any specific questions about what was found during the examination.  If the procedure report does not answer your questions, please call your gastroenterologist to clarify.  If you requested that your care partner not be given the details of your procedure findings, then the procedure report has been included in a sealed envelope for you to review at your convenience later.  YOU SHOULD EXPECT: Some feelings of bloating in the abdomen. Passage of more gas than usual.  Walking can help get rid of the air that was put into your GI tract during the procedure and reduce the bloating. If you had a lower endoscopy (such as a colonoscopy or flexible sigmoidoscopy) you may notice spotting of blood in your stool or on the toilet paper. If you underwent a bowel prep for your procedure, you may not have a normal bowel movement for a few days.  Please Note:  You might notice some irritation and congestion in your nose or some drainage.  This is from the oxygen used during your procedure.  There is no need for concern and it should clear up in a day or so.  SYMPTOMS TO REPORT IMMEDIATELY:   Following lower endoscopy (colonoscopy or flexible sigmoidoscopy):  Excessive amounts of blood in the stool  Significant tenderness or worsening of abdominal pains  Swelling of the abdomen that is new, acute  Fever of 100F or higher   For urgent or emergent issues, a gastroenterologist can be reached at any hour by calling [phone].   DIET:  We do recommend a small meal at first, but then you may proceed to your regular diet.  Drink plenty of fluids but you should avoid alcoholic beverages for 24 hours.  ACTIVITY:  You should plan to take it easy for the rest of today and you should NOT DRIVE or use heavy machinery  until tomorrow (because of the sedation medicines used during the test).    FOLLOW UP: Our staff will call the number listed on your records the next business day following your procedure to check on you and address any questions or concerns that you may have regarding the information given to you following your procedure. If we do not reach you, we will leave a message.  However, if you are feeling well and you are not experiencing any problems, there is no need to return our call.  We will assume that you have returned to your regular daily activities without incident.  If any biopsies were taken you will be contacted by phone or by letter within the next 1-3 weeks.  Please call us at [phone] if you have not heard about the biopsies in 3 weeks.    SIGNATURES/CONFIDENTIALITY: You and/or your care partner have signed paperwork which will be entered into your electronic medical record.  These signatures attest to the fact that that the information above on your After Visit Summary has been reviewed and is understood.  Full responsibility of the confidentiality of this discharge information lies with you and/or your care-partner.

## 2018-03-25 NOTE — Progress Notes (Signed)
Patient had 2 enemas with results that were still cloudy brown. Dr. Havery Moros spoke with the patient and ordered him to take a second dose of suprep. He was instructed to drink both bottles by 12 noon and to follow with 16 oz of water. NPO after 1:00. Pt will be done at 3:00 per Dr. Bryan Lemma. Pt and wife verbalized understanding. IV dc'd see IV flow sheet. SM

## 2018-03-25 NOTE — Progress Notes (Signed)
PT taken to PACU. Monitors in place. VSS. Report given to RN. 

## 2018-03-25 NOTE — Progress Notes (Signed)
Pt had reported brown cloudy liquid as last bm at 0630.  Report to Dr Havery Moros, order for fleets enema received.

## 2018-03-28 ENCOUNTER — Telehealth: Payer: Self-pay

## 2018-03-28 NOTE — Telephone Encounter (Signed)
  Follow up Call-  Call back number 03/25/2018 03/25/2018  Post procedure Call Back phone  # [phone]  Permission to leave phone message Yes Yes  Some recent data might be hidden     Patient questions:  Do you have a fever, pain , or abdominal swelling? No. Pain Score  0 *  Have you tolerated food without any problems? Yes.    Have you been able to return to your normal activities? Yes.    Do you have any questions about your discharge instructions: Diet   No. Medications  No. Follow up visit  No.  Do you have questions or concerns about your Care? No.  Actions: * If pain score is 4 or above: No action needed, pain <4.

## 2018-03-28 NOTE — Telephone Encounter (Signed)
  Follow up Call-  Call back number 03/25/2018 03/25/2018  Post procedure Call Back phone  # [phone]  Permission to leave phone message Yes Yes  Some recent data might be hidden     Patient questions:  Do you have a fever, pain , or abdominal swelling? No. Pain Score  0 *  Have you tolerated food without any problems? Yes.    Have you been able to return to your normal activities? Yes.    Do you have any questions about your discharge instructions: Diet   No. Medications  No. Follow up visit  No.  Do you have questions or concerns about your Care? No.  Actions: * If pain score is 4 or above: No action needed, pain <4.

## 2018-04-07 ENCOUNTER — Encounter: Payer: Self-pay | Admitting: Gastroenterology

## 2018-04-18 ENCOUNTER — Other Ambulatory Visit: Payer: Self-pay | Admitting: Cardiology

## 2018-04-18 ENCOUNTER — Other Ambulatory Visit (INDEPENDENT_AMBULATORY_CARE_PROVIDER_SITE_OTHER): Payer: Self-pay | Admitting: Vascular Surgery

## 2018-04-21 NOTE — Telephone Encounter (Signed)
This should be deferred this to his cardiologist.  We haven't seen him since his 1st carotid follow up in 2018. Since then his cardiologist has taken over following his carotid stenosis.

## 2018-04-21 NOTE — Telephone Encounter (Signed)
This patient had a carotid endarterectomy on 06/2016. He no showed his last apt with [place] and does not have another appointment coming up. Please advise.

## 2018-04-22 ENCOUNTER — Other Ambulatory Visit (INDEPENDENT_AMBULATORY_CARE_PROVIDER_SITE_OTHER): Payer: Self-pay | Admitting: Vascular Surgery

## 2018-04-25 NOTE — Telephone Encounter (Signed)
He should contact his cardiologist for this refill.  His cardiologist has him on plavix for cardiac reasons and he is also following him for his carotid stenosis.

## 2018-04-25 NOTE — Telephone Encounter (Signed)
Patient was last seen 07/15/16 but no show on 08/14/16 should I refill medication

## 2018-05-05 ENCOUNTER — Other Ambulatory Visit (INDEPENDENT_AMBULATORY_CARE_PROVIDER_SITE_OTHER): Payer: Self-pay | Admitting: Vascular Surgery

## 2018-05-31 ENCOUNTER — Other Ambulatory Visit (HOSPITAL_COMMUNITY): Payer: Self-pay | Admitting: Cardiology

## 2018-05-31 DIAGNOSIS — I6523 Occlusion and stenosis of bilateral carotid arteries: Secondary | ICD-10-CM

## 2018-06-05 ENCOUNTER — Other Ambulatory Visit: Payer: Self-pay | Admitting: Cardiology

## 2018-07-13 ENCOUNTER — Other Ambulatory Visit: Payer: Self-pay | Admitting: Cardiology

## 2018-09-13 ENCOUNTER — Telehealth: Payer: Self-pay

## 2018-09-13 NOTE — Telephone Encounter (Signed)
PA received for Jacob [name] per Dr. Bernardo Heater patient should be stopping medication soon to see if pain has resolved. Called patient to check on status he states he has tapered off and is doing well with out the medication. PA not needed at this time patient not continuing medication

## 2018-09-14 DIAGNOSIS — Z9889 Other specified postprocedural states: Secondary | ICD-10-CM | POA: Diagnosis not present

## 2018-09-14 DIAGNOSIS — R69 Illness, unspecified: Secondary | ICD-10-CM | POA: Diagnosis not present

## 2018-09-14 DIAGNOSIS — Z6832 Body mass index (BMI) 32.0-32.9, adult: Secondary | ICD-10-CM | POA: Diagnosis not present

## 2018-09-14 DIAGNOSIS — I2510 Atherosclerotic heart disease of native coronary artery without angina pectoris: Secondary | ICD-10-CM | POA: Diagnosis not present

## 2018-09-19 DIAGNOSIS — H11001 Unspecified pterygium of right eye: Secondary | ICD-10-CM | POA: Diagnosis not present

## 2018-09-19 DIAGNOSIS — H5310 Unspecified subjective visual disturbances: Secondary | ICD-10-CM | POA: Diagnosis not present

## 2018-09-19 DIAGNOSIS — Z961 Presence of intraocular lens: Secondary | ICD-10-CM | POA: Diagnosis not present

## 2018-09-19 DIAGNOSIS — H1851 Endothelial corneal dystrophy: Secondary | ICD-10-CM | POA: Diagnosis not present

## 2018-11-07 ENCOUNTER — Other Ambulatory Visit: Payer: Self-pay | Admitting: Cardiology

## 2018-12-12 DIAGNOSIS — Z23 Encounter for immunization: Secondary | ICD-10-CM | POA: Diagnosis not present

## 2018-12-28 DIAGNOSIS — I25119 Atherosclerotic heart disease of native coronary artery with unspecified angina pectoris: Secondary | ICD-10-CM | POA: Diagnosis not present

## 2018-12-28 DIAGNOSIS — I4891 Unspecified atrial fibrillation: Secondary | ICD-10-CM | POA: Diagnosis not present

## 2018-12-28 DIAGNOSIS — E669 Obesity, unspecified: Secondary | ICD-10-CM | POA: Diagnosis not present

## 2018-12-28 DIAGNOSIS — I252 Old myocardial infarction: Secondary | ICD-10-CM | POA: Diagnosis not present

## 2018-12-28 DIAGNOSIS — D6869 Other thrombophilia: Secondary | ICD-10-CM | POA: Diagnosis not present

## 2018-12-28 DIAGNOSIS — I10 Essential (primary) hypertension: Secondary | ICD-10-CM | POA: Diagnosis not present

## 2018-12-28 DIAGNOSIS — E785 Hyperlipidemia, unspecified: Secondary | ICD-10-CM | POA: Diagnosis not present

## 2018-12-28 DIAGNOSIS — R69 Illness, unspecified: Secondary | ICD-10-CM | POA: Diagnosis not present

## 2018-12-28 DIAGNOSIS — I739 Peripheral vascular disease, unspecified: Secondary | ICD-10-CM | POA: Diagnosis not present

## 2018-12-29 DIAGNOSIS — Z1211 Encounter for screening for malignant neoplasm of colon: Secondary | ICD-10-CM | POA: Diagnosis not present

## 2018-12-29 DIAGNOSIS — E785 Hyperlipidemia, unspecified: Secondary | ICD-10-CM | POA: Diagnosis not present

## 2018-12-29 DIAGNOSIS — Z6832 Body mass index (BMI) 32.0-32.9, adult: Secondary | ICD-10-CM | POA: Diagnosis not present

## 2018-12-29 DIAGNOSIS — Z1331 Encounter for screening for depression: Secondary | ICD-10-CM | POA: Diagnosis not present

## 2018-12-29 DIAGNOSIS — Z0000 Encounter for general adult medical examination without abnormal findings: Secondary | ICD-10-CM | POA: Diagnosis not present

## 2018-12-29 DIAGNOSIS — Z9181 History of falling: Secondary | ICD-10-CM | POA: Diagnosis not present

## 2018-12-29 DIAGNOSIS — Z125 Encounter for screening for malignant neoplasm of prostate: Secondary | ICD-10-CM | POA: Diagnosis not present

## 2019-01-14 ENCOUNTER — Other Ambulatory Visit: Payer: Self-pay | Admitting: Cardiology

## 2019-03-07 DIAGNOSIS — R69 Illness, unspecified: Secondary | ICD-10-CM | POA: Diagnosis not present

## 2019-03-07 DIAGNOSIS — E669 Obesity, unspecified: Secondary | ICD-10-CM | POA: Diagnosis not present

## 2019-03-07 DIAGNOSIS — I252 Old myocardial infarction: Secondary | ICD-10-CM | POA: Diagnosis not present

## 2019-03-07 DIAGNOSIS — F419 Anxiety disorder, unspecified: Secondary | ICD-10-CM | POA: Diagnosis not present

## 2019-03-07 DIAGNOSIS — I2510 Atherosclerotic heart disease of native coronary artery without angina pectoris: Secondary | ICD-10-CM | POA: Diagnosis not present

## 2019-03-07 DIAGNOSIS — Z008 Encounter for other general examination: Secondary | ICD-10-CM | POA: Diagnosis not present

## 2019-03-07 DIAGNOSIS — N529 Male erectile dysfunction, unspecified: Secondary | ICD-10-CM | POA: Diagnosis not present

## 2019-03-07 DIAGNOSIS — I10 Essential (primary) hypertension: Secondary | ICD-10-CM | POA: Diagnosis not present

## 2019-03-07 DIAGNOSIS — Z6831 Body mass index (BMI) 31.0-31.9, adult: Secondary | ICD-10-CM | POA: Diagnosis not present

## 2019-03-07 DIAGNOSIS — E785 Hyperlipidemia, unspecified: Secondary | ICD-10-CM | POA: Diagnosis not present

## 2019-03-07 DIAGNOSIS — Z809 Family history of malignant neoplasm, unspecified: Secondary | ICD-10-CM | POA: Diagnosis not present

## 2019-03-09 ENCOUNTER — Other Ambulatory Visit: Payer: Self-pay | Admitting: Urology

## 2019-03-09 DIAGNOSIS — N5082 Scrotal pain: Secondary | ICD-10-CM

## 2019-03-15 ENCOUNTER — Other Ambulatory Visit: Payer: Self-pay | Admitting: Cardiology

## 2019-03-16 DIAGNOSIS — I2510 Atherosclerotic heart disease of native coronary artery without angina pectoris: Secondary | ICD-10-CM | POA: Diagnosis not present

## 2019-03-16 DIAGNOSIS — E785 Hyperlipidemia, unspecified: Secondary | ICD-10-CM | POA: Diagnosis not present

## 2019-03-16 DIAGNOSIS — R69 Illness, unspecified: Secondary | ICD-10-CM | POA: Diagnosis not present

## 2019-03-16 DIAGNOSIS — Z23 Encounter for immunization: Secondary | ICD-10-CM | POA: Diagnosis not present

## 2019-03-16 DIAGNOSIS — Z9889 Other specified postprocedural states: Secondary | ICD-10-CM | POA: Diagnosis not present

## 2019-03-16 DIAGNOSIS — I10 Essential (primary) hypertension: Secondary | ICD-10-CM | POA: Diagnosis not present

## 2019-03-16 DIAGNOSIS — M792 Neuralgia and neuritis, unspecified: Secondary | ICD-10-CM | POA: Diagnosis not present

## 2019-03-16 DIAGNOSIS — Z6832 Body mass index (BMI) 32.0-32.9, adult: Secondary | ICD-10-CM | POA: Diagnosis not present

## 2019-04-16 ENCOUNTER — Other Ambulatory Visit: Payer: Self-pay | Admitting: Cardiology

## 2019-04-24 ENCOUNTER — Other Ambulatory Visit: Payer: Self-pay | Admitting: Cardiology

## 2019-05-01 ENCOUNTER — Other Ambulatory Visit: Payer: Self-pay

## 2019-05-01 MED ORDER — LISINOPRIL 5 MG PO TABS: 5.0000 mg | ORAL_TABLET | Freq: Every day | ORAL | 0 refills | Status: DC

## 2019-05-09 ENCOUNTER — Other Ambulatory Visit: Payer: Self-pay | Admitting: Cardiology

## 2019-05-11 ENCOUNTER — Other Ambulatory Visit: Payer: Self-pay

## 2019-05-11 MED ORDER — METOPROLOL SUCCINATE ER 25 MG PO TB24: 12.5000 mg | ORAL_TABLET | Freq: Every day | ORAL | 0 refills | Status: DC

## 2019-05-11 MED ORDER — LISINOPRIL 5 MG PO TABS: 5.0000 mg | ORAL_TABLET | Freq: Every day | ORAL | 0 refills | Status: DC

## 2019-05-17 ENCOUNTER — Other Ambulatory Visit: Payer: Self-pay

## 2019-05-17 ENCOUNTER — Encounter: Payer: Self-pay | Admitting: Cardiology

## 2019-05-17 ENCOUNTER — Telehealth (INDEPENDENT_AMBULATORY_CARE_PROVIDER_SITE_OTHER): Payer: Medicare HMO | Admitting: Cardiology

## 2019-05-17 VITALS — BP 154/104 | HR 71 | Ht 72.0 in | Wt 230.0 lb

## 2019-05-17 DIAGNOSIS — I6523 Occlusion and stenosis of bilateral carotid arteries: Secondary | ICD-10-CM | POA: Diagnosis not present

## 2019-05-17 DIAGNOSIS — I739 Peripheral vascular disease, unspecified: Secondary | ICD-10-CM | POA: Diagnosis not present

## 2019-05-17 DIAGNOSIS — E7800 Pure hypercholesterolemia, unspecified: Secondary | ICD-10-CM

## 2019-05-17 DIAGNOSIS — I2510 Atherosclerotic heart disease of native coronary artery without angina pectoris: Secondary | ICD-10-CM | POA: Diagnosis not present

## 2019-05-17 DIAGNOSIS — R001 Bradycardia, unspecified: Secondary | ICD-10-CM

## 2019-05-17 DIAGNOSIS — I10 Essential (primary) hypertension: Secondary | ICD-10-CM | POA: Diagnosis not present

## 2019-05-17 MED ORDER — LISINOPRIL 10 MG PO TABS: 10.0000 mg | ORAL_TABLET | Freq: Every day | ORAL | 3 refills | Status: DC

## 2019-05-17 NOTE — Progress Notes (Signed)
Virtual Visit via Telephone Note   This visit type was conducted due to national recommendations for restrictions regarding the COVID-19 Pandemic (e.g. social distancing) in an effort to limit this patient's exposure and mitigate transmission in our community.  Due to his co-morbid illnesses, this patient is at least at moderate risk for complications without adequate follow up.  This format is felt to be most appropriate for this patient at this time.  The patient did not have access to video technology/had technical difficulties with video requiring transitioning to audio format only (telephone).  All issues noted in this document were discussed and addressed.  No physical exam could be performed with this format.  Please refer to the patient's chart for his  consent to telehealth for [facility].   Evaluation Performed:  Follow-up visit  This visit type was conducted due to national recommendations for restrictions regarding the COVID-19 Pandemic (e.g. social distancing).  This format is felt to be most appropriate for this patient at this time.  All issues noted in this document were discussed and addressed.  No physical exam was performed (except for noted visual exam findings with Video Visits).  Please refer to the patient's chart (MyChart message for video visits and phone note for telephone visits) for the patient's consent to telehealth for [facility].  Date:  05/17/2019   ID:  Jacob [name], DOB 1950/08/21, MRN [id number]  Patient Location:  Home  Provider location:   [place]  PCP:  Philmore Pali, NP  Cardiologist:  Fransico Him, MD  Electrophysiologist:  None   Chief Complaint:  CAD, HTN, HLD  History of Present Illness:    Jacob [name] is a 69 y.o. male who presents via audio/video conferencing for a telehealth visit today.    Jacob [name] is a 69 y.o. male with a hx of multivessel ASCAD s/p CABGands/p PCI of SVG to RI 2008, dyslipidemia and HTN. He  is here today for followup and is doing well.  He denies any chest pain or pressure, SOB, DOE (except for walking up and down steps), PND, orthopnea, LE edema, dizziness (except when standing up too fast), palpitations or syncope. He complains of achiness in his calves when he walks and improves with walking. He is compliant with his meds and is tolerating meds with no SE.    The patient does not have symptoms concerning for COVID-19 infection (fever, chills, cough, or new shortness of breath).   Prior CV studies:   The following studies were reviewed today:  Outside labs form PCP  Past Medical History:  Diagnosis Date  . CAD (coronary artery disease), native coronary artery     multivessel ASCAD s/p CABG and s/p PCI of SVG to RI 2008  . Carotid artery stenosis    95% right and 50% left s/p right CEA 06/2016 with dopplers 12/2017 with 1-39% stenosis  . Crohn's disease ([place])    flare 2000  . Depression   . Dyslipidemia   . Dyspnea   . ED (erectile dysfunction)   . History of blood transfusion 2005  . Hypertension   . Myocardial infarction ([place]) 2005   5 bypasses  . Nephrolithiasis   . Stroke [facility])    TIA- Endartarectemy R side  . TIA (transient ischemic attack)   . Ulcerative colitis ([place])    Left sided   Past Surgical History:  Procedure Laterality Date  . CARDIAC CATHETERIZATION  06/08   Patnet grafts LAD, marginal, distal right  .  carpel tunnel surgery Left   . COLONOSCOPY    . COLONOSCOPY WITH PROPOFOL N/A 10/25/2012   Procedure: COLONOSCOPY WITH PROPOFOL;  Surgeon: Garlan Fair, MD;  Location: [facility] ENDOSCOPY;  Service: Endoscopy;  Laterality: N/A;  . CORONARY ARTERY BYPASS GRAFT  2005   ASCVD,multivessel,S/P CABG  x5 total per patient  . ENDARTERECTOMY Right 06/24/2016   Procedure: ENDARTERECTOMY CAROTID;  Surgeon: Algernon Huxley, MD;  Location: [facility] ORS;  Service: Vascular;  Laterality: Right;  . HERNIA REPAIR  many years ago  . KNEE ARTHROSCOPY Right 1995     Current  Meds  Medication Sig  . amitriptyline (ELAVIL) 25 MG tablet Take 1 tablet (25 mg total) by mouth at bedtime.  Marland Kitchen atorvastatin (LIPITOR) 40 MG tablet TAKE 1 TABLET BY MOUTH ONCE DAILY AT 6PM  . clopidogrel (PLAVIX) 75 MG tablet TAKE 1 TABLET BY MOUTH ONCE DAILY WITH BREAKFAST  . escitalopram (LEXAPRO) 10 MG tablet Take 10 mg daily by mouth.   Marland Kitchen ibuprofen (ADVIL,MOTRIN) 200 MG tablet Take 200 mg by mouth every 6 (six) hours as needed.  Marland Kitchen lisinopril (ZESTRIL) 5 MG tablet Take 1 tablet (5 mg total) by mouth daily. Please keep upcoming appt in April with Dr. Radford Pax before anymore refills. Thank you  . metoprolol succinate (TOPROL-XL) 25 MG 24 hr tablet Take 0.5 tablets (12.5 mg total) by mouth daily. Please keep follow up appointment to continue receiving refills. Thank you.   Current Facility-Administered Medications for the 05/17/19 encounter (Telemedicine) with Sueanne Margarita, MD  Medication  . 0.9 %  sodium chloride infusion  . 0.9 %  sodium chloride infusion     Allergies:   Patient has no known allergies.   Social History   Tobacco Use  . Smoking status: Never Smoker  . Smokeless tobacco: Current User    Types: Snuff  Substance Use Topics  . Alcohol use: No  . Drug use: No     Family Hx: The patient's family history includes CAD in his brother, brother, brother, father, and sister; Ovarian cancer in his mother. There is no history of Colon cancer, Esophageal cancer, Stomach cancer, Rectal cancer, or Colon polyps.  ROS:   Please see the history of present illness.     All other systems reviewed and are negative.   Labs/Other Tests and Data Reviewed:    Recent Labs: No results found for requested labs within last 8760 hours.   Recent Lipid Panel Lab Results  Component Value Date/Time   CHOL 127 06/17/2016 05:23 AM   TRIG 111 06/17/2016 05:23 AM   HDL 39 (L) 06/17/2016 05:23 AM   CHOLHDL 3.3 06/17/2016 05:23 AM   LDLCALC 66 06/17/2016 05:23 AM   LDLDIRECT 71 01/24/2013  09:14 AM    Wt Readings from Last 3 Encounters:  05/17/19 230 lb (104.3 kg)  03/25/18 237 lb (107.5 kg)  03/25/18 237 lb (107.5 kg)     Objective:    Vital Signs:  BP (!) 154/104   Pulse 71   Ht 6' (1.829 m)   Wt 230 lb (104.3 kg)   BMI 31.19 kg/m     ASSESSMENT & PLAN:    1.  ASCAD  -s/p multivessel ASCAD s/p CABGands/p PCI of SVG to RI 2008.   -he denies any anginal symptoms -continue Plavix 71m daily, BB and statin  2.  HTN  -BP has been controlled at home -continue Lopressor 12.517mdaily -increase Lisinopril to 1062maily -HTN clinic in 1 week with BMET  at same time -encouraged him to follow a low Na diet  3.  Carotid artery stenosis -CT Angio of the neck 06/16/2016 showed severe near occlusive stenosis of the right ICA and 40 to 50% plaque in the proximal left ICA as well as atheromatous plaque in the left vertebral artery with moderate to severe stenosis.  -Status post right CEA 06/2016.  -dopplers 12/2017 showed 1-39% bilateral stenosis -repeat dopplers in 1 year -continue on Plavix and ASA  4.  Bradycardia  -resolved   5.  Hyperlipidemia  -LDL goal is less than 70.    -FLP and CMET in 1 week -continue Atorvastatin 25m daily  COVID-19 Education: The signs and symptoms of COVID-19 were discussed with the patient and how to seek care for testing (follow up with PCP or arrange E-visit).  The importance of social distancing was discussed today.  Patient Risk:   After full review of this patient's clinical status, I feel that they are at least moderate risk at this time.  Time:   Today, I have spent 20 minutes on telemedicine discussing medical problems including CAD, HTN, HLD, carotid stenosis and reviewing patient's chart including carotid dopplers.  Medication Adjustments/Labs and Tests Ordered: Current medicines are reviewed at length with the patient today.  Concerns regarding medicines are outlined above.  Tests Ordered: No orders of the  defined types were placed in this encounter.  Medication Changes: No orders of the defined types were placed in this encounter.   Disposition:  Follow up in 1 year(s)  Signed, TFransico Him MD  05/17/2019 8:45 AM    [facility]

## 2019-05-17 NOTE — Patient Instructions (Signed)
Medication Instructions:  Your physician has recommended you make the following change in your medication:  1) INCREASE lisinopril to 10 mg daily *If you need a refill on your cardiac medications before your next appointment, please call your pharmacy*   Lab Work: Fasting lipids and CMET in 2 weeks  If you have labs (blood work) drawn today and your tests are completely normal, you will receive your results only by: Marland Kitchen MyChart Message (if you have MyChart) OR . A paper copy in the mail If you have any lab test that is abnormal or we need to change your treatment, we will call you to review the results.   Testing/Procedures: Your physician has requested that you have a carotid duplex in December 2021. This test is an ultrasound of the carotid arteries in your neck. It looks at blood flow through these arteries that supply the brain with blood. Allow one hour for this exam. There are no restrictions or special instructions.  Your physician has requested that you have a lower extremity arterial exercise duplex. During this test, exercise and ultrasound are used to evaluate arterial blood flow in the legs. Allow one hour for this exam. There are no restrictions or special instructions.   Follow-Up: At [facility], you and your health needs are our priority.  As part of our continuing mission to provide you with exceptional heart care, we have created designated Provider Care Teams.  These Care Teams include your primary Cardiologist (physician) and Advanced Practice Providers (APPs -  Physician Assistants and Nurse Practitioners) who all work together to provide you with the care you need, when you need it.  We recommend signing up for the patient portal called "MyChart".  Sign up information is provided on this After Visit Summary.  MyChart is used to connect with patients for Virtual Visits (Telemedicine).  Patients are able to view lab/test results, encounter notes, upcoming appointments,  etc.  Non-urgent messages can be sent to your provider as well.   To learn more about what you can do with MyChart, go to [email or web address].    Your next appointment:   1 year(s)  The format for your next appointment:   In Person  Provider:   Fransico Him, MD   Other Instructions You have been referred to follow up with our PharmD in the Hypertension Clinic in two weeks.

## 2019-05-17 NOTE — Addendum Note (Signed)
Addended by: Antonieta Iba on: 05/17/2019 09:08 AM   Modules accepted: Orders

## 2019-05-19 ENCOUNTER — Other Ambulatory Visit (HOSPITAL_COMMUNITY): Payer: Self-pay | Admitting: Cardiology

## 2019-05-19 DIAGNOSIS — M79604 Pain in right leg: Secondary | ICD-10-CM

## 2019-05-19 DIAGNOSIS — M79605 Pain in left leg: Secondary | ICD-10-CM

## 2019-06-05 ENCOUNTER — Ambulatory Visit: Payer: Medicare HMO

## 2019-06-05 ENCOUNTER — Other Ambulatory Visit: Payer: Medicare HMO

## 2019-06-05 ENCOUNTER — Inpatient Hospital Stay (HOSPITAL_COMMUNITY): Admission: RE | Admit: 2019-06-05 | Payer: Medicare HMO | Source: Ambulatory Visit

## 2019-06-07 ENCOUNTER — Ambulatory Visit (HOSPITAL_COMMUNITY)
Admission: RE | Admit: 2019-06-07 | Discharge: 2019-06-07 | Disposition: A | Discharge status: Home / Self Care | Payer: Medicare HMO | Source: Ambulatory Visit | Attending: Cardiology | Admitting: Cardiology

## 2019-06-07 ENCOUNTER — Other Ambulatory Visit: Payer: Self-pay

## 2019-06-07 ENCOUNTER — Other Ambulatory Visit: Payer: Medicare HMO | Admitting: *Deleted

## 2019-06-07 ENCOUNTER — Encounter: Payer: Self-pay | Admitting: Pharmacist

## 2019-06-07 ENCOUNTER — Ambulatory Visit (INDEPENDENT_AMBULATORY_CARE_PROVIDER_SITE_OTHER): Payer: Medicare HMO | Admitting: Pharmacist

## 2019-06-07 VITALS — BP 136/80 | HR 70

## 2019-06-07 DIAGNOSIS — M79605 Pain in left leg: Secondary | ICD-10-CM | POA: Diagnosis not present

## 2019-06-07 DIAGNOSIS — I739 Peripheral vascular disease, unspecified: Secondary | ICD-10-CM | POA: Insufficient documentation

## 2019-06-07 DIAGNOSIS — I10 Essential (primary) hypertension: Secondary | ICD-10-CM

## 2019-06-07 DIAGNOSIS — E7800 Pure hypercholesterolemia, unspecified: Secondary | ICD-10-CM

## 2019-06-07 DIAGNOSIS — M79604 Pain in right leg: Secondary | ICD-10-CM | POA: Diagnosis not present

## 2019-06-07 LAB — COMPREHENSIVE METABOLIC PANEL
ALT: 20 IU/L (ref 0–44)
AST: 24 IU/L (ref 0–40)
Albumin/Globulin Ratio: 1.6 (ref 1.2–2.2)
Albumin: 4.4 g/dL (ref 3.8–4.8)
Alkaline Phosphatase: 116 IU/L (ref 48–121)
BUN/Creatinine Ratio: 8 — ABNORMAL LOW (ref 10–24)
BUN: 10 mg/dL (ref 8–27)
Bilirubin Total: 0.4 mg/dL (ref 0.0–1.2)
CO2: 28 mmol/L (ref 20–29)
Calcium: 9.3 mg/dL (ref 8.6–10.2)
Chloride: 101 mmol/L (ref 96–106)
Creatinine, Ser: 1.29 mg/dL — ABNORMAL HIGH (ref 0.76–1.27)
GFR calc Af Amer: 66 mL/min/{1.73_m2} (ref >59)
GFR calc non Af Amer: 57 mL/min/{1.73_m2} — ABNORMAL LOW (ref >59)
Globulin, Total: 2.8 g/dL (ref 1.5–4.5)
Glucose: 98 mg/dL (ref 65–99)
Potassium: 4.6 mmol/L (ref 3.5–5.2)
Sodium: 139 mmol/L (ref 134–144)
Total Protein: 7.2 g/dL (ref 6.0–8.5)

## 2019-06-07 LAB — LIPID PANEL
Chol/HDL Ratio: 3.2 ratio (ref 0.0–5.0)
Cholesterol, Total: 126 mg/dL (ref 100–199)
HDL: 40 mg/dL (ref >39)
LDL Chol Calc (NIH): 67 mg/dL (ref 0–99)
Triglycerides: 101 mg/dL (ref 0–149)
VLDL Cholesterol Cal: 19 mg/dL (ref 5–40)

## 2019-06-07 NOTE — Progress Notes (Signed)
Patient ID: Jacob [name]                 DOB: 11/09/1950                      MRN: [id number]     HPI: Jacob [name] is a 69 y.o. male referred by Dr. Radford Pax to HTN clinic. PMH is significant for multivessel ASCAD s/p CABGands/p PCI of SVG to RI 2008, dyslipidemia and HTN. Patient was seen by Dr. Radford Pax on 05/17/19 via telemedicine. Blood pressure was 154/104. Lisinopril was increased to 62m and referred to HTN clinic.  Patient presents today to HTN clinic. He has already gone to lab for BMP. He endorses that he has increased lisinopril to 111mdaily and is compliant with his medications. Denies dizziness, lightheadedness or swelling. He states he has had a headache since neck surgery, they tell him it will go away.  Only has SOB  with exertion. He does state that he gets off ballance when he starts to walk. If he stands up slowly and holds on to something, it helps. This has been an long ongoing thing.  Patient has not been checking his blood pressure at home. He does have a wrist cuff. Wife does not put salt on his food. Recently started drinking pedialtye before he walks in the yard because he feels like he gets dehydrated.  Current HTN meds: lisinopril 1052maily, metoprolol 12.5mg46mily Previously tried: none BP goal: <130/80  Family History: The patient's family history includes CAD in his brother, brother, brother, father, and sister; Ovarian cancer in his mother. There is no history of Colon cancer, Esophageal cancer, Stomach cancer, Rectal cancer, or Colon polyps.  Social History: never smoked, no ETOH  Diet: doesn't drink much coffee-going to switch to decaf. 5 caffeine free/diet sodas per week- trying to drink more water-pedialyte Breakfast: granola bars Lunch: PB and jelly sandwich Dinner: baked chicken or stir fry, potatoes, carrots, macaroni and cheese, small amount of salad w/ vinegar/oil Wife does not cook with salt Snack: animal cookies, twizlers  Exercise:  gardening, walks to mailbox and back (about 400 yards) walks about an hour around the yard - at least 3 days a week  Home BP readings: none  Wt Readings from Last 3 Encounters:  05/17/19 230 lb (104.3 kg)  03/25/18 237 lb (107.5 kg)  03/25/18 237 lb (107.5 kg)   BP Readings from Last 3 Encounters:  05/17/19 (!) 154/104  03/25/18 108/80  03/25/18 (!) 155/90   Pulse Readings from Last 3 Encounters:  05/17/19 71  03/25/18 81  03/25/18 68    Renal function: CrCl cannot be calculated (Patient's most recent lab result is older than the maximum 21 days allowed.).  Past Medical History:  Diagnosis Date  . CAD (coronary artery disease), native coronary artery     multivessel ASCAD s/p CABG and s/p PCI of SVG to RI 2008  . Carotid artery stenosis    95% right and 50% left s/p right CEA 06/2016 with dopplers 12/2017 with 1-39% stenosis  . Crohn's disease (H[facility])[place] flare 2000  . Depression   . Dyslipidemia   . Dyspnea   . ED (erectile dysfunction)   . History of blood transfusion 2005  . Hypertension   . Myocardial infarction (H[facility])[place]05   5 bypasses  . Nephrolithiasis   . [facility]- [facility]emy R side  . TIA (transient ischemic attack)   .  Ulcerative colitis ([place])    Left sided    Current Outpatient Medications on File Prior to Visit  Medication Sig Dispense Refill  . amitriptyline (ELAVIL) 25 MG tablet Take 1 tablet (25 mg total) by mouth at bedtime. 90 tablet 3  . atorvastatin (LIPITOR) 40 MG tablet TAKE 1 TABLET BY MOUTH ONCE DAILY AT 6PM 90 tablet 2  . clopidogrel (PLAVIX) 75 MG tablet TAKE 1 TABLET BY MOUTH ONCE DAILY WITH BREAKFAST 30 tablet 6  . escitalopram (LEXAPRO) 10 MG tablet Take 10 mg daily by mouth.     Marland Kitchen ibuprofen (ADVIL,MOTRIN) 200 MG tablet Take 200 mg by mouth every 6 (six) hours as needed.    Marland Kitchen lisinopril (ZESTRIL) 10 MG tablet Take 1 tablet (10 mg total) by mouth daily. 90 tablet 3  . metoprolol succinate (TOPROL-XL) 25 MG 24 hr tablet Take  0.5 tablets (12.5 mg total) by mouth daily. Please keep follow up appointment to continue receiving refills. Thank you. 30 tablet 0   Current Facility-Administered Medications on File Prior to Visit  Medication Dose Route Frequency Provider Last Rate Last Admin  . 0.9 %  sodium chloride infusion  500 mL Intravenous Once Armbruster, Carlota Raspberry, MD      . 0.9 %  sodium chloride infusion  500 mL Intravenous Once Cirigliano, Vito V, DO        No Known Allergies  There were no vitals taken for this visit.   Assessment/Plan:  1. Hypertension - Blood pressure is above goal of <130/80 today in clinic. I do not have any home readings to compare to. I have asked patient to start checking blood pressure at home. Proper technique was reviewed. I have encouraged patient to get an upper arm BP cuff as they are more accurate. Also asked him to bring his cuff with him to next appointment. Follow up in clinic in 3 weeks. If BMP stable and BP still high, will plan to increase lisinopril to 55m daily. I have cautioned patient about Pedialyte as it has a lot of Na in it.    Thank you  MRamond Dial Pharm.D, BCPS, CPP [place] [id number] [place] [address] Phone: [phone] Fax: [phone]

## 2019-06-07 NOTE — Patient Instructions (Signed)
It was a pleasure to meet you today!  Please start checking your blood pressure at home. Consider getting an upper arm blood pressure monitor OMRON 3 series or OMRON bronze are good ones that are relatively inexpensive.  Before checking your blood pressure make sure: You are seated and quite for 5 min before checking Feet are flat on the floor Siting in chair with your back supported straight up and down Arm resting on table or arm of chair at heart level Bladder is empty You have NOT had caffeine or tobacco within the last 30 min  Check your blood pressure 2-3 times about 1-2 min apart. Usually the first reading will be the highest. Record these readings.  Only check your blood pressure once a day, unless otherwise directed Record your blood pressure readings and bring them to all your appointments. If your meter stores your readings in its memory, then you may bring your blood pressure meter with you to your appointments.  You can find a list of validated (accurate) blood pressure cuffs at PopPath.it  Lifestyle changes can make a world of difference and are even more important than medications: Try to keep your sodium intake to 2300 mg of sodium per day Get 6-8 uninterrupted hours of sleep per night Aim for a goal of 150 min of moderate aerobic exercise (ie brisk walking, bike riding) per week

## 2019-06-26 NOTE — Progress Notes (Signed)
Patient ID: JAXSIN BOTTOMLEY                 DOB: 08-Jan-1951                      MRN: [id number]     HPI: Jacob [name] is a 69 y.o. male referred by Dr. Radford Pax to HTN clinic. PMH is significant for multivessel ASCAD s/p CABGands/p PCI of SVG to RI 2008, dyslipidemia and HTN. Patient was seen by Dr. Radford Pax on 05/17/19 via telemedicine. Blood pressure was 154/104. Lisinopril was increased to 53m and referred to HTN clinic.  Patient was last seen on 06/07/19 in HTN clinic. BP was 136/80; therefore, no medication changes were made at that visit. Patient had not been checking his blood pressure at home although he had a wrist cuff.  Since last visit, he has purchased a digital blood pressure monitor and has been checking his BP about 3 times per week. He took his BP meds this AM at 7AM. His BP today in clinic was 122/64. We also checked his BP with his home BP monitor which showed 128/78 a few minutes later.   Current HTN meds: lisinopril 170mdaily, metoprolol 12.80m102maily Previously tried: none BP goal: <130/80  Family History: The patient'sfamily history includes CAD in his brother, brother, brother, father, and sister; Ovarian cancer in his mother. There is no history of Colon cancer, Esophageal cancer, Stomach cancer, Rectal cancer, or Colon polyps.  Social History: never smoked, no ETOH  Diet: doesn't drink much coffee but when he does he drinks decaf Drinks Diet Dr. PepMalachi Bondsper day and 8 bottles of water per day Has cut back on pedialyte to reduce sodium intake; only drinks it when he feels extremely dehydrated Breakfast: granola bars Lunch: PB and jelly sandwich Dinner: baked chicken or stir fry, potatoes, carrots, macaroni and cheese, small amount of salad w/ vinegar/oil Wife does not cook with salt Snack: animal cookies, twizlers  Exercise: plays with 6 y7ar old grandson outside every day, walks to mailbox and back (about 250 ft) walks about an hour around the yard - at  least 3 days a week  Home BP readings: 160/98, 134/81, 127/81   Wt Readings from Last 3 Encounters:  05/17/19 230 lb (104.3 kg)  03/25/18 237 lb (107.5 kg)  03/25/18 237 lb (107.5 kg)   BP Readings from Last 3 Encounters:  06/07/19 136/80  05/17/19 (!) 154/104  03/25/18 108/80   Pulse Readings from Last 3 Encounters:  06/07/19 70  05/17/19 71  03/25/18 81    Renal function: CrCl cannot be calculated (Unknown ideal weight.).  Past Medical History:  Diagnosis Date  . CAD (coronary artery disease), native coronary artery     multivessel ASCAD s/p CABG and s/p PCI of SVG to RI 2008  . Carotid artery stenosis    95% right and 50% left s/p right CEA 06/2016 with dopplers 12/2017 with 1-39% stenosis  . [facility]'s disease ([place]  flare 2000  . Depression   . Dyslipidemia   . Dyspnea   . ED (erectile dysfunction)   . History of blood transfusion 2005  . Hypertension   . [facility] infarction ([place]   5 bypasses  . Nephrolithiasis   . [facility]- End[facility]emy R side  . TIA (transient ischemic attack)   . Ul[facility]ative colitis ([place]  Left sided    Current Outpatient Medications on File Prior  to Visit  Medication Sig Dispense Refill  . amitriptyline (ELAVIL) 25 MG tablet Take 1 tablet (25 mg total) by mouth at bedtime. 90 tablet 3  . atorvastatin (LIPITOR) 40 MG tablet TAKE 1 TABLET BY MOUTH ONCE DAILY AT 6PM 90 tablet 2  . clopidogrel (PLAVIX) 75 MG tablet TAKE 1 TABLET BY MOUTH ONCE DAILY WITH BREAKFAST 30 tablet 6  . escitalopram (LEXAPRO) 10 MG tablet Take 10 mg daily by mouth.     Marland Kitchen lisinopril (ZESTRIL) 10 MG tablet Take 1 tablet (10 mg total) by mouth daily. 90 tablet 3  . metoprolol succinate (TOPROL-XL) 25 MG 24 hr tablet Take 0.5 tablets (12.5 mg total) by mouth daily. Please keep follow up appointment to continue receiving refills. Thank you. 30 tablet 0   Current Facility-Administered Medications on File Prior to Visit  Medication Dose Route  Frequency Provider Last Rate Last Admin  . 0.9 %  sodium chloride infusion  500 mL Intravenous Once Armbruster, Carlota Raspberry, MD      . 0.9 %  sodium chloride infusion  500 mL Intravenous Once Cirigliano, Vito V, DO        No Known Allergies   Assessment/Plan:  1. Hypertension - Blood pressure is controlled today at goal of <130/80 mmHg - Continue lisinopril 10 mg daily - Continue metoprolol succinate 12.5 mg daily - F/u with Dr. Radford Pax in 1 year  Kennon Holter, PharmD PGY1 Ambulatory Care Pharmacy Resident

## 2019-06-27 ENCOUNTER — Other Ambulatory Visit: Payer: Self-pay

## 2019-06-27 ENCOUNTER — Ambulatory Visit (INDEPENDENT_AMBULATORY_CARE_PROVIDER_SITE_OTHER): Payer: Medicare HMO | Admitting: Pharmacist

## 2019-06-27 VITALS — BP 122/64 | HR 68

## 2019-06-27 DIAGNOSIS — I10 Essential (primary) hypertension: Secondary | ICD-10-CM

## 2019-06-27 NOTE — Patient Instructions (Addendum)
It was a pleasure seeing you today!  Your blood pressure goal is <130/80.  Your blood pressure in the office was 122/64 which is great.  No medication changes today. Continue taking your lisinopril and metoprolol.   Continue checking your blood pressure about 3 times per week.   Call us if you have any questions [phone].

## 2019-07-18 DIAGNOSIS — R110 Nausea: Secondary | ICD-10-CM | POA: Diagnosis not present

## 2019-07-18 DIAGNOSIS — Z125 Encounter for screening for malignant neoplasm of prostate: Secondary | ICD-10-CM | POA: Diagnosis not present

## 2019-07-18 DIAGNOSIS — R251 Tremor, unspecified: Secondary | ICD-10-CM | POA: Diagnosis not present

## 2019-07-18 DIAGNOSIS — R6889 Other general symptoms and signs: Secondary | ICD-10-CM | POA: Diagnosis not present

## 2019-07-18 DIAGNOSIS — Z6833 Body mass index (BMI) 33.0-33.9, adult: Secondary | ICD-10-CM | POA: Diagnosis not present

## 2019-07-18 DIAGNOSIS — R61 Generalized hyperhidrosis: Secondary | ICD-10-CM | POA: Diagnosis not present

## 2019-09-08 DIAGNOSIS — Z9889 Other specified postprocedural states: Secondary | ICD-10-CM | POA: Diagnosis not present

## 2019-09-08 DIAGNOSIS — M792 Neuralgia and neuritis, unspecified: Secondary | ICD-10-CM | POA: Diagnosis not present

## 2019-09-08 DIAGNOSIS — Z6832 Body mass index (BMI) 32.0-32.9, adult: Secondary | ICD-10-CM | POA: Diagnosis not present

## 2019-09-08 DIAGNOSIS — R69 Illness, unspecified: Secondary | ICD-10-CM | POA: Diagnosis not present

## 2019-09-08 DIAGNOSIS — I2510 Atherosclerotic heart disease of native coronary artery without angina pectoris: Secondary | ICD-10-CM | POA: Diagnosis not present

## 2019-09-08 DIAGNOSIS — N529 Male erectile dysfunction, unspecified: Secondary | ICD-10-CM | POA: Diagnosis not present

## 2019-09-21 DIAGNOSIS — H11001 Unspecified pterygium of right eye: Secondary | ICD-10-CM | POA: Diagnosis not present

## 2019-09-21 DIAGNOSIS — H18513 Endothelial corneal dystrophy, bilateral: Secondary | ICD-10-CM | POA: Diagnosis not present

## 2019-09-21 DIAGNOSIS — H524 Presbyopia: Secondary | ICD-10-CM | POA: Diagnosis not present

## 2019-09-21 DIAGNOSIS — Z961 Presence of intraocular lens: Secondary | ICD-10-CM | POA: Diagnosis not present

## 2019-09-22 ENCOUNTER — Other Ambulatory Visit: Payer: Self-pay | Admitting: Cardiology

## 2020-01-01 DIAGNOSIS — Z9181 History of falling: Secondary | ICD-10-CM | POA: Diagnosis not present

## 2020-01-01 DIAGNOSIS — Z0000 Encounter for general adult medical examination without abnormal findings: Secondary | ICD-10-CM | POA: Diagnosis not present

## 2020-01-01 DIAGNOSIS — E785 Hyperlipidemia, unspecified: Secondary | ICD-10-CM | POA: Diagnosis not present

## 2020-01-01 DIAGNOSIS — Z23 Encounter for immunization: Secondary | ICD-10-CM | POA: Diagnosis not present

## 2020-01-01 DIAGNOSIS — E669 Obesity, unspecified: Secondary | ICD-10-CM | POA: Diagnosis not present

## 2020-01-01 DIAGNOSIS — Z1331 Encounter for screening for depression: Secondary | ICD-10-CM | POA: Diagnosis not present

## 2020-01-01 DIAGNOSIS — Z139 Encounter for screening, unspecified: Secondary | ICD-10-CM | POA: Diagnosis not present

## 2020-01-03 ENCOUNTER — Ambulatory Visit (HOSPITAL_COMMUNITY)
Admission: RE | Admit: 2020-01-03 | Payer: Medicare HMO | Source: Ambulatory Visit | Attending: Cardiology | Admitting: Cardiology

## 2020-01-17 ENCOUNTER — Other Ambulatory Visit: Payer: Self-pay

## 2020-01-17 ENCOUNTER — Ambulatory Visit (HOSPITAL_COMMUNITY)
Admission: RE | Admit: 2020-01-17 | Discharge: 2020-01-17 | Disposition: A | Discharge status: Home / Self Care | Payer: Medicare HMO | Source: Ambulatory Visit | Attending: Cardiovascular Disease | Admitting: Cardiovascular Disease

## 2020-01-17 ENCOUNTER — Other Ambulatory Visit (HOSPITAL_COMMUNITY): Payer: Self-pay | Admitting: Cardiology

## 2020-01-17 DIAGNOSIS — I6523 Occlusion and stenosis of bilateral carotid arteries: Secondary | ICD-10-CM | POA: Diagnosis not present

## 2020-01-17 DIAGNOSIS — Z9889 Other specified postprocedural states: Secondary | ICD-10-CM

## 2020-01-17 DIAGNOSIS — I771 Stricture of artery: Secondary | ICD-10-CM

## 2020-01-18 ENCOUNTER — Encounter: Payer: Self-pay | Admitting: Cardiology

## 2020-01-26 DIAGNOSIS — K648 Other hemorrhoids: Secondary | ICD-10-CM | POA: Diagnosis not present

## 2020-01-26 DIAGNOSIS — Z6830 Body mass index (BMI) 30.0-30.9, adult: Secondary | ICD-10-CM | POA: Diagnosis not present

## 2020-01-26 DIAGNOSIS — K921 Melena: Secondary | ICD-10-CM | POA: Diagnosis not present

## 2020-01-27 ENCOUNTER — Other Ambulatory Visit: Payer: Self-pay | Admitting: Cardiology

## 2020-03-15 DIAGNOSIS — M792 Neuralgia and neuritis, unspecified: Secondary | ICD-10-CM | POA: Diagnosis not present

## 2020-03-15 DIAGNOSIS — N529 Male erectile dysfunction, unspecified: Secondary | ICD-10-CM | POA: Diagnosis not present

## 2020-03-15 DIAGNOSIS — Z6829 Body mass index (BMI) 29.0-29.9, adult: Secondary | ICD-10-CM | POA: Diagnosis not present

## 2020-03-15 DIAGNOSIS — Z9889 Other specified postprocedural states: Secondary | ICD-10-CM | POA: Diagnosis not present

## 2020-03-15 DIAGNOSIS — Z23 Encounter for immunization: Secondary | ICD-10-CM | POA: Diagnosis not present

## 2020-03-15 DIAGNOSIS — I2510 Atherosclerotic heart disease of native coronary artery without angina pectoris: Secondary | ICD-10-CM | POA: Diagnosis not present

## 2020-03-15 DIAGNOSIS — R69 Illness, unspecified: Secondary | ICD-10-CM | POA: Diagnosis not present

## 2020-03-19 IMAGING — US US PELVIS LIMITED
1 series · 14 of 25 positions shown · non-contrast
Comparison: None.

CLINICAL DATA: 67-year-old with acute onset of LEFT scrotal pain in
early November 2017, shown on scrotal ultrasound at that time to
have a LEFT varicocele. Pain is also present in LEFT LOWER QUADRANT
and LEFT inguinal region.

EXAM:
LIMITED ULTRASOUND OF PELVIS
TECHNIQUE: Limited transabdominal ultrasound examination of the pelvis was
performed in the area of clinical concern.

[Series 1: us pelvis limited · 0.13mm/px · 14 of 33 slices shown]
[im 1/33]
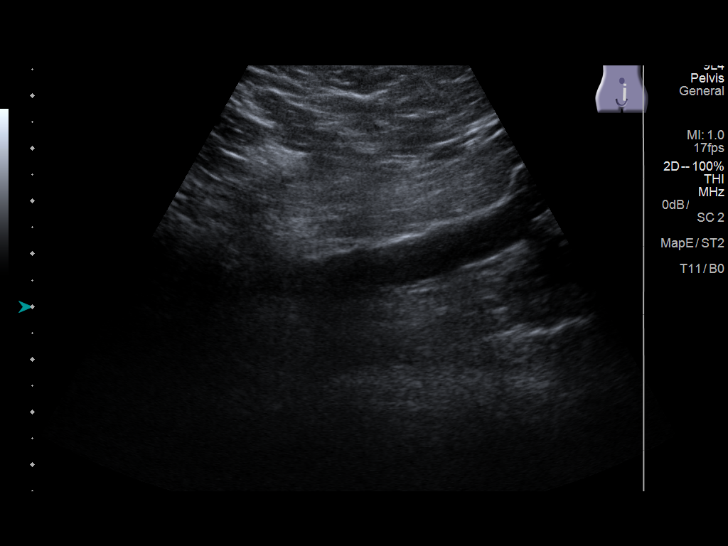
[im 3/33]
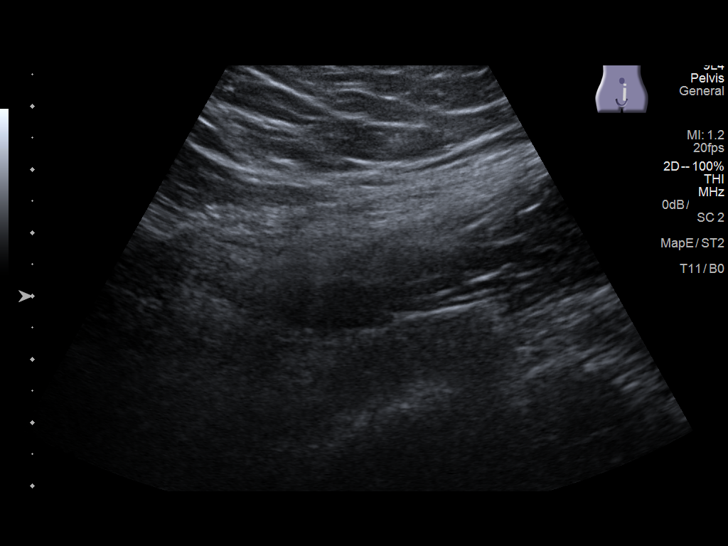
[im 6/33]
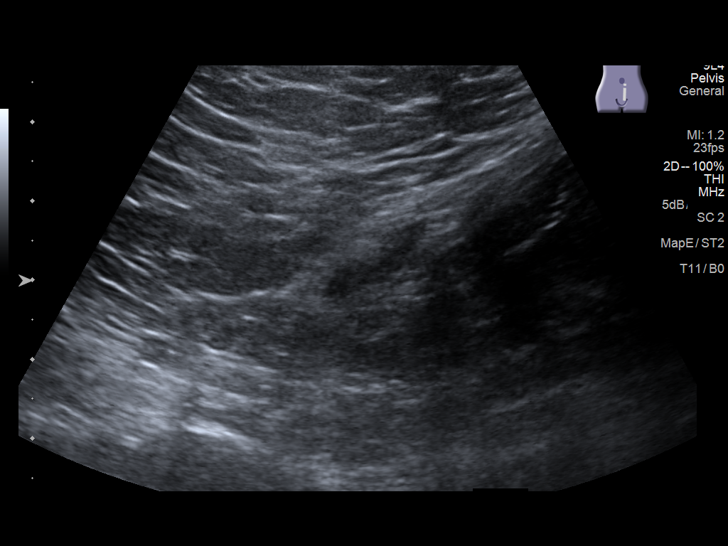
[im 9/33]
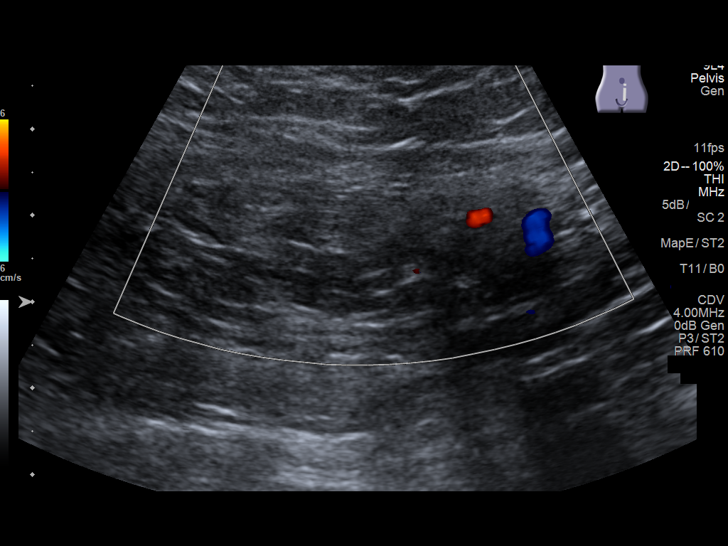
[im 11/33]
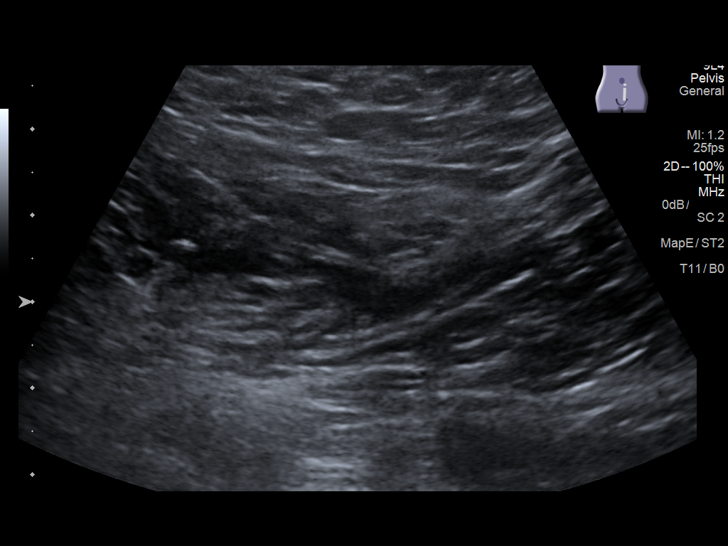
[im 13/33]
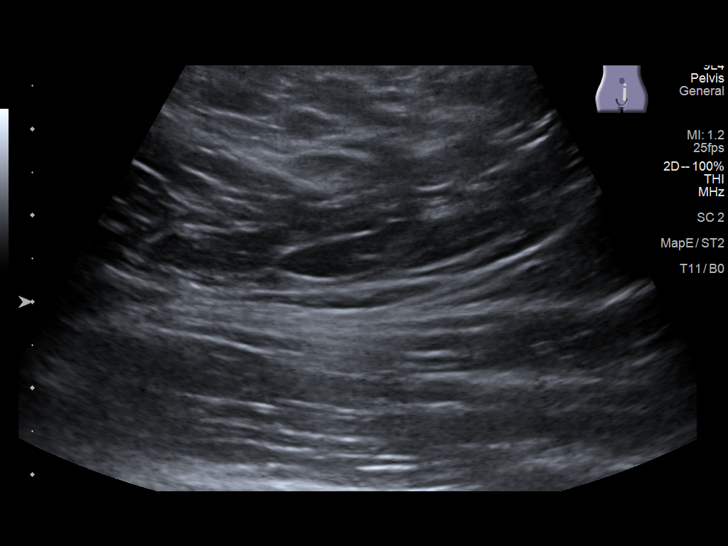
[im 15/33]
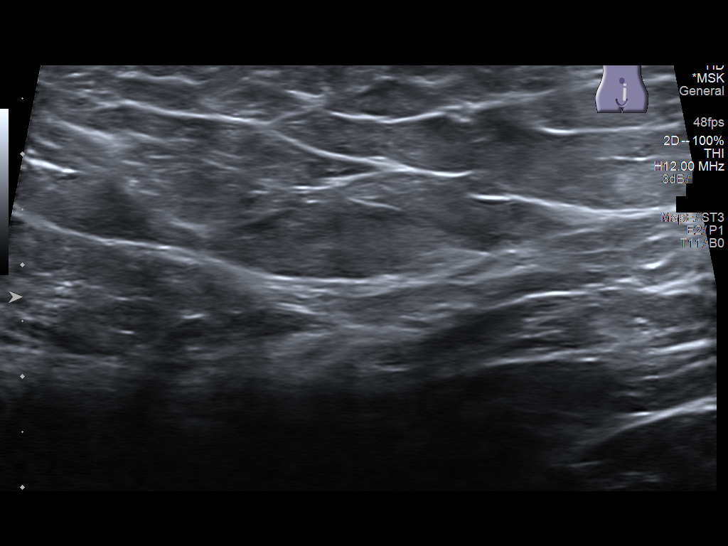
[im 18/33]
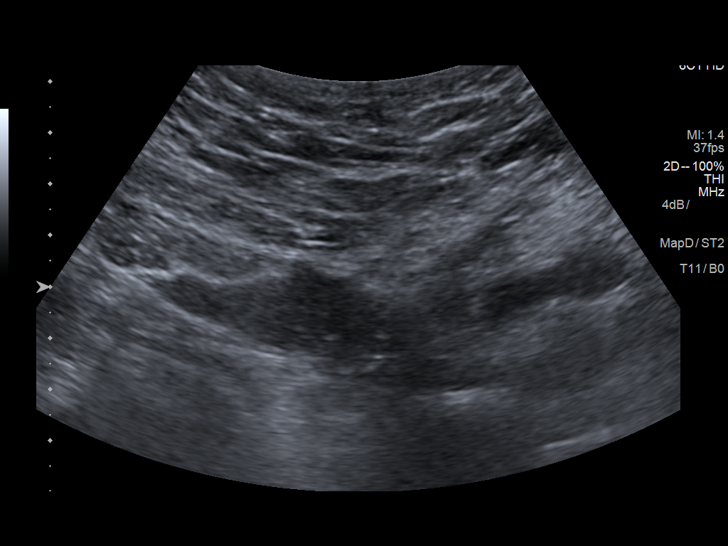
[im 21/33]
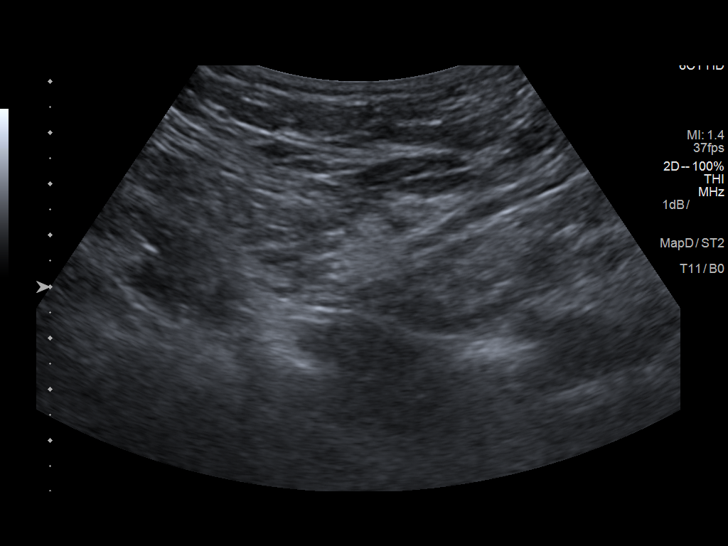
[im 22/33]
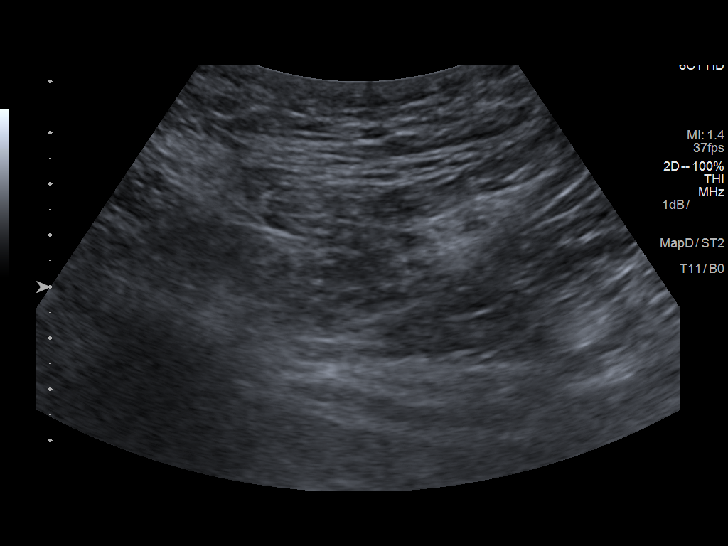
[im 25/33]
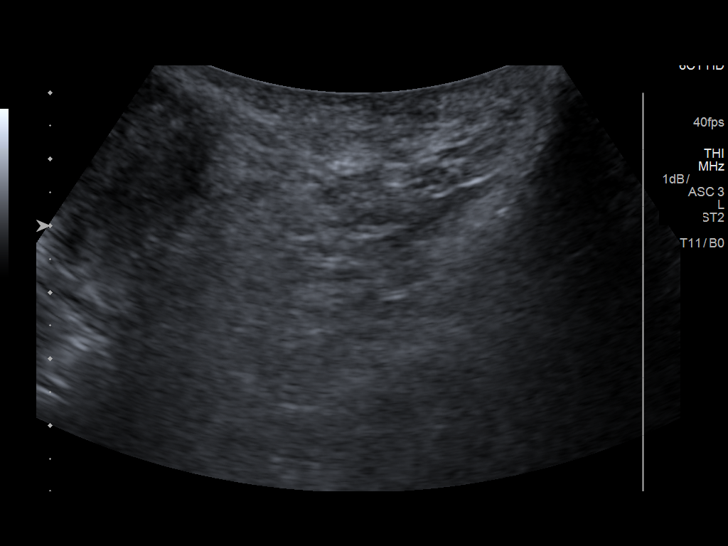
[im 27/33]
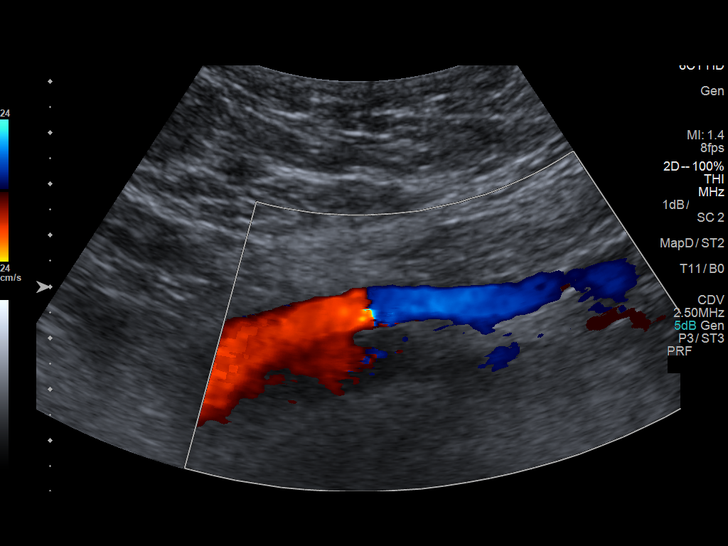
[im 30/33]
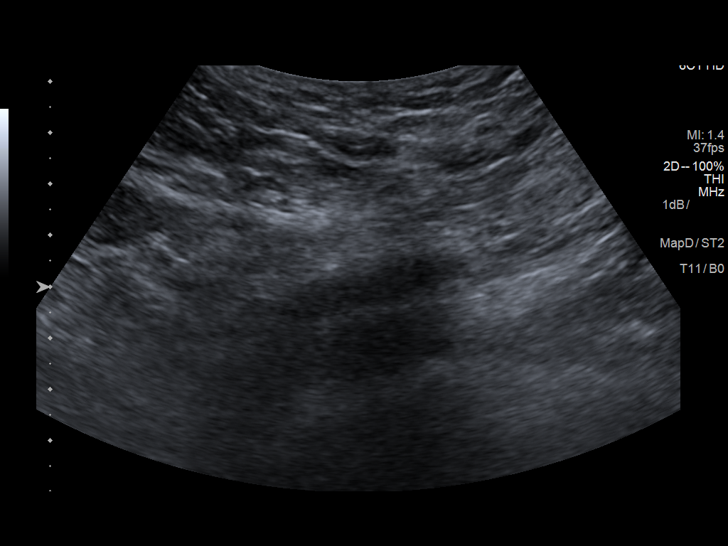
[im 33/33]
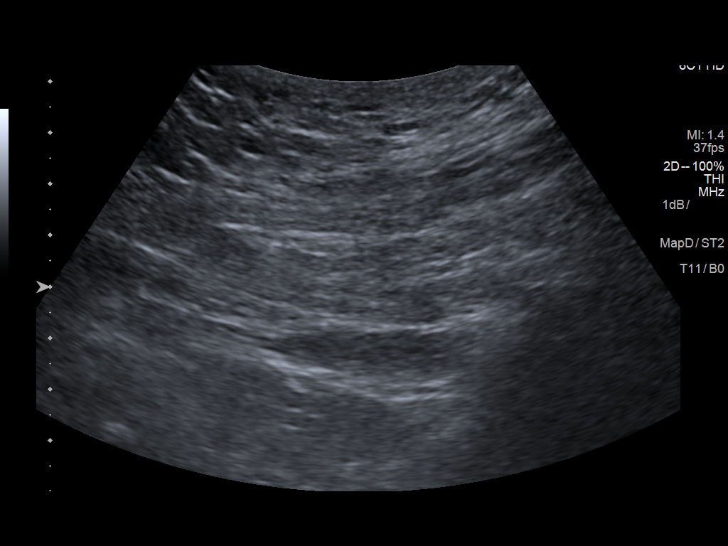

[14 of 25 positions shown; findings below may reference images not displayed]

FINDINGS: No mass or pathologic lymphadenopathy in the visualized pelvis. No
visible LEFT inguinal hernia.
IMPRESSION: Normal examination.

## 2020-05-07 ENCOUNTER — Other Ambulatory Visit: Payer: Self-pay | Admitting: Cardiology

## 2020-05-27 DIAGNOSIS — R2689 Other abnormalities of gait and mobility: Secondary | ICD-10-CM | POA: Diagnosis not present

## 2020-05-27 DIAGNOSIS — Z125 Encounter for screening for malignant neoplasm of prostate: Secondary | ICD-10-CM | POA: Diagnosis not present

## 2020-05-27 DIAGNOSIS — I10 Essential (primary) hypertension: Secondary | ICD-10-CM | POA: Diagnosis not present

## 2020-05-27 DIAGNOSIS — E785 Hyperlipidemia, unspecified: Secondary | ICD-10-CM | POA: Diagnosis not present

## 2020-05-27 DIAGNOSIS — G629 Polyneuropathy, unspecified: Secondary | ICD-10-CM | POA: Diagnosis not present

## 2020-05-27 DIAGNOSIS — R519 Headache, unspecified: Secondary | ICD-10-CM | POA: Diagnosis not present

## 2020-05-27 DIAGNOSIS — Z6829 Body mass index (BMI) 29.0-29.9, adult: Secondary | ICD-10-CM | POA: Diagnosis not present

## 2020-05-27 DIAGNOSIS — I2510 Atherosclerotic heart disease of native coronary artery without angina pectoris: Secondary | ICD-10-CM | POA: Diagnosis not present

## 2020-05-27 DIAGNOSIS — Z9889 Other specified postprocedural states: Secondary | ICD-10-CM | POA: Diagnosis not present

## 2020-05-28 DIAGNOSIS — E538 Deficiency of other specified B group vitamins: Secondary | ICD-10-CM | POA: Diagnosis not present

## 2020-06-04 DIAGNOSIS — E538 Deficiency of other specified B group vitamins: Secondary | ICD-10-CM | POA: Diagnosis not present

## 2020-06-05 ENCOUNTER — Encounter: Payer: Self-pay | Admitting: Neurology

## 2020-06-11 DIAGNOSIS — E538 Deficiency of other specified B group vitamins: Secondary | ICD-10-CM | POA: Diagnosis not present

## 2020-06-18 DIAGNOSIS — E538 Deficiency of other specified B group vitamins: Secondary | ICD-10-CM | POA: Diagnosis not present

## 2020-06-26 DIAGNOSIS — E538 Deficiency of other specified B group vitamins: Secondary | ICD-10-CM | POA: Diagnosis not present

## 2020-06-26 DIAGNOSIS — D1030 Benign neoplasm of unspecified part of mouth: Secondary | ICD-10-CM | POA: Diagnosis not present

## 2020-06-26 DIAGNOSIS — G629 Polyneuropathy, unspecified: Secondary | ICD-10-CM | POA: Diagnosis not present

## 2020-06-26 DIAGNOSIS — I2510 Atherosclerotic heart disease of native coronary artery without angina pectoris: Secondary | ICD-10-CM | POA: Diagnosis not present

## 2020-06-26 DIAGNOSIS — R2689 Other abnormalities of gait and mobility: Secondary | ICD-10-CM | POA: Diagnosis not present

## 2020-06-26 DIAGNOSIS — Z6829 Body mass index (BMI) 29.0-29.9, adult: Secondary | ICD-10-CM | POA: Diagnosis not present

## 2020-06-28 ENCOUNTER — Ambulatory Visit: Payer: Medicare HMO | Admitting: Cardiology

## 2020-06-28 ENCOUNTER — Other Ambulatory Visit: Payer: Self-pay

## 2020-06-28 ENCOUNTER — Encounter: Payer: Self-pay | Admitting: Cardiology

## 2020-06-28 VITALS — BP 125/78 | HR 53 | Ht 72.0 in | Wt 206.0 lb

## 2020-06-28 DIAGNOSIS — E7800 Pure hypercholesterolemia, unspecified: Secondary | ICD-10-CM | POA: Diagnosis not present

## 2020-06-28 DIAGNOSIS — I6523 Occlusion and stenosis of bilateral carotid arteries: Secondary | ICD-10-CM

## 2020-06-28 DIAGNOSIS — R001 Bradycardia, unspecified: Secondary | ICD-10-CM | POA: Diagnosis not present

## 2020-06-28 DIAGNOSIS — I2510 Atherosclerotic heart disease of native coronary artery without angina pectoris: Secondary | ICD-10-CM | POA: Diagnosis not present

## 2020-06-28 DIAGNOSIS — I10 Essential (primary) hypertension: Secondary | ICD-10-CM

## 2020-06-28 MED ORDER — LISINOPRIL 10 MG PO TABS: 10.0000 mg | ORAL_TABLET | Freq: Every day | ORAL | 3 refills | Status: DC

## 2020-06-28 MED ORDER — ATORVASTATIN CALCIUM 40 MG PO TABS: 40.0000 mg | ORAL_TABLET | Freq: Every day | ORAL | 3 refills | Status: DC

## 2020-06-28 MED ORDER — METOPROLOL SUCCINATE ER 25 MG PO TB24: ORAL_TABLET | ORAL | 3 refills | Status: DC

## 2020-06-28 NOTE — Patient Instructions (Signed)

## 2020-06-28 NOTE — Progress Notes (Signed)
Date:  06/28/2020   ID:  Jacob [name], DOB 09-14-1950, MRN [id number]   PCP:  Philmore Pali, NP  Cardiologist:  Fransico Him, MD  Electrophysiologist:  None   Chief Complaint:  CAD, HTN, HLD  History of Present Illness:    Jacob [name] is a 70 y.o. male with a hx of multivessel ASCAD s/p CABG and s/p PCI of SVG to RI 2008, dyslipidemia and HTN. He is here today for followup and is doing well.  He denies any anginal chest pain or pressure, SOB, DOE, PND, orthopnea, LE edema, palpitations or syncope. He was having some dizziness and found out he was Vit B12 def and this has improved with B12 injections.  He is compliant with his meds and is tolerating meds with no SE.     Prior CV studies:   The following studies were reviewed today:  EKG  Past Medical History:  Diagnosis Date   CAD (coronary artery disease), native coronary artery     multivessel ASCAD s/p CABG and s/p PCI of SVG to RI 2008   Carotid artery stenosis    s/p Left CEA in 2018.  Stable 1-39% bilateral carotid stenosis by dopplers 12/2019   Crohn's disease ([place])    flare 2000   Depression    Dyslipidemia    Dyspnea    ED (erectile dysfunction)    History of blood transfusion 2005   Hypertension    Myocardial infarction [facility]) 2005   5 bypasses   Nephrolithiasis    Stroke [facility])    TIA- Endartarectemy R side   TIA (transient ischemic attack)    Ulcerative colitis ([place])    Left sided   Past Surgical History:  Procedure Laterality Date   CARDIAC CATHETERIZATION  06/08   Patnet grafts LAD, marginal, distal right   carpel tunnel surgery Left    COLONOSCOPY     COLONOSCOPY WITH PROPOFOL N/A 10/25/2012   Procedure: COLONOSCOPY WITH PROPOFOL;  Surgeon: Garlan Fair, MD;  Location: [facility];  Service: Endoscopy;  Laterality: N/A;   CORONARY ARTERY BYPASS GRAFT  2005   ASCVD,multivessel,S/P CABG  x5 total per patient   ENDARTERECTOMY Right 06/24/2016   Procedure: ENDARTERECTOMY CAROTID;  Surgeon:  Algernon Huxley, MD;  Location: [facility] ORS;  Service: Vascular;  Laterality: Right;   HERNIA REPAIR  many years ago   KNEE ARTHROSCOPY Right 1995     Current Meds  Medication Sig   amitriptyline (ELAVIL) 25 MG tablet Take 1 tablet (25 mg total) by mouth at bedtime.   atorvastatin (LIPITOR) 40 MG tablet TAKE 1 TABLET BY MOUTH ONCE DAILY AT  6PM   escitalopram (LEXAPRO) 10 MG tablet Take 10 mg daily by mouth.    lisinopril (ZESTRIL) 10 MG tablet Take 1 tablet (10 mg total) by mouth daily.   metoprolol succinate (TOPROL-XL) 25 MG 24 hr tablet Take 1/2 (one-half) tablet by mouth once daily   vitamin B-12 (CYANOCOBALAMIN) 500 MCG tablet Take 500 mcg by mouth daily.   Current Facility-Administered Medications for the 06/28/20 encounter (Office Visit) with Sueanne Margarita, MD  Medication   0.9 %  sodium chloride infusion   0.9 %  sodium chloride infusion     Allergies:   Patient has no known allergies.   Social History   Tobacco Use   Smoking status: Never   Smokeless tobacco: Current    Types: Snuff  Vaping Use   Vaping Use: Never used  Substance Use Topics  Alcohol use: No   Drug use: No     Family Hx: The patient's family history includes CAD in his brother, brother, brother, father, and sister; Ovarian cancer in his mother. There is no history of Colon cancer, Esophageal cancer, Stomach cancer, Rectal cancer, or Colon polyps.  ROS:   Please see the history of present illness.     All other systems reviewed and are negative.   Labs/Other Tests and Data Reviewed:    Recent Labs: No results found for requested labs within last 8760 hours.   Recent Lipid Panel Lab Results  Component Value Date/Time   CHOL 126 06/07/2019 09:25 AM   TRIG 101 06/07/2019 09:25 AM   HDL 40 06/07/2019 09:25 AM   CHOLHDL 3.2 06/07/2019 09:25 AM   CHOLHDL 3.3 06/17/2016 05:23 AM   LDLCALC 67 06/07/2019 09:25 AM   LDLDIRECT 71 01/24/2013 09:14 AM    Wt Readings from Last 3 Encounters:   06/28/20 206 lb (93.4 kg)  05/17/19 230 lb (104.3 kg)  03/25/18 237 lb (107.5 kg)     Objective:    Vital Signs:  BP 125/78   Pulse (!) 53   Ht 6' (1.829 m)   Wt 206 lb (93.4 kg)   SpO2 99%   BMI 27.94 kg/m   GEN: Well nourished, well developed in no acute distress HEENT: Normal NECK: No JVD; No carotid bruits LYMPHATICS: No lymphadenopathy CARDIAC:RRR, no murmurs, rubs, gallops RESPIRATORY:  Clear to auscultation without rales, wheezing or rhonchi  ABDOMEN: Soft, non-tender, non-distended MUSCULOSKELETAL:  No edema; No deformity  SKIN: Warm and dry NEUROLOGIC:  Alert and oriented x 3 PSYCHIATRIC:  Normal affect   EKG was performed in the office today and showed Sinus bradycardia at 53bpm with LVH by voltage and repol abnormality ASSESSMENT & PLAN:    1.  ASCAD  -s/p multivessel ASCAD s/p CABG and s/p PCI of SVG to RI 2008.   -he has not had any anginal symptoms -Continue prescription drug management with ASA 46m daily, Toprol XL 12.536mdaily and  Atorvastatin 4090maily>>refilled for 1 year   2.  HTN  -BP controlled on exam today -Continue prescription drug management with Toprol XL 12.5mg52mily and Lisinopril 10mg66mly>refilled for 1 year  -encouraged him to follow a low Na diet -I have personally reviewed and interpreted outside labs performed by patient's PCP which showed SCr 1.1, K+ 4.8 and TSH 1.54 in May 2022   3.  Carotid artery stenosis -CT Angio of the neck 06/16/2016 showed severe near occlusive stenosis of the right ICA and 40 to 50% plaque in the proximal left ICA as well as atheromatous plaque in the left vertebral artery with moderate to severe stenosis.  -Status post right CEA 06/2016.  -dopplers 12/2019 showed 1-39% bilateral stenosis -continue ASA and statin   4.  Bradycardia  -drug induced from low dose BB for CAD -asymptomatic  5.  Hyperlipidemia  -LDL goal is less than 70.    -I have personally reviewed and interpreted outside labs performed  by patient's PCP which showed LDL 56, TAGs 127, HDL 37 and ALT 21 in Feb and May 2022  -Continue prescription drug management with Atorvastatin 40mg 13my>>refilled for 1 year    Medication Adjustments/Labs and Tests Ordered: Current medicines are reviewed at length with the patient today.  Concerns regarding medicines are outlined above.  Tests Ordered: Orders Placed This Encounter  Procedures   EKG 12-Lead    Medication Changes: No orders of the  defined types were placed in this encounter.   Disposition:  Follow up in 1 year(s)  Signed, Fransico Him, MD  06/28/2020 8:46 AM    [facility]

## 2020-06-28 NOTE — Addendum Note (Signed)
Addended by: Antonieta Iba on: 06/28/2020 08:55 AM   Modules accepted: Orders

## 2020-07-24 DIAGNOSIS — E538 Deficiency of other specified B group vitamins: Secondary | ICD-10-CM | POA: Diagnosis not present

## 2020-08-05 ENCOUNTER — Encounter: Payer: Self-pay | Admitting: Neurology

## 2020-08-05 ENCOUNTER — Other Ambulatory Visit: Payer: Self-pay

## 2020-08-05 ENCOUNTER — Ambulatory Visit (INDEPENDENT_AMBULATORY_CARE_PROVIDER_SITE_OTHER): Payer: Medicare HMO | Admitting: Neurology

## 2020-08-05 VITALS — BP 154/84 | HR 54 | Ht 72.0 in | Wt 205.0 lb

## 2020-08-05 DIAGNOSIS — G63 Polyneuropathy in diseases classified elsewhere: Secondary | ICD-10-CM | POA: Diagnosis not present

## 2020-08-05 DIAGNOSIS — E538 Deficiency of other specified B group vitamins: Secondary | ICD-10-CM | POA: Diagnosis not present

## 2020-08-05 NOTE — Progress Notes (Signed)
[facility] Clinic Note - Initial Visit   Date: 08/05/20  Jacob [name] MRN: [id number] DOB: March 22, 1950   Dear Charlott Holler, NP:  Thank you for your kind referral of Jacob [name] for consultation of feet numbness. Although his history is well known to you, please allow [place] to reiterate it for the purpose of our medical record. The patient was accompanied to the clinic by self.   History of Present Illness: Jacob [name] is a 70 y.o. right-handed male with CAD s/p CABG, R CEA, TIA , Crohn's disease, hypertension, hyperlipidemia,  presenting for evaluation of imbalance and numbness of the feet. Starting around April 2022, he began having imbalance and numbness/tingling involving the toes and balls of the feet. In May, he saw his PCP for these symptoms and was found to have vitamin B12 deficiency (211) and once he started supplemention, his balance has improved. However, the numbness in his feet is unchanged.  Numbness is constant and involves the toes and balls of the feet.  He denies pain or weakness.  Repeat vitamin B12 is normal, per patient.   He has been on disability for heart disease (2005). He lives with wife, 78 year old, and 67 year-old grandson.   Past Medical History:  Diagnosis Date   CAD (coronary artery disease), native coronary artery     multivessel ASCAD s/p CABG and s/p PCI of SVG to RI 2008   Carotid artery stenosis    s/p Left CEA in 2018.  Stable 1-39% bilateral carotid stenosis by dopplers 12/2019   Crohn's disease ([place])    flare 2000   Depression    Dyslipidemia    Dyspnea    ED (erectile dysfunction)    History of blood transfusion 2005   Hypertension    Myocardial infarction [facility]) 2005   5 bypasses   Nephrolithiasis    Stroke [facility])    TIA- Endartarectemy R side   TIA (transient ischemic attack)    Ulcerative colitis ([place])    Left sided    Past Surgical History:  Procedure Laterality Date   CARDIAC  CATHETERIZATION  06/08   Patnet grafts LAD, marginal, distal right   carpel tunnel surgery Left    COLONOSCOPY     COLONOSCOPY WITH PROPOFOL N/A 10/25/2012   Procedure: COLONOSCOPY WITH PROPOFOL;  Surgeon: Garlan Fair, MD;  Location: [facility];  Service: Endoscopy;  Laterality: N/A;   CORONARY ARTERY BYPASS GRAFT  2005   ASCVD,multivessel,S/P CABG  x5 total per patient   ENDARTERECTOMY Right 06/24/2016   Procedure: ENDARTERECTOMY CAROTID;  Surgeon: Algernon Huxley, MD;  Location: [facility] ORS;  Service: Vascular;  Laterality: Right;   HERNIA REPAIR  many years ago   KNEE ARTHROSCOPY Right 1995     Medications:  Outpatient Encounter Medications as of 08/05/2020  Medication Sig   aspirin EC 81 MG tablet Take 81 mg by mouth daily. Swallow whole.   atorvastatin (LIPITOR) 40 MG tablet Take 1 tablet (40 mg total) by mouth daily.   lisinopril (ZESTRIL) 10 MG tablet Take 1 tablet (10 mg total) by mouth daily.   metoprolol succinate (TOPROL-XL) 25 MG 24 hr tablet Take 1/2 (one-half) tablet by mouth once daily   vitamin B-12 (CYANOCOBALAMIN) 500 MCG tablet Take 500 mcg by mouth daily.   amitriptyline (ELAVIL) 25 MG tablet Take 1 tablet (25 mg total) by mouth at bedtime. (Patient not taking: Reported on 08/05/2020)   escitalopram (LEXAPRO) 10 MG tablet Take 10 mg daily by  mouth.  (Patient not taking: Reported on 08/05/2020)   Facility-Administered Encounter Medications as of 08/05/2020  Medication   0.9 %  sodium chloride infusion   0.9 %  sodium chloride infusion    Allergies: No Known Allergies  Family History: Family History  Problem Relation Age of Onset   Ovarian cancer Mother    CAD Father    CAD Sister    CAD Brother    CAD Brother    CAD Brother    Colon cancer Neg Hx    Esophageal cancer Neg Hx    Stomach cancer Neg Hx    Rectal cancer Neg Hx    Colon polyps Neg Hx     Social History: Social History   Tobacco Use   Smoking status: Never   Smokeless tobacco: Current     Types: Snuff  Vaping Use   Vaping Use: Never used  Substance Use Topics   Alcohol use: No   Drug use: No   Social History   Social History Narrative   Not on file    Vital Signs:  BP (!) 154/84   Pulse (!) 54   Ht 6' (1.829 m)   Wt 205 lb (93 kg)   SpO2 100%   BMI 27.80 kg/m     Neurological Exam: MENTAL STATUS including orientation to time, place, person, recent and remote memory, attention span and concentration, language, and fund of knowledge is normal.  Speech is not dysarthric.  CRANIAL NERVES: II:  No visual field defects.    III-IV-VI: Pupils equal round and reactive to light.  Normal conjugate, extra-ocular eye movements in all directions of gaze.  No nystagmus.  No ptosis.   V:  Normal facial sensation.    VII:  Normal facial symmetry and movements.   VIII:  Normal hearing and vestibular function.   IX-X:  Normal palatal movement.   XI:  Normal shoulder shrug and head rotation.   XII:  Normal tongue strength and range of motion, no deviation or fasciculation.  MOTOR:  No atrophy, fasciculations or abnormal movements.  No pronator drift.   Upper Extremity:  Right  Left  Deltoid  5/5   5/5   Biceps  5/5   5/5   Triceps  5/5   5/5   Infraspinatus 5/5  5/5  Medial pectoralis 5/5  5/5  Wrist extensors  5/5   5/5   Wrist flexors  5/5   5/5   Finger extensors  5/5   5/5   Finger flexors  5/5   5/5   Dorsal interossei  5/5   5/5   Abductor pollicis  5/5   5/5   Tone (Ashworth scale)  0  0   Lower Extremity:  Right  Left  Hip flexors  5/5   5/5   Hip extensors  5/5   5/5   Adductor 5/5  5/5  Abductor 5/5  5/5  Knee flexors  5/5   5/5   Knee extensors  5/5   5/5   Dorsiflexors  5/5   5/5   Plantarflexors  5/5   5/5   Toe extensors  5/5   5/5   Toe flexors  5/5   5/5   Tone (Ashworth scale)  0  0   MSRs:  Right        Left                  brachioradialis 2+  2+  biceps 2+  2+  triceps 2+  2+  patellar 2+  2+  ankle jerk 0  0  Hoffman no  no   plantar response down  down   SENSORY:  Absent vibration at the great toe bilaterally, reduced temperature and pin prick over the toes. Rhomberg sign is absent.  COORDINATION/GAIT: Normal finger-to- nose-finger.  Intact rapid alternating movements bilaterally.  Gait narrow based and stable. Stressed gait intact, mild unsteadiness with tandem gait but able to perform.    IMPRESSION Bilateral feet paresthesias due to neuropathy secondary to vitamin B12 deficiency.  Imbalance has improved with supplementation, however numbness is unchanged.  ABI normal in 2021.   - NCS/EMG offered, he would like to wait to see how symptoms evolve over the next few months  - Explained that sensory symptoms may take months to improve, despite vitamin B12 levels normalizing again  - Continue vitamin B12 1067mg daily  Return to clinic in 4 months.   Thank you for allowing me to participate in patient's care.  If I can answer any additional questions, I would be pleased to do so.    Sincerely,    Wilber Fini K. PPosey Pronto DO

## 2020-08-05 NOTE — Patient Instructions (Signed)
Continue vitamin B12 1076mg daily  Return to clinic in 4 months

## 2020-09-12 DIAGNOSIS — R69 Illness, unspecified: Secondary | ICD-10-CM | POA: Diagnosis not present

## 2020-09-12 DIAGNOSIS — Z6828 Body mass index (BMI) 28.0-28.9, adult: Secondary | ICD-10-CM | POA: Diagnosis not present

## 2020-09-12 DIAGNOSIS — N529 Male erectile dysfunction, unspecified: Secondary | ICD-10-CM | POA: Diagnosis not present

## 2020-09-12 DIAGNOSIS — I2510 Atherosclerotic heart disease of native coronary artery without angina pectoris: Secondary | ICD-10-CM | POA: Diagnosis not present

## 2020-09-12 DIAGNOSIS — E538 Deficiency of other specified B group vitamins: Secondary | ICD-10-CM | POA: Diagnosis not present

## 2020-09-12 DIAGNOSIS — M792 Neuralgia and neuritis, unspecified: Secondary | ICD-10-CM | POA: Diagnosis not present

## 2020-09-12 DIAGNOSIS — Z9889 Other specified postprocedural states: Secondary | ICD-10-CM | POA: Diagnosis not present

## 2020-10-23 ENCOUNTER — Encounter: Payer: Self-pay | Admitting: Gastroenterology

## 2020-10-26 ENCOUNTER — Emergency Department: Payer: Medicare HMO

## 2020-10-26 ENCOUNTER — Other Ambulatory Visit: Payer: Self-pay

## 2020-10-26 ENCOUNTER — Emergency Department
Admission: EM | Admit: 2020-10-26 | Discharge: 2020-10-26 | Disposition: A | Discharge status: Home / Self Care | Payer: Medicare HMO | Attending: Emergency Medicine | Admitting: Emergency Medicine

## 2020-10-26 ENCOUNTER — Encounter: Payer: Self-pay | Admitting: Emergency Medicine

## 2020-10-26 DIAGNOSIS — Z7982 Long term (current) use of aspirin: Secondary | ICD-10-CM | POA: Insufficient documentation

## 2020-10-26 DIAGNOSIS — Z79899 Other long term (current) drug therapy: Secondary | ICD-10-CM | POA: Diagnosis not present

## 2020-10-26 DIAGNOSIS — S63502A Unspecified sprain of left wrist, initial encounter: Secondary | ICD-10-CM | POA: Insufficient documentation

## 2020-10-26 DIAGNOSIS — M47812 Spondylosis without myelopathy or radiculopathy, cervical region: Secondary | ICD-10-CM | POA: Diagnosis not present

## 2020-10-26 DIAGNOSIS — S59912A Unspecified injury of left forearm, initial encounter: Secondary | ICD-10-CM | POA: Diagnosis not present

## 2020-10-26 DIAGNOSIS — I10 Essential (primary) hypertension: Secondary | ICD-10-CM | POA: Insufficient documentation

## 2020-10-26 DIAGNOSIS — I2510 Atherosclerotic heart disease of native coronary artery without angina pectoris: Secondary | ICD-10-CM | POA: Insufficient documentation

## 2020-10-26 DIAGNOSIS — W19XXXA Unspecified fall, initial encounter: Secondary | ICD-10-CM

## 2020-10-26 DIAGNOSIS — S0990XA Unspecified injury of head, initial encounter: Secondary | ICD-10-CM | POA: Insufficient documentation

## 2020-10-26 DIAGNOSIS — W11XXXA Fall on and from ladder, initial encounter: Secondary | ICD-10-CM | POA: Insufficient documentation

## 2020-10-26 DIAGNOSIS — S638X2A Sprain of other part of left wrist and hand, initial encounter: Secondary | ICD-10-CM | POA: Diagnosis not present

## 2020-10-26 DIAGNOSIS — S6992XA Unspecified injury of left wrist, hand and finger(s), initial encounter: Secondary | ICD-10-CM | POA: Diagnosis not present

## 2020-10-26 DIAGNOSIS — Z043 Encounter for examination and observation following other accident: Secondary | ICD-10-CM | POA: Diagnosis not present

## 2020-10-26 MED ORDER — MELOXICAM 7.5 MG PO TABS: 7.5000 mg | ORAL_TABLET | Freq: Every day | ORAL | 0 refills | Status: AC

## 2020-10-26 NOTE — ED Provider Notes (Signed)
[facility] Emergency Department Provider Note  ____________________________________________  Time seen: Approximately 7:33 PM  I have reviewed the triage vital signs and the nursing notes.   HISTORY  Chief Complaint Fall and Wrist Pain    HPI Jacob [name] is a 70 y.o. male who presents the emergency department complaining of left wrist injury.  Patient was on a ladder, missed a step coming down and fell off the ladder.  Patient tried to catch himself onto an outstretched hand/wrist.  Patient's primary complaint is left wrist pain.  Does not believe that he hit his head or loss consciousness.  Patient has medical history as described below with no complaints with chronic medical issues.  No medications prior to arrival.       Past Medical History:  Diagnosis Date   CAD (coronary artery disease), native coronary artery     multivessel ASCAD s/p CABG and s/p PCI of SVG to RI 2008   Carotid artery stenosis    s/p Left CEA in 2018.  Stable 1-39% bilateral carotid stenosis by dopplers 12/2019   Crohn's disease ([place])    flare 2000   Depression    Dyslipidemia    Dyspnea    ED (erectile dysfunction)    History of blood transfusion 2005   Hypertension    Myocardial infarction [facility]) 2005   5 bypasses   Nephrolithiasis    Stroke [facility])    TIA- Endartarectemy R side   TIA (transient ischemic attack)    Ulcerative colitis ([place])    Left sided    Patient Active Problem List   Diagnosis Date Noted   Crohn's colitis ([place]) 03/10/2018   Scrotal pain 12/07/2017   Carotid artery stenosis    Confusion    TIA (transient ischemic attack) 06/16/2016   Coronary atherosclerosis of native coronary artery 09/14/2013   Pure hypercholesterolemia 09/14/2013   Essential hypertension, benign 09/14/2013   Bradycardia, sinus 09/14/2013    Past Surgical History:  Procedure Laterality Date   CARDIAC CATHETERIZATION  06/08   Patnet grafts LAD, marginal, distal right    carpel tunnel surgery Left    COLONOSCOPY     COLONOSCOPY WITH PROPOFOL N/A 10/25/2012   Procedure: COLONOSCOPY WITH PROPOFOL;  Surgeon: Garlan Fair, MD;  Location: [facility]COPY;  Service: Endoscopy;  Laterality: N/A;   CORONARY ARTERY BYPASS GRAFT  2005   ASCVD,multivessel,S/P CABG  x5 total per patient   ENDARTERECTOMY Right 06/24/2016   Procedure: ENDARTERECTOMY CAROTID;  Surgeon: Algernon Huxley, MD;  Location: [facility] ORS;  Service: Vascular;  Laterality: Right;   HERNIA REPAIR  many years ago   KNEE ARTHROSCOPY Right 1995    Prior to Admission medications   Medication Sig Start Date End Date Taking? Authorizing Provider  meloxicam (MOBIC) 7.5 MG tablet Take 1 tablet (7.5 mg total) by mouth daily. 10/26/20 10/26/21 Yes Ellajane Stong, Charline Bills, PA-C  amitriptyline (ELAVIL) 25 MG tablet Take 1 tablet (25 mg total) by mouth at bedtime. Patient not taking: Reported on 08/05/2020 03/02/18   Abbie Sons, MD  aspirin EC 81 MG tablet Take 81 mg by mouth daily. Swallow whole.    [provider]  atorvastatin (LIPITOR) 40 MG tablet Take 1 tablet (40 mg total) by mouth daily. 06/28/20   Sueanne Margarita, MD  escitalopram (LEXAPRO) 10 MG tablet Take 10 mg daily by mouth.  Patient not taking: Reported on 08/05/2020 10/25/16   [provider]  lisinopril (ZESTRIL) 10 MG tablet Take 1 tablet (  10 mg total) by mouth daily. 06/28/20   Sueanne Margarita, MD  metoprolol succinate (TOPROL-XL) 25 MG 24 hr tablet Take 1/2 (one-half) tablet by mouth once daily 06/28/20   Sueanne Margarita, MD  vitamin B-12 (CYANOCOBALAMIN) 500 MCG tablet Take 500 mcg by mouth daily.    [provider]    Allergies Patient has no known allergies.  Family History  Problem Relation Age of Onset   Ovarian cancer Mother    CAD Father    CAD Sister    CAD Brother    CAD Brother    CAD Brother    Colon cancer Neg Hx    Esophageal cancer Neg Hx    Stomach cancer Neg Hx    Rectal cancer Neg Hx    Colon  polyps Neg Hx     Social History Social History   Tobacco Use   Smoking status: Never   Smokeless tobacco: Current    Types: Snuff  Vaping Use   Vaping Use: Never used  Substance Use Topics   Alcohol use: No   Drug use: No     Review of Systems  Constitutional: No fever/chills Eyes: No visual changes. No discharge ENT: No upper respiratory complaints. Cardiovascular: no chest pain. Respiratory: no cough. No SOB. Gastrointestinal: No abdominal pain.  No nausea, no vomiting.  No diarrhea.  No constipation. Musculoskeletal: Positive for left wrist injury/pain Skin: Negative for rash, abrasions, lacerations, ecchymosis. Neurological: Negative for headaches, focal weakness or numbness.  10 System ROS otherwise negative.  ____________________________________________   PHYSICAL EXAM:  VITAL SIGNS: ED Triage Vitals  Enc Vitals Group     BP 10/26/20 1842 (!) 160/84     Pulse Rate 10/26/20 1842 72     Resp 10/26/20 1842 18     Temp 10/26/20 1842 98.2 F (36.8 C)     Temp Source 10/26/20 1842 Oral     SpO2 10/26/20 1842 98 %     Weight 10/26/20 1843 190 lb (86.2 kg)     Height 10/26/20 1843 6' (1.829 m)     Head Circumference --      Peak Flow --      Pain Score 10/26/20 1842 9     Pain Loc --      Pain Edu? --      Excl. in [place]? --      Constitutional: Alert and oriented. Well appearing and in no acute distress. Eyes: Conjunctivae are normal. PERRL. EOMI. Head: Atraumatic. ENT:      Ears:       Nose: No congestion/rhinnorhea.      Mouth/Throat: Mucous membranes are moist.  Neck: No stridor.    Cardiovascular: Normal rate, regular rhythm. Normal S1 and S2.  Good peripheral circulation. Respiratory: Normal respiratory effort without tachypnea or retractions. Lungs CTAB. Good air entry to the bases with no decreased or absent breath sounds. Musculoskeletal: Full range of motion to all extremities. No gross deformities appreciated.  Visualization of the left wrist  reveals mild edema over the distal ulna.  There is no deformity.  Patient is still able to move the wrist appropriately.  Tender over the distal ulna with no palpable abnormalities.  Radial pulse intact.  Sensation intact all digits Neurologic:  Normal speech and language. No gross focal neurologic deficits are appreciated.  Skin:  Skin is warm, dry and intact. No rash noted. Psychiatric: Mood and affect are normal. Speech and behavior are normal. Patient exhibits appropriate insight and judgement.  ____________________________________________   LABS (all labs ordered are listed, but only abnormal results are displayed)  Labs Reviewed - No data to display ____________________________________________  EKG   ____________________________________________  RADIOLOGY I personally viewed and evaluated these images as part of my medical decision making, as well as reviewing the written report by the radiologist.  ED Provider Interpretation: No acute traumatic findings about the left wrist, head or neck  DG Forearm Left  Result Date: 10/26/2020 CLINICAL DATA:  Fall, left wrist injury EXAM: LEFT FOREARM - 2 VIEW COMPARISON:  None. FINDINGS: Normal alignment. No fracture or dislocation. No elbow effusion. Punctate radiodensity overlying the volar aspect of the left mid forearm likely represents a a density overlying skin surface. Degenerative enthesopathy involving the insertion of the common extensor tendon along the lateral epicondyle. IMPRESSION: No acute fracture or dislocation. Electronically Signed   By: Fidela Salisbury M.D.   On: 10/26/2020 19:20   DG Wrist Complete Left  Result Date: 10/26/2020 CLINICAL DATA:  Left wrist injury. EXAM: LEFT WRIST - COMPLETE 3+ VIEW COMPARISON:  None. FINDINGS: There is no evidence of fracture or dislocation. There is no evidence of arthropathy or other focal bone abnormality. Soft tissues are unremarkable. IMPRESSION: Negative. Electronically Signed   By:  Fidela Salisbury M.D.   On: 10/26/2020 19:19   CT Head Wo Contrast  Result Date: 10/26/2020 CLINICAL DATA:  Head trauma. EXAM: CT HEAD WITHOUT CONTRAST TECHNIQUE: Contiguous axial images were obtained from the base of the skull through the vertex without intravenous contrast. COMPARISON:  None. FINDINGS: Brain: No evidence of acute infarction, hemorrhage, hydrocephalus, extra-axial collection or mass lesion/mass effect. Vascular: No hyperdense vessel or unexpected calcification. Skull: Normal. Negative for fracture or focal lesion. Sinuses/Orbits: No acute finding. Other: None. IMPRESSION: No acute intracranial abnormality. Electronically Signed   By: Fidela Salisbury M.D.   On: 10/26/2020 19:23   CT Cervical Spine Wo Contrast  Result Date: 10/26/2020 CLINICAL DATA:  Post fall EXAM: CT CERVICAL SPINE WITHOUT CONTRAST TECHNIQUE: Multidetector CT imaging of the cervical spine was performed without intravenous contrast. Multiplanar CT image reconstructions were also generated. COMPARISON:  None. FINDINGS: Alignment: Normal. Skull base and vertebrae: No acute fracture. No primary bone lesion or focal pathologic process. Soft tissues and spinal canal: No prevertebral fluid or swelling. No visible canal hematoma. Disc levels:  Mild C5-C6 osteoarthritic changes. Upper chest: Negative. Other: None. IMPRESSION: 1. No evidence of acute traumatic injury to the cervical spine. 2. Mild C5-C6 osteoarthritic changes. Electronically Signed   By: Fidela Salisbury M.D.   On: 10/26/2020 19:25    ____________________________________________    PROCEDURES  Procedure(s) performed:    Procedures    Medications - No data to display   ____________________________________________   INITIAL IMPRESSION / ASSESSMENT AND PLAN / ED COURSE  Pertinent labs & imaging results that were available during my care of the patient were reviewed by me and considered in my medical decision making (see chart for  details).  Review of the [place] CSRS was performed in accordance of the [place] prior to dispensing any controlled drugs.           Patient's diagnosis is consistent with fall off a ladder, left wrist injury.  Patient presented to the emergency department after missing a step on the ladder.  Tripped on outstretched wrist and injured his wrist.  No other complaints.  Imaging is reassuring no fracture.  Patient given Velcro wrist brace for symptom control.  Anti-inflammatory for additional symptom control.  Follow-up  with primary care or orthopedics as needed..  Patient is given ED precautions to return to the ED for any worsening or new symptoms.     ____________________________________________  FINAL CLINICAL IMPRESSION(S) / ED DIAGNOSES  Final diagnoses:  Fall, initial encounter  Sprain of left wrist, initial encounter      NEW MEDICATIONS STARTED DURING THIS VISIT:  ED Discharge Orders          Ordered    meloxicam (MOBIC) 7.5 MG tablet  Daily        10/26/20 1942                This chart was dictated using voice recognition software/Dragon. Despite best efforts to proofread, errors can occur which can change the meaning. Any change was purely unintentional.    Darletta Moll, PA-C 10/26/20 1942    Lucrezia Starch, MD 10/26/20 2008

## 2020-10-26 NOTE — ED Triage Notes (Signed)
Pt via POV from home. Pt states he fell off a 5 ft ladder. Denies LOC. Denies head injury. Denies blood thinners. Pt c/o L wrist and forearm pain. Pt is A&Ox4 and NAD.

## 2020-11-08 DIAGNOSIS — U071 COVID-19: Secondary | ICD-10-CM | POA: Diagnosis not present

## 2020-12-03 DIAGNOSIS — Z961 Presence of intraocular lens: Secondary | ICD-10-CM | POA: Diagnosis not present

## 2020-12-03 DIAGNOSIS — H11001 Unspecified pterygium of right eye: Secondary | ICD-10-CM | POA: Diagnosis not present

## 2020-12-09 ENCOUNTER — Ambulatory Visit (INDEPENDENT_AMBULATORY_CARE_PROVIDER_SITE_OTHER): Payer: Medicare HMO | Admitting: Neurology

## 2020-12-09 ENCOUNTER — Encounter: Payer: Self-pay | Admitting: Neurology

## 2020-12-09 ENCOUNTER — Other Ambulatory Visit: Payer: Self-pay

## 2020-12-09 VITALS — BP 177/85 | HR 55 | Ht 72.0 in | Wt 206.0 lb

## 2020-12-09 DIAGNOSIS — E538 Deficiency of other specified B group vitamins: Secondary | ICD-10-CM | POA: Diagnosis not present

## 2020-12-09 DIAGNOSIS — G63 Polyneuropathy in diseases classified elsewhere: Secondary | ICD-10-CM | POA: Diagnosis not present

## 2020-12-09 NOTE — Progress Notes (Signed)
Follow-up Visit   Date: 12/09/20   Jacob [name] MRN: [id number] DOB: 04/15/1950   Interim History: Jacob [name] is a 70 y.o. right-handed Caucasian male with  CAD s/p CABG, R CEA, TIA , Crohn's disease, hypertension, hyperlipidemia returning to the clinic for follow-up of neuropathy.  The patient was accompanied to the clinic by self.  History of present illness: Starting around April 2022, he began having imbalance and numbness/tingling involving the toes and balls of the feet. In May, he saw his PCP for these symptoms and was found to have vitamin B12 deficiency (211) and once he started supplemention, his balance has improved. However, the numbness in his feet is unchanged.  Numbness is constant and involves the toes and balls of the feet.  He denies pain or weakness.  Repeat vitamin B12 is normal, per patient.    He has been on disability for heart disease (2005). He lives with wife, 51 year old, and 56 year-old grandson.  UPDATE 12/09/2920:  He is here for follow-up. There has been no change with the numbness of feet. He continues to have thick padding sensation over the balls of the feet. There has been no progression.  No pain or tingling.  Balance is good.  He had a fall off a 6 foot ladder and injury his left wrist. He got COVID a few weeks ago and has recovered from this.  His B12 level was rechecked by his PCP and improved.   Medications:  Current Outpatient Medications on File Prior to Visit  Medication Sig Dispense Refill   amitriptyline (ELAVIL) 25 MG tablet Take 1 tablet (25 mg total) by mouth at bedtime. 90 tablet 3   aspirin EC 81 MG tablet Take 81 mg by mouth daily. Swallow whole.     atorvastatin (LIPITOR) 40 MG tablet Take 1 tablet (40 mg total) by mouth daily. 90 tablet 3   escitalopram (LEXAPRO) 10 MG tablet Take 10 mg by mouth daily.     lisinopril (ZESTRIL) 10 MG tablet Take 1 tablet (10 mg total) by mouth daily. 90 tablet 3   meloxicam (MOBIC) 7.5  MG tablet Take 1 tablet (7.5 mg total) by mouth daily. 30 tablet 0   metoprolol succinate (TOPROL-XL) 25 MG 24 hr tablet Take 1/2 (one-half) tablet by mouth once daily 45 tablet 3   vitamin B-12 (CYANOCOBALAMIN) 500 MCG tablet Take 500 mcg by mouth daily.     Current Facility-Administered Medications on File Prior to Visit  Medication Dose Route Frequency Provider Last Rate Last Admin   0.9 %  sodium chloride infusion  500 mL Intravenous Once Armbruster, Carlota Raspberry, MD       0.9 %  sodium chloride infusion  500 mL Intravenous Once Cirigliano, Vito V, DO        Allergies: No Known Allergies  Vital Signs:  BP (!) 177/85   Pulse (!) 55   Ht 6' (1.829 m)   Wt 206 lb (93.4 kg)   SpO2 100%   BMI 27.94 kg/m   Neurological Exam: MENTAL STATUS including orientation to time, place, person, recent and remote memory, attention span and concentration, language, and fund of knowledge is normal.  Speech is not dysarthric.  CRANIAL NERVES:  Normal conjugate, extra-ocular eye movements in all directions of gaze.  No ptosis   MOTOR:  Motor strength is 5/5 in all extremities, including distally.  No atrophy, fasciculations or abnormal movements.  No pronator drift.  Tone is normal.  MSRs:  Reflexes are 2+/4 throughout, except absent at the ankles.  SENSORY:  Intact to vibration at the knees, reduced below the ankles.  COORDINATION/GAIT:   Gait narrow based and stable. Stressed gait intact. Mild unsteadiness with tandem gait.  Data:n/a  IMPRESSION/PLAN: Neuropathy affecting the feet, contributed by B12 deficiency.  Stable.   - Symptoms predominately numbness, no pain  - NCS/EMG declined  - Patient educated on daily foot inspection, fall prevention, and safety precautions around the home  Return to clinic in 1 year  Thank you for allowing me to participate in patient's care.  If I can answer any additional questions, I would be pleased to do so.    Sincerely,    Mukesh Kornegay K. Posey Pronto, DO

## 2020-12-09 NOTE — Patient Instructions (Signed)
Return to clinic in 1 year.

## 2020-12-31 IMAGING — CT CT RENAL STONE PROTOCOL
2 of 4 series · 15 of 46 positions shown, 17 images · non-contrast
Comparison: No priors.

CLINICAL DATA: 67-year-old male with complaint of scrotal pain.
Prior history of kidney stones.

EXAM:
CT ABDOMEN AND PELVIS WITHOUT CONTRAST
TECHNIQUE: Multidetector CT imaging of the abdomen and pelvis was performed
following the standard protocol without IV contrast.

[Series 2: renal stone · axial · 0.82mm/px · z∈[-1555,-1105]mm · 12 of 100 slices shown, 14 images (1 of 2)]
[im 5/100  soft-tissue]
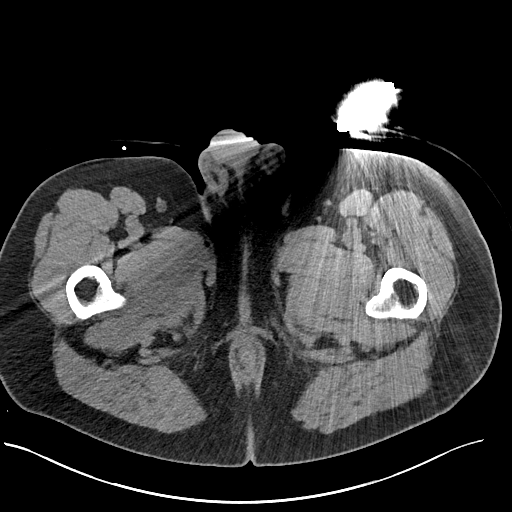
[im 5/100  bone]
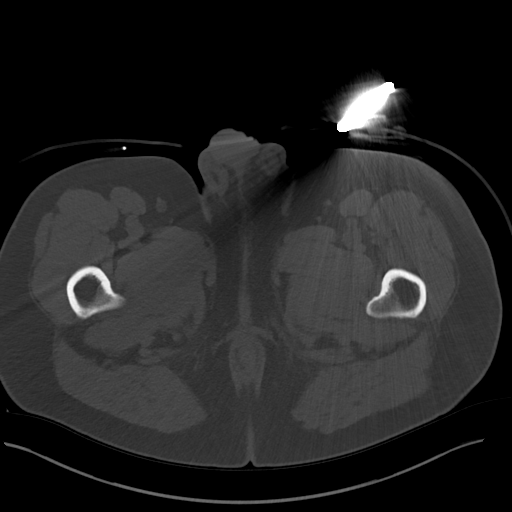
[im 13/100  soft-tissue]
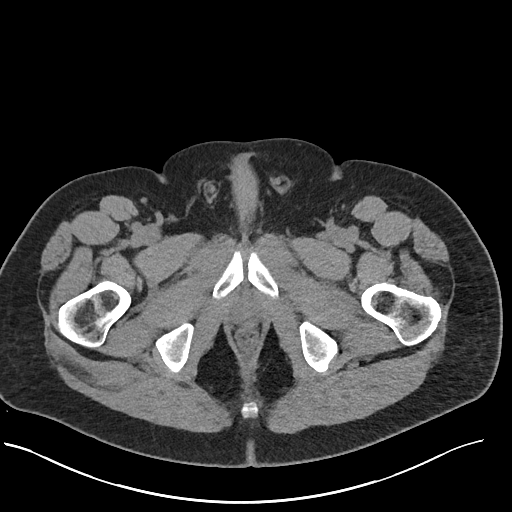
[im 21/100  soft-tissue]
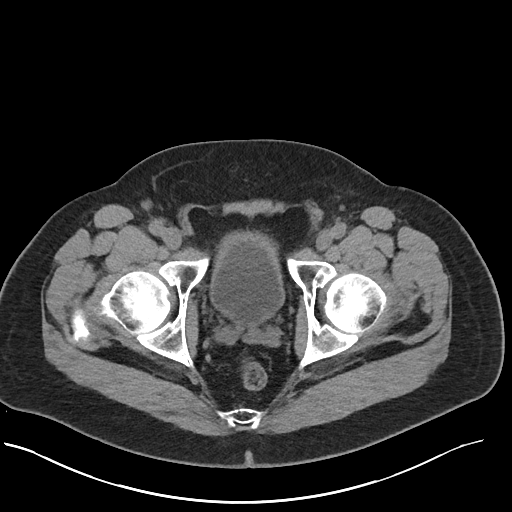
[im 29/100  soft-tissue]
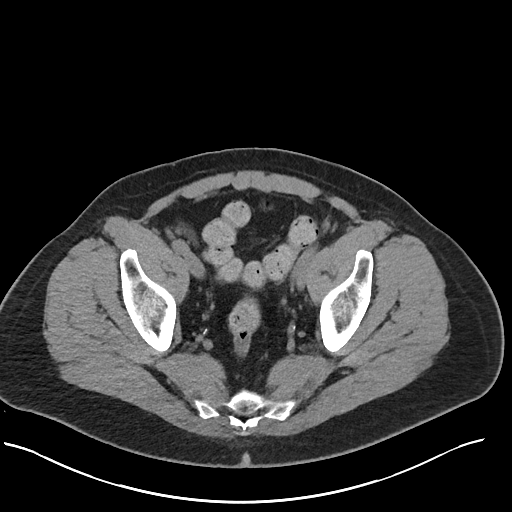
[im 38/100  soft-tissue]
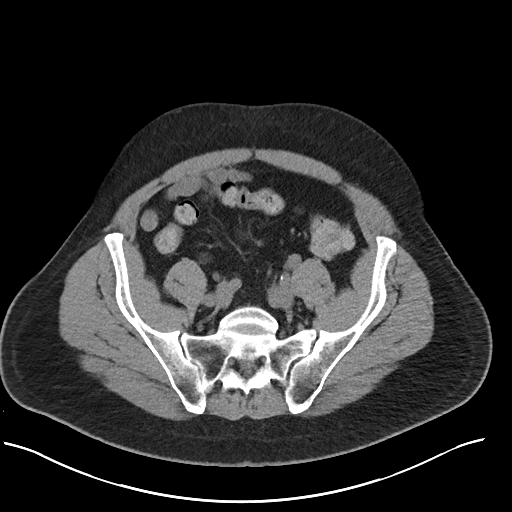
[im 46/100  soft-tissue]
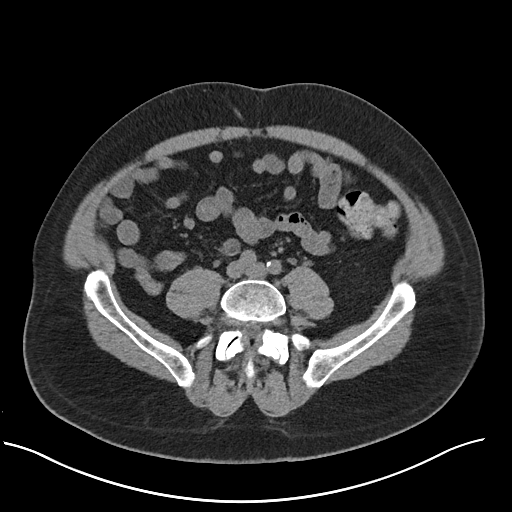
[im 54/100  soft-tissue]
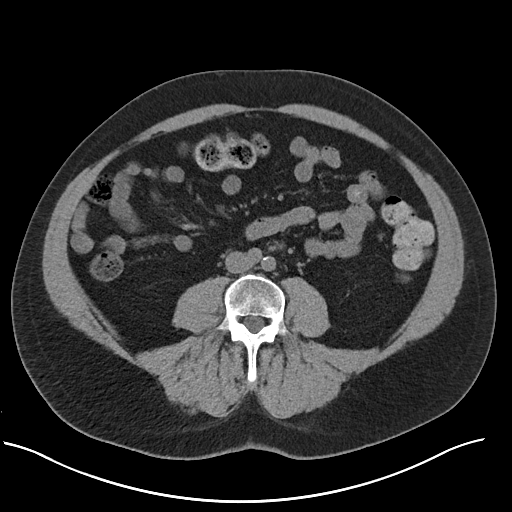
[im 62/100  soft-tissue]
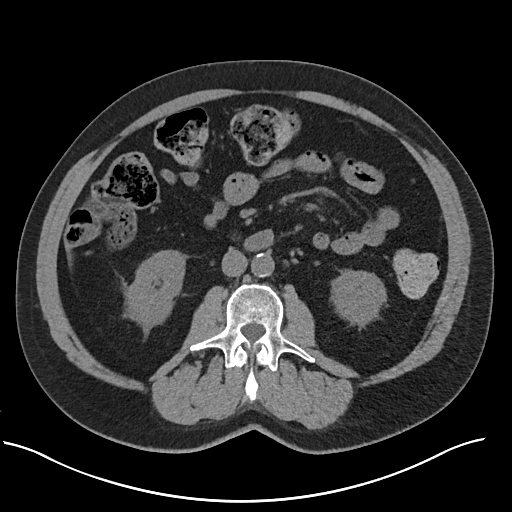
[im 71/100  soft-tissue]
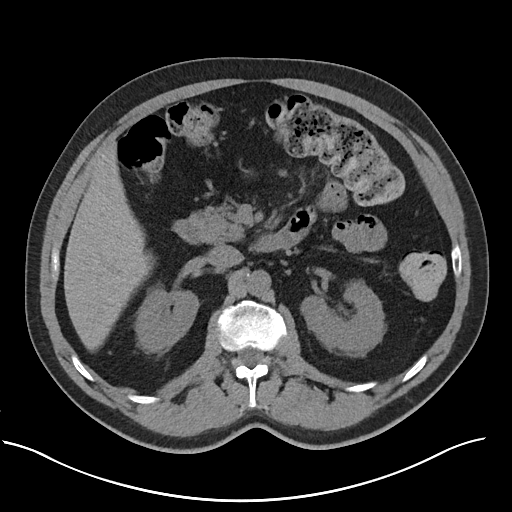
[im 71/100  bone]
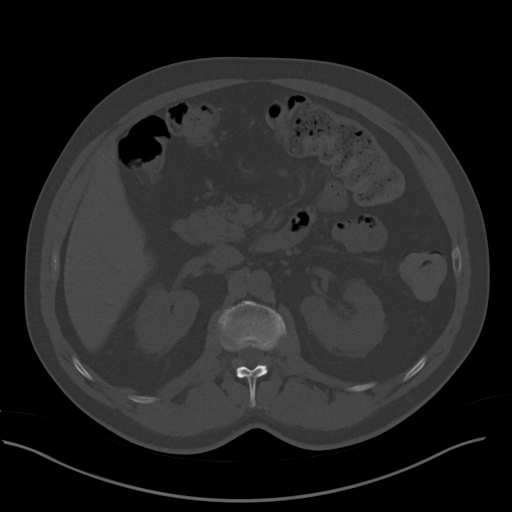
[im 79/100  soft-tissue]
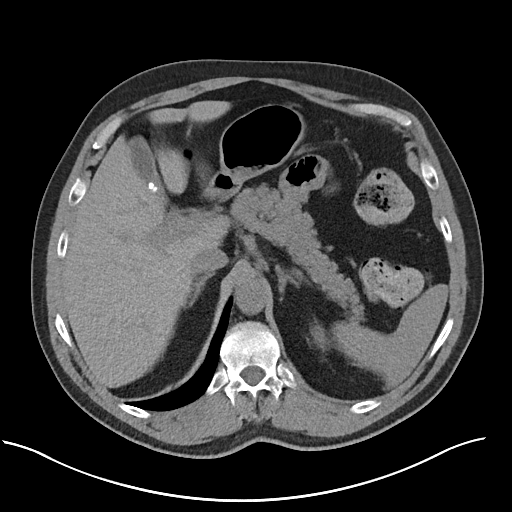
[im 87/100  soft-tissue]
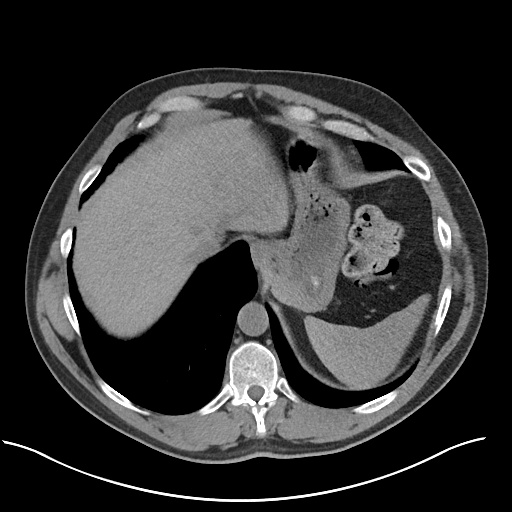
[im 95/100  soft-tissue]
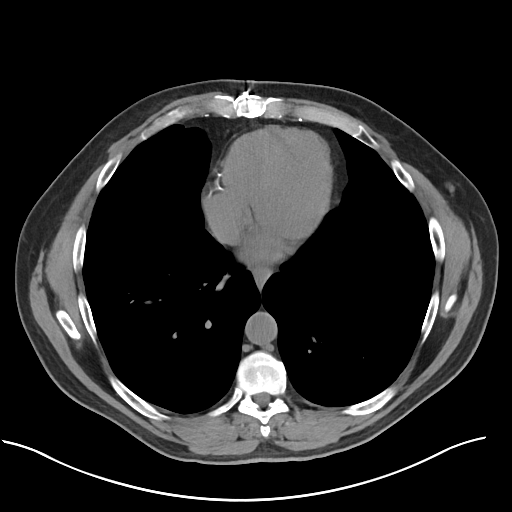

[Series 4: renal stone · coronal · 0.82mm/px · 3 of 170 slices shown (2 of 2)]
[im 57/170  soft-tissue]
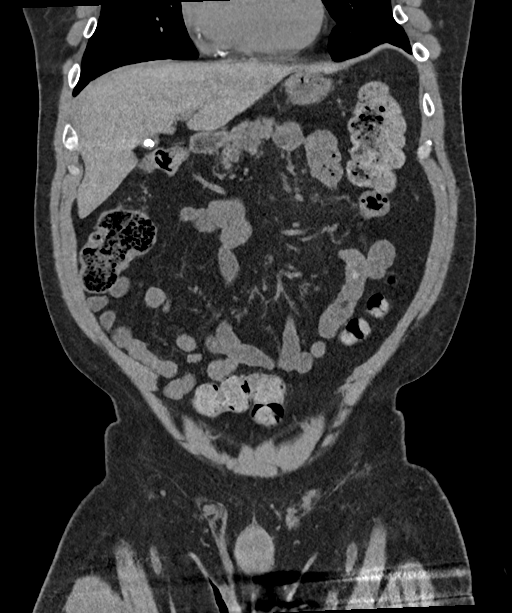
[im 76/170  soft-tissue]
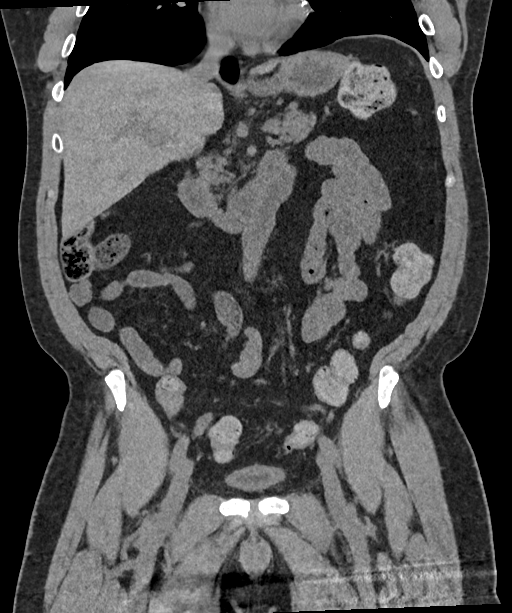
[im 94/170  soft-tissue]
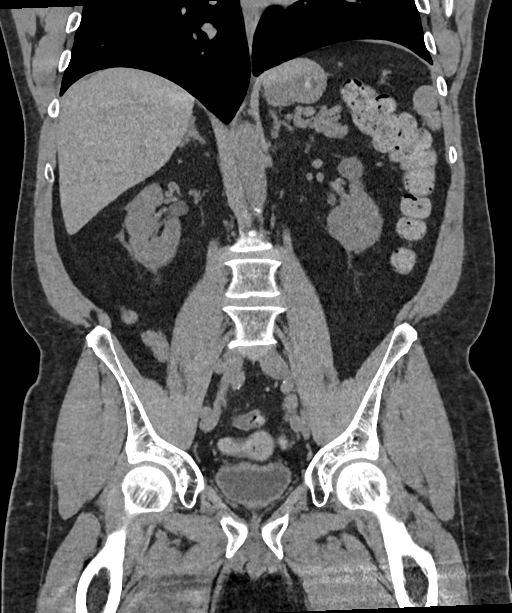

[15 of 46 positions shown; findings below may reference images not displayed]

FINDINGS: Lower chest: Atherosclerotic calcifications in the left circumflex
and right coronary arteries. Median sternotomy wires.

Hepatobiliary: No definite cystic or solid hepatic lesions are
confidently identified on today's noncontrast CT examination.
Multiple small calcified gallstones. No findings to suggest an acute
cholecystitis at this time.

Pancreas: No definite pancreatic mass or peripancreatic fluid or
inflammatory changes are noted on today's noncontrast CT
examination.

Spleen: Unremarkable.

Adrenals/Urinary Tract: There are no abnormal calcifications within
the collecting system of either kidney, along the course of either
ureter, or within the lumen of the urinary bladder. No
hydroureteronephrosis or perinephric stranding to suggest urinary
tract obstruction at this time. Exophytic low-attenuation lesion in
the anterior aspect of the interpolar region of the left kidney,
incompletely characterized on today's noncontrast CT examination,
but statistically likely to represent a cyst. Unenhanced appearance
of the right kidney is unremarkable. Unenhanced appearance of the
urinary bladder is normal. Bilateral adrenal glands are normal in
appearance.

Stomach/Bowel: Unenhanced appearance of the stomach is normal. No
pathologic dilatation of small bowel or colon. Normal appendix.

Vascular/Lymphatic: Aortic atherosclerosis. No lymphadenopathy noted
in the abdomen or pelvis.

Reproductive: Prostate gland and seminal vesicles are unremarkable
in appearance.

Other: No significant volume of ascites.  No pneumoperitoneum.

Musculoskeletal: Median sternotomy wires. There are no aggressive
appearing lytic or blastic lesions noted in the visualized portions
of the skeleton.
IMPRESSION: 1. No acute findings are noted in the abdomen or pelvis to account
for the patient's symptoms. Specifically, no urinary tract calculi.
2. Cholelithiasis without evidence of acute cholecystitis.
3. Aortic atherosclerosis, in addition to at least 2 vessel coronary
artery disease. Please note that although the presence of coronary
artery calcium documents the presence of coronary artery disease,
the severity of this disease and any potential stenosis cannot be
assessed on this non-gated CT examination. Assessment for potential
risk factor modification, dietary therapy or pharmacologic therapy
may be warranted, if clinically indicated.
4. Additional incidental findings, as above.

## 2021-01-02 DIAGNOSIS — Z0000 Encounter for general adult medical examination without abnormal findings: Secondary | ICD-10-CM | POA: Diagnosis not present

## 2021-01-02 DIAGNOSIS — Z1331 Encounter for screening for depression: Secondary | ICD-10-CM | POA: Diagnosis not present

## 2021-01-02 DIAGNOSIS — E785 Hyperlipidemia, unspecified: Secondary | ICD-10-CM | POA: Diagnosis not present

## 2021-01-02 DIAGNOSIS — Z139 Encounter for screening, unspecified: Secondary | ICD-10-CM | POA: Diagnosis not present

## 2021-01-02 DIAGNOSIS — Z9181 History of falling: Secondary | ICD-10-CM | POA: Diagnosis not present

## 2021-01-16 ENCOUNTER — Ambulatory Visit (HOSPITAL_COMMUNITY)
Admission: RE | Admit: 2021-01-16 | Discharge: 2021-01-16 | Disposition: A | Discharge status: Home / Self Care | Payer: Medicare HMO | Source: Ambulatory Visit | Attending: Cardiology | Admitting: Cardiology

## 2021-01-16 ENCOUNTER — Other Ambulatory Visit: Payer: Self-pay

## 2021-01-16 ENCOUNTER — Other Ambulatory Visit (HOSPITAL_COMMUNITY): Payer: Self-pay | Admitting: Cardiology

## 2021-01-16 DIAGNOSIS — I6523 Occlusion and stenosis of bilateral carotid arteries: Secondary | ICD-10-CM

## 2021-01-16 DIAGNOSIS — Z9889 Other specified postprocedural states: Secondary | ICD-10-CM | POA: Diagnosis not present

## 2021-01-23 ENCOUNTER — Telehealth: Payer: Self-pay | Admitting: Cardiology

## 2021-01-23 MED ORDER — ATORVASTATIN CALCIUM 40 MG PO TABS: 40.0000 mg | ORAL_TABLET | Freq: Every day | ORAL | 1 refills | Status: DC

## 2021-01-23 MED ORDER — LISINOPRIL 10 MG PO TABS: 10.0000 mg | ORAL_TABLET | Freq: Every day | ORAL | 1 refills | Status: DC

## 2021-01-23 MED ORDER — METOPROLOL SUCCINATE ER 25 MG PO TB24: ORAL_TABLET | ORAL | 1 refills | Status: DC

## 2021-01-23 NOTE — Telephone Encounter (Signed)
Pt's medication was sent to pt's pharmacy as requested. Confirmation received.  °

## 2021-01-23 NOTE — Telephone Encounter (Signed)
° °*  STAT* If patient is at the pharmacy, call can be transferred to refill team.   1. Which medications need to be refilled? (please list name of each medication and dose if known) atorvastatin (LIPITOR) 40 MG tablet lisinopril (ZESTRIL) 10 MG tablet  metoprolol succinate (TOPROL-XL) 25 MG 24 hr tablet    2. Which pharmacy/location (including street and city if local pharmacy) is medication to be sent to?[place] ([place]), [place] - [address]  3. Do they need a 30 day or 90 day supply? 90 days   Pt is out of meds, needs refill today

## 2021-03-18 DIAGNOSIS — Z79899 Other long term (current) drug therapy: Secondary | ICD-10-CM | POA: Diagnosis not present

## 2021-03-18 DIAGNOSIS — R69 Illness, unspecified: Secondary | ICD-10-CM | POA: Diagnosis not present

## 2021-03-18 DIAGNOSIS — N529 Male erectile dysfunction, unspecified: Secondary | ICD-10-CM | POA: Diagnosis not present

## 2021-03-18 DIAGNOSIS — K5190 Ulcerative colitis, unspecified, without complications: Secondary | ICD-10-CM | POA: Diagnosis not present

## 2021-03-18 DIAGNOSIS — Z23 Encounter for immunization: Secondary | ICD-10-CM | POA: Diagnosis not present

## 2021-03-18 DIAGNOSIS — I10 Essential (primary) hypertension: Secondary | ICD-10-CM | POA: Diagnosis not present

## 2021-03-18 DIAGNOSIS — M792 Neuralgia and neuritis, unspecified: Secondary | ICD-10-CM | POA: Diagnosis not present

## 2021-03-18 DIAGNOSIS — E538 Deficiency of other specified B group vitamins: Secondary | ICD-10-CM | POA: Diagnosis not present

## 2021-03-18 DIAGNOSIS — G4700 Insomnia, unspecified: Secondary | ICD-10-CM | POA: Diagnosis not present

## 2021-03-18 DIAGNOSIS — E785 Hyperlipidemia, unspecified: Secondary | ICD-10-CM | POA: Diagnosis not present

## 2021-04-11 ENCOUNTER — Encounter: Payer: Self-pay | Admitting: Neurology

## 2021-04-14 DIAGNOSIS — N529 Male erectile dysfunction, unspecified: Secondary | ICD-10-CM | POA: Diagnosis not present

## 2021-04-14 DIAGNOSIS — N401 Enlarged prostate with lower urinary tract symptoms: Secondary | ICD-10-CM | POA: Diagnosis not present

## 2021-04-14 DIAGNOSIS — R350 Frequency of micturition: Secondary | ICD-10-CM | POA: Diagnosis not present

## 2021-04-16 ENCOUNTER — Encounter: Payer: Self-pay | Admitting: Neurology

## 2021-04-16 DIAGNOSIS — R948 Abnormal results of function studies of other organs and systems: Secondary | ICD-10-CM | POA: Diagnosis not present

## 2021-04-16 DIAGNOSIS — N529 Male erectile dysfunction, unspecified: Secondary | ICD-10-CM | POA: Diagnosis not present

## 2021-05-08 DIAGNOSIS — R350 Frequency of micturition: Secondary | ICD-10-CM | POA: Diagnosis not present

## 2021-05-08 DIAGNOSIS — N529 Male erectile dysfunction, unspecified: Secondary | ICD-10-CM | POA: Diagnosis not present

## 2021-05-08 DIAGNOSIS — N401 Enlarged prostate with lower urinary tract symptoms: Secondary | ICD-10-CM | POA: Diagnosis not present

## 2021-05-08 DIAGNOSIS — E291 Testicular hypofunction: Secondary | ICD-10-CM | POA: Diagnosis not present

## 2021-06-09 ENCOUNTER — Ambulatory Visit: Payer: Medicare HMO | Admitting: Neurology

## 2021-06-19 DIAGNOSIS — E291 Testicular hypofunction: Secondary | ICD-10-CM | POA: Diagnosis not present

## 2021-06-19 DIAGNOSIS — R948 Abnormal results of function studies of other organs and systems: Secondary | ICD-10-CM | POA: Diagnosis not present

## 2021-07-27 ENCOUNTER — Other Ambulatory Visit: Payer: Self-pay | Admitting: Cardiology

## 2021-07-29 DIAGNOSIS — W57XXXA Bitten or stung by nonvenomous insect and other nonvenomous arthropods, initial encounter: Secondary | ICD-10-CM | POA: Diagnosis not present

## 2021-07-29 DIAGNOSIS — I10 Essential (primary) hypertension: Secondary | ICD-10-CM | POA: Diagnosis not present

## 2021-08-19 ENCOUNTER — Other Ambulatory Visit: Payer: Self-pay | Admitting: Cardiology

## 2021-08-26 DIAGNOSIS — R001 Bradycardia, unspecified: Secondary | ICD-10-CM | POA: Diagnosis not present

## 2021-08-26 DIAGNOSIS — I10 Essential (primary) hypertension: Secondary | ICD-10-CM | POA: Diagnosis not present

## 2021-08-31 ENCOUNTER — Other Ambulatory Visit: Payer: Self-pay | Admitting: Cardiology

## 2021-09-01 ENCOUNTER — Other Ambulatory Visit: Payer: Self-pay

## 2021-09-01 MED ORDER — METOPROLOL SUCCINATE ER 25 MG PO TB24: 12.5000 mg | ORAL_TABLET | Freq: Every day | ORAL | 0 refills | Status: DC

## 2021-09-04 DIAGNOSIS — N529 Male erectile dysfunction, unspecified: Secondary | ICD-10-CM | POA: Diagnosis not present

## 2021-09-15 DIAGNOSIS — N529 Male erectile dysfunction, unspecified: Secondary | ICD-10-CM | POA: Diagnosis not present

## 2021-09-15 DIAGNOSIS — N400 Enlarged prostate without lower urinary tract symptoms: Secondary | ICD-10-CM | POA: Diagnosis not present

## 2021-09-15 DIAGNOSIS — E291 Testicular hypofunction: Secondary | ICD-10-CM | POA: Diagnosis not present

## 2021-09-23 ENCOUNTER — Telehealth: Payer: Self-pay | Admitting: Cardiology

## 2021-09-23 MED ORDER — METOPROLOL SUCCINATE ER 25 MG PO TB24: 12.5000 mg | ORAL_TABLET | Freq: Every day | ORAL | 0 refills | Status: DC

## 2021-09-23 NOTE — Telephone Encounter (Signed)
*  STAT* If patient is at the pharmacy, call can be transferred to refill team.   1. Which medications need to be refilled? (please list name of each medication and dose if known)   metoprolol succinate (TOPROL-XL) 25 MG 24 hr tablet  2. Which pharmacy/location (including street and city if local pharmacy) is medication to be sent to?  [place] ([place]), [place] - [address]  3. Do they need a 30 day or 90 day supply?  90 day  Patient stated he is completely out of this medication. Patient has appointment on 10/13/21 at 11:20 am

## 2021-09-23 NOTE — Telephone Encounter (Signed)
Refill sent for 30D supply with reminder to keep upcoming OV for further refills, then can refill for 90D.

## 2021-09-25 ENCOUNTER — Other Ambulatory Visit: Payer: Self-pay

## 2021-09-25 MED ORDER — ATORVASTATIN CALCIUM 40 MG PO TABS: 40.0000 mg | ORAL_TABLET | Freq: Every day | ORAL | 0 refills | Status: DC

## 2021-10-05 ENCOUNTER — Other Ambulatory Visit: Payer: Self-pay | Admitting: Cardiology

## 2021-10-11 NOTE — Progress Notes (Unsigned)
Office Visit    Patient Name: Jacob [name] Date of Encounter: 10/13/2021  PCP:  Leonides Sake, MD   [place]  Cardiologist:  Fransico Him, MD  Advanced Practice Provider:  No care team member to display Electrophysiologist:  None   HPI    Jacob [name] is a 71 y.o. male with past medical history significant for multivessel AAS CAD status post CABG and status post PCI of SVG to RI in 2008, dyslipidemia, and hypertension presents today for annual follow-up visit.  The patient was last seen 6/22 by Dr. Radford Pax.  At that time he denied any chest pain or pressure, SOB, DOE, PND, orthopnea, lower extremity edema, palpitations or syncope.  Was having some dizziness and found that he was vitamin B12 deficient and it had improved with B12 injections.  Today, he states that he has some heaviness on the left side of his chest when he is moving around.  It happens a couple times a week.  He does not have a nitroglycerin prescription which we have provided for him today.  He states that he had 7 stents placed.  He does stay active with his 47-year-old grandson.  They go fishing together, biking, and shooting BB guns.  He has a history of coronary bypass grafting back in 2005 and PCI back in 2008.  He had a carotid endarterectomy back in 2018.  Due to his history and risk factors we have ordered a Moore today for further workup.  He does not think he could complete an exercise stress test.  Reports no shortness of breath nor dyspnea on exertion. No edema, orthopnea, PND. Reports no palpitations.    Past Medical History    Past Medical History:  Diagnosis Date   CAD (coronary artery disease), native coronary artery     multivessel ASCAD s/p CABG and s/p PCI of SVG to RI 2008   Carotid artery stenosis    s/p Left CEA in 2018.  Stable 1-39% bilateral carotid stenosis by dopplers 12/2019   Crohn's disease ([place])    flare 2000   Depression     Dyslipidemia    Dyspnea    ED (erectile dysfunction)    History of blood transfusion 2005   Hypertension    Myocardial infarction [facility]) 2005   5 bypasses   Nephrolithiasis    Stroke [facility])    TIA- Endartarectemy R side   TIA (transient ischemic attack)    Ulcerative colitis ([place])    Left sided   Past Surgical History:  Procedure Laterality Date   CARDIAC CATHETERIZATION  06/08   Patnet grafts LAD, marginal, distal right   carpel tunnel surgery Left    COLONOSCOPY     COLONOSCOPY WITH PROPOFOL N/A 10/25/2012   Procedure: COLONOSCOPY WITH PROPOFOL;  Surgeon: Garlan Fair, MD;  Location: [facility] ENDOSCOPY;  Service: Endoscopy;  Laterality: N/A;   CORONARY ARTERY BYPASS GRAFT  2005   ASCVD,multivessel,S/P CABG  x5 total per patient   ENDARTERECTOMY Right 06/24/2016   Procedure: ENDARTERECTOMY CAROTID;  Surgeon: Algernon Huxley, MD;  Location: [facility] ORS;  Service: Vascular;  Laterality: Right;   HERNIA REPAIR  many years ago   KNEE ARTHROSCOPY Right 1995    Allergies  No Known Allergies   EKGs/Labs/Other Studies Reviewed:   The following studies were reviewed today:   Carotid ultrasound 01/16/2021  Summary:  Right Carotid: Velocities in the right ICA are consistent with a 1-39%  stenosis.  Left Carotid: Velocities in the left ICA are consistent with a 1-39%  stenosis.   Vertebrals:  Bilateral vertebral arteries demonstrate antegrade flow.  Subclavians: Right subclavian artery flow was disturbed. Normal flow               hemodynamics were seen in the left subclavian artery.   *See table(s) above for measurements and observations.  Suggest follow up study in 12 months.   EKG:  EKG is  ordered today.  The ekg ordered today demonstrates normal sinus rhythm, rate 60 bpm  Recent Labs: No results found for requested labs within last 365 days.  Recent Lipid Panel    Component Value Date/Time   CHOL 126 06/07/2019 0925   TRIG 101 06/07/2019 0925   HDL 40 06/07/2019 0925    CHOLHDL 3.2 06/07/2019 0925   CHOLHDL 3.3 06/17/2016 0523   VLDL 22 06/17/2016 0523   LDLCALC 67 06/07/2019 0925   LDLDIRECT 71 01/24/2013 0914     Home Medications   Current Meds  Medication Sig   amitriptyline (ELAVIL) 25 MG tablet Take 1 tablet (25 mg total) by mouth at bedtime.   anastrozole (ARIMIDEX) 1 MG tablet SMARTSIG:0.5 Tablet(s) By Mouth 3 Times a Week   aspirin EC 81 MG tablet Take 81 mg by mouth daily. Swallow whole.   meloxicam (MOBIC) 7.5 MG tablet Take 1 tablet (7.5 mg total) by mouth daily.   NATESTO 5.5 MG/ACT GEL SMARTSIG:1 Application Both Nares 3 Times Daily   nitroGLYCERIN (NITROSTAT) 0.4 MG SL tablet Place 1 tablet (0.4 mg total) under the tongue every 5 (five) minutes as needed for chest pain.   tamsulosin (FLOMAX) 0.4 MG CAPS capsule Take 0.4 mg by mouth at bedtime.   vitamin B-12 (CYANOCOBALAMIN) 500 MCG tablet Take 1,000 mcg by mouth daily.   [DISCONTINUED] amLODipine (NORVASC) 5 MG tablet Take 5 mg by mouth daily.   [DISCONTINUED] atorvastatin (LIPITOR) 40 MG tablet Take 1 tablet (40 mg total) by mouth daily.   [DISCONTINUED] lisinopril (ZESTRIL) 20 MG tablet Take 20 mg by mouth daily.   [DISCONTINUED] metoprolol succinate (TOPROL-XL) 25 MG 24 hr tablet Take 0.5 tablets (12.5 mg total) by mouth daily. MUST KEEP APPT FOR FURTHER REFILLS   Current Facility-Administered Medications for the 10/13/21 encounter (Office Visit) with Elgie Collard, PA-C  Medication   0.9 %  sodium chloride infusion   0.9 %  sodium chloride infusion     Review of Systems      All other systems reviewed and are otherwise negative except as noted above.  Physical Exam    VS:  BP (!) 130/90   Pulse 60   Ht 6' (1.829 m)   Wt 205 lb 6.4 oz (93.2 kg)   SpO2 98%   BMI 27.86 kg/m  , BMI Body mass index is 27.86 kg/m.  Wt Readings from Last 3 Encounters:  10/13/21 205 lb 6.4 oz (93.2 kg)  12/09/20 206 lb (93.4 kg)  10/26/20 190 lb (86.2 kg)     GEN: Well nourished, well  developed, in no acute distress. HEENT: normal. Neck: Supple, no JVD, carotid bruits, or masses. Cardiac: RRR, no murmurs, rubs, or gallops. No clubbing, cyanosis, edema.  Radials/PT 2+ and equal bilaterally.  Respiratory:  Respirations regular and unlabored, clear to auscultation bilaterally. GI: Soft, nontender, nondistended. MS: No deformity or atrophy. Skin: Warm and dry, no rash. Neuro:  Strength and sensation are intact. Psych: Normal affect.  Assessment & Plan    CAD -s/p  CABG 2005 and PCI 2008 -Continue GDMT with Norvasc 5 mg daily, aspirin 81 mg daily, Lipitor 40 mg daily, lisinopril 20 mg daily, metoprolol succinate 12.5 mg daily -I will add nitroglycerin sublingual tabs as needed for chest pain -Lexiscan Myoview ordered today  Carotid artery stenosis -s/p CEA 2018 -due for [place] 12/23  Hypertension -Well-controlled today -Continue current medication regimen  Bradycardia -Heart rate 60 bpm today -Continue current medications, asymptomatic  Hyperlipidemia -continue Lipitor 71m daily    Disposition: Follow up after Lexiscan Myoview with TFransico Him MD or APP.  Signed, TElgie Collard PA-C 10/13/2021, 12:42 PM [facility]

## 2021-10-13 ENCOUNTER — Encounter: Payer: Self-pay | Admitting: Physician Assistant

## 2021-10-13 ENCOUNTER — Encounter: Payer: Self-pay | Admitting: *Deleted

## 2021-10-13 ENCOUNTER — Ambulatory Visit: Payer: Medicare HMO | Attending: Physician Assistant | Admitting: Physician Assistant

## 2021-10-13 VITALS — BP 130/90 | HR 60 | Ht 72.0 in | Wt 205.4 lb

## 2021-10-13 DIAGNOSIS — R079 Chest pain, unspecified: Secondary | ICD-10-CM

## 2021-10-13 DIAGNOSIS — I10 Essential (primary) hypertension: Secondary | ICD-10-CM | POA: Diagnosis not present

## 2021-10-13 DIAGNOSIS — R0789 Other chest pain: Secondary | ICD-10-CM

## 2021-10-13 DIAGNOSIS — Z9889 Other specified postprocedural states: Secondary | ICD-10-CM

## 2021-10-13 DIAGNOSIS — I6523 Occlusion and stenosis of bilateral carotid arteries: Secondary | ICD-10-CM

## 2021-10-13 DIAGNOSIS — R001 Bradycardia, unspecified: Secondary | ICD-10-CM | POA: Diagnosis not present

## 2021-10-13 DIAGNOSIS — E785 Hyperlipidemia, unspecified: Secondary | ICD-10-CM

## 2021-10-13 DIAGNOSIS — I2510 Atherosclerotic heart disease of native coronary artery without angina pectoris: Secondary | ICD-10-CM

## 2021-10-13 MED ORDER — LISINOPRIL 20 MG PO TABS: 20.0000 mg | ORAL_TABLET | Freq: Every day | ORAL | 3 refills | Status: DC

## 2021-10-13 MED ORDER — ATORVASTATIN CALCIUM 40 MG PO TABS: 40.0000 mg | ORAL_TABLET | Freq: Every day | ORAL | 3 refills | Status: DC

## 2021-10-13 MED ORDER — AMLODIPINE BESYLATE 5 MG PO TABS: 5.0000 mg | ORAL_TABLET | Freq: Every day | ORAL | 3 refills | Status: DC

## 2021-10-13 MED ORDER — METOPROLOL SUCCINATE ER 25 MG PO TB24: 12.5000 mg | ORAL_TABLET | Freq: Every day | ORAL | 3 refills | Status: DC

## 2021-10-13 MED ORDER — NITROGLYCERIN 0.4 MG SL SUBL: 0.4000 mg | SUBLINGUAL_TABLET | SUBLINGUAL | 5 refills | Status: DC | PRN

## 2021-10-13 NOTE — Patient Instructions (Signed)
Medication Instructions:  1.Start sublingual nitroglycerin 0.4, place one tablet under the tongue as needed for chest pain, max 3 doses, if no relief call 911 *If you need a refill on your cardiac medications before your next appointment, please call your pharmacy*   Lab Work: None If you have labs (blood work) drawn today and your tests are completely normal, you will receive your results only by: [place] (if you have MyChart) OR A paper copy in the mail If you have any lab test that is abnormal or we need to change your treatment, we will call you to review the results.   Testing/Procedures: Your physician has requested that you have a lexiscan myoview. For further information please visit [email or web address]. Please follow instruction sheet, as given.    Follow-Up: At [facility], you and your health needs are our priority.  As part of our continuing mission to provide you with exceptional heart care, we have created designated Provider Care Teams.  These Care Teams include your primary Cardiologist (physician) and Advanced Practice Providers (APPs -  Physician Assistants and Nurse Practitioners) who all work together to provide you with the care you need, when you need it.  We recommend signing up for the patient portal called "MyChart".  Sign up information is provided on this After Visit Summary.  MyChart is used to connect with patients for Virtual Visits (Telemedicine).  Patients are able to view lab/test results, encounter notes, upcoming appointments, etc.  Non-urgent messages can be sent to your provider as well.   To learn more about what you can do with MyChart, go to [email or web address].    Your next appointment:   Schedule a follow up appointment for a date that is after testing  The format for your next appointment:   In Person  Provider:   Fransico Him, MD  or Nicholes Rough, PA-C        Important Information About Sugar

## 2021-10-29 ENCOUNTER — Telehealth (HOSPITAL_COMMUNITY): Payer: Self-pay | Admitting: *Deleted

## 2021-10-29 NOTE — Telephone Encounter (Signed)
Patient given detailed instructions per Myocardial Perfusion Study Information Sheet for the test on 11/03/2021 at 7:45. Patient notified to arrive 15 minutes early and that it is imperative to arrive on time for appointment to keep from having the test rescheduled.  If you need to cancel or reschedule your appointment, please call the office within 24 hours of your appointment. . Patient verbalized understanding.Jacob [name]

## 2021-11-03 ENCOUNTER — Ambulatory Visit (HOSPITAL_COMMUNITY): Payer: Medicare HMO | Attending: Cardiology

## 2021-11-03 DIAGNOSIS — R079 Chest pain, unspecified: Secondary | ICD-10-CM | POA: Insufficient documentation

## 2021-11-03 LAB — MYOCARDIAL PERFUSION IMAGING
LV dias vol: 99 mL (ref 62–150)
LV sys vol: 45 mL
Nuc Stress EF: 55 %
Peak HR: 82 {beats}/min
Rest HR: 67 {beats}/min
Rest Nuclear Isotope Dose: 10.5 mCi
SDS: 1
SRS: 0
SSS: 1
ST Depression (mm): 0 mm
Stress Nuclear Isotope Dose: 31.2 mCi
TID: 0.96

## 2021-11-03 MED ORDER — REGADENOSON 0.4 MG/5ML IV SOLN
0.4000 mg | Freq: Once | INTRAVENOUS | Status: AC
  Administered 2021-11-03: 0.4 mg via INTRAVENOUS

## 2021-11-03 MED ORDER — TECHNETIUM TC 99M TETROFOSMIN IV KIT
10.5000 | PACK | Freq: Once | INTRAVENOUS | Status: AC | PRN
  Administered 2021-11-03: 10.5 via INTRAVENOUS

## 2021-11-03 MED ORDER — TECHNETIUM TC 99M TETROFOSMIN IV KIT
31.2000 | PACK | Freq: Once | INTRAVENOUS | Status: AC | PRN
  Administered 2021-11-03: 31.2 via INTRAVENOUS

## 2021-11-27 NOTE — Progress Notes (Signed)
Follow-up Visit   Date: 11/28/21   Jacob [name] MRN: [id number] DOB: [birth year]   Interim History: Jacob [name] is a 71 y.o. right-handed Caucasian male with  CAD s/p CABG, R CEA, TIA , Crohn's disease, hypertension, hyperlipidemia returning to the clinic for follow-up of neuropathy.  The patient was accompanied to the clinic by self.   IMPRESSION/PLAN: Neuropathy affecting the feet, contributed by B12 and age. Symptoms are primarily numbness in the feet and sensory ataxia.  He does not have associated pain.  I had extensive discussion with the patient regarding the pathogenesis, etiology, management, and natural course of neuropathy. Neuropathy tends to be slowly progressive and management is supportive.  Unfortunately, there is no cure or treatment for numbness.  Given his classic exam findings and history, NCS/EMG was declined.  Patient educated on daily foot inspection, fall prevention, and safety precautions around the home.    Return to clinic as needed  ----------------------------------------------------------  History of present illness: Starting around April 2022, he began having imbalance and numbness/tingling involving the toes and balls of the feet. In May, he saw his PCP for these symptoms and was found to have vitamin B12 deficiency (211) and once he started supplemention, his balance has improved. However, the numbness in his feet is unchanged.  Numbness is constant and involves the toes and balls of the feet.  He denies pain or weakness.  Repeat vitamin B12 is normal, per patient.    He has been on disability for heart disease (2005). He lives with wife, 78 year old, and 34 year-old grandson.  UPDATE 12/09/2920:  He is here for follow-up. There has been no change with the numbness of feet. He continues to have thick padding sensation over the balls of the feet. There has been no progression.  No pain or tingling.  Balance is good.  He had a fall off a 6 foot  ladder and injury his left wrist. He got COVID a few weeks ago and has recovered from this.  His B12 level was rechecked by his PCP and improved.  UPDATE 11/28/2021:  He is here for follow-up.  There hsa been no change in the numbness of his feet, which remains at the pads of the toes and ball of the feet.  No associated tingling.  He sometimes has sensation that his feet are cold or wet.  He has noticed weakness of toe movement and imbalance.  Fortunately, no falls. He remains on B12 supplementation. No new complaints.   Medications:  Current Outpatient Medications on File Prior to Visit  Medication Sig Dispense Refill   amitriptyline (ELAVIL) 25 MG tablet Take 1 tablet (25 mg total) by mouth at bedtime. 90 tablet 3   amLODipine (NORVASC) 5 MG tablet Take 1 tablet (5 mg total) by mouth daily. 90 tablet 3   anastrozole (ARIMIDEX) 1 MG tablet SMARTSIG:0.5 Tablet(s) By Mouth 3 Times a Week     aspirin EC 81 MG tablet Take 81 mg by mouth daily. Swallow whole.     atorvastatin (LIPITOR) 40 MG tablet Take 1 tablet (40 mg total) by mouth daily. 90 tablet 3   escitalopram (LEXAPRO) 10 MG tablet Take 10 mg by mouth daily.     lisinopril (ZESTRIL) 20 MG tablet Take 1 tablet (20 mg total) by mouth daily. 90 tablet 3   metoprolol succinate (TOPROL-XL) 25 MG 24 hr tablet Take 0.5 tablets (12.5 mg total) by mouth daily. 90 tablet 3   NATESTO 5.5 MG/ACT GEL  SMARTSIG:1 Application Both Nares 3 Times Daily     nitroGLYCERIN (NITROSTAT) 0.4 MG SL tablet Place 1 tablet (0.4 mg total) under the tongue every 5 (five) minutes as needed for chest pain. 25 tablet 5   tamsulosin (FLOMAX) 0.4 MG CAPS capsule Take 0.4 mg by mouth at bedtime.     vitamin B-12 (CYANOCOBALAMIN) 500 MCG tablet Take 1,000 mcg by mouth daily.     Current Facility-Administered Medications on File Prior to Visit  Medication Dose Route Frequency Provider Last Rate Last Admin   0.9 %  sodium chloride infusion  500 mL Intravenous Once Armbruster,  Carlota Raspberry, MD       0.9 %  sodium chloride infusion  500 mL Intravenous Once Cirigliano, Vito V, DO        Allergies: No Known Allergies  Vital Signs:  BP (!) 157/81   Pulse 64   Ht 6' (1.829 m)   Wt 208 lb (94.3 kg)   SpO2 98%   BMI 28.21 kg/m   Neurological Exam: MENTAL STATUS including orientation to time, place, person, recent and remote memory, attention span and concentration, language, and fund of knowledge is normal.  Speech is not dysarthric.  CRANIAL NERVES:  Normal conjugate, extra-ocular eye movements in all directions of gaze.  No ptosis   MOTOR:  Motor strength is 5/5 in all extremities, except distally in the feet with toe extension and flexion 4/5.  No atrophy, fasciculations or abnormal movements.  No pronator drift.  Tone is normal.    MSRs:  Reflexes are 2+/4 throughout, except absent at the ankles.  SENSORY:  Intact to vibration at the knees, reduced below the ankles.  COORDINATION/GAIT:   Gait narrow based and stable. Unsteadiness with tandem gait.   Data:n/a   Thank you for allowing me to participate in patient's care.  If I can answer any additional questions, I would be pleased to do so.    Sincerely,    Avalina Benko K. Posey Pronto, DO

## 2021-11-28 ENCOUNTER — Encounter: Payer: Self-pay | Admitting: Neurology

## 2021-11-28 ENCOUNTER — Ambulatory Visit: Payer: Medicare HMO | Admitting: Neurology

## 2021-11-28 VITALS — BP 157/81 | HR 64 | Ht 72.0 in | Wt 208.0 lb

## 2021-11-28 DIAGNOSIS — G63 Polyneuropathy in diseases classified elsewhere: Secondary | ICD-10-CM | POA: Diagnosis not present

## 2021-11-28 DIAGNOSIS — E538 Deficiency of other specified B group vitamins: Secondary | ICD-10-CM

## 2021-11-28 NOTE — Patient Instructions (Signed)
It was good to see you today.  If your symptoms get worse, please come back and see me.

## 2021-12-15 DIAGNOSIS — H04123 Dry eye syndrome of bilateral lacrimal glands: Secondary | ICD-10-CM | POA: Diagnosis not present

## 2021-12-15 DIAGNOSIS — H43813 Vitreous degeneration, bilateral: Secondary | ICD-10-CM | POA: Diagnosis not present

## 2021-12-15 DIAGNOSIS — H11001 Unspecified pterygium of right eye: Secondary | ICD-10-CM | POA: Diagnosis not present

## 2021-12-15 DIAGNOSIS — H26492 Other secondary cataract, left eye: Secondary | ICD-10-CM | POA: Diagnosis not present

## 2021-12-25 DIAGNOSIS — I2510 Atherosclerotic heart disease of native coronary artery without angina pectoris: Secondary | ICD-10-CM | POA: Diagnosis not present

## 2021-12-25 DIAGNOSIS — Z809 Family history of malignant neoplasm, unspecified: Secondary | ICD-10-CM | POA: Diagnosis not present

## 2021-12-25 DIAGNOSIS — Z008 Encounter for other general examination: Secondary | ICD-10-CM | POA: Diagnosis not present

## 2021-12-25 DIAGNOSIS — G629 Polyneuropathy, unspecified: Secondary | ICD-10-CM | POA: Diagnosis not present

## 2021-12-25 DIAGNOSIS — Z823 Family history of stroke: Secondary | ICD-10-CM | POA: Diagnosis not present

## 2021-12-25 DIAGNOSIS — Z8249 Family history of ischemic heart disease and other diseases of the circulatory system: Secondary | ICD-10-CM | POA: Diagnosis not present

## 2021-12-25 DIAGNOSIS — Z5982 Transportation insecurity: Secondary | ICD-10-CM | POA: Diagnosis not present

## 2021-12-25 DIAGNOSIS — I739 Peripheral vascular disease, unspecified: Secondary | ICD-10-CM | POA: Diagnosis not present

## 2021-12-25 DIAGNOSIS — I10 Essential (primary) hypertension: Secondary | ICD-10-CM | POA: Diagnosis not present

## 2021-12-25 DIAGNOSIS — I252 Old myocardial infarction: Secondary | ICD-10-CM | POA: Diagnosis not present

## 2021-12-25 DIAGNOSIS — Z5986 Financial insecurity: Secondary | ICD-10-CM | POA: Diagnosis not present

## 2021-12-25 DIAGNOSIS — Z5912 Inadequate housing utilities: Secondary | ICD-10-CM | POA: Diagnosis not present

## 2021-12-25 DIAGNOSIS — Z8673 Personal history of transient ischemic attack (TIA), and cerebral infarction without residual deficits: Secondary | ICD-10-CM | POA: Diagnosis not present

## 2022-01-06 ENCOUNTER — Telehealth: Payer: Self-pay | Admitting: Cardiology

## 2022-01-06 NOTE — Telephone Encounter (Signed)
Patient states that he got letter in the mail that states that he need to call our office for results. Please advise

## 2022-01-09 NOTE — Telephone Encounter (Signed)
Lm with pt's wife for pt to call back ./cy

## 2022-01-09 NOTE — Telephone Encounter (Signed)
Reviewed pt's chart while speaking with pt and pt was holding results of lower extremity ultrasound results Informed pt that test was not done for our office and to look on form to see if can find another number Pt agreed will further investigate . No record of office calling pt re recent test results /cy

## 2022-01-09 NOTE — Telephone Encounter (Signed)
Patient returned RN's call. 

## 2022-01-14 ENCOUNTER — Other Ambulatory Visit: Payer: Self-pay

## 2022-01-14 ENCOUNTER — Encounter (HOSPITAL_COMMUNITY): Payer: Self-pay | Admitting: Internal Medicine

## 2022-01-14 ENCOUNTER — Emergency Department (HOSPITAL_COMMUNITY): Payer: Medicare HMO

## 2022-01-14 ENCOUNTER — Observation Stay (HOSPITAL_COMMUNITY)
Admission: EM | Admit: 2022-01-14 | Discharge: 2022-01-15 | Disposition: A | Discharge status: Home / Self Care | Payer: Medicare HMO | Attending: Internal Medicine | Admitting: Internal Medicine

## 2022-01-14 ENCOUNTER — Observation Stay (HOSPITAL_COMMUNITY): Payer: Medicare HMO

## 2022-01-14 DIAGNOSIS — T6591XA Toxic effect of unspecified substance, accidental (unintentional), initial encounter: Secondary | ICD-10-CM

## 2022-01-14 DIAGNOSIS — I639 Cerebral infarction, unspecified: Secondary | ICD-10-CM | POA: Diagnosis not present

## 2022-01-14 DIAGNOSIS — R29818 Other symptoms and signs involving the nervous system: Secondary | ICD-10-CM | POA: Diagnosis present

## 2022-01-14 DIAGNOSIS — I2510 Atherosclerotic heart disease of native coronary artery without angina pectoris: Secondary | ICD-10-CM | POA: Diagnosis present

## 2022-01-14 DIAGNOSIS — R55 Syncope and collapse: Secondary | ICD-10-CM | POA: Diagnosis not present

## 2022-01-14 DIAGNOSIS — I10 Essential (primary) hypertension: Secondary | ICD-10-CM | POA: Diagnosis not present

## 2022-01-14 DIAGNOSIS — G4489 Other headache syndrome: Secondary | ICD-10-CM | POA: Diagnosis not present

## 2022-01-14 DIAGNOSIS — Z951 Presence of aortocoronary bypass graft: Secondary | ICD-10-CM | POA: Diagnosis not present

## 2022-01-14 DIAGNOSIS — E7800 Pure hypercholesterolemia, unspecified: Secondary | ICD-10-CM | POA: Diagnosis not present

## 2022-01-14 DIAGNOSIS — R404 Transient alteration of awareness: Secondary | ICD-10-CM | POA: Diagnosis not present

## 2022-01-14 DIAGNOSIS — Z743 Need for continuous supervision: Secondary | ICD-10-CM | POA: Diagnosis not present

## 2022-01-14 DIAGNOSIS — Z7982 Long term (current) use of aspirin: Secondary | ICD-10-CM | POA: Insufficient documentation

## 2022-01-14 DIAGNOSIS — T50991A Poisoning by other drugs, medicaments and biological substances, accidental (unintentional), initial encounter: Secondary | ICD-10-CM | POA: Insufficient documentation

## 2022-01-14 DIAGNOSIS — I6522 Occlusion and stenosis of left carotid artery: Secondary | ICD-10-CM | POA: Diagnosis not present

## 2022-01-14 DIAGNOSIS — G459 Transient cerebral ischemic attack, unspecified: Secondary | ICD-10-CM

## 2022-01-14 DIAGNOSIS — I6523 Occlusion and stenosis of bilateral carotid arteries: Secondary | ICD-10-CM | POA: Diagnosis not present

## 2022-01-14 DIAGNOSIS — R4781 Slurred speech: Secondary | ICD-10-CM | POA: Diagnosis not present

## 2022-01-14 DIAGNOSIS — M25552 Pain in left hip: Secondary | ICD-10-CM | POA: Diagnosis not present

## 2022-01-14 DIAGNOSIS — R29810 Facial weakness: Secondary | ICD-10-CM | POA: Diagnosis not present

## 2022-01-14 DIAGNOSIS — I6529 Occlusion and stenosis of unspecified carotid artery: Secondary | ICD-10-CM | POA: Diagnosis present

## 2022-01-14 DIAGNOSIS — Z79899 Other long term (current) drug therapy: Secondary | ICD-10-CM | POA: Diagnosis not present

## 2022-01-14 DIAGNOSIS — R2990 Unspecified symptoms and signs involving the nervous system: Secondary | ICD-10-CM

## 2022-01-14 DIAGNOSIS — K5010 Crohn's disease of large intestine without complications: Secondary | ICD-10-CM | POA: Diagnosis present

## 2022-01-14 DIAGNOSIS — Z8673 Personal history of transient ischemic attack (TIA), and cerebral infarction without residual deficits: Secondary | ICD-10-CM | POA: Insufficient documentation

## 2022-01-14 LAB — CBC
HCT: 40.9 % (ref 39.0–52.0)
Hemoglobin: 13.8 g/dL (ref 13.0–17.0)
MCH: 30.9 pg (ref 26.0–34.0)
MCHC: 33.7 g/dL (ref 30.0–36.0)
MCV: 91.7 fL (ref 80.0–100.0)
Platelets: 218 10*3/uL (ref 150–400)
RBC: 4.46 MIL/uL (ref 4.22–5.81)
RDW: 12.3 % (ref 11.5–15.5)
WBC: 6.9 10*3/uL (ref 4.0–10.5)
nRBC: 0 % (ref 0.0–0.2)

## 2022-01-14 LAB — I-STAT CHEM 8, ED
BUN: 11 mg/dL (ref 8–23)
Calcium, Ion: 1 mmol/L — ABNORMAL LOW (ref 1.15–1.40)
Chloride: 105 mmol/L (ref 98–111)
Creatinine, Ser: 1.2 mg/dL (ref 0.61–1.24)
Glucose, Bld: 154 mg/dL — ABNORMAL HIGH (ref 70–99)
HCT: 40 % (ref 39.0–52.0)
Hemoglobin: 13.6 g/dL (ref 13.0–17.0)
Potassium: 3.6 mmol/L (ref 3.5–5.1)
Sodium: 141 mmol/L (ref 135–145)
TCO2: 17 mmol/L — ABNORMAL LOW (ref 22–32)

## 2022-01-14 LAB — TROPONIN I (HIGH SENSITIVITY): Troponin I (High Sensitivity): 6 ng/L (ref <18)

## 2022-01-14 LAB — COMPREHENSIVE METABOLIC PANEL
ALT: 18 U/L (ref 0–44)
AST: 26 U/L (ref 15–41)
Albumin: 4 g/dL (ref 3.5–5.0)
Alkaline Phosphatase: 75 U/L (ref 38–126)
Anion gap: 12 (ref 5–15)
BUN: 11 mg/dL (ref 8–23)
CO2: 22 mmol/L (ref 22–32)
Calcium: 8.6 mg/dL — ABNORMAL LOW (ref 8.9–10.3)
Chloride: 107 mmol/L (ref 98–111)
Creatinine, Ser: 1.31 mg/dL — ABNORMAL HIGH (ref 0.61–1.24)
GFR, Estimated: 59 mL/min — ABNORMAL LOW (ref >60)
Glucose, Bld: 155 mg/dL — ABNORMAL HIGH (ref 70–99)
Potassium: 3.6 mmol/L (ref 3.5–5.1)
Sodium: 141 mmol/L (ref 135–145)
Total Bilirubin: 0.4 mg/dL (ref 0.3–1.2)
Total Protein: 7.1 g/dL (ref 6.5–8.1)

## 2022-01-14 LAB — DIFFERENTIAL
Abs Immature Granulocytes: 0.02 10*3/uL (ref 0.00–0.07)
Basophils Absolute: 0.1 10*3/uL (ref 0.0–0.1)
Basophils Relative: 1 %
Eosinophils Absolute: 0.1 10*3/uL (ref 0.0–0.5)
Eosinophils Relative: 2 %
Immature Granulocytes: 0 %
Lymphocytes Relative: 28 %
Lymphs Abs: 1.9 10*3/uL (ref 0.7–4.0)
Monocytes Absolute: 0.5 10*3/uL (ref 0.1–1.0)
Monocytes Relative: 7 %
Neutro Abs: 4.3 10*3/uL (ref 1.7–7.7)
Neutrophils Relative %: 62 %

## 2022-01-14 LAB — MAGNESIUM: Magnesium: 2 mg/dL (ref 1.7–2.4)

## 2022-01-14 LAB — ETHANOL: Alcohol, Ethyl (B): 11 mg/dL — ABNORMAL HIGH (ref <10)

## 2022-01-14 LAB — APTT: aPTT: 24 seconds (ref 24–36)

## 2022-01-14 LAB — PROTIME-INR
INR: 1.1 (ref 0.8–1.2)
Prothrombin Time: 13.9 seconds (ref 11.4–15.2)

## 2022-01-14 LAB — CBG MONITORING, ED: Glucose-Capillary: 150 mg/dL — ABNORMAL HIGH (ref 70–99)

## 2022-01-14 LAB — HIV ANTIBODY (ROUTINE TESTING W REFLEX): HIV Screen 4th Generation wRfx: NONREACTIVE

## 2022-01-14 MED ORDER — STROKE: EARLY STAGES OF RECOVERY BOOK: Freq: Once | Status: AC

## 2022-01-14 MED ORDER — LACTATED RINGERS IV BOLUS
1000.0000 mL | Freq: Once | INTRAVENOUS | Status: AC
  Administered 2022-01-14: 1000 mL via INTRAVENOUS

## 2022-01-14 MED ORDER — IOHEXOL 350 MG/ML SOLN
75.0000 mL | Freq: Once | INTRAVENOUS | Status: AC | PRN
  Administered 2022-01-14: 75 mL via INTRAVENOUS

## 2022-01-14 MED ORDER — ACETAMINOPHEN 160 MG/5ML PO SOLN: 650.0000 mg | ORAL | Status: DC | PRN

## 2022-01-14 MED ORDER — ACETAMINOPHEN 325 MG PO TABS
650.0000 mg | ORAL_TABLET | ORAL | Status: DC | PRN
  Administered 2022-01-14: 650 mg via ORAL
  Filled 2022-01-14: qty 2

## 2022-01-14 MED ORDER — SENNOSIDES-DOCUSATE SODIUM 8.6-50 MG PO TABS: 1.0000 | ORAL_TABLET | Freq: Every evening | ORAL | Status: DC | PRN

## 2022-01-14 MED ORDER — ATORVASTATIN CALCIUM 40 MG PO TABS
40.0000 mg | ORAL_TABLET | Freq: Every day | ORAL | Status: DC
  Administered 2022-01-15: 40 mg via ORAL
  Filled 2022-01-14: qty 1

## 2022-01-14 MED ORDER — ACETAMINOPHEN 650 MG RE SUPP: 650.0000 mg | RECTAL | Status: DC | PRN

## 2022-01-14 MED ORDER — ASPIRIN 81 MG PO TBEC
81.0000 mg | DELAYED_RELEASE_TABLET | Freq: Every day | ORAL | Status: DC
  Administered 2022-01-15: 81 mg via ORAL
  Filled 2022-01-14: qty 1

## 2022-01-14 MED ORDER — SODIUM CHLORIDE 0.9% FLUSH: 3.0000 mL | Freq: Once | INTRAVENOUS | Status: DC

## 2022-01-14 NOTE — Code Documentation (Signed)
Stroke Response Nurse Documentation Code Documentation  MANOJ ENRIQUEZ is a 71 y.o. male arriving to [facility]  via [facility] on 01/14/2022 with past medical hx of dyslipidemia, ulcerative colitis, hypertension, neuropathy, TIA, Dyspnea, carotid artery stenosis, CAD, depression, and myocardial infarction s/p CABG 2005. On aspirin 81 mg daily. Code stroke was activated by EMS.   Patient from home where he was LKW at 1230 and now complaining of dysarthria. EMS was called for an accidental overdose after he accidentally drank Vicks Vaporizer instead of magnesium citrate. He realized it and then  drank magnesium citrate. He had a syncopal episode and another syncopal episode after EMS arrived. Per EMS, he seemed "out of it". He had episodes of emesis with EMS and complained of a sore throat.   Stroke team at the bedside on patient arrival. Labs drawn and patient cleared for CT by EDP. Patient to CT with team. NIHSS 1, see documentation for details and code stroke times. Patient with right facial droop on exam. The following imaging was completed:  CT Head and CTA. Patient is not a candidate for IV Thrombolytic due to stroke not suspected and symptoms  improving. Patient is not a candidate for IR due to no LVO.   Care Plan: Q30 min assessments until out of window at 1500, then Q2 hour. MRI ordered.  Bedside handoff with ED RN [place].    Leverne Humbles Stroke Response RN

## 2022-01-14 NOTE — ED Triage Notes (Signed)
Pt BIB [facility] from home c/o a possible code stroke. Pt takes mag at home to help with bowel movements but today his wife mixed up the bottles and he actually took a sip of vaporizing steam liquid. Pt then had a syncopal episode where he hit his head then when EMS arrived he had another syncopal episode where he hit his face. Afterwards pt seemed to be in a daze and confused and had issues with his speech. Pt does have a slight right facial droop upon arrival to the bridge, speech has improved.

## 2022-01-14 NOTE — H&P (Addendum)
History and Physical   Jacob [name] XIP:[id number] DOB: 04-22-1950 DOA: 01/14/2022  PCP: Leonides Sake, MD   Patient coming from: Home  Chief Complaint: Neurologic deficits  HPI: Jacob [name] is a 71 y.o. male with medical history significant of TIA, carotid artery disease, hyperlipidemia, hypertension, CAD, Graves' disease, bradycardia presenting with strokelike symptoms.  Patient presented as a code stroke.  Earlier today he Mystic his vaporizing solution which was camphor for his magnesium citrate and took a big swig of this.  Noted he had burning in his throat followed by nausea and vomiting.  Subsequently had 2 episodes of syncope where he fell and hit his head both times.  He has had continued nausea and vomiting confusion and speech difficulty.  EMS transported patient to the ED as a code stroke due to neurologic deficits.  Did have some right facial droop and aphasia on arrival which has improved in the ED.  He denies fevers, chills, chest pain, shortness of breath, abdominal pain, constipation, diarrhea.  ED Course: Vital signs in the ED stable.  Lab workup included CMP with creatinine 1.31 which appears to be near most recent baseline, glucose 155, calcium 8.6.  CBC within normal limits.  PT, PTT, INR within normal limits.  Troponin pending.  Ethanol level 11.  Chest x-ray showed no acute abnormality.  CT head showed no acute normality.  CTA head showed no acute abnormality.  CTA demonstrated status post right carotid endarterectomy, 40% left carotid stenosis, vertebral artery with nondominant left vertebral artery showing moderate to severe stenosis.  Magnesium level pending.  MR brain pending.  Neurology consulted recommending TIA/CVA workup versus toxic effect.  Please control contacted in the ED recommended overnight monitoring as he was near toxic levels and this can cause neurologic symptoms.  They also recommend monitoring for hypothermia abdominal pain, throat  pain.  Review of Systems: As per HPI otherwise all other systems reviewed and are negative.  Past Medical History:  Diagnosis Date   CAD (coronary artery disease), native coronary artery     multivessel ASCAD s/p CABG and s/p PCI of SVG to RI 2008   Carotid artery stenosis    s/p Left CEA in 2018.  Stable 1-39% bilateral carotid stenosis by dopplers 12/2019   Crohn's disease ([place])    flare 2000   Depression    Dyslipidemia    Dyspnea    ED (erectile dysfunction)    History of blood transfusion 2005   Hypertension    Myocardial infarction [facility]) 2005   5 bypasses   Nephrolithiasis    Stroke [facility])    TIA- Endartarectemy R side   TIA (transient ischemic attack)    Ulcerative colitis ([place])    Left sided    Past Surgical History:  Procedure Laterality Date   CARDIAC CATHETERIZATION  06/08   Patnet grafts LAD, marginal, distal right   carpel tunnel surgery Left    COLONOSCOPY     COLONOSCOPY WITH PROPOFOL N/A 10/25/2012   Procedure: COLONOSCOPY WITH PROPOFOL;  Surgeon: Garlan Fair, MD;  Location: [facility];  Service: Endoscopy;  Laterality: N/A;   CORONARY ARTERY BYPASS GRAFT  2005   ASCVD,multivessel,S/P CABG  x5 total per patient   ENDARTERECTOMY Right 06/24/2016   Procedure: ENDARTERECTOMY CAROTID;  Surgeon: Algernon Huxley, MD;  Location: [facility] ORS;  Service: Vascular;  Laterality: Right;   HERNIA REPAIR  many years ago   KNEE ARTHROSCOPY Right 1995    Social History  reports that he  has never smoked. His smokeless tobacco use includes snuff. He reports that he does not drink alcohol and does not use drugs.  No Known Allergies  Family History  Problem Relation Age of Onset   Ovarian cancer Mother    CAD Father    CAD Sister    CAD Brother    CAD Brother    CAD Brother    Colon cancer Neg Hx    Esophageal cancer Neg Hx    Stomach cancer Neg Hx    Rectal cancer Neg Hx    Colon polyps Neg Hx   Reviewed on admission  Prior to Admission medications    Medication Sig Start Date End Date Taking? Authorizing Provider  amitriptyline (ELAVIL) 25 MG tablet Take 1 tablet (25 mg total) by mouth at bedtime. 03/02/18   Stoioff, Ronda Fairly, MD  amLODipine (NORVASC) 5 MG tablet Take 1 tablet (5 mg total) by mouth daily. 10/13/21   Elgie Collard, PA-C  anastrozole (ARIMIDEX) 1 MG tablet SMARTSIG:0.5 Tablet(s) By Mouth 3 Times a Week 06/20/21   [provider]  aspirin EC 81 MG tablet Take 81 mg by mouth daily. Swallow whole.    [provider]  atorvastatin (LIPITOR) 40 MG tablet Take 1 tablet (40 mg total) by mouth daily. 10/13/21   Elgie Collard, PA-C  escitalopram (LEXAPRO) 10 MG tablet Take 10 mg by mouth daily. 10/25/16   [provider]  lisinopril (ZESTRIL) 20 MG tablet Take 1 tablet (20 mg total) by mouth daily. 10/13/21   Elgie Collard, PA-C  metoprolol succinate (TOPROL-XL) 25 MG 24 hr tablet Take 0.5 tablets (12.5 mg total) by mouth daily. 10/13/21   Elgie Collard, PA-C  NATESTO 5.5 MG/ACT GEL SMARTSIG:1 Application Both Nares 3 Times Daily 09/15/21   [provider]  nitroGLYCERIN (NITROSTAT) 0.4 MG SL tablet Place 1 tablet (0.4 mg total) under the tongue every 5 (five) minutes as needed for chest pain. 10/13/21   Elgie Collard, PA-C  tamsulosin (FLOMAX) 0.4 MG CAPS capsule Take 0.4 mg by mouth at bedtime. 07/04/21   [provider]  vitamin B-12 (CYANOCOBALAMIN) 500 MCG tablet Take 1,000 mcg by mouth daily.    [provider]    Physical Exam: Vitals:   01/14/22 1600 01/14/22 1630 01/14/22 1700 01/14/22 1714  BP: 132/75 124/72 133/85   Pulse: 66 66 63   Resp: 15 20 (!) 21   Temp:    98.2 F (36.8 C)  SpO2: 96% 92% 93%   Weight:        Physical Exam Constitutional:      General: He is not in acute distress.    Appearance: Normal appearance.  HENT:     Head: Normocephalic and atraumatic.     Mouth/Throat:     Mouth: Mucous membranes are moist.     Pharynx: Oropharynx is clear.  Eyes:      Extraocular Movements: Extraocular movements intact.     Pupils: Pupils are equal, round, and reactive to light.  Cardiovascular:     Rate and Rhythm: Normal rate and regular rhythm.     Pulses: Normal pulses.     Heart sounds: Normal heart sounds.  Pulmonary:     Effort: Pulmonary effort is normal. No respiratory distress.     Breath sounds: Normal breath sounds.  Abdominal:     General: Bowel sounds are normal. There is no distension.     Palpations: Abdomen is soft.  Tenderness: There is no abdominal tenderness.  Musculoskeletal:        General: No swelling or deformity.  Skin:    General: Skin is warm and dry.  Neurological:     Comments: Mental Status: Patient is awake, alert, oriented x3 No signs of aphasia or neglect Cranial Nerves: II: Pupils equal, round, and reactive to light.   III,IV, VI: EOMI without ptosis or diploplia.  V: Facial sensation is symmetric to light touch and  Temperature. VII: Facial movement is symmetric.  VIII: hearing is intact to voice X: Uvula elevates symmetrically XI: Shoulder shrug is symmetric. XII: tongue is midline without atrophy or fasciculations.  Motor: Good effort thorughout, at Least 1-2/5 left lower extremity, 5/5 right lower extremity, 5/5 bilateral upper extremity. Sensory: Sensation is grossly intact bilateral UEs & LEs Cerebellar: Finger-Nose intact bilalat, but mildly slower on right.    Labs on Admission: I have personally reviewed following labs and imaging studies  CBC: Recent Labs  Lab 01/14/22 1445 01/14/22 1453  WBC 6.9  --   NEUTROABS 4.3  --   HGB 13.8 13.6  HCT 40.9 40.0  MCV 91.7  --   PLT 218  --     Basic Metabolic Panel: Recent Labs  Lab 01/14/22 1445 01/14/22 1453  NA 141 141  K 3.6 3.6  CL 107 105  CO2 22  --   GLUCOSE 155* 154*  BUN 11 11  CREATININE 1.31* 1.20  CALCIUM 8.6*  --     GFR: Estimated Creatinine Clearance: 69.7 mL/min (by C-G formula based on SCr of 1.2  mg/dL).  Liver Function Tests: Recent Labs  Lab 01/14/22 1445  AST 26  ALT 18  ALKPHOS 75  BILITOT 0.4  PROT 7.1  ALBUMIN 4.0    Urine analysis:    Component Value Date/Time   COLORURINE YELLOW (A) 09/25/2017 1925   APPEARANCEUR Clear 12/07/2017 1354   LABSPEC 1.014 09/25/2017 1925   PHURINE 6.0 09/25/2017 1925   GLUCOSEU Negative 12/07/2017 1354   HGBUR NEGATIVE 09/25/2017 1925   BILIRUBINUR Negative 12/07/2017 1354   KETONESUR NEGATIVE 09/25/2017 1925   PROTEINUR Negative 12/07/2017 1354   PROTEINUR NEGATIVE 09/25/2017 1925   NITRITE Negative 12/07/2017 1354   NITRITE NEGATIVE 09/25/2017 1925   LEUKOCYTESUR Negative 12/07/2017 1354    Radiological Exams on Admission: DG Chest Port 1 View  Result Date: 01/14/2022 CLINICAL DATA:  Syncope. EXAM: PORTABLE CHEST 1 VIEW COMPARISON:  Chest radiographs 09/25/2017 FINDINGS: Sequelae of CABG are again identified. The cardiomediastinal silhouette is unchanged with normal heart size. There is mild chronic elevation of the left hemidiaphragm. Lung volumes are low without evidence of acute airspace consolidation, edema, sizable pleural effusion, or pneumothorax. No acute osseous abnormality is seen. IMPRESSION: No active disease. Electronically Signed   By: Logan Bores M.D.   On: 01/14/2022 15:55   CT HEAD CODE STROKE WO CONTRAST  Result Date: 01/14/2022 CLINICAL DATA:  Code stroke. Provided history: Neuro deficit, acute, stroke suspected. Slurred speech. EXAM: CT ANGIOGRAPHY HEAD AND NECK TECHNIQUE: Multidetector CT imaging of the head and neck was performed using the standard protocol during bolus administration of intravenous contrast. Multiplanar CT image reconstructions and MIPs were obtained to evaluate the vascular anatomy. Carotid stenosis measurements (when applicable) are obtained utilizing NASCET criteria, using the distal internal carotid diameter as the denominator. RADIATION DOSE REDUCTION: This exam was performed  according to the departmental dose-optimization program which includes automated exposure control, adjustment of the mA and/or kV according to  patient size and/or use of iterative reconstruction technique. CONTRAST:  90m OMNIPAQUE IOHEXOL 350 MG/ML SOLN COMPARISON:  Noncontrast head CT 10/26/2020. CT angiogram head/neck 06/16/2016. FINDINGS: CT HEAD FINDINGS Brain: No age advanced or lobar predominant parenchymal atrophy. Mild patchy and ill-defined hypoattenuation within the cerebral white matter, nonspecific but compatible with chronic small vessel disease. There is no acute intracranial hemorrhage. No demarcated cortical infarct. No extra-axial fluid collection. No evidence of an intracranial mass. No midline shift. Vascular: No hyperdense vessel. Atherosclerotic calcifications. Skull: No fracture or aggressive osseous lesion. Sinuses/Orbits: No mass or acute finding within the imaged orbits. Minimal mucosal thickening within bilateral ethmoid air cells and within the bilateral sphenoid sinuses. Mild mucosal thickening within the inferior right maxillary sinus. Small mucous retention cyst within the left maxillary sinus. ASPECTS ([place] Early CT Score) - Ganglionic level infarction (caudate, lentiform nuclei, internal capsule, insula, M1-M3 cortex): 7 - Supraganglionic infarction (M4-M6 cortex): 3 Total score (0-10 with 10 being normal): 10 Review of the MIP images confirms the above findings These results were communicated to Dr. ARory Percyat 3:08 pmon 12/27/2023by text page via the [facility] Hospitalmessaging system. CTA NECK FINDINGS Aortic arch: Standard aortic branching. The visualized aortic arch is normal in caliber. Streak and beam hardening artifact arising from a dense right-sided contrast bolus partially obscures the right subclavian artery. Within this limitation, there is no appreciable hemodynamically significant innominate or proximal subclavian artery stenosis. Right carotid system: Since the  prior CTA head/neck of 06/16/2016, a right carotid endarterectomy has been performed. CCA and ICA patent within the neck without stenosis. Left carotid system: CCA and ICA patent within the neck. Atherosclerotic plaque about the carotid bifurcation and within the proximal ICA. Resultant 40% stenosis of the proximal ICA, unchanged. Vertebral arteries: The dominant right vertebral artery is patent within the neck without stenosis. The non dominant left vertebral artery is patent within the neck. As before, atherosclerotic plaque results in moderate/severe stenosis at the origin of this vessel. Skeleton: Cervical spondylosis. No acute fracture or aggressive osseous lesion. Other neck: No neck mass or cervical lymphadenopathy. Upper chest: Prior median sternotomy. No consolidation within the imaged lung apices. Review of the MIP images confirms the above findings CTA HEAD FINDINGS Anterior circulation: The intracranial internal carotid arteries are patent. Atherosclerotic plaque within both vessels with no more than mild stenosis. The M1 middle cerebral arteries are patent. No M2 proximal branch occlusion or high-grade proximal stenosis. The anterior cerebral arteries are patent. No intracranial aneurysm is identified. Posterior circulation: The intracranial vertebral arteries are patent. The basilar artery is patent. The posterior cerebral arteries are patent. Posterior communicating arteries are diminutive or absent, bilaterally. Venous sinuses: Within the limitations of contrast timing, no convincing thrombus. Anatomic variants: As described. Review of the MIP images confirms the above findings No emergent large vessel occlusion identified. These results were communicated to Dr. ARory Percyat 3:30 pmon 12/27/2023by text page via the ACh[facility]messaging system. IMPRESSION: CT head: 1. No evidence of acute intracranial abnormality. 2. Mild chronic small vessel ischemic changes within the cerebral white matter. 3. Paranasal  sinus disease, as described. CTA neck: 1. A right carotid endarterectomy has been performed since the prior CTA head/neck of 06/16/2016. The common carotid and internal carotid arteries are patent within the neck without hemodynamically significant stenosis. Unchanged 40% stenosis at the origin of the left ICA. 2. Vertebral arteries patent within the neck. Unchanged moderate-to-severe atherosclerotic narrowing at the origin of the non-dominant left vertebral artery. CTA head: No intracranial  large vessel occlusion or proximal high-grade arterial stenosis identified. Electronically Signed   By: Kellie Simmering D.O.   On: 01/14/2022 15:32   CT ANGIO HEAD NECK W WO CM (CODE STROKE)  Result Date: 01/14/2022 CLINICAL DATA:  Code stroke. Provided history: Neuro deficit, acute, stroke suspected. Slurred speech. EXAM: CT ANGIOGRAPHY HEAD AND NECK TECHNIQUE: Multidetector CT imaging of the head and neck was performed using the standard protocol during bolus administration of intravenous contrast. Multiplanar CT image reconstructions and MIPs were obtained to evaluate the vascular anatomy. Carotid stenosis measurements (when applicable) are obtained utilizing NASCET criteria, using the distal internal carotid diameter as the denominator. RADIATION DOSE REDUCTION: This exam was performed according to the departmental dose-optimization program which includes automated exposure control, adjustment of the mA and/or kV according to patient size and/or use of iterative reconstruction technique. CONTRAST:  64m OMNIPAQUE IOHEXOL 350 MG/ML SOLN COMPARISON:  Noncontrast head CT 10/26/2020. CT angiogram head/neck 06/16/2016. FINDINGS: CT HEAD FINDINGS Brain: No age advanced or lobar predominant parenchymal atrophy. Mild patchy and ill-defined hypoattenuation within the cerebral white matter, nonspecific but compatible with chronic small vessel disease. There is no acute intracranial hemorrhage. No demarcated cortical infarct. No  extra-axial fluid collection. No evidence of an intracranial mass. No midline shift. Vascular: No hyperdense vessel. Atherosclerotic calcifications. Skull: No fracture or aggressive osseous lesion. Sinuses/Orbits: No mass or acute finding within the imaged orbits. Minimal mucosal thickening within bilateral ethmoid air cells and within the bilateral sphenoid sinuses. Mild mucosal thickening within the inferior right maxillary sinus. Small mucous retention cyst within the left maxillary sinus. ASPECTS ([place]roke Program Early CT Score) - Ganglionic level infarction (caudate, lentiform nuclei, internal capsule, insula, M1-M3 cortex): 7 - Supraganglionic infarction (M4-M6 cortex): 3 Total score (0-10 with 10 being normal): 10 Review of the MIP images confirms the above findings These results were communicated to Dr. ARory Percyat 3:08 pmon 12/27/2023by text page via the AG[facility] Associationmessaging system. CTA NECK FINDINGS Aortic arch: Standard aortic branching. The visualized aortic arch is normal in caliber. Streak and beam hardening artifact arising from a dense right-sided contrast bolus partially obscures the right subclavian artery. Within this limitation, there is no appreciable hemodynamically significant innominate or proximal subclavian artery stenosis. Right carotid system: Since the prior CTA head/neck of 06/16/2016, a right carotid endarterectomy has been performed. CCA and ICA patent within the neck without stenosis. Left carotid system: CCA and ICA patent within the neck. Atherosclerotic plaque about the carotid bifurcation and within the proximal ICA. Resultant 40% stenosis of the proximal ICA, unchanged. Vertebral arteries: The dominant right vertebral artery is patent within the neck without stenosis. The non dominant left vertebral artery is patent within the neck. As before, atherosclerotic plaque results in moderate/severe stenosis at the origin of this vessel. Skeleton: Cervical spondylosis. No acute fracture  or aggressive osseous lesion. Other neck: No neck mass or cervical lymphadenopathy. Upper chest: Prior median sternotomy. No consolidation within the imaged lung apices. Review of the MIP images confirms the above findings CTA HEAD FINDINGS Anterior circulation: The intracranial internal carotid arteries are patent. Atherosclerotic plaque within both vessels with no more than mild stenosis. The M1 middle cerebral arteries are patent. No M2 proximal branch occlusion or high-grade proximal stenosis. The anterior cerebral arteries are patent. No intracranial aneurysm is identified. Posterior circulation: The intracranial vertebral arteries are patent. The basilar artery is patent. The posterior cerebral arteries are patent. Posterior communicating arteries are diminutive or absent, bilaterally. Venous sinuses: Within the limitations of  contrast timing, no convincing thrombus. Anatomic variants: As described. Review of the MIP images confirms the above findings No emergent large vessel occlusion identified. These results were communicated to Dr. Rory Percy at 3:30 pmon 12/27/2023by text page via the [facility] messaging system. IMPRESSION: CT head: 1. No evidence of acute intracranial abnormality. 2. Mild chronic small vessel ischemic changes within the cerebral white matter. 3. Paranasal sinus disease, as described. CTA neck: 1. A right carotid endarterectomy has been performed since the prior CTA head/neck of 06/16/2016. The common carotid and internal carotid arteries are patent within the neck without hemodynamically significant stenosis. Unchanged 40% stenosis at the origin of the left ICA. 2. Vertebral arteries patent within the neck. Unchanged moderate-to-severe atherosclerotic narrowing at the origin of the non-dominant left vertebral artery. CTA head: No intracranial large vessel occlusion or proximal high-grade arterial stenosis identified. Electronically Signed   By: Kellie Simmering D.O.   On: 01/14/2022 15:32    EKG:  Independently reviewed.  Sinus rhythm at 67 bpm.  Assessment/Plan Principal Problem:   Acute focal neurological deficit Active Problems:   Coronary atherosclerosis of native coronary artery   Pure hypercholesterolemia   Essential hypertension, benign   Carotid artery stenosis   Crohn's colitis ([place])   Fall/syncope Acute focal neurologic deficit CVA/TIA versus toxic metabolic etiology > Presenting after accidental ingestion of of significant quantity of Camphor when he mistook it for his magnesium citrate. > Following this he had 2 syncopal episodes where he hit his head, nausea, vomiting, confusion. > He also had aphasia and left facial droop and was brought in as a code stroke.  This appears to have resolved in the ED.  Now with some left lower extremity weakness. > His left lower extremity appears to be limited somewhat by pain in addition to being unable to move it.  He states he did fall on it we will check x-ray as below. > Post control contacted in the ED and recommended 24-hour monitoring, states to monitor for hypothermia, throat pain, abdominal pain.  States that he likely ingested near toxic levels and could experience neurologic symptoms from this. > Neurology consulted in the ED and are following as per HPI.  CT head without acute abnormality.  CTA head without acute abnormality.  CTA neck showing known carotid artery disease and moderate to severe obstruction of the nondominant left vertebral artery.  MRI pending.  History of TIA but this appears to be in the setting of carotid endarterectomy. - Appreciate neurology recommendations - Allow for permissive HTN  (systolic < 465 and diastolic < 035)  - Home aspirin and statin - Echocardiogram  - A1C  - Lipid panel  - Tele monitoring  - SLP eval - PT/OT - Follow-up Hip  x-ray  CAD HLD Carotid artery disease > Status post CABG in 2005. - Continue home aspirin, atorvastatin - Holding lisinopril, metoprolol in the setting of  permissive hypertension  Hypertension - Holding home amlodipine, lisinopril, metoprolol in the setting of permissive hypertension as above  Crohn's disease - Noted  DVT prophylaxis: SCDs for now Code Status:   Full Family Communication:  Updated at bedside Disposition Plan:   Patient is from:  Home  Anticipated DC to:  Home  Anticipated DC date:  1 to 3 days  Anticipated DC barriers: None  Consults called:  Neurology Admission status:  Observation, telemetry  Severity of Illness: The appropriate patient status for this patient is OBSERVATION. Observation status is judged to be reasonable and necessary in order  to provide the required intensity of service to ensure the patient's safety. The patient's presenting symptoms, physical exam findings, and initial radiographic and laboratory data in the context of their medical condition is felt to place them at decreased risk for further clinical deterioration. Furthermore, it is anticipated that the patient will be medically stable for discharge from the hospital within 2 midnights of admission.    Marcelyn Bruins MD [facility]  How to contact the [facility] Attending or Consulting provider [place] or covering provider during after hours [facility], for this patient?   Check the care team in [facility] and look for a) attending/consulting [facility] provider listed and b) the [facility] team listed Log into [email or web address] and use [place]'s universal password to access. If you do not have the password, please contact the hospital operator. Locate the [facility] provider you are looking for under [facility] and page to a number that you can be directly reached. If you still have difficulty reaching the provider, please page the [facility] (Director on Call) for the Hospitalists listed on amion for assistance.  01/14/2022, 6:28 PM

## 2022-01-14 NOTE — ED Notes (Signed)
The pt is c/o a sl headache worse with standing to urinate

## 2022-01-14 NOTE — ED Provider Notes (Signed)
[facility] EMERGENCY DEPARTMENT Provider Note   CSN: [id number] Arrival date & time: 01/14/22  1441  An emergency department physician performed an initial assessment on this suspected stroke patient at 23.  History  Chief Complaint  Patient presents with   Code Stroke    Jacob [name] is a 71 y.o. male.  HPI Patient presents for syncope, emesis, and strokelike symptoms.  Medical history includes CAD, HLD, HTN, TIA, Crohn's disease.  Earlier today, patient missed took a bottle of camphor containing vaporizing steam liquid for his magnesium supplement.  He took 1 swig of this.  This did cause a burning sensation in his throat.  He denies any burning sensation in his chest.  He subsequently had an episode of vomiting.  He then had a witnessed syncopal episode.  During this episode, he did fall and strike his head.  EMS was called.  Patient had further vomiting and another syncopal episode that was witnessed by EMS.  During this episode, he had another fall and struck his face.  He had further vomiting.  He had confusion and difficulty with speech.  He arrived as a code stroke.  Currently, patient states that his nausea has resolved.  He does still have sensation of throat irritation.  He denies any abdominal pain.  He does feel that his voice is back to normal and his family member at bedside feels the same.  He does endorse current generalized weakness.  This is the bottle from which she took a drink:      Home Medications Prior to Admission medications   Medication Sig Start Date End Date Taking? Authorizing Provider  amitriptyline (ELAVIL) 25 MG tablet Take 1 tablet (25 mg total) by mouth at bedtime. 03/02/18   Stoioff, Ronda Fairly, MD  amLODipine (NORVASC) 5 MG tablet Take 1 tablet (5 mg total) by mouth daily. 10/13/21   Elgie Collard, PA-C  anastrozole (ARIMIDEX) 1 MG tablet SMARTSIG:0.5 Tablet(s) By Mouth 3 Times a Week 06/20/21   [provider]  aspirin  EC 81 MG tablet Take 81 mg by mouth daily. Swallow whole.    [provider]  atorvastatin (LIPITOR) 40 MG tablet Take 1 tablet (40 mg total) by mouth daily. 10/13/21   Elgie Collard, PA-C  escitalopram (LEXAPRO) 10 MG tablet Take 10 mg by mouth daily. 10/25/16   [provider]  lisinopril (ZESTRIL) 20 MG tablet Take 1 tablet (20 mg total) by mouth daily. 10/13/21   Elgie Collard, PA-C  metoprolol succinate (TOPROL-XL) 25 MG 24 hr tablet Take 0.5 tablets (12.5 mg total) by mouth daily. 10/13/21   Elgie Collard, PA-C  NATESTO 5.5 MG/ACT GEL SMARTSIG:1 Application Both Nares 3 Times Daily 09/15/21   [provider]  nitroGLYCERIN (NITROSTAT) 0.4 MG SL tablet Place 1 tablet (0.4 mg total) under the tongue every 5 (five) minutes as needed for chest pain. 10/13/21   Elgie Collard, PA-C  tamsulosin (FLOMAX) 0.4 MG CAPS capsule Take 0.4 mg by mouth at bedtime. 07/04/21   [provider]  vitamin B-12 (CYANOCOBALAMIN) 500 MCG tablet Take 1,000 mcg by mouth daily.    [provider]      Allergies    Patient has no known allergies.    Review of Systems   Review of Systems  HENT:  Positive for sore throat.   Gastrointestinal:  Positive for nausea and vomiting.  Neurological:  Positive for syncope, speech difficulty and weakness (Generalized).  Psychiatric/Behavioral:  Positive for confusion.   All other systems reviewed and are negative.   Physical Exam Updated Vital Signs BP (!) 149/78   Pulse 70   Temp 98.2 F (36.8 C)   Resp (!) 21   Wt 95.6 kg   SpO2 92%   BMI 28.58 kg/m  Physical Exam Vitals and nursing note reviewed.  Constitutional:      General: He is not in acute distress.    Appearance: Normal appearance. He is well-developed and normal weight. He is not ill-appearing, toxic-appearing or diaphoretic.  HENT:     Head: Normocephalic and atraumatic.     Right Ear: External ear normal.     Left Ear: External ear normal.     Nose: Nose  normal.     Mouth/Throat:     Mouth: Mucous membranes are moist.     Pharynx: Oropharynx is clear. No oropharyngeal exudate or posterior oropharyngeal erythema.  Eyes:     Extraocular Movements: Extraocular movements intact.     Conjunctiva/sclera: Conjunctivae normal.  Cardiovascular:     Rate and Rhythm: Normal rate and regular rhythm.     Heart sounds: No murmur heard. Pulmonary:     Effort: Pulmonary effort is normal. No respiratory distress.     Breath sounds: Normal breath sounds. No wheezing or rales.  Chest:     Chest wall: No tenderness.  Abdominal:     General: There is no distension.     Palpations: Abdomen is soft.     Tenderness: There is no abdominal tenderness.  Musculoskeletal:        General: No swelling. Normal range of motion.     Cervical back: Normal range of motion and neck supple. No rigidity or tenderness.     Right lower leg: No edema.     Left lower leg: No edema.  Skin:    General: Skin is warm and dry.     Capillary Refill: Capillary refill takes less than 2 seconds.     Coloration: Skin is not jaundiced or pale.  Neurological:     General: No focal deficit present.     Mental Status: He is alert and oriented to person, place, and time.     Cranial Nerves: No cranial nerve deficit.     Sensory: No sensory deficit.     Motor: No weakness (global).     Coordination: Coordination normal.  Psychiatric:        Mood and Affect: Mood normal.        Behavior: Behavior normal.        Thought Content: Thought content normal.        Judgment: Judgment normal.     ED Results / Procedures / Treatments   Labs (all labs ordered are listed, but only abnormal results are displayed) Labs Reviewed  COMPREHENSIVE METABOLIC PANEL - Abnormal; Notable for the following components:      Result Value   Glucose, Bld 155 (*)    Creatinine, Ser 1.31 (*)    Calcium 8.6 (*)    GFR, Estimated 59 (*)    All other components within normal limits  ETHANOL - Abnormal;  Notable for the following components:   Alcohol, Ethyl (B) 11 (*)    All other components within normal limits  I-STAT CHEM 8, ED - Abnormal; Notable for the following components:   Glucose, Bld 154 (*)    Calcium, Ion 1.00 (*)    TCO2 17 (*)    All other components within normal  limits  CBG MONITORING, ED - Abnormal; Notable for the following components:   Glucose-Capillary 150 (*)    All other components within normal limits  PROTIME-INR  APTT  CBC  DIFFERENTIAL  MAGNESIUM  HIV ANTIBODY (ROUTINE TESTING W REFLEX)  LIPID PANEL  HEMOGLOBIN A1C  TROPONIN I (HIGH SENSITIVITY)  TROPONIN I (HIGH SENSITIVITY)    EKG EKG Interpretation  Date/Time:  Wednesday January 14 2022 15:05:08 EST Ventricular Rate:  67 PR Interval:  162 QRS Duration: 101 QT Interval:  379 QTC Calculation: 400 R Axis:   68 Text Interpretation: Sinus rhythm Confirmed by Godfrey Pick [phone]) on 01/14/2022 4:31:36 PM  Radiology DG Chest Port 1 View  Result Date: 01/14/2022 CLINICAL DATA:  Syncope. EXAM: PORTABLE CHEST 1 VIEW COMPARISON:  Chest radiographs 09/25/2017 FINDINGS: Sequelae of CABG are again identified. The cardiomediastinal silhouette is unchanged with normal heart size. There is mild chronic elevation of the left hemidiaphragm. Lung volumes are low without evidence of acute airspace consolidation, edema, sizable pleural effusion, or pneumothorax. No acute osseous abnormality is seen. IMPRESSION: No active disease. Electronically Signed   By: Logan Bores M.D.   On: 01/14/2022 15:55   CT HEAD CODE STROKE WO CONTRAST  Result Date: 01/14/2022 CLINICAL DATA:  Code stroke. Provided history: Neuro deficit, acute, stroke suspected. Slurred speech. EXAM: CT ANGIOGRAPHY HEAD AND NECK TECHNIQUE: Multidetector CT imaging of the head and neck was performed using the standard protocol during bolus administration of intravenous contrast. Multiplanar CT image reconstructions and MIPs were obtained to evaluate the  vascular anatomy. Carotid stenosis measurements (when applicable) are obtained utilizing NASCET criteria, using the distal internal carotid diameter as the denominator. RADIATION DOSE REDUCTION: This exam was performed according to the departmental dose-optimization program which includes automated exposure control, adjustment of the mA and/or kV according to patient size and/or use of iterative reconstruction technique. CONTRAST:  58m OMNIPAQUE IOHEXOL 350 MG/ML SOLN COMPARISON:  Noncontrast head CT 10/26/2020. CT angiogram head/neck 06/16/2016. FINDINGS: CT HEAD FINDINGS Brain: No age advanced or lobar predominant parenchymal atrophy. Mild patchy and ill-defined hypoattenuation within the cerebral white matter, nonspecific but compatible with chronic small vessel disease. There is no acute intracranial hemorrhage. No demarcated cortical infarct. No extra-axial fluid collection. No evidence of an intracranial mass. No midline shift. Vascular: No hyperdense vessel. Atherosclerotic calcifications. Skull: No fracture or aggressive osseous lesion. Sinuses/Orbits: No mass or acute finding within the imaged orbits. Minimal mucosal thickening within bilateral ethmoid air cells and within the bilateral sphenoid sinuses. Mild mucosal thickening within the inferior right maxillary sinus. Small mucous retention cyst within the left maxillary sinus. ASPECTS (AMolineStroke Program Early CT Score) - Ganglionic level infarction (caudate, lentiform nuclei, internal capsule, insula, M1-M3 cortex): 7 - Supraganglionic infarction (M4-M6 cortex): 3 Total score (0-10 with 10 being normal): 10 Review of the MIP images confirms the above findings These results were communicated to Dr. ARory Percyat 3:08 pmon 12/27/2023by text page via the [facility]aging system. CTA NECK FINDINGS Aortic arch: Standard aortic branching. The visualized aortic arch is normal in caliber. Streak and beam hardening artifact arising from a dense right-sided  contrast bolus partially obscures the right subclavian artery. Within this limitation, there is no appreciable hemodynamically significant innominate or proximal subclavian artery stenosis. Right carotid system: Since the prior CTA head/neck of 06/16/2016, a right carotid endarterectomy has been performed. CCA and ICA patent within the neck without stenosis. Left carotid system: CCA and ICA patent within the neck. Atherosclerotic plaque about the carotid bifurcation  and within the proximal ICA. Resultant 40% stenosis of the proximal ICA, unchanged. Vertebral arteries: The dominant right vertebral artery is patent within the neck without stenosis. The non dominant left vertebral artery is patent within the neck. As before, atherosclerotic plaque results in moderate/severe stenosis at the origin of this vessel. Skeleton: Cervical spondylosis. No acute fracture or aggressive osseous lesion. Other neck: No neck mass or cervical lymphadenopathy. Upper chest: Prior median sternotomy. No consolidation within the imaged lung apices. Review of the MIP images confirms the above findings CTA HEAD FINDINGS Anterior circulation: The intracranial internal carotid arteries are patent. Atherosclerotic plaque within both vessels with no more than mild stenosis. The M1 middle cerebral arteries are patent. No M2 proximal branch occlusion or high-grade proximal stenosis. The anterior cerebral arteries are patent. No intracranial aneurysm is identified. Posterior circulation: The intracranial vertebral arteries are patent. The basilar artery is patent. The posterior cerebral arteries are patent. Posterior communicating arteries are diminutive or absent, bilaterally. Venous sinuses: Within the limitations of contrast timing, no convincing thrombus. Anatomic variants: As described. Review of the MIP images confirms the above findings No emergent large vessel occlusion identified. These results were communicated to Dr. Rory Percy at 3:30 pmon  12/27/2023by text page via the [facility] messaging system. IMPRESSION: CT head: 1. No evidence of acute intracranial abnormality. 2. Mild chronic small vessel ischemic changes within the cerebral white matter. 3. Paranasal sinus disease, as described. CTA neck: 1. A right carotid endarterectomy has been performed since the prior CTA head/neck of 06/16/2016. The common carotid and internal carotid arteries are patent within the neck without hemodynamically significant stenosis. Unchanged 40% stenosis at the origin of the left ICA. 2. Vertebral arteries patent within the neck. Unchanged moderate-to-severe atherosclerotic narrowing at the origin of the non-dominant left vertebral artery. CTA head: No intracranial large vessel occlusion or proximal high-grade arterial stenosis identified. Electronically Signed   By: Kellie Simmering D.O.   On: 01/14/2022 15:32   CT ANGIO HEAD NECK W WO CM (CODE STROKE)  Result Date: 01/14/2022 CLINICAL DATA:  Code stroke. Provided history: Neuro deficit, acute, stroke suspected. Slurred speech. EXAM: CT ANGIOGRAPHY HEAD AND NECK TECHNIQUE: Multidetector CT imaging of the head and neck was performed using the standard protocol during bolus administration of intravenous contrast. Multiplanar CT image reconstructions and MIPs were obtained to evaluate the vascular anatomy. Carotid stenosis measurements (when applicable) are obtained utilizing NASCET criteria, using the distal internal carotid diameter as the denominator. RADIATION DOSE REDUCTION: This exam was performed according to the departmental dose-optimization program which includes automated exposure control, adjustment of the mA and/or kV according to patient size and/or use of iterative reconstruction technique. CONTRAST:  80m OMNIPAQUE IOHEXOL 350 MG/ML SOLN COMPARISON:  Noncontrast head CT 10/26/2020. CT angiogram head/neck 06/16/2016. FINDINGS: CT HEAD FINDINGS Brain: No age advanced or lobar predominant parenchymal atrophy.  Mild patchy and ill-defined hypoattenuation within the cerebral white matter, nonspecific but compatible with chronic small vessel disease. There is no acute intracranial hemorrhage. No demarcated cortical infarct. No extra-axial fluid collection. No evidence of an intracranial mass. No midline shift. Vascular: No hyperdense vessel. Atherosclerotic calcifications. Skull: No fracture or aggressive osseous lesion. Sinuses/Orbits: No mass or acute finding within the imaged orbits. Minimal mucosal thickening within bilateral ethmoid air cells and within the bilateral sphenoid sinuses. Mild mucosal thickening within the inferior right maxillary sinus. Small mucous retention cyst within the left maxillary sinus. ASPECTS ([facility] Early CT Score) - Ganglionic level infarction (caudate, lentiform nuclei, internal  capsule, insula, M1-M3 cortex): 7 - Supraganglionic infarction (M4-M6 cortex): 3 Total score (0-10 with 10 being normal): 10 Review of the MIP images confirms the above findings These results were communicated to Dr. Rory Percy at 3:08 pmon 12/27/2023by text page via the [facility] messaging system. CTA NECK FINDINGS Aortic arch: Standard aortic branching. The visualized aortic arch is normal in caliber. Streak and beam hardening artifact arising from a dense right-sided contrast bolus partially obscures the right subclavian artery. Within this limitation, there is no appreciable hemodynamically significant innominate or proximal subclavian artery stenosis. Right carotid system: Since the prior CTA head/neck of 06/16/2016, a right carotid endarterectomy has been performed. CCA and ICA patent within the neck without stenosis. Left carotid system: CCA and ICA patent within the neck. Atherosclerotic plaque about the carotid bifurcation and within the proximal ICA. Resultant 40% stenosis of the proximal ICA, unchanged. Vertebral arteries: The dominant right vertebral artery is patent within the neck without  stenosis. The non dominant left vertebral artery is patent within the neck. As before, atherosclerotic plaque results in moderate/severe stenosis at the origin of this vessel. Skeleton: Cervical spondylosis. No acute fracture or aggressive osseous lesion. Other neck: No neck mass or cervical lymphadenopathy. Upper chest: Prior median sternotomy. No consolidation within the imaged lung apices. Review of the MIP images confirms the above findings CTA HEAD FINDINGS Anterior circulation: The intracranial internal carotid arteries are patent. Atherosclerotic plaque within both vessels with no more than mild stenosis. The M1 middle cerebral arteries are patent. No M2 proximal branch occlusion or high-grade proximal stenosis. The anterior cerebral arteries are patent. No intracranial aneurysm is identified. Posterior circulation: The intracranial vertebral arteries are patent. The basilar artery is patent. The posterior cerebral arteries are patent. Posterior communicating arteries are diminutive or absent, bilaterally. Venous sinuses: Within the limitations of contrast timing, no convincing thrombus. Anatomic variants: As described. Review of the MIP images confirms the above findings No emergent large vessel occlusion identified. These results were communicated to Dr. Rory Percy at 3:30 pmon 12/27/2023by text page via the [facility] messaging system. IMPRESSION: CT head: 1. No evidence of acute intracranial abnormality. 2. Mild chronic small vessel ischemic changes within the cerebral white matter. 3. Paranasal sinus disease, as described. CTA neck: 1. A right carotid endarterectomy has been performed since the prior CTA head/neck of 06/16/2016. The common carotid and internal carotid arteries are patent within the neck without hemodynamically significant stenosis. Unchanged 40% stenosis at the origin of the left ICA. 2. Vertebral arteries patent within the neck. Unchanged moderate-to-severe atherosclerotic narrowing at the origin  of the non-dominant left vertebral artery. CTA head: No intracranial large vessel occlusion or proximal high-grade arterial stenosis identified. Electronically Signed   By: Kellie Simmering D.O.   On: 01/14/2022 15:32    Procedures Procedures    Medications Ordered in ED Medications  sodium chloride flush (NS) 0.9 % injection 3 mL (has no administration in time range)  aspirin EC tablet 81 mg (has no administration in time range)  atorvastatin (LIPITOR) tablet 40 mg (has no administration in time range)   stroke: early stages of recovery book (has no administration in time range)  acetaminophen (TYLENOL) tablet 650 mg (has no administration in time range)    Or  acetaminophen (TYLENOL) 160 MG/5ML solution 650 mg (has no administration in time range)    Or  acetaminophen (TYLENOL) suppository 650 mg (has no administration in time range)  senna-docusate (Senokot-S) tablet 1 tablet (has no administration in time range)  iohexol (  OMNIPAQUE) 350 MG/ML injection 75 mL (75 mLs Intravenous Contrast Given 01/14/22 1458)  lactated ringers bolus 1,000 mL (0 mLs Intravenous Stopped 01/14/22 1801)    ED Course/ Medical Decision Making/ A&P                           Medical Decision Making Amount and/or Complexity of Data Reviewed Labs: ordered. Radiology: ordered.  Risk Decision regarding hospitalization.   This patient presents to the ED for concern of strokelike symptoms, this involves an extensive number of treatment options, and is a complaint that carries with it a high risk of complications and morbidity.  The differential diagnosis includes CVA, TIA, seizure, toxic effects from accidental ingestion   Co morbidities that complicate the patient evaluation  CAD, HLD, HTN, TIA, Crohn's disease   Additional history obtained:  Additional history obtained from patient's family External records from outside source obtained and reviewed including EMR   Lab Tests:  I Ordered, and  personally interpreted labs.  The pertinent results include: Normal hemoglobin, no leukocytosis, baseline CKD, normal electrolytes   Imaging Studies ordered:  I ordered imaging studies including CT head, CTA head and neck, chest x-ray I independently visualized and interpreted imaging which showed no acute findings I agree with the radiologist interpretation   Cardiac Monitoring: / EKG:  The patient was maintained on a cardiac monitor.  I personally viewed and interpreted the cardiac monitored which showed an underlying rhythm of: Sinus rhythm   Consultations Obtained:  I requested consultation with the neurologist, Dr. Rory Percy,  and discussed lab and imaging findings as well as pertinent plan - they recommend: Admission for TIA workup   Problem List / ED Course / Critical interventions / Medication management  Patient arrives as a code stroke.  He had multiple episodes of vomiting and syncope prior to arrival.  This followed an accidental ingestion of a camphor containing vaporizing steam liquid.  He was noted to have confusion and speech difficulty.  Patient was taken directly to [facility].  CT scans showed no acute findings.  On return from [facility], patient feels back to his baseline other than some fatigue and generalized weakness.  He states that his speech is baseline and this is confirmed with family member.  He denies any current nausea.  He still has mild throat irritation.  On direct inspection of oropharynx, there is no erythema or swelling.  Patient is tolerating his own secretions.  In addition to stroke workup, patient also to undergo cardiac monitoring and lab work, due to his episode of syncope.  IV fluids were ordered.  Lab work shows baseline creatinine, normal electrolytes, no anemia and no leukocytosis.  Patient is currently awaiting MRI.  I spoke with neurology who did recognize some facial droop on arrival which has since resolved.  They do recommend admission for TIA  workup.  I spoke with poison control regarding his accidental ingestion.  They state that, given he was symptomatic from they recommended 24-hour observation.  Side effects to be aware of our irritation, abdominal pain, neurologic symptoms, seizures, hypothermia.  It can cause liver injury and they do recommend obtaining CMP.  He did get a CMP on arrival which showed normal hepatobiliary enzymes.  On reassessment, patient continues to rest comfortably.  Patient was admitted to medicine for further management.   Social Determinants of Health:  Lives independently        Final Clinical Impression(s) / ED Diagnoses Final  diagnoses:  Stroke-like symptoms  Accidental ingestion of substance, initial encounter    Rx / DC Orders ED Discharge Orders     None         Godfrey Pick, MD 01/14/22 [phone]

## 2022-01-14 NOTE — Consult Note (Signed)
Neurology Consultation  Reason for Consult: Code Stroke Referring Physician: Rancour  CC: Code stroke- dysarthria, right gaze preference  History is obtained from:EMS  HPI: Jacob [name] is a 71 y.o. male with CAD, TIA and carotid artery stenosis with left CEA in 2018, Crohn's disease, dyslipidemia, dyspnea, and HTN presenting with dysarthria. EMS was initially called for an accidental overdose when he accidentally drank Vick's Vaporizer (Camphor) instead of magnesium citrate. He did take the magnesium citrate after. He then had two syncopal episodes, the second of which was witnessed by EMS. He did hit his head. After he "came to" EMS stated he was "out of it." He did have multiple episodes of emesis with EMS. Upon arrival to the ED blood pressure was systolic 993T and CBG was 150. After CT speech has improved significantly. He is able to tell me that he has had issues with constipation in relation to his Crohn's disease for years. He has not had any recent illness, dizziness, nausea, or vomiting aside from today. He denies any falls other than the ones that happened today.    LKW: 50 tpa given?: no, stroke not suspected Premorbid modified Rankin scale (mRS):  0-Completely asymptomatic and back to baseline post-stroke  ROS: Full ROS was performed and is negative except as noted in the HPI.   Past Medical History:  Diagnosis Date   CAD (coronary artery disease), native coronary artery     multivessel ASCAD s/p CABG and s/p PCI of SVG to RI 2008   Carotid artery stenosis    s/p Left CEA in 2018.  Stable 1-39% bilateral carotid stenosis by dopplers 12/2019   Crohn's disease ([place])    flare 2000   Depression    Dyslipidemia    Dyspnea    ED (erectile dysfunction)    History of blood transfusion 2005   Hypertension    Myocardial infarction [facility]) 2005   5 bypasses   Nephrolithiasis    [place]    TIA- Endartarectemy R side   TIA (transient ischemic attack)    Ulcerative  colitis ([place])    Left sided      Family History  Problem Relation Age of Onset   Ovarian cancer Mother    CAD Father    CAD Sister    CAD Brother    CAD Brother    CAD Brother    Colon cancer Neg Hx    Esophageal cancer Neg Hx    Stomach cancer Neg Hx    Rectal cancer Neg Hx    Colon polyps Neg Hx      Social History:   reports that he has never smoked. His smokeless tobacco use includes snuff. He reports that he does not drink alcohol and does not use drugs.  Medications  Current Facility-Administered Medications:    0.9 %  sodium chloride infusion, 500 mL, Intravenous, Once, Armbruster, Carlota Raspberry, MD   0.9 %  sodium chloride infusion, 500 mL, Intravenous, Once, Cirigliano, Vito V, DO   sodium chloride flush (NS) 0.9 % injection 3 mL, 3 mL, Intravenous, Once, Godfrey Pick, MD  Current Outpatient Medications:    amitriptyline (ELAVIL) 25 MG tablet, Take 1 tablet (25 mg total) by mouth at bedtime., Disp: 90 tablet, Rfl: 3   amLODipine (NORVASC) 5 MG tablet, Take 1 tablet (5 mg total) by mouth daily., Disp: 90 tablet, Rfl: 3   anastrozole (ARIMIDEX) 1 MG tablet, SMARTSIG:0.5 Tablet(s) By Mouth 3 Times a Week, Disp: , Rfl:  aspirin EC 81 MG tablet, Take 81 mg by mouth daily. Swallow whole., Disp: , Rfl:    atorvastatin (LIPITOR) 40 MG tablet, Take 1 tablet (40 mg total) by mouth daily., Disp: 90 tablet, Rfl: 3   escitalopram (LEXAPRO) 10 MG tablet, Take 10 mg by mouth daily., Disp: , Rfl:    lisinopril (ZESTRIL) 20 MG tablet, Take 1 tablet (20 mg total) by mouth daily., Disp: 90 tablet, Rfl: 3   metoprolol succinate (TOPROL-XL) 25 MG 24 hr tablet, Take 0.5 tablets (12.5 mg total) by mouth daily., Disp: 90 tablet, Rfl: 3   NATESTO 5.5 MG/ACT GEL, SMARTSIG:1 Application Both Nares 3 Times Daily, Disp: , Rfl:    nitroGLYCERIN (NITROSTAT) 0.4 MG SL tablet, Place 1 tablet (0.4 mg total) under the tongue every 5 (five) minutes as needed for chest pain., Disp: 25 tablet, Rfl: 5    tamsulosin (FLOMAX) 0.4 MG CAPS capsule, Take 0.4 mg by mouth at bedtime., Disp: , Rfl:    vitamin B-12 (CYANOCOBALAMIN) 500 MCG tablet, Take 1,000 mcg by mouth daily., Disp: , Rfl:    Exam: Current vital signs: BP 133/85   Pulse 63   Temp 98.2 F (36.8 C)   Resp (!) 21   Wt 95.6 kg   SpO2 93%   BMI 28.58 kg/m  Vital signs in last 24 hours: Temp:  [98.2 F (36.8 C)-98.4 F (36.9 C)] 98.2 F (36.8 C) (12/27 1714) Pulse Rate:  [63-67] 63 (12/27 1700) Resp:  [15-21] 21 (12/27 1700) BP: (124-136)/(72-85) 133/85 (12/27 1700) SpO2:  [92 %-96 %] 93 % (12/27 1700) Weight:  [95.6 kg] 95.6 kg (12/27 1400) GENERAL: Awake, alert in NAD HEENT: - Normocephalic and atraumatic, dry mm, no LN++, no Thyromegally LUNGS - Clear to auscultation bilaterally with no wheezes CV - S1S2 RRR, no m/r/g, equal pulses bilaterally. ABDOMEN - Soft, nontender, nondistended with normoactive BS Ext: warm, well perfused, intact peripheral pulses, no edema NEURO:  Mental Status: AA&Ox3  Language: speech is slightly dysarthric.  Naming, repetition, fluency, and comprehension intact. Cranial Nerves: PERRL mm/brisk. EOMI, visual fields full, slight asymmetric smile, facial sensation intact, hearing intact, tongue/uvula/soft palate midline, normal sternocleidomastoid and trapezius muscle strength. No evidence of tongue atrophy or fasciculations Motor:  RUE 5/5  LUE 5/5 RLE 5/5  LLE 5/5 Tone: is normal and bulk is normal Sensation- Intact to light touch bilaterally Coordination: FTN intact bilaterally, no ataxia in BLE. Gait- deferred   NIHSS components Score: Comment  1a Level of Conscious 0_0  1_1  2_2  3_3         1b LOC Questions 0_4  1_5  2_6           1c LOC Commands 0_7  1_8  2_9           2 Best Gaze 0_10  1_11  2_12           3 Visual 0_13  1_14  2_15  3_16         4 Facial Palsy 0_17  1_18  2_19  3_20         5a Motor Arm - left 0_21  1_22  2_23  3_24  4_25  UN_26     5b Motor Arm - Right 0_27  1_28  2_29  3_30  4_31  UN_32     6a Motor Leg -  Left 0_33  1_34  2_35  3_36  4_37  UN_38     6b Motor Leg - Right 0_39  1_40  2_41  3_42  4_43  UN_44     7 Limb Ataxia 0_45  1_46  2_47  3_48  UN_49       8 Sensory 0_50  1_51  2_52  UN_53         9 Best Language 0_54  1_55  2_56   3_0         10 Dysarthria 0_1  1_2  2_3  UN_4         11 Extinct. and Inattention 0_5  1_6  2_7           TOTAL: 2        Labs I have reviewed labs in epic and the results pertinent to this consultation are:  CBC    Component Value Date/Time   WBC 6.9 01/14/2022 1445   RBC 4.46 01/14/2022 1445   HGB 13.6 01/14/2022 1453   HCT 40.0 01/14/2022 1453   PLT 218 01/14/2022 1445   MCV 91.7 01/14/2022 1445   MCH 30.9 01/14/2022 1445   MCHC 33.7 01/14/2022 1445   RDW 12.3 01/14/2022 1445   LYMPHSABS 1.9 01/14/2022 1445   MONOABS 0.5 01/14/2022 1445   EOSABS 0.1 01/14/2022 1445   BASOSABS 0.1 01/14/2022 1445    CMP     Component Value Date/Time   NA 141 01/14/2022 1453   NA 139 06/07/2019 0925   K 3.6 01/14/2022 1453   CL 105 01/14/2022 1453   CO2 22 01/14/2022 1445   GLUCOSE 154 (H) 01/14/2022 1453   BUN 11 01/14/2022 1453   BUN 10 06/07/2019 0925   CREATININE 1.20 01/14/2022 1453   CREATININE 1.15 11/07/2015 0903   CALCIUM 8.6 (L) 01/14/2022 1445   PROT 7.1 01/14/2022 1445   PROT 7.2 06/07/2019 0925   ALBUMIN 4.0 01/14/2022 1445   ALBUMIN 4.4 06/07/2019 0925   AST 26 01/14/2022 1445   ALT 18 01/14/2022 1445   ALKPHOS 75 01/14/2022 1445   BILITOT 0.4 01/14/2022 1445   BILITOT 0.4 06/07/2019 0925   GFRNONAA 59 (L) 01/14/2022 1445   GFRAA 66 06/07/2019 0925    Lipid Panel     Component Value Date/Time   CHOL 126 06/07/2019 0925   TRIG 101 06/07/2019 0925   HDL 40 06/07/2019 0925   CHOLHDL 3.2 06/07/2019 0925   CHOLHDL 3.3 06/17/2016 0523   VLDL 22 06/17/2016 0523   LDLCALC 67 06/07/2019 0925   LDLDIRECT 71 01/24/2013 0914     Imaging I have reviewed the images obtained:  CT-head 1. No evidence of acute intracranial abnormality. 2. Mild chronic small vessel ischemic  changes within the cerebral white matter. 3. Paranasal sinus disease, as described.  CTA head and neck  No intracranial large vessel occlusion or proximal high-grade arterial stenosis identified. 1. A right carotid endarterectomy has been performed since the prior CTA head/neck of 06/16/2016. The common carotid and internal carotid arteries are patent within the neck without hemodynamically significant stenosis. Unchanged 40% stenosis at the origin of the left ICA. 2. Vertebral arteries patent within the neck. Unchanged moderate-to-severe atherosclerotic narrowing at the origin of the non-dominant left vertebral artery.  Assessment:   71 y.o. male with CAD, TIA and carotid artery stenosis with left CEA in 2018, Crohn's disease, dyslipidemia, dyspnea, and HTN presenting with dysarthria and right facial droop after accidentally ingesting Vick's vaporizer instead of magnesium for constipation. He had two syncopal episodes with head trauma and multiple episodes of emesis en route to the ED. It is reasonable to admit him for a stroke/TIA work up and encephalopathy evaluation given his presentation. Plan to admit to hospitalist service. Close neurological monitoring while he remains in the TNK window till 1700 hrs.  Impression: Toxic metabolic encephalopathy vs stroke/TIA   Recommendations: - HgbA1c, fasting lipid panel - MRI of the brain without contrast - Frequent neuro checks - Echocardiogram - Carotid dopplers - Prophylactic therapy-Antiplatelet  med: Aspirin - dose 83m PO or 3055mPR - Risk factor modification - Telemetry monitoring - PT consult, OT consult, Speech consult -Check TSH, B12, ammonia levels.  Patient seen and examined by NP/APP with MD. MD to update note as needed.   DeJanine OresDNP, FNP-BC [facility] Pager: [phone] Neurohospitalist Addendum Patient seen and examined with APP/Resident. Agree with the history and physical as documented  above. Agree with the plan as documented, which I helped formulate. I have independently reviewed the chart, obtained history, review of systems and examined the patient.I have personally reviewed pertinent head/neck/spine imaging (CT/MRI).  Patient seen as code stroke.  Came in after accidental ingestion of camphor which is the active ingredient of the Vicks vapor rub liquid-was intending to drink a clear Mac solution.  Appeared spaced out and also appeared to have difficulty with words as well as subtle right facial droop.  On examination in the emergency room, initial NIH was 2.  He had mild right facial droop and mild dysarthria.  Symptoms rapidly improved.  Has risk factors including age, for which I would recommend workup for stroke/TIA risk factors versus toxic metabolic encephalopathy from accidental camphor ingestion.  Recommendations as above.  We will follow the test results with you.  Plan was discussed with the ED provider.  Please feel free to call with any questions.  -- AsAmie PortlandMD Neurologist [facility] Pager: [phone]

## 2022-01-14 NOTE — ED Notes (Signed)
Admitting doctor at  the bedside the pt is c/o pain in his lt thigh  he fell onto his lt side and he has had pain there since the fall but his pain is worse now  and ha is having more difficulty with movement

## 2022-01-14 NOTE — ED Notes (Signed)
Still has a headache but its better

## 2022-01-14 NOTE — ED Notes (Signed)
To mri 

## 2022-01-14 NOTE — ED Notes (Signed)
Son rodneys number [phone]

## 2022-01-14 NOTE — ED Notes (Signed)
The pt returned from mri  sl headache  more pain and soreness in his lt hip  no xray yet  dinner tray  here

## 2022-01-14 NOTE — ED Notes (Signed)
The pt passed the swallow screen

## 2022-01-15 ENCOUNTER — Observation Stay (HOSPITAL_BASED_OUTPATIENT_CLINIC_OR_DEPARTMENT_OTHER): Payer: Medicare HMO

## 2022-01-15 DIAGNOSIS — R29818 Other symptoms and signs involving the nervous system: Secondary | ICD-10-CM

## 2022-01-15 DIAGNOSIS — G459 Transient cerebral ischemic attack, unspecified: Secondary | ICD-10-CM | POA: Diagnosis not present

## 2022-01-15 LAB — LIPID PANEL
Cholesterol: 155 mg/dL (ref 0–200)
HDL: 38 mg/dL — ABNORMAL LOW (ref >40)
LDL Cholesterol: 98 mg/dL (ref 0–99)
Total CHOL/HDL Ratio: 4.1 RATIO
Triglycerides: 95 mg/dL (ref <150)
VLDL: 19 mg/dL (ref 0–40)

## 2022-01-15 LAB — ECHOCARDIOGRAM COMPLETE
Area-P 1/2: 2.87 cm2
Calc EF: 59.4 %
Height: 72 in
S' Lateral: 3.6 cm
Single Plane A2C EF: 63.9 %
Single Plane A4C EF: 57.3 %
Weight: 3372.16 oz

## 2022-01-15 NOTE — Progress Notes (Signed)
STROKE TEAM PROGRESS NOTE   INTERVAL HISTORY Patient is seen in his room with no family at the bedside.  Yesterday, he accidentally drank Vicks vaporizer solution containing camphor instead of the magnesium citrate he had intended to drink.  Patient called poison control and was told to call EMS if he had a syncopal episode or vomited.  He then had 2 syncopal episodes and did hit his head upon falling 1 time as well as some nausea and vomiting.  He called EMS and was brought to the ED.  At that time, he did have some dysarthria, but he states his speech is back to baseline now.  Vitals:   01/15/22 0230 01/15/22 0330 01/15/22 0515 01/15/22 0800  BP:  124/68 124/73 132/71  Pulse: 64 60 63 61  Resp: 16 18 16 18   Temp:    98 F (36.7 C)  TempSrc:    Oral  SpO2: 94% 94% 92% 94%  Weight:      Height:       CBC:  Recent Labs  Lab 01/14/22 1445 01/14/22 1453  WBC 6.9  --   NEUTROABS 4.3  --   HGB 13.8 13.6  HCT 40.9 40.0  MCV 91.7  --   PLT 218  --    Basic Metabolic Panel:  Recent Labs  Lab 01/14/22 1445 01/14/22 1453 01/14/22 1718  NA 141 141  --   K 3.6 3.6  --   CL 107 105  --   CO2 22  --   --   GLUCOSE 155* 154*  --   BUN 11 11  --   CREATININE 1.31* 1.20  --   CALCIUM 8.6*  --   --   MG  --   --  2.0   Lipid Panel:  Recent Labs  Lab 01/15/22 0334  CHOL 155  TRIG 95  HDL 38*  CHOLHDL 4.1  VLDL 19  LDLCALC 98   HgbA1c: No results for input(s): "HGBA1C" in the last 168 hours. Urine Drug Screen: No results for input(s): "LABOPIA", "COCAINSCRNUR", "LABBENZ", "AMPHETMU", "THCU", "LABBARB" in the last 168 hours.  Alcohol Level  Recent Labs  Lab 01/14/22 1445  ETH 11*    IMAGING past 24 hours MR BRAIN WO CONTRAST  Result Date: 01/14/2022 CLINICAL DATA:  Follow-up examination for acute stroke. EXAM: MRI HEAD WITHOUT CONTRAST TECHNIQUE: Multiplanar, multiecho pulse sequences of the brain and surrounding structures were obtained without intravenous contrast.  COMPARISON:  Prior CT from earlier the same day. FINDINGS: Brain: Cerebral volume within normal limits. Scattered patchy T2/FLAIR hyperintensity involving the supratentorial cerebral white matter, most likely related to chronic microvascular ischemic disease, mild for age. No evidence for acute or subacute ischemia. Gray-white matter differentiation maintained. No areas of chronic cortical infarction. No acute or chronic intracranial blood products. No mass lesion, midline shift or mass effect. No hydrocephalus or extra-axial fluid collection. Pituitary gland and suprasellar region within normal limits. Vascular: Major intracranial vascular flow voids are maintained. Skull and upper cervical spine: Craniocervical junction within normal limits. Bone marrow signal intensity normal. No scalp soft tissue abnormality. Sinuses/Orbits: Prior bilateral ocular lens replacement. Mild scattered mucosal thickening noted about the ethmoidal air cells and maxillary sinuses. Superimposed small left maxillary sinus retention cyst. Mastoid air cells are clear. Other: None. IMPRESSION: 1. No acute intracranial abnormality. 2. Mild chronic microvascular ischemic disease for age. Electronically Signed   By: Jeannine Boga M.D.   On: 01/14/2022 21:46   DG HIP UNILAT WITH PELVIS  2-3 VIEWS LEFT  Result Date: 01/14/2022 CLINICAL DATA:  Left hip pain after syncopal episode EXAM: DG HIP (WITH OR WITHOUT PELVIS) 2-3V LEFT COMPARISON:  CT pelvis 02/11/2018 FINDINGS: Contrast medium is present in the urinary bladder. Mild acetabular spurring, left greater than right. Vascular calcifications noted in the antrum and pelvis. Transitional S1 vertebra. No hip fracture identified. IMPRESSION: 1. No hip fracture identified. 2. Mild acetabular spurring, left greater than right. 3. Transitional S1 vertebra. Electronically Signed   By: Van Clines M.D.   On: 01/14/2022 20:15   DG Chest Port 1 View  Result Date: 01/14/2022 CLINICAL  DATA:  Syncope. EXAM: PORTABLE CHEST 1 VIEW COMPARISON:  Chest radiographs 09/25/2017 FINDINGS: Sequelae of CABG are again identified. The cardiomediastinal silhouette is unchanged with normal heart size. There is mild chronic elevation of the left hemidiaphragm. Lung volumes are low without evidence of acute airspace consolidation, edema, sizable pleural effusion, or pneumothorax. No acute osseous abnormality is seen. IMPRESSION: No active disease. Electronically Signed   By: Logan Bores M.D.   On: 01/14/2022 15:55   CT HEAD CODE STROKE WO CONTRAST  Result Date: 01/14/2022 CLINICAL DATA:  Code stroke. Provided history: Neuro deficit, acute, stroke suspected. Slurred speech. EXAM: CT ANGIOGRAPHY HEAD AND NECK TECHNIQUE: Multidetector CT imaging of the head and neck was performed using the standard protocol during bolus administration of intravenous contrast. Multiplanar CT image reconstructions and MIPs were obtained to evaluate the vascular anatomy. Carotid stenosis measurements (when applicable) are obtained utilizing NASCET criteria, using the distal internal carotid diameter as the denominator. RADIATION DOSE REDUCTION: This exam was performed according to the departmental dose-optimization program which includes automated exposure control, adjustment of the mA and/or kV according to patient size and/or use of iterative reconstruction technique. CONTRAST:  52m OMNIPAQUE IOHEXOL 350 MG/ML SOLN COMPARISON:  Noncontrast head CT 10/26/2020. CT angiogram head/neck 06/16/2016. FINDINGS: CT HEAD FINDINGS Brain: No age advanced or lobar predominant parenchymal atrophy. Mild patchy and ill-defined hypoattenuation within the cerebral white matter, nonspecific but compatible with chronic small vessel disease. There is no acute intracranial hemorrhage. No demarcated cortical infarct. No extra-axial fluid collection. No evidence of an intracranial mass. No midline shift. Vascular: No hyperdense vessel. Atherosclerotic  calcifications. Skull: No fracture or aggressive osseous lesion. Sinuses/Orbits: No mass or acute finding within the imaged orbits. Minimal mucosal thickening within bilateral ethmoid air cells and within the bilateral sphenoid sinuses. Mild mucosal thickening within the inferior right maxillary sinus. Small mucous retention cyst within the left maxillary sinus. ASPECTS ([place] Score) - Ganglionic level infarction (caudate, lentiform nuclei, internal capsule, insula, M1-M3 cortex): 7 - Supraganglionic infarction (M4-M6 cortex): 3 Total score (0-10 with 10 being normal): 10 Review of the MIP images confirms the above findings These results were communicated to Dr. ARory Percyat 3:08 pmon 12/27/2023by text page via the [facility] system. CTA NECK FINDINGS Aortic arch: Standard aortic branching. The visualized aortic arch is normal in caliber. Streak and beam hardening artifact arising from a dense right-sided contrast bolus partially obscures the right subclavian artery. Within this limitation, there is no appreciable hemodynamically significant innominate or proximal subclavian artery stenosis. Right carotid system: Since the prior CTA head/neck of 06/16/2016, a right carotid endarterectomy has been performed. CCA and ICA patent within the neck without stenosis. Left carotid system: CCA and ICA patent within the neck. Atherosclerotic plaque about the carotid bifurcation and within the proximal ICA. Resultant 40% stenosis of the proximal ICA, unchanged. Vertebral arteries: The dominant  right vertebral artery is patent within the neck without stenosis. The non dominant left vertebral artery is patent within the neck. As before, atherosclerotic plaque results in moderate/severe stenosis at the origin of this vessel. Skeleton: Cervical spondylosis. No acute fracture or aggressive osseous lesion. Other neck: No neck mass or cervical lymphadenopathy. Upper chest: Prior median sternotomy. No  consolidation within the imaged lung apices. Review of the MIP images confirms the above findings CTA HEAD FINDINGS Anterior circulation: The intracranial internal carotid arteries are patent. Atherosclerotic plaque within both vessels with no more than mild stenosis. The M1 middle cerebral arteries are patent. No M2 proximal branch occlusion or high-grade proximal stenosis. The anterior cerebral arteries are patent. No intracranial aneurysm is identified. Posterior circulation: The intracranial vertebral arteries are patent. The basilar artery is patent. The posterior cerebral arteries are patent. Posterior communicating arteries are diminutive or absent, bilaterally. Venous sinuses: Within the limitations of contrast timing, no convincing thrombus. Anatomic variants: As described. Review of the MIP images confirms the above findings No emergent large vessel occlusion identified. These results were communicated to Dr. Rory Percy at 3:30 pmon 12/27/2023by text page via the [facility] messaging system. IMPRESSION: CT head: 1. No evidence of acute intracranial abnormality. 2. Mild chronic small vessel ischemic changes within the cerebral white matter. 3. Paranasal sinus disease, as described. CTA neck: 1. A right carotid endarterectomy has been performed since the prior CTA head/neck of 06/16/2016. The common carotid and internal carotid arteries are patent within the neck without hemodynamically significant stenosis. Unchanged 40% stenosis at the origin of the left ICA. 2. Vertebral arteries patent within the neck. Unchanged moderate-to-severe atherosclerotic narrowing at the origin of the non-dominant left vertebral artery. CTA head: No intracranial large vessel occlusion or proximal high-grade arterial stenosis identified. Electronically Signed   By: Kellie Simmering D.O.   On: 01/14/2022 15:32   CT ANGIO HEAD NECK W WO CM (CODE STROKE)  Result Date: 01/14/2022 CLINICAL DATA:  Code stroke. Provided history: Neuro deficit,  acute, stroke suspected. Slurred speech. EXAM: CT ANGIOGRAPHY HEAD AND NECK TECHNIQUE: Multidetector CT imaging of the head and neck was performed using the standard protocol during bolus administration of intravenous contrast. Multiplanar CT image reconstructions and MIPs were obtained to evaluate the vascular anatomy. Carotid stenosis measurements (when applicable) are obtained utilizing NASCET criteria, using the distal internal carotid diameter as the denominator. RADIATION DOSE REDUCTION: This exam was performed according to the departmental dose-optimization program which includes automated exposure control, adjustment of the mA and/or kV according to patient size and/or use of iterative reconstruction technique. CONTRAST:  42m OMNIPAQUE IOHEXOL 350 MG/ML SOLN COMPARISON:  Noncontrast head CT 10/26/2020. CT angiogram head/neck 06/16/2016. FINDINGS: CT HEAD FINDINGS Brain: No age advanced or lobar predominant parenchymal atrophy. Mild patchy and ill-defined hypoattenuation within the cerebral white matter, nonspecific but compatible with chronic small vessel disease. There is no acute intracranial hemorrhage. No demarcated cortical infarct. No extra-axial fluid collection. No evidence of an intracranial mass. No midline shift. Vascular: No hyperdense vessel. Atherosclerotic calcifications. Skull: No fracture or aggressive osseous lesion. Sinuses/Orbits: No mass or acute finding within the imaged orbits. Minimal mucosal thickening within bilateral ethmoid air cells and within the bilateral sphenoid sinuses. Mild mucosal thickening within the inferior right maxillary sinus. Small mucous retention cyst within the left maxillary sinus. ASPECTS [facility]) - Ganglionic level infarction (caudate, lentiform nuclei, internal capsule, insula, M1-M3 cortex): 7 - Supraganglionic infarction (M4-M6 cortex): 3 Total score (0-10 with 10 being  normal): 10 Review of the MIP images confirms the above  findings These results were communicated to Dr. Rory Percy at 3:08 pmon 12/27/2023by text page via the [facility] messaging system. CTA NECK FINDINGS Aortic arch: Standard aortic branching. The visualized aortic arch is normal in caliber. Streak and beam hardening artifact arising from a dense right-sided contrast bolus partially obscures the right subclavian artery. Within this limitation, there is no appreciable hemodynamically significant innominate or proximal subclavian artery stenosis. Right carotid system: Since the prior CTA head/neck of 06/16/2016, a right carotid endarterectomy has been performed. CCA and ICA patent within the neck without stenosis. Left carotid system: CCA and ICA patent within the neck. Atherosclerotic plaque about the carotid bifurcation and within the proximal ICA. Resultant 40% stenosis of the proximal ICA, unchanged. Vertebral arteries: The dominant right vertebral artery is patent within the neck without stenosis. The non dominant left vertebral artery is patent within the neck. As before, atherosclerotic plaque results in moderate/severe stenosis at the origin of this vessel. Skeleton: Cervical spondylosis. No acute fracture or aggressive osseous lesion. Other neck: No neck mass or cervical lymphadenopathy. Upper chest: Prior median sternotomy. No consolidation within the imaged lung apices. Review of the MIP images confirms the above findings CTA HEAD FINDINGS Anterior circulation: The intracranial internal carotid arteries are patent. Atherosclerotic plaque within both vessels with no more than mild stenosis. The M1 middle cerebral arteries are patent. No M2 proximal branch occlusion or high-grade proximal stenosis. The anterior cerebral arteries are patent. No intracranial aneurysm is identified. Posterior circulation: The intracranial vertebral arteries are patent. The basilar artery is patent. The posterior cerebral arteries are patent. Posterior communicating arteries are diminutive or  absent, bilaterally. Venous sinuses: Within the limitations of contrast timing, no convincing thrombus. Anatomic variants: As described. Review of the MIP images confirms the above findings No emergent large vessel occlusion identified. These results were communicated to Dr. Rory Percy at 3:30 pmon 12/27/2023by text page via the [facility] messaging system. IMPRESSION: CT head: 1. No evidence of acute intracranial abnormality. 2. Mild chronic small vessel ischemic changes within the cerebral white matter. 3. Paranasal sinus disease, as described. CTA neck: 1. A right carotid endarterectomy has been performed since the prior CTA head/neck of 06/16/2016. The common carotid and internal carotid arteries are patent within the neck without hemodynamically significant stenosis. Unchanged 40% stenosis at the origin of the left ICA. 2. Vertebral arteries patent within the neck. Unchanged moderate-to-severe atherosclerotic narrowing at the origin of the non-dominant left vertebral artery. CTA head: No intracranial large vessel occlusion or proximal high-grade arterial stenosis identified. Electronically Signed   By: Kellie Simmering D.O.   On: 01/14/2022 15:32    PHYSICAL EXAM General: Alert, well-nourished, well-developed patient in no acute distress Respiratory: Regular, unlabored respirations on room air  NEURO:  Mental Status: AA&Ox3  Speech/Language: speech is without dysarthria or aphasia, patient states his speech is normal today  Cranial Nerves:  II: PERRL.  III, IV, VI: EOMI. Eyelids elevate symmetrically.  V: Sensation is intact to light touch and symmetrical to face.  VII: Smile is symmetrical.  VIII: hearing intact to voice. IX, X: Phonation is normal.  DQ:[id number] shrug 5/5. XII: tongue is midline without fasciculations. Motor: 5/5 strength to all muscle groups tested.  Tone: is normal and bulk is normal Sensation- Intact to light touch bilaterally.  Coordination: FTN intact bilaterally.No drift.  DTRs:  Unable to elicit Gait- deferred   ASSESSMENT/PLAN Jacob [name] is a 71 y.o. male with history  of CAD, TIA and carotid artery stenosis status post carotid endarterectomy, Crohn's disease, hyperlipidemia and hypertension presenting afterhe accidentally drank Vicks vaporizer solution containing camphor instead of the magnesium citrate he had intended to drink.  Patient called poison control and was told to call EMS if he had a syncopal episode or vomited.  He then had 2 syncopal episodes and did hit his head upon falling 1 time as well as some nausea and vomiting.  He called EMS and was brought to the ED.  At that time, he did have some dysarthria, but he states his speech is back to baseline now.  TIA versus encephalopathy due to toxic ingestion- symptoms resolved Code Stroke CT head No acute abnormality. Small vessel disease.  CTA head & neck no LVO, 40% stenosis of left ICA, status post right carotid endarterectomy MRI no acute abnormality 2D Echo pending LDL 98 HgbA1c-pending VTE prophylaxis -SCDs    Diet   Diet Heart Room service appropriate? Yes; Fluid consistency: Thin   aspirin 81 mg daily prior to admission, now on aspirin 81 mg daily.  Therapy recommendations: Pending Disposition: Home  Hypertension Home meds: Amlodipine 5 mg daily, lisinopril 20 mg daily metoprolol XL 25 mg daily Stable Permissive hypertension (OK if < 220/120) but gradually normalize in 5-7 days Long-term BP goal normotensive  Hyperlipidemia Home meds: Atorvastatin 40 mg daily, resumed in hospital, consider increase to 80 mg daily LDL 98, goal < 70 Continue statin at discharge  Toxic ingestion of camphor containing solution Patient had accidental ingestion of vaporizer fluid containing camphor Had 2 syncopal episodes as well as nausea and vomiting, symptoms resolved now Back to baseline-likely side effects from the ingredients of this vaporizer solution.  Other Stroke Risk Factors Advanced  Age >/= 56  Cigarette smoker advised to stop smoking ETOH use, alcohol level 11, advised to drink no more than 1-2 drink(s) a day Hx TIA Carotid artery stenosis  Other Active Problems Head injury with fall after syncopal episode-CT head negative for acute abnormality  Hospital day # [place] , MSN, AGACNP-BC [facility] See Amion for schedule and pager information 01/15/2022 8:27 AM    To contact Stroke Continuity provider, please refer to [email or web address] After hours, contact General Neurology    Attending Neurohospitalist Addendum Patient seen and examined with APP/Resident. Agree with the history and physical as documented above. Agree with the plan as documented, which I helped formulate. I have independently reviewed the chart, obtained history, review of systems and examined the patient.I have personally reviewed pertinent head/neck/spine imaging (CT/MRI).   Briefly, patient arrived as a code stroke due to concerns for altered mental status, slurred speech and was noted to also have very subtle right facial droop versus probably baseline facial asymmetry-this was hard to ascertain.  Given his acute presentation although it was in the setting of ingestion of camphor containing liquid, he was admitted for stroke rule out/TIA risk factor workup versus toxic metabolic encephalopathy from the ingestion of camphor. I suspect that more of her symptoms were from the ingestion of the camphor containing liquid but is still high risk factor workup has been completed. I have independently reviewed imaging-no evidence of stroke on MRI.  No significant intervenable finding on the neck-has had a right CEA in the past.  Hyperlipidemia-LDL goal less than 70. Has a 2D echocardiogram pending-if that is abnormal, please call back. Otherwise he can follow with outpatient neurology.  Agree with the antiplatelets as above Plan was  relayed to Dr. Avon Gully.   Please feel free  to call with any questions.  -- Amie [place], MD Neurologist [facility] Pager: [phone]

## 2022-01-15 NOTE — Evaluation (Signed)
Physical Therapy Evaluation and D/C Patient Details Name: Jacob [name] MRN: [id number] DOB: 07-02-1950 Today's Date: 01/15/2022  History of Present Illness  Jacob [name] is a 71 y.o. male admitted 12/27 with stroke like symptoms.Pt accidently drank his vaporizing solution which was camphor instead of his magnesium citrate.  Noted he had burning in his throat followed by nausea and vomiting.  Subsequently had 2 episodes of syncope where he fell and hit his head both times.  He has had continued nausea and vomiting confusion and speech difficulty.  EMS transported patient to the ED as a code stroke due to neurologic deficits.  Did have some right facial droop and aphasia on arrival which has improved in the ED.  PMH:   TIA, carotid artery disease, hyperlipidemia, hypertension, CAD, Graves' disease, bradycardia  Clinical Impression  Pt admitted with above diagnosis.  Pt currently without functional limitations and scored 23/24 on DGI with no deficits noted.  Pt has cane and RW he can use if needed at home.  Pt reports he has returned to his baseline status.  Will sign off as pt has no PT needs.     Recommendations for follow up therapy are one component of a multi-disciplinary discharge planning process, led by the attending physician.  Recommendations may be updated based on patient status, additional functional criteria and insurance authorization.  Follow Up Recommendations No PT follow up      Assistance Recommended at Discharge None  Patient can return home with the following       Equipment Recommendations None recommended by PT  Recommendations for Other Services       Functional Status Assessment Patient has not had a recent decline in their functional status     Precautions / Restrictions Precautions Precautions: Fall Restrictions Weight Bearing Restrictions: No      Mobility  Bed Mobility Overal bed mobility: Independent                   Transfers Overall transfer level: Independent                      Ambulation/Gait Ambulation/Gait assistance: Independent Gait Distance (Feet): 400 Feet Assistive device: None Gait Pattern/deviations: WFL(Within Functional Limits)   Gait velocity interpretation: >2.62 ft/sec, indicative of community ambulatory   General Gait Details: No LOB with challenges in uncontrolled environment.  Stairs Stairs: Yes Stairs assistance: Modified independent (Device/Increase time) Stair Management: One rail Right, Alternating pattern, Forwards Number of Stairs: 2 General stair comments: No issues with steps  Wheelchair Mobility    Modified Rankin (Stroke Patients Only) Modified Rankin (Stroke Patients Only) Pre-Morbid Rankin Score: No symptoms Modified Rankin: No symptoms     Balance Overall balance assessment: Independent                               Standardized Balance Assessment Standardized Balance Assessment : Dynamic Gait Index   Dynamic Gait Index Level Surface: Normal Change in Gait Speed: Normal Gait with Horizontal Head Turns: Normal Gait with Vertical Head Turns: Normal Gait and Pivot Turn: Normal Step Over Obstacle: Normal Step Around Obstacles: Normal Steps: Mild Impairment Total Score: 23       Pertinent Vitals/Pain Pain Assessment Pain Assessment: Faces Faces Pain Scale: Hurts little more Pain Location: left hip Pain Descriptors / Indicators: Grimacing, Guarding Pain Intervention(s): Limited activity within patient's tolerance, Monitored during session, Repositioned    Home Living  Family/patient expects to be discharged to:: Private residence Living Arrangements: Spouse/significant other (4 year old son and grandson lives with pt) Available Help at Discharge: Family;Available 24 hours/day (48 year old son can help as well) Type of Home: House Home Access: Stairs to enter Entrance Stairs-Rails: Right Entrance Stairs-Number  of Steps: 2   Home Layout: One level Home Equipment: Grab bars - toilet;Grab bars - tub/shower;Shower seat - built in;BSC/3in1;Rollator (4 wheels);Cane - single point;Hand held shower head      Prior Function Prior Level of Function : Needs assist;Driving (Retired)             Mobility Comments: used cane PTA with Modif I ADLs Comments: B/D self     Hand Dominance   Dominant Hand: Right    Extremity/Trunk Assessment   Upper Extremity Assessment Upper Extremity Assessment: Overall WFL for tasks assessed    Lower Extremity Assessment Lower Extremity Assessment: Overall WFL for tasks assessed    Cervical / Trunk Assessment Cervical / Trunk Assessment: Normal  Communication   Communication: No difficulties  Cognition Arousal/Alertness: Awake/alert Behavior During Therapy: WFL for tasks assessed/performed Overall Cognitive Status: Within Functional Limits for tasks assessed                                          General Comments General comments (skin integrity, edema, etc.): 68 bpm, 95% RA, 132/71.  sitting 150/92, 72 bpm; standing 136/81, standing after 3 minutes 140/88, 72 bpm    Exercises     Assessment/Plan    PT Assessment Patient does not need any further PT services  PT Problem List         PT Treatment Interventions      PT Goals (Current goals can be found in the Care Plan section)  Acute Rehab PT Goals Patient Stated Goal: to go home PT Goal Formulation: All assessment and education complete, DC therapy    Frequency       Co-evaluation               AM-PAC PT "6 Clicks" Mobility  Outcome Measure Help needed turning from your back to your side while in a flat bed without using bedrails?: None Help needed moving from lying on your back to sitting on the side of a flat bed without using bedrails?: None Help needed moving to and from a bed to a chair (including a wheelchair)?: None Help needed standing up from a chair  using your arms (e.g., wheelchair or bedside chair)?: None Help needed to walk in hospital room?: None Help needed climbing 3-5 steps with a railing? : None 6 Click Score: 24    End of Session Equipment Utilized During Treatment: Gait belt Activity Tolerance: Patient tolerated treatment well Patient left: with call bell/phone within reach (on stretcher) Nurse Communication: Mobility status PT Visit Diagnosis: Muscle weakness (generalized) (M62.81)    Time: 3903-0092 PT Time Calculation (min) (ACUTE ONLY): 32 min   Charges:   PT Evaluation $PT Eval Low Complexity: 1 Low PT Treatments $Gait Training: 8-22 mins        [facility] M,PT Acute Rehab Services [phone]   Alvira Philips 01/15/2022, 9:16 AM

## 2022-01-15 NOTE — Discharge Summary (Signed)
Physician Discharge Summary  Jacob [name] ZOX:[id number] DOB: [birth year] DOA: 01/14/2022  PCP: Leonides Sake, MD  Admit date: 01/14/2022 Discharge date: 01/15/2022  Admitted From: Home Disposition: Home  Recommendations for Outpatient Follow-up:  Follow up with PCP in 1-2 weeks  Home Health: None Equipment/Devices: None  Discharge Condition: Stable CODE STATUS: Full Diet recommendation: Low-salt low-fat diet  Brief/Interim Summary: Jacob [name] is a 71 y.o. male with medical history significant of TIA, carotid artery disease, hyperlipidemia, hypertension, CAD, Graves' disease, bradycardia presenting with strokelike symptoms. Patient presented as a code stroke.  Earlier in the day he accidentally drank his vaporizing solution which is camphor based (6.2%).  Noted he had burning in his throat followed by nausea and vomiting.  Subsequently had 2 episodes of syncope where he fell and hit his head both times.  He has had continued nausea and vomiting confusion and speech difficulty.  EMS transported patient to the ED as a code stroke due to neurologic deficits.  Did have some right facial droop and aphasia on arrival which has improved in the ED.  Patient symptoms rapidly resolved likely secondary to ingestion of camphor as above.  Given negative imaging and workup thus far, ambulating without difficulty will discharge home with close outpatient follow-up.  No further indication for further workup imaging or procedures.  Resume all home medications as below no new medications at discharge.  Discharge Diagnoses:  Principal Problem:   Acute focal neurological deficit Active Problems:   Coronary atherosclerosis of native coronary artery   Pure hypercholesterolemia   Essential hypertension, benign   Carotid artery stenosis   Crohn's colitis (HCC) CVA versus TIA ruled out Accidental toxic ingestion  Discharge Instructions   Allergies as of 01/15/2022   No Known Allergies       Medication List     TAKE these medications    amLODipine 5 MG tablet Commonly known as: NORVASC Take 1 tablet (5 mg total) by mouth daily.   anastrozole 1 MG tablet Commonly known as: ARIMIDEX Take 0.5 mg by mouth 3 (three) times a week. Mon, wed ,Friday   aspirin EC 81 MG tablet Take 81 mg by mouth daily. Swallow whole.   atorvastatin 40 MG tablet Commonly known as: LIPITOR Take 1 tablet (40 mg total) by mouth daily.   cyanocobalamin 500 MCG tablet Commonly known as: VITAMIN B12 Take 1,000 mcg by mouth daily.   glucosamine-chondroitin 500-400 MG tablet Take 1 tablet by mouth daily.   lisinopril 20 MG tablet Commonly known as: ZESTRIL Take 1 tablet (20 mg total) by mouth daily.   metoprolol succinate 25 MG 24 hr tablet Commonly known as: TOPROL-XL Take 0.5 tablets (12.5 mg total) by mouth daily.   Natesto 5.5 MG/ACT Gel Generic drug: Testosterone Place 1 Application into both nostrils 2 (two) times daily.   nitroGLYCERIN 0.4 MG SL tablet Commonly known as: NITROSTAT Place 1 tablet (0.4 mg total) under the tongue every 5 (five) minutes as needed for chest pain.   sildenafil 100 MG tablet Commonly known as: VIAGRA Take 100 mg by mouth daily as needed for erectile dysfunction.   tamsulosin 0.4 MG Caps capsule Commonly known as: FLOMAX Take 0.4 mg by mouth at bedtime.        No Known Allergies  Consultations: Neurology  Procedures/Studies: MR BRAIN WO CONTRAST  Result Date: 01/14/2022 CLINICAL DATA:  Follow-up examination for acute stroke. EXAM: MRI HEAD WITHOUT CONTRAST TECHNIQUE: Multiplanar, multiecho pulse sequences of the brain and surrounding structures were  obtained without intravenous contrast. COMPARISON:  Prior CT from earlier the same day. FINDINGS: Brain: Cerebral volume within normal limits. Scattered patchy T2/FLAIR hyperintensity involving the supratentorial cerebral white matter, most likely related to chronic microvascular ischemic  disease, mild for age. No evidence for acute or subacute ischemia. Gray-white matter differentiation maintained. No areas of chronic cortical infarction. No acute or chronic intracranial blood products. No mass lesion, midline shift or mass effect. No hydrocephalus or extra-axial fluid collection. Pituitary gland and suprasellar region within normal limits. Vascular: Major intracranial vascular flow voids are maintained. Skull and upper cervical spine: Craniocervical junction within normal limits. Bone marrow signal intensity normal. No scalp soft tissue abnormality. Sinuses/Orbits: Prior bilateral ocular lens replacement. Mild scattered mucosal thickening noted about the ethmoidal air cells and maxillary sinuses. Superimposed small left maxillary sinus retention cyst. Mastoid air cells are clear. Other: None. IMPRESSION: 1. No acute intracranial abnormality. 2. Mild chronic microvascular ischemic disease for age. Electronically Signed   By: Jeannine Boga M.D.   On: 01/14/2022 21:46   DG HIP UNILAT WITH PELVIS 2-3 VIEWS LEFT  Result Date: 01/14/2022 CLINICAL DATA:  Left hip pain after syncopal episode EXAM: DG HIP (WITH OR WITHOUT PELVIS) 2-3V LEFT COMPARISON:  CT pelvis 02/11/2018 FINDINGS: Contrast medium is present in the urinary bladder. Mild acetabular spurring, left greater than right. Vascular calcifications noted in the antrum and pelvis. Transitional S1 vertebra. No hip fracture identified. IMPRESSION: 1. No hip fracture identified. 2. Mild acetabular spurring, left greater than right. 3. Transitional S1 vertebra. Electronically Signed   By: Van Clines M.D.   On: 01/14/2022 20:15   DG Chest Port 1 View  Result Date: 01/14/2022 CLINICAL DATA:  Syncope. EXAM: PORTABLE CHEST 1 VIEW COMPARISON:  Chest radiographs 09/25/2017 FINDINGS: Sequelae of CABG are again identified. The cardiomediastinal silhouette is unchanged with normal heart size. There is mild chronic elevation of the left  hemidiaphragm. Lung volumes are low without evidence of acute airspace consolidation, edema, sizable pleural effusion, or pneumothorax. No acute osseous abnormality is seen. IMPRESSION: No active disease. Electronically Signed   By: Logan Bores M.D.   On: 01/14/2022 15:55   CT HEAD CODE STROKE WO CONTRAST  Result Date: 01/14/2022 CLINICAL DATA:  Code stroke. Provided history: Neuro deficit, acute, stroke suspected. Slurred speech. EXAM: CT ANGIOGRAPHY HEAD AND NECK TECHNIQUE: Multidetector CT imaging of the head and neck was performed using the standard protocol during bolus administration of intravenous contrast. Multiplanar CT image reconstructions and MIPs were obtained to evaluate the vascular anatomy. Carotid stenosis measurements (when applicable) are obtained utilizing NASCET criteria, using the distal internal carotid diameter as the denominator. RADIATION DOSE REDUCTION: This exam was performed according to the departmental dose-optimization program which includes automated exposure control, adjustment of the mA and/or kV according to patient size and/or use of iterative reconstruction technique. CONTRAST:  11m OMNIPAQUE IOHEXOL 350 MG/ML SOLN COMPARISON:  Noncontrast head CT 10/26/2020. CT angiogram head/neck 06/16/2016. FINDINGS: CT HEAD FINDINGS Brain: No age advanced or lobar predominant parenchymal atrophy. Mild patchy and ill-defined hypoattenuation within the cerebral white matter, nonspecific but compatible with chronic small vessel disease. There is no acute intracranial hemorrhage. No demarcated cortical infarct. No extra-axial fluid collection. No evidence of an intracranial mass. No midline shift. Vascular: No hyperdense vessel. Atherosclerotic calcifications. Skull: No fracture or aggressive osseous lesion. Sinuses/Orbits: No mass or acute finding within the imaged orbits. Minimal mucosal thickening within bilateral ethmoid air cells and within the bilateral sphenoid sinuses. Mild  mucosal thickening  within the inferior right maxillary sinus. Small mucous retention cyst within the left maxillary sinus. ASPECTS ([place] Stroke Program Early CT Score) - Ganglionic level infarction (caudate, lentiform nuclei, internal capsule, insula, M1-M3 cortex): 7 - Supraganglionic infarction (M4-M6 cortex): 3 Total score (0-10 with 10 being normal): 10 Review of the MIP images confirms the above findings These results were communicated to Dr. Rory Percy at 3:08 pmon 12/27/2023by text page via the [facility] messaging system. CTA NECK FINDINGS Aortic arch: Standard aortic branching. The visualized aortic arch is normal in caliber. Streak and beam hardening artifact arising from a dense right-sided contrast bolus partially obscures the right subclavian artery. Within this limitation, there is no appreciable hemodynamically significant innominate or proximal subclavian artery stenosis. Right carotid system: Since the prior CTA head/neck of 06/16/2016, a right carotid endarterectomy has been performed. CCA and ICA patent within the neck without stenosis. Left carotid system: CCA and ICA patent within the neck. Atherosclerotic plaque about the carotid bifurcation and within the proximal ICA. Resultant 40% stenosis of the proximal ICA, unchanged. Vertebral arteries: The dominant right vertebral artery is patent within the neck without stenosis. The non dominant left vertebral artery is patent within the neck. As before, atherosclerotic plaque results in moderate/severe stenosis at the origin of this vessel. Skeleton: Cervical spondylosis. No acute fracture or aggressive osseous lesion. Other neck: No neck mass or cervical lymphadenopathy. Upper chest: Prior median sternotomy. No consolidation within the imaged lung apices. Review of the MIP images confirms the above findings CTA HEAD FINDINGS Anterior circulation: The intracranial internal carotid arteries are patent. Atherosclerotic plaque within both vessels with no more  than mild stenosis. The M1 middle cerebral arteries are patent. No M2 proximal branch occlusion or high-grade proximal stenosis. The anterior cerebral arteries are patent. No intracranial aneurysm is identified. Posterior circulation: The intracranial vertebral arteries are patent. The basilar artery is patent. The posterior cerebral arteries are patent. Posterior communicating arteries are diminutive or absent, bilaterally. Venous sinuses: Within the limitations of contrast timing, no convincing thrombus. Anatomic variants: As described. Review of the MIP images confirms the above findings No emergent large vessel occlusion identified. These results were communicated to Dr. Rory Percy at 3:30 pmon 12/27/2023by text page via the [facility] messaging system. IMPRESSION: CT head: 1. No evidence of acute intracranial abnormality. 2. Mild chronic small vessel ischemic changes within the cerebral white matter. 3. Paranasal sinus disease, as described. CTA neck: 1. A right carotid endarterectomy has been performed since the prior CTA head/neck of 06/16/2016. The common carotid and internal carotid arteries are patent within the neck without hemodynamically significant stenosis. Unchanged 40% stenosis at the origin of the left ICA. 2. Vertebral arteries patent within the neck. Unchanged moderate-to-severe atherosclerotic narrowing at the origin of the non-dominant left vertebral artery. CTA head: No intracranial large vessel occlusion or proximal high-grade arterial stenosis identified. Electronically Signed   By: Kellie Simmering D.O.   On: 01/14/2022 15:32   CT ANGIO HEAD NECK W WO CM (CODE STROKE)  Result Date: 01/14/2022 CLINICAL DATA:  Code stroke. Provided history: Neuro deficit, acute, stroke suspected. Slurred speech. EXAM: CT ANGIOGRAPHY HEAD AND NECK TECHNIQUE: Multidetector CT imaging of the head and neck was performed using the standard protocol during bolus administration of intravenous contrast. Multiplanar CT image  reconstructions and MIPs were obtained to evaluate the vascular anatomy. Carotid stenosis measurements (when applicable) are obtained utilizing NASCET criteria, using the distal internal carotid diameter as the denominator. RADIATION DOSE REDUCTION: This exam was performed according to  the departmental dose-optimization program which includes automated exposure control, adjustment of the mA and/or kV according to patient size and/or use of iterative reconstruction technique. CONTRAST:  28m OMNIPAQUE IOHEXOL 350 MG/ML SOLN COMPARISON:  Noncontrast head CT 10/26/2020. CT angiogram head/neck 06/16/2016. FINDINGS: CT HEAD FINDINGS Brain: No age advanced or lobar predominant parenchymal atrophy. Mild patchy and ill-defined hypoattenuation within the cerebral white matter, nonspecific but compatible with chronic small vessel disease. There is no acute intracranial hemorrhage. No demarcated cortical infarct. No extra-axial fluid collection. No evidence of an intracranial mass. No midline shift. Vascular: No hyperdense vessel. Atherosclerotic calcifications. Skull: No fracture or aggressive osseous lesion. Sinuses/Orbits: No mass or acute finding within the imaged orbits. Minimal mucosal thickening within bilateral ethmoid air cells and within the bilateral sphenoid sinuses. Mild mucosal thickening within the inferior right maxillary sinus. Small mucous retention cyst within the left maxillary sinus. [place]S [place]) - Ganglionic level infarction (caudate, lentiform nuclei, internal capsule, insula, M1-M3 cortex): 7 - Supraganglionic infarction (M4-M6 cortex): 3 Total score (0-10 with 10 being normal): 10 Review of the MIP images confirms the above findings These results were communicated to Dr. ARory Percyat 3:08 pmon 12/27/2023by text page [facility] the [facility]. CTA NECK FINDINGS Aortic arch: Standard aortic branching. The visualized aortic arch is normal in caliber. Streak and beam  hardening artifact arising from a dense right-sided contrast bolus partially obscures the right subclavian artery. Within this limitation, there is no appreciable hemodynamically significant innominate or proximal subclavian artery stenosis. Right carotid system: Since the prior CTA head/neck of 06/16/2016, a right carotid endarterectomy has been performed. CCA and ICA patent within the neck without stenosis. Left carotid system: CCA and ICA patent within the neck. Atherosclerotic plaque about the carotid bifurcation and within the proximal ICA. Resultant 40% stenosis of the proximal ICA, unchanged. Vertebral arteries: The dominant right vertebral artery is patent within the neck without stenosis. The non dominant left vertebral artery is patent within the neck. As before, atherosclerotic plaque results in moderate/severe stenosis at the origin of this vessel. Skeleton: Cervical spondylosis. No acute fracture or aggressive osseous lesion. Other neck: No neck mass or cervical lymphadenopathy. Upper chest: Prior median sternotomy. No consolidation within the imaged lung apices. Review of the MIP images confirms the above findings CTA HEAD FINDINGS Anterior circulation: The intracranial internal carotid arteries are patent. Atherosclerotic plaque within both vessels with no more than mild stenosis. The M1 middle cerebral arteries are patent. No M2 proximal branch occlusion or high-grade proximal stenosis. The anterior cerebral arteries are patent. No intracranial aneurysm is identified. Posterior circulation: The intracranial vertebral arteries are patent. The basilar artery is patent. The posterior cerebral arteries are patent. Posterior communicating arteries are diminutive or absent, bilaterally. Venous sinuses: Within the limitations of contrast timing, no convincing thrombus. Anatomic variants: As described. Review of the MIP images confirms the above findings No emergent large vessel occlusion identified. These  results were communicated to Dr. ARory Percyat 3:30 pmon 12/27/2023by text page via the [facility]. IMPRESSION: CT head: 1. No evidence of acute intracranial abnormality. 2. Mild chronic small vessel ischemic changes within the cerebral white matter. 3. Paranasal sinus disease, as described. CTA neck: 1. A right carotid endarterectomy has been performed since the prior CTA head/neck of 06/16/2016. The common carotid and internal carotid arteries are patent within the neck without hemodynamically significant stenosis. Unchanged 40% stenosis at the origin of the left ICA. 2. Vertebral arteries patent within the neck.  Unchanged moderate-to-severe atherosclerotic narrowing at the origin of the non-dominant left vertebral artery. CTA head: No intracranial large vessel occlusion or proximal high-grade arterial stenosis identified. Electronically Signed   By: Kellie Simmering D.O.   On: 01/14/2022 15:32     Subjective: No acute issues or events overnight, back to baseline this morning   Discharge Exam: Vitals:   01/15/22 0800 01/15/22 1100  BP: 132/71 136/80  Pulse: 61 60  Resp: 18 18  Temp: 98 F (36.7 C)   SpO2: 94% 93%   Vitals:   01/15/22 0330 01/15/22 0515 01/15/22 0800 01/15/22 1100  BP: 124/68 124/73 132/71 136/80  Pulse: 60 63 61 60  Resp: 18 16 18 18   Temp:   98 F (36.7 C)   TempSrc:   Oral   SpO2: 94% 92% 94% 93%  Weight:      Height:        General: Pt is alert, awake, not in acute distress Cardiovascular: RRR, S1/S2 +, no rubs, no gallops Respiratory: CTA bilaterally, no wheezing, no rhonchi Abdominal: Soft, NT, ND, bowel sounds + Extremities: no edema, no cyanosis    The results of significant diagnostics from this hospitalization (including imaging, microbiology, ancillary and laboratory) are listed below for reference.     Microbiology: No results found for this or any previous visit (from the past 240 hour(s)).   Labs: BNP (last 3 results) No results for  input(s): "BNP" in the last 8760 hours. Basic Metabolic Panel: Recent Labs  Lab 01/14/22 1445 01/14/22 1453 01/14/22 1718  NA 141 141  --   K 3.6 3.6  --   CL 107 105  --   CO2 22  --   --   GLUCOSE 155* 154*  --   BUN 11 11  --   CREATININE 1.31* 1.20  --   CALCIUM 8.6*  --   --   MG  --   --  2.0   Liver Function Tests: Recent Labs  Lab 01/14/22 1445  AST 26  ALT 18  ALKPHOS 75  BILITOT 0.4  PROT 7.1  ALBUMIN 4.0   No results for input(s): "LIPASE", "AMYLASE" in the last 168 hours. No results for input(s): "AMMONIA" in the last 168 hours. CBC: Recent Labs  Lab 01/14/22 1445 01/14/22 1453  WBC 6.9  --   NEUTROABS 4.3  --   HGB 13.8 13.6  HCT 40.9 40.0  MCV 91.7  --   PLT 218  --    Cardiac Enzymes: No results for input(s): "CKTOTAL", "CKMB", "CKMBINDEX", "TROPONINI" in the last 168 hours. BNP: Invalid input(s): "POCBNP" CBG: Recent Labs  Lab 01/14/22 1444  GLUCAP 150*   D-Dimer No results for input(s): "DDIMER" in the last 72 hours. Hgb A1c No results for input(s): "HGBA1C" in the last 72 hours. Lipid Profile Recent Labs    01/15/22 0334  CHOL 155  HDL 38*  LDLCALC 98  TRIG 95  CHOLHDL 4.1   Thyroid function studies No results for input(s): "TSH", "T4TOTAL", "T3FREE", "THYROIDAB" in the last 72 hours.  Invalid input(s): "FREET3" Anemia work up No results for input(s): "VITAMINB12", "FOLATE", "FERRITIN", "TIBC", "IRON", "RETICCTPCT" in the last 72 hours. Urinalysis    Component Value Date/Time   COLORURINE YELLOW (A) 09/25/2017 1925   APPEARANCEUR Clear 12/07/2017 1354   LABSPEC 1.014 09/25/2017 1925   PHURINE 6.0 09/25/2017 1925   GLUCOSEU Negative 12/07/2017 1354   HGBUR NEGATIVE 09/25/2017 1925   BILIRUBINUR Negative 12/07/2017 1354   KETONESUR  NEGATIVE 09/25/2017 1925   PROTEINUR Negative 12/07/2017 1354   PROTEINUR NEGATIVE 09/25/2017 1925   NITRITE Negative 12/07/2017 1354   NITRITE NEGATIVE 09/25/2017 1925   LEUKOCYTESUR  Negative 12/07/2017 1354   Sepsis Labs Recent Labs  Lab 01/14/22 1445  WBC 6.9   Microbiology No results found for this or any previous visit (from the past 240 hour(s)).   Time coordinating discharge: Over 30 minutes  SIGNED:   Little Ishikawa, DO [facility] 01/15/2022, 12:59 PM Pager   If 7PM-7AM, please contact night-coverage [email or web address]

## 2022-01-15 NOTE — Progress Notes (Signed)
OT Cancellation Note  Patient Details Name: Jacob [name] MRN: [id number] DOB: March 27, 1950   Cancelled Treatment:    Reason Eval/Treat Not Completed: OT screened, no needs identified, will sign off  Metta Clines 01/15/2022, 8:54 AM 01/15/2022  RP, OTR/L  Acute Rehabilitation Services  Office:  [phone]

## 2022-01-15 NOTE — Progress Notes (Signed)
  Echocardiogram 2D Echocardiogram has been performed.  Jacob [name] 01/15/2022, 10:58 AM

## 2022-01-15 NOTE — ED Notes (Signed)
Poison control called for pt update. Pts case closed.

## 2022-01-15 NOTE — ED Notes (Signed)
Pts wife Chong Sicilian called for update. Permission granted by pt.

## 2022-01-15 NOTE — ED Notes (Signed)
Pt ambulated to restroom without assistance. Pt back in bed with bed in the lowest position and call light within reach.

## 2022-01-15 NOTE — Progress Notes (Signed)
   01/15/22 0800  Home Living  Family/patient expects to be discharged to: Private residence  Living Arrangements Spouse/significant other (69 year old son and grandson lives with pt)  Available Help at Discharge Family;Available 24 hours/day (57 year old son can help as well)  Type of [place] to enter  Entrance Stairs-Number of Steps 2  Entrance Stairs-Rails Right  Home Layout One level  Bathroom Programmer, multimedia Grab bars - toilet;Grab bars - tub/shower;Shower seat - built in;BSC/3in1;Rollator (4 wheels);Cane - single point;Hand held shower head  Prior Function  Prior Level of Function  Needs assist;Driving (Retired)  Mobility Comments used cane PTA with Modif I  ADLs Comments B/D self  Communication  Communication No difficulties  Pain Assessment  Pain Assessment Faces  Faces Pain Scale 4  Pain Location left hip  Pain Descriptors / Indicators Grimacing;Guarding  Pain Intervention(s) Limited activity within patient's tolerance;Monitored during session;Repositioned  Cognition  Arousal/Alertness Awake/alert  Behavior During Therapy WFL for tasks assessed/performed  Overall Cognitive Status Within Functional Limits for tasks assessed  General Comments  General comments (skin integrity, edema, etc.) 68 bpm, 95% RA, 132/71.  sitting 150/92, 72 bpm; standing 136/81, standing after 3 minutes 140/88, 72 bpm  PT Recommendation  Follow Up Recommendations No PT follow up  Assistance recommended at discharge None  Functional Status Assessment Patient has not had a recent decline in their functional status  PT equipment None recommended by PT  Written Expression  Dominant Hand Right   Pt appears at baseline. No f/u or equipment needs.  Will sign off.  Full eval to follow. Halana Deisher M,PT Acute Rehab Services [phone]

## 2022-01-16 ENCOUNTER — Ambulatory Visit (HOSPITAL_COMMUNITY)
Admission: RE | Admit: 2022-01-16 | Discharge: 2022-01-16 | Disposition: A | Discharge status: Home / Self Care | Payer: Medicare HMO | Source: Ambulatory Visit | Attending: Cardiovascular Disease | Admitting: Cardiovascular Disease

## 2022-01-16 DIAGNOSIS — I6523 Occlusion and stenosis of bilateral carotid arteries: Secondary | ICD-10-CM | POA: Diagnosis present

## 2022-01-16 LAB — HEMOGLOBIN A1C
Hgb A1c MFr Bld: 5.8 % — ABNORMAL HIGH (ref 4.8–5.6)
Mean Plasma Glucose: 120 mg/dL

## 2022-01-20 ENCOUNTER — Encounter: Payer: Self-pay | Admitting: Cardiology

## 2022-01-21 DIAGNOSIS — M62838 Other muscle spasm: Secondary | ICD-10-CM | POA: Diagnosis not present

## 2022-01-21 DIAGNOSIS — Z139 Encounter for screening, unspecified: Secondary | ICD-10-CM | POA: Diagnosis not present

## 2022-01-21 DIAGNOSIS — Z79899 Other long term (current) drug therapy: Secondary | ICD-10-CM | POA: Diagnosis not present

## 2022-01-21 DIAGNOSIS — M545 Low back pain, unspecified: Secondary | ICD-10-CM | POA: Diagnosis not present

## 2022-01-21 DIAGNOSIS — M79606 Pain in leg, unspecified: Secondary | ICD-10-CM | POA: Diagnosis not present

## 2022-01-21 DIAGNOSIS — R69 Illness, unspecified: Secondary | ICD-10-CM | POA: Diagnosis not present

## 2022-01-21 DIAGNOSIS — Z6829 Body mass index (BMI) 29.0-29.9, adult: Secondary | ICD-10-CM | POA: Diagnosis not present

## 2022-02-02 DIAGNOSIS — M79606 Pain in leg, unspecified: Secondary | ICD-10-CM | POA: Diagnosis not present

## 2022-02-02 DIAGNOSIS — M545 Low back pain, unspecified: Secondary | ICD-10-CM | POA: Diagnosis not present

## 2022-02-02 DIAGNOSIS — R519 Headache, unspecified: Secondary | ICD-10-CM | POA: Diagnosis not present

## 2022-02-02 DIAGNOSIS — M62838 Other muscle spasm: Secondary | ICD-10-CM | POA: Diagnosis not present

## 2022-03-19 DIAGNOSIS — G252 Other specified forms of tremor: Secondary | ICD-10-CM | POA: Diagnosis not present

## 2022-03-19 DIAGNOSIS — E538 Deficiency of other specified B group vitamins: Secondary | ICD-10-CM | POA: Diagnosis not present

## 2022-03-19 DIAGNOSIS — R6889 Other general symptoms and signs: Secondary | ICD-10-CM | POA: Diagnosis not present

## 2022-03-19 DIAGNOSIS — G4700 Insomnia, unspecified: Secondary | ICD-10-CM | POA: Diagnosis not present

## 2022-03-19 DIAGNOSIS — N529 Male erectile dysfunction, unspecified: Secondary | ICD-10-CM | POA: Diagnosis not present

## 2022-03-19 DIAGNOSIS — E785 Hyperlipidemia, unspecified: Secondary | ICD-10-CM | POA: Diagnosis not present

## 2022-03-19 DIAGNOSIS — I10 Essential (primary) hypertension: Secondary | ICD-10-CM | POA: Diagnosis not present

## 2022-03-19 DIAGNOSIS — R69 Illness, unspecified: Secondary | ICD-10-CM | POA: Diagnosis not present

## 2022-03-19 DIAGNOSIS — K5190 Ulcerative colitis, unspecified, without complications: Secondary | ICD-10-CM | POA: Diagnosis not present

## 2022-03-19 DIAGNOSIS — M792 Neuralgia and neuritis, unspecified: Secondary | ICD-10-CM | POA: Diagnosis not present

## 2022-04-22 DIAGNOSIS — R251 Tremor, unspecified: Secondary | ICD-10-CM | POA: Diagnosis not present

## 2022-04-22 DIAGNOSIS — R42 Dizziness and giddiness: Secondary | ICD-10-CM | POA: Diagnosis not present

## 2022-05-28 DIAGNOSIS — E291 Testicular hypofunction: Secondary | ICD-10-CM | POA: Diagnosis not present

## 2022-05-28 DIAGNOSIS — N529 Male erectile dysfunction, unspecified: Secondary | ICD-10-CM | POA: Diagnosis not present

## 2022-06-10 ENCOUNTER — Ambulatory Visit: Payer: Medicare HMO | Admitting: Neurology

## 2022-07-14 DIAGNOSIS — F324 Major depressive disorder, single episode, in partial remission: Secondary | ICD-10-CM | POA: Diagnosis not present

## 2022-07-14 DIAGNOSIS — K5190 Ulcerative colitis, unspecified, without complications: Secondary | ICD-10-CM | POA: Diagnosis not present

## 2022-07-14 DIAGNOSIS — I129 Hypertensive chronic kidney disease with stage 1 through stage 4 chronic kidney disease, or unspecified chronic kidney disease: Secondary | ICD-10-CM | POA: Diagnosis not present

## 2022-07-14 DIAGNOSIS — Z008 Encounter for other general examination: Secondary | ICD-10-CM | POA: Diagnosis not present

## 2022-07-14 DIAGNOSIS — I2510 Atherosclerotic heart disease of native coronary artery without angina pectoris: Secondary | ICD-10-CM | POA: Diagnosis not present

## 2022-07-14 DIAGNOSIS — G40909 Epilepsy, unspecified, not intractable, without status epilepticus: Secondary | ICD-10-CM | POA: Diagnosis not present

## 2022-07-14 DIAGNOSIS — G629 Polyneuropathy, unspecified: Secondary | ICD-10-CM | POA: Diagnosis not present

## 2022-07-14 DIAGNOSIS — N182 Chronic kidney disease, stage 2 (mild): Secondary | ICD-10-CM | POA: Diagnosis not present

## 2022-07-14 DIAGNOSIS — Z8249 Family history of ischemic heart disease and other diseases of the circulatory system: Secondary | ICD-10-CM | POA: Diagnosis not present

## 2022-07-14 DIAGNOSIS — I252 Old myocardial infarction: Secondary | ICD-10-CM | POA: Diagnosis not present

## 2022-07-14 DIAGNOSIS — E785 Hyperlipidemia, unspecified: Secondary | ICD-10-CM | POA: Diagnosis not present

## 2022-08-12 ENCOUNTER — Encounter: Payer: Self-pay | Admitting: Neurology

## 2022-08-12 ENCOUNTER — Ambulatory Visit (INDEPENDENT_AMBULATORY_CARE_PROVIDER_SITE_OTHER): Payer: Medicare HMO | Admitting: Neurology

## 2022-08-12 ENCOUNTER — Other Ambulatory Visit (INDEPENDENT_AMBULATORY_CARE_PROVIDER_SITE_OTHER): Payer: Medicare HMO

## 2022-08-12 VITALS — BP 154/84 | HR 66 | Resp 20 | Ht 72.0 in | Wt 212.0 lb

## 2022-08-12 DIAGNOSIS — G63 Polyneuropathy in diseases classified elsewhere: Secondary | ICD-10-CM | POA: Diagnosis not present

## 2022-08-12 DIAGNOSIS — G250 Essential tremor: Secondary | ICD-10-CM | POA: Diagnosis not present

## 2022-08-12 DIAGNOSIS — E538 Deficiency of other specified B group vitamins: Secondary | ICD-10-CM

## 2022-08-12 LAB — TSH: TSH: 2.21 u[IU]/mL (ref 0.35–5.50)

## 2022-08-12 NOTE — Patient Instructions (Addendum)
Check lab  You can try using a weighted glove for tremor  Return to clinic in 6 months

## 2022-08-12 NOTE — Progress Notes (Signed)
Follow-up Visit   Date: 08/12/22   Jacob [name] MRN: [id number] DOB: [birth year]   Interim History: Jacob [name] is a 72 y.o. right-handed Caucasian male with  CAD s/p CABG, R CEA, TIA , Crohn's disease, hypertension, hyperlipidemia returning to the clinic for with new complaints of hand tremor and follow-up of neuropathy.  The patient was accompanied to the clinic by self.   IMPRESSION/PLAN: Benign essential tremor of the hands - Check TSH  - Discussed management is symptomatic and medications can be started if tremor is interfering with quality of life.  He is already taking metoprolol for cardiac indication, therefore, primidone would be an option (not propranolol).    - Patient opted not to take medication at this time and continue to monitor  - Nonpharmacologic options were discussed, such as using weighted gloves   Neuropathy affecting the feet, contributed by B12 and age. Symptoms are primarily numbness in the feet and sensory ataxia.  He does not have associated pain.   Continue to monitor.    Return to clinic in 6 months  ----------------------------------------------------------  History of present illness: Starting around April 2022, he began having imbalance and numbness/tingling involving the toes and balls of the feet. In May, he saw his PCP for these symptoms and was found to have vitamin B12 deficiency (211) and once he started supplemention, his balance has improved. However, the numbness in his feet is unchanged.  Numbness is constant and involves the toes and balls of the feet.  He denies pain or weakness.  Repeat vitamin B12 is normal, per patient.    He has been on disability for heart disease (2005). He lives with wife, 51 year old, and 57 year-old grandson.  UPDATE 12/09/2920:  He is here for follow-up. There has been no change with the numbness of feet. He continues to have thick padding sensation over the balls of the feet. There has been no  progression.  No pain or tingling.  Balance is good.  He had a fall off a 6 foot ladder and injury his left wrist. He got COVID a few weeks ago and has recovered from this.  His B12 level was rechecked by his PCP and improved.  UPDATE 11/28/2021:  He is here for follow-up.  There hsa been no change in the numbness of his feet, which remains at the pads of the toes and ball of the feet.  No associated tingling.  He sometimes has sensation that his feet are cold or wet.  He has noticed weakness of toe movement and imbalance.  Fortunately, no falls. He remains on B12 supplementation. No new complaints.   UPDATE 08/12/2022:  He is here for follow-up visit.  He was hospitalized in December after accidentally drinking vicks vaporizing solution and having two syncopal spells, confusion, and speech difficulty.  Symptoms self-resolved over the following day.  MRI brain did not show any acute changes.  CTA head and neck showed 40% LICA stenosis, s/p right CEA.  Since this time, he has noticed bilateral hand tremor which is worse when he is trying to write or eat.  He is not aware of any family members with tremors.   Numbness in the feet is unchanged and remains below the ankles.  He is compliant with taking vitamin B12 supplements.   Medications:  Current Outpatient Medications on File Prior to Visit  Medication Sig Dispense Refill   amLODipine (NORVASC) 5 MG tablet Take 1 tablet (5 mg total) by mouth daily.  90 tablet 3   anastrozole (ARIMIDEX) 1 MG tablet Take 0.5 mg by mouth 3 (three) times a week. Mon, wed ,Friday     aspirin EC 81 MG tablet Take 81 mg by mouth daily. Swallow whole.     atorvastatin (LIPITOR) 40 MG tablet Take 1 tablet (40 mg total) by mouth daily. 90 tablet 3   glucosamine-chondroitin 500-400 MG tablet Take 1 tablet by mouth daily.     lisinopril (ZESTRIL) 20 MG tablet Take 1 tablet (20 mg total) by mouth daily. 90 tablet 3   metoprolol succinate (TOPROL-XL) 25 MG 24 hr tablet Take 0.5  tablets (12.5 mg total) by mouth daily. 90 tablet 3   NATESTO 5.5 MG/ACT GEL Place 1 Application into both nostrils 2 (two) times daily.     nitroGLYCERIN (NITROSTAT) 0.4 MG SL tablet Place 1 tablet (0.4 mg total) under the tongue every 5 (five) minutes as needed for chest pain. 25 tablet 5   sildenafil (VIAGRA) 100 MG tablet Take 100 mg by mouth daily as needed for erectile dysfunction.     tamsulosin (FLOMAX) 0.4 MG CAPS capsule Take 0.4 mg by mouth at bedtime.     vitamin B-12 (CYANOCOBALAMIN) 500 MCG tablet Take 1,000 mcg by mouth daily.     Current Facility-Administered Medications on File Prior to Visit  Medication Dose Route Frequency Provider Last Rate Last Admin   0.9 %  sodium chloride infusion  500 mL Intravenous Once Armbruster, Willaim Rayas, MD       0.9 %  sodium chloride infusion  500 mL Intravenous Once Cirigliano, Vito V, DO        Allergies: No Known Allergies  Vital Signs:  BP (!) 154/84   Pulse 66   Resp 20   Ht 6' (1.829 m)   Wt 212 lb (96.2 kg)   SpO2 99%   BMI 28.75 kg/m   Neurological Exam: MENTAL STATUS including orientation to time, place, person, recent and remote memory, attention span and concentration, language, and fund of knowledge is normal.  Speech is not dysarthric.  CRANIAL NERVES:  Normal conjugate, extra-ocular eye movements in all directions of gaze.  No ptosis   MOTOR:  Motor strength is 5/5 in all extremities, except distally in the feet with toe extension and flexion 4/5. There is mild bilateral intention tremor of the hands which is more pronounced with finger to nose testing. No pronator drift.  Tone is normal.    MSRs:  Reflexes are 2+/4 throughout, except absent at the ankles.  SENSORY:  Intact to vibration at the knees, reduced below the ankles.  COORDINATION/GAIT:   Gait narrow based and stable.  Data: MRI brain wo contrast 01/14/2022: 1. No acute intracranial abnormality. 2. Mild chronic microvascular ischemic disease for  age.  CTA head and neck 01/15/2023: CTA neck: 1. A right carotid endarterectomy has been performed since the prior CTA head/neck of 06/16/2016. The common carotid and internal carotid arteries are patent within the neck without hemodynamically significant stenosis. Unchanged 40% stenosis at the origin of the left ICA. 2. Vertebral arteries patent within the neck. Unchanged moderate-to-severe atherosclerotic narrowing at the origin of the non-dominant left vertebral artery.   CTA head: No intracranial large vessel occlusion or proximal high-grade arterial stenosis identified.    Thank you for allowing me to participate in patient's care.  If I can answer any additional questions, I would be pleased to do so.    Sincerely,    Esker Dever K. Allena Katz, DO

## 2022-08-26 DIAGNOSIS — I10 Essential (primary) hypertension: Secondary | ICD-10-CM | POA: Diagnosis not present

## 2022-08-26 DIAGNOSIS — M722 Plantar fascial fibromatosis: Secondary | ICD-10-CM | POA: Diagnosis not present

## 2022-08-26 DIAGNOSIS — R600 Localized edema: Secondary | ICD-10-CM | POA: Diagnosis not present

## 2022-08-31 ENCOUNTER — Encounter: Payer: Self-pay | Admitting: Gastroenterology

## 2022-10-13 ENCOUNTER — Ambulatory Visit: Payer: Medicare HMO | Admitting: Gastroenterology

## 2022-10-29 DIAGNOSIS — Z139 Encounter for screening, unspecified: Secondary | ICD-10-CM | POA: Diagnosis not present

## 2022-10-29 DIAGNOSIS — Z1211 Encounter for screening for malignant neoplasm of colon: Secondary | ICD-10-CM | POA: Diagnosis not present

## 2022-10-29 DIAGNOSIS — Z1331 Encounter for screening for depression: Secondary | ICD-10-CM | POA: Diagnosis not present

## 2022-10-29 DIAGNOSIS — Z0000 Encounter for general adult medical examination without abnormal findings: Secondary | ICD-10-CM | POA: Diagnosis not present

## 2022-10-29 DIAGNOSIS — Z9181 History of falling: Secondary | ICD-10-CM | POA: Diagnosis not present

## 2022-11-04 ENCOUNTER — Other Ambulatory Visit: Payer: Self-pay | Admitting: Physician Assistant

## 2022-11-04 NOTE — Telephone Encounter (Signed)
PT NEEDS TO CALL AND MAKE APPT FOR FUTURE REFILLS .

## 2022-11-05 ENCOUNTER — Telehealth: Payer: Self-pay | Admitting: Physician Assistant

## 2022-11-05 MED ORDER — AMLODIPINE BESYLATE 5 MG PO TABS: 5.0000 mg | ORAL_TABLET | Freq: Every day | ORAL | 0 refills | Status: AC

## 2022-11-05 MED ORDER — LISINOPRIL 20 MG PO TABS: 20.0000 mg | ORAL_TABLET | Freq: Every day | ORAL | 0 refills | Status: DC

## 2022-11-05 NOTE — Telephone Encounter (Signed)
Pt's medications were sent to pt's pharmacy as requested. Confirmation received.  

## 2022-11-05 NOTE — Telephone Encounter (Signed)
*  STAT* If patient is at the pharmacy, call can be transferred to refill team.   1. Which medications need to be refilled? (please list name of each medication and dose if known)   amLODipine (NORVASC) 5 MG tablet    anastrozole (ARIMIDEX) 1 MG tablet    lisinopril (ZESTRIL) 20 MG tablet    2. Which pharmacy/location (including street and city if local pharmacy) is medication to be sent to?  [id number] - [place] ([place]), [place] - [address]      3. Do they need a 30 day or 90 day supply? 90 day    Pt is out of medications

## 2022-11-17 DIAGNOSIS — M25572 Pain in left ankle and joints of left foot: Secondary | ICD-10-CM | POA: Diagnosis not present

## 2022-11-17 DIAGNOSIS — S93492A Sprain of other ligament of left ankle, initial encounter: Secondary | ICD-10-CM | POA: Diagnosis not present

## 2022-11-17 DIAGNOSIS — S80812A Abrasion, left lower leg, initial encounter: Secondary | ICD-10-CM | POA: Diagnosis not present

## 2022-11-18 ENCOUNTER — Emergency Department (HOSPITAL_BASED_OUTPATIENT_CLINIC_OR_DEPARTMENT_OTHER): Payer: Medicare HMO

## 2022-11-18 ENCOUNTER — Emergency Department (HOSPITAL_BASED_OUTPATIENT_CLINIC_OR_DEPARTMENT_OTHER)
Admission: EM | Admit: 2022-11-18 | Discharge: 2022-11-18 | Disposition: A | Discharge status: Home / Self Care | Payer: Medicare HMO | Attending: Emergency Medicine | Admitting: Emergency Medicine

## 2022-11-18 ENCOUNTER — Encounter (HOSPITAL_BASED_OUTPATIENT_CLINIC_OR_DEPARTMENT_OTHER): Payer: Self-pay | Admitting: Emergency Medicine

## 2022-11-18 ENCOUNTER — Other Ambulatory Visit: Payer: Self-pay

## 2022-11-18 DIAGNOSIS — M7989 Other specified soft tissue disorders: Secondary | ICD-10-CM | POA: Diagnosis not present

## 2022-11-18 DIAGNOSIS — I2510 Atherosclerotic heart disease of native coronary artery without angina pectoris: Secondary | ICD-10-CM | POA: Diagnosis not present

## 2022-11-18 DIAGNOSIS — Z79899 Other long term (current) drug therapy: Secondary | ICD-10-CM | POA: Insufficient documentation

## 2022-11-18 DIAGNOSIS — W19XXXA Unspecified fall, initial encounter: Secondary | ICD-10-CM | POA: Diagnosis not present

## 2022-11-18 DIAGNOSIS — S8012XA Contusion of left lower leg, initial encounter: Secondary | ICD-10-CM | POA: Diagnosis not present

## 2022-11-18 DIAGNOSIS — S8992XA Unspecified injury of left lower leg, initial encounter: Secondary | ICD-10-CM | POA: Diagnosis not present

## 2022-11-18 DIAGNOSIS — R600 Localized edema: Secondary | ICD-10-CM | POA: Diagnosis not present

## 2022-11-18 DIAGNOSIS — Z7982 Long term (current) use of aspirin: Secondary | ICD-10-CM | POA: Insufficient documentation

## 2022-11-18 DIAGNOSIS — M79605 Pain in left leg: Secondary | ICD-10-CM | POA: Diagnosis not present

## 2022-11-18 DIAGNOSIS — I10 Essential (primary) hypertension: Secondary | ICD-10-CM | POA: Diagnosis not present

## 2022-11-18 NOTE — ED Provider Notes (Signed)
Jacob [name] EMERGENCY DEPARTMENT AT [facility] Provider Note   CSN: [id number] Arrival date & time: 11/18/22  0915     History  Chief Complaint  Patient presents with   Leg Injury    Jacob [name] is a 72 y.o. male.  Patient is a 72 year old male with a history of ulcerative colitis, hypertension, carotid artery stenosis, CAD status post 5 bypasses who is presenting today with ongoing pain and swelling in his left lower leg.  Patient reports 1 week ago he was trimming bushes when the cord wrapped around his legs causing him to fall onto his left leg and twisted his ankle.  He has had ongoing pain and swelling of the site since.  He went and saw Eulah Pont and Thurston Hole yesterday had imaging done in the office and they told him all the bones were normal but were concerned that he could have a blood clot in his leg and wanted him to follow-up.  That is why he is here today.  He has had persistent swelling in the left leg and bruising.  It is tender with palpation.  He has been able to ambulate.  Patient states that he is on a blood thinner but is not sure which one.  The history is provided by the patient.       Home Medications Prior to Admission medications   Medication Sig Start Date End Date Taking? Authorizing Provider  amitriptyline (ELAVIL) 25 MG tablet Take 25 mg by mouth at bedtime. 07/24/22  Yes [provider]  amLODipine (NORVASC) 5 MG tablet Take 1 tablet (5 mg total) by mouth daily. 11/05/22   Sharlene Dory, PA-C  anastrozole (ARIMIDEX) 1 MG tablet Take 0.5 mg by mouth 3 (three) times a week. Mon, wed ,Friday 06/20/21   [provider]  aspirin EC 81 MG tablet Take 81 mg by mouth daily. Swallow whole.    [provider]  atorvastatin (LIPITOR) 40 MG tablet Take 1 tablet (40 mg total) by mouth daily. 10/13/21   Sharlene Dory, PA-C  glucosamine-chondroitin 500-400 MG tablet Take 1 tablet by mouth daily.    [provider]  lisinopril  (ZESTRIL) 20 MG tablet Take 1 tablet (20 mg total) by mouth daily. 11/05/22   Sharlene Dory, PA-C  metoprolol succinate (TOPROL-XL) 25 MG 24 hr tablet Take 0.5 tablets (12.5 mg total) by mouth daily. 10/13/21   Sharlene Dory, PA-C  NATESTO 5.5 MG/ACT GEL Place 1 Application into both nostrils 2 (two) times daily. 09/15/21   [provider]  nitroGLYCERIN (NITROSTAT) 0.4 MG SL tablet Place 1 tablet (0.4 mg total) under the tongue every 5 (five) minutes as needed for chest pain. 10/13/21   Sharlene Dory, PA-C  sildenafil (VIAGRA) 100 MG tablet Take 100 mg by mouth daily as needed for erectile dysfunction. 12/13/21   [provider]  tamsulosin (FLOMAX) 0.4 MG CAPS capsule Take 0.4 mg by mouth at bedtime. 07/04/21   [provider]  vitamin B-12 (CYANOCOBALAMIN) 500 MCG tablet Take 1,000 mcg by mouth daily.    [provider]      Allergies    Patient has no known allergies.    Review of Systems   Review of Systems  Physical Exam Updated Vital Signs BP 136/87   Pulse 66   Temp 98.2 F (36.8 C)   Resp 18   Ht 6' (1.829 m)   Wt 94.8 kg   SpO2 100%   BMI 28.35  kg/m  Physical Exam Vitals and nursing note reviewed.  Constitutional:      General: He is not in acute distress.    Appearance: He is well-developed.  HENT:     Head: Normocephalic and atraumatic.  Eyes:     Conjunctiva/sclera: Conjunctivae normal.     Pupils: Pupils are equal, round, and reactive to light.  Cardiovascular:     Rate and Rhythm: Normal rate.     Pulses: Normal pulses.  Pulmonary:     Effort: Pulmonary effort is normal. No respiratory distress.  Musculoskeletal:        General: Tenderness present. Normal range of motion.     Cervical back: Normal range of motion and neck supple.     Left lower leg: Edema present.     Comments: Significant ecchymosis, swelling of the left lower leg below the knee.  Ecchymosis in various stages of healing.  Tenderness with palpation of the  medial and lateral malleolus.  Healing abrasions also noted to the leg.  Knee exam is normal on the left  Skin:    General: Skin is warm and dry.     Findings: No erythema or rash.  Neurological:     Mental Status: He is alert and oriented to person, place, and time. Mental status is at baseline.  Psychiatric:        Behavior: Behavior normal.     ED Results / Procedures / Treatments   Labs (all labs ordered are listed, but only abnormal results are displayed) Labs Reviewed - No data to display  EKG None  Radiology US Venous Img Lower  Left (DVT Study)  Result Date: 11/18/2022 CLINICAL DATA:  Left lower extremity pain and edema after falling last week. Evaluate for DVT. EXAM: LEFT LOWER EXTREMITY VENOUS DOPPLER ULTRASOUND TECHNIQUE: Gray-scale sonography with graded compression, as well as color Doppler and duplex ultrasound were performed to evaluate the lower extremity deep venous systems from the level of the common femoral vein and including the common femoral, femoral, profunda femoral, popliteal and calf veins including the posterior tibial, peroneal and gastrocnemius veins when visible. The superficial great saphenous vein was also interrogated. Spectral Doppler was utilized to evaluate flow at rest and with distal augmentation maneuvers in the common femoral, femoral and popliteal veins. COMPARISON:  None Available. FINDINGS: Contralateral Common Femoral Vein: Respiratory phasicity is normal and symmetric with the symptomatic side. No evidence of thrombus. Normal compressibility. Common Femoral Vein: No evidence of thrombus. Normal compressibility, respiratory phasicity and response to augmentation. Saphenofemoral Junction: No evidence of thrombus. Normal compressibility and flow on color Doppler imaging. Profunda Femoral Vein: No evidence of thrombus. Normal compressibility and flow on color Doppler imaging. Femoral Vein: No evidence of thrombus. Normal compressibility, respiratory  phasicity and response to augmentation. Popliteal Vein: No evidence of thrombus. Normal compressibility, respiratory phasicity and response to augmentation. Calf Veins: No evidence of thrombus. Normal compressibility and flow on color Doppler imaging. Superficial Great Saphenous Vein: No evidence of thrombus. Normal compressibility. Other Findings: There is a minimal amount of subcutaneous edema at the level of the left lower leg and ankle. IMPRESSION: No evidence of DVT within the left lower extremity. Electronically Signed   By: Simonne Come M.D.   On: 11/18/2022 10:21    Procedures Procedures    Medications Ordered in ED Medications - No data to display  ED Course/ Medical Decision Making/ A&P  Medical Decision Making Amount and/or Complexity of Data Reviewed Radiology: ordered and independent interpretation performed. Decision-making details documented in ED Course.   Patient presenting today with ongoing pain and swelling to his left lower leg after a fall 1 week ago.  Seen at orthopedics yesterday had negative imaging per the patient but was recommended to come here to rule out a blood clot.  Patient is not having any chest pain or shortness of breath.  He reports he is on a blood thinner but is unclear which one and we do not have record of this at this time.  Patient is neurovascularly intact on exam.  I have independently visualized and interpreted pt's images today. Ultrasound negative for DVT.  Radiology reports no clot present.  Feel that this is just swelling and hematoma from his fall.  Recommended using a compression sock or Ace wrap.  Elevating his foot when he can.  He has plans to follow-up with Delbert Harness to follow-up about his ankle.        Final Clinical Impression(s) / ED Diagnoses Final diagnoses:  Leg hematoma, left, initial encounter    Rx / DC Orders ED Discharge Orders     None         Gwyneth Sprout, MD 11/18/22  1054

## 2022-11-18 NOTE — Discharge Instructions (Addendum)
No signs of blood clots today in your leg.  You are very bruised from the fall and if you can get a compression sock or even use the Ace wrap and elevate your leg when you are sitting that will help with the swelling but it will take weeks to go away completely.  If the boot is causing more pain wear a good soled shoe.

## 2022-11-18 NOTE — ED Triage Notes (Signed)
Fell while cutting shrubs last week injuring L calf and ankle. Went to ortho dr and put in a boot. States nothing was found on xray. They requested he come here to r/o blood clot.

## 2022-11-18 NOTE — ED Notes (Signed)
6" ace wrap applied to pts left ankle

## 2022-11-25 DIAGNOSIS — S8012XA Contusion of left lower leg, initial encounter: Secondary | ICD-10-CM | POA: Diagnosis not present

## 2022-12-07 ENCOUNTER — Other Ambulatory Visit: Payer: Self-pay | Admitting: Physician Assistant

## 2023-01-22 ENCOUNTER — Ambulatory Visit: Payer: Medicare HMO | Admitting: Physician Assistant

## 2023-01-22 ENCOUNTER — Telehealth: Payer: Self-pay | Admitting: Physician Assistant

## 2023-01-22 NOTE — Telephone Encounter (Signed)
 Pt came in this morning had app with Jacob [name] at 3:35 but he though it was at 9 he was not able to come back this afternoon and need to talk to the nurse about his meds please give him a call.

## 2023-03-18 DIAGNOSIS — I252 Old myocardial infarction: Secondary | ICD-10-CM | POA: Diagnosis not present

## 2023-03-18 DIAGNOSIS — I129 Hypertensive chronic kidney disease with stage 1 through stage 4 chronic kidney disease, or unspecified chronic kidney disease: Secondary | ICD-10-CM | POA: Diagnosis not present

## 2023-03-18 DIAGNOSIS — N529 Male erectile dysfunction, unspecified: Secondary | ICD-10-CM | POA: Diagnosis not present

## 2023-03-18 DIAGNOSIS — K5900 Constipation, unspecified: Secondary | ICD-10-CM | POA: Diagnosis not present

## 2023-03-18 DIAGNOSIS — Z8249 Family history of ischemic heart disease and other diseases of the circulatory system: Secondary | ICD-10-CM | POA: Diagnosis not present

## 2023-03-18 DIAGNOSIS — Z9181 History of falling: Secondary | ICD-10-CM | POA: Diagnosis not present

## 2023-03-18 DIAGNOSIS — G629 Polyneuropathy, unspecified: Secondary | ICD-10-CM | POA: Diagnosis not present

## 2023-03-18 DIAGNOSIS — I739 Peripheral vascular disease, unspecified: Secondary | ICD-10-CM | POA: Diagnosis not present

## 2023-03-18 DIAGNOSIS — K5190 Ulcerative colitis, unspecified, without complications: Secondary | ICD-10-CM | POA: Diagnosis not present

## 2023-03-18 DIAGNOSIS — E669 Obesity, unspecified: Secondary | ICD-10-CM | POA: Diagnosis not present

## 2023-03-18 DIAGNOSIS — I25119 Atherosclerotic heart disease of native coronary artery with unspecified angina pectoris: Secondary | ICD-10-CM | POA: Diagnosis not present

## 2023-03-18 DIAGNOSIS — E785 Hyperlipidemia, unspecified: Secondary | ICD-10-CM | POA: Diagnosis not present

## 2023-03-22 DIAGNOSIS — R252 Cramp and spasm: Secondary | ICD-10-CM | POA: Diagnosis not present

## 2023-03-22 DIAGNOSIS — Z79899 Other long term (current) drug therapy: Secondary | ICD-10-CM | POA: Diagnosis not present

## 2023-03-22 DIAGNOSIS — G4700 Insomnia, unspecified: Secondary | ICD-10-CM | POA: Diagnosis not present

## 2023-03-22 DIAGNOSIS — R7303 Prediabetes: Secondary | ICD-10-CM | POA: Diagnosis not present

## 2023-03-22 DIAGNOSIS — F3340 Major depressive disorder, recurrent, in remission, unspecified: Secondary | ICD-10-CM | POA: Diagnosis not present

## 2023-03-22 DIAGNOSIS — K5190 Ulcerative colitis, unspecified, without complications: Secondary | ICD-10-CM | POA: Diagnosis not present

## 2023-03-22 DIAGNOSIS — Z23 Encounter for immunization: Secondary | ICD-10-CM | POA: Diagnosis not present

## 2023-03-22 DIAGNOSIS — N529 Male erectile dysfunction, unspecified: Secondary | ICD-10-CM | POA: Diagnosis not present

## 2023-03-22 DIAGNOSIS — G629 Polyneuropathy, unspecified: Secondary | ICD-10-CM | POA: Diagnosis not present

## 2023-03-22 DIAGNOSIS — E785 Hyperlipidemia, unspecified: Secondary | ICD-10-CM | POA: Diagnosis not present

## 2023-03-22 DIAGNOSIS — I10 Essential (primary) hypertension: Secondary | ICD-10-CM | POA: Diagnosis not present

## 2023-03-22 DIAGNOSIS — E538 Deficiency of other specified B group vitamins: Secondary | ICD-10-CM | POA: Diagnosis not present

## 2023-03-23 NOTE — Progress Notes (Unsigned)
 Office Visit    Patient Name: Jacob [name] Date of Encounter: 03/24/2023  PCP:  Ailene Ravel, MD   [facility]  Cardiologist:  Armanda Magic, MD  Advanced Practice Provider:  No care team member to display Electrophysiologist:  None   HPI    Jacob [name] is a 73 y.o. male with past medical history significant for multivessel AAS CAD status post CABG and status post PCI of SVG to RI in 2008, dyslipidemia, and hypertension presents today for annual follow-up visit.  The patient was last seen 6/22 by Dr. Mayford Knife.  At that time he denied any chest pain or pressure, SOB, DOE, PND, orthopnea, lower extremity edema, palpitations or syncope.  Was having some dizziness and found that he was vitamin B12 deficient and it had improved with B12 injections.  He was seen by me 10/13/2021, he states that he has some heaviness on the left side of his chest when he is moving around.  It happens a couple times a week.  He does not have a nitroglycerin prescription which we have provided for him today.  He states that he had 7 stents placed.  He does stay active with his 75-year-old grandson.  They go fishing together, biking, and shooting BB guns.  He has a history of coronary bypass grafting back in 2005 and PCI back in 2008.  He had a carotid endarterectomy back in 2018.  Due to his history and risk factors we have ordered a Lexiscan Myoview today for further workup.  He does not think he could complete an exercise stress test.  Reports no shortness of breath nor dyspnea on exertion. No edema, orthopnea, PND. Reports no palpitations.   He was seen in the ED October 24 for right leg pain and swelling.  Noted significant ecchymosis and swelling of the left lower leg below the knee.  He saw orthopedics with negative imaging.  No chest pain or shortness of breath at that time.  Unclear if the patient was taking a blood thinner.  Ultrasound negative for DVT.  Likely a hematoma  from his fall.  Recommended elevation, compression sock or Ace wrap and follow-up with Ortho.  Today, he presents, with a history of carotid artery disease and endarterectomy,  with right leg pain and swelling following a fall while trimming hedges. The patient reports that the leg and ankle swelled up and the toes turned black. The patient sought emergency care and was seen by an orthopedic specialist who applied a boot to the leg. However, the boot exacerbated the pain and was discontinued. The patient self-medicated with diuretics to alleviate the swelling, which eventually resolved, but the process was prolonged.  The patient also reports experiencing chest pain, described as a sharp sensation, like something is stuck in the skin and being twisted near the heart. This pain has been occurring intermittently since the fall and does not appear to be related to activity. The patient also reports episodes of shortness of breath, particularly when bending over, and dizziness upon standing up. The patient's spouse reports that the patient has had episodes of extreme dizziness and difficulty breathing, to the point of needing assistance to bed. During these episodes, the patient was clammy but did not report chest pain.  The patient also reports a tremor that started after a poisoning incident where he accidentally ingested vaporizer fluid. This led to seizures and a concussion. The tremor has persisted since then.  No edema, orthopnea, PND.  Reports no palpitations.   Discussed the use of AI scribe software for clinical note transcription with the patient, who gave verbal consent to proceed.   Past Medical History    Past Medical History:  Diagnosis Date   CAD (coronary artery disease), native coronary artery     multivessel ASCAD s/p CABG and s/p PCI of SVG to RI 2008   Carotid artery stenosis    patent right CEA site and 1-39% Lcarotid stenosis   Crohn's disease (HCC)    flare 2000   Depression     Dyslipidemia    Dyspnea    ED (erectile dysfunction)    History of blood transfusion 2005   Hypertension    Myocardial infarction [facility]) 2005   5 bypasses   Nephrolithiasis    Stroke [facility])    TIA- Endartarectemy R side   TIA (transient ischemic attack)    Ulcerative colitis (HCC)    Left sided   Past Surgical History:  Procedure Laterality Date   CARDIAC CATHETERIZATION  06/08   Patnet grafts LAD, marginal, distal right   carpel tunnel surgery Left    COLONOSCOPY     COLONOSCOPY WITH PROPOFOL N/A 10/25/2012   Procedure: COLONOSCOPY WITH PROPOFOL;  Surgeon: Charolett Bumpers, MD;  Location: [facility] ENDOSCOPY;  Service: Endoscopy;  Laterality: N/A;   CORONARY ARTERY BYPASS GRAFT  2005   ASCVD,multivessel,S/P CABG  x5 total per patient   ENDARTERECTOMY Right 06/24/2016   Procedure: ENDARTERECTOMY CAROTID;  Surgeon: Annice Needy, MD;  Location: [facility] ORS;  Service: Vascular;  Laterality: Right;   HERNIA REPAIR  many years ago   KNEE ARTHROSCOPY Right 1995    Allergies  No Known Allergies   EKGs/Labs/Other Studies Reviewed:   The following studies were reviewed today:   Carotid ultrasound 01/16/2021  Summary:  Right Carotid: Velocities in the right ICA are consistent with a 1-39%  stenosis.   Left Carotid: Velocities in the left ICA are consistent with a 1-39%  stenosis.   Vertebrals:  Bilateral vertebral arteries demonstrate antegrade flow.  Subclavians: Right subclavian artery flow was disturbed. Normal flow               hemodynamics were seen in the left subclavian artery.   *See table(s) above for measurements and observations.  Suggest follow up study in 12 months.   EKG:  EKG is  ordered today.  The ekg ordered today demonstrates normal sinus rhythm, rate 60 bpm  Recent Labs: 08/12/2022: TSH 2.21  Recent Lipid Panel    Component Value Date/Time   CHOL 155 01/15/2022 0334   CHOL 126 06/07/2019 0925   TRIG 95 01/15/2022 0334   HDL 38 (L) 01/15/2022 0334   HDL  40 06/07/2019 0925   CHOLHDL 4.1 01/15/2022 0334   VLDL 19 01/15/2022 0334   LDLCALC 98 01/15/2022 0334   LDLCALC 67 06/07/2019 0925   LDLDIRECT 71 01/24/2013 0914     Home Medications   Current Meds  Medication Sig   amitriptyline (ELAVIL) 25 MG tablet Take 25 mg by mouth at bedtime.   amLODipine (NORVASC) 5 MG tablet Take 1 tablet (5 mg total) by mouth daily.   aspirin EC 81 MG tablet Take 81 mg by mouth daily. Swallow whole.   glucosamine-chondroitin 500-400 MG tablet Take 1 tablet by mouth daily.   vitamin B-12 (CYANOCOBALAMIN) 500 MCG tablet Take 1,000 mcg by mouth daily.   [DISCONTINUED] atorvastatin (LIPITOR) 40 MG tablet Take 1 tablet (40 mg total) by mouth  daily.   [DISCONTINUED] lisinopril (ZESTRIL) 20 MG tablet Take 1 tablet (20 mg total) by mouth daily.   [DISCONTINUED] metoprolol succinate (TOPROL-XL) 25 MG 24 hr tablet Take 1/2 (one-half) tablet by mouth once daily   [DISCONTINUED] nitroGLYCERIN (NITROSTAT) 0.4 MG SL tablet Place 1 tablet (0.4 mg total) under the tongue every 5 (five) minutes as needed for chest pain.   Current Facility-Administered Medications for the 03/24/23 encounter (Office Visit) with Sharlene Dory, PA-C  Medication   0.9 %  sodium chloride infusion   0.9 %  sodium chloride infusion     Review of Systems      All other systems reviewed and are otherwise negative except as noted above.  Physical Exam    VS:  BP 134/80   Pulse 67   Wt 215 lb (97.5 kg)   SpO2 98%   BMI 29.16 kg/m  , BMI Body mass index is 29.16 kg/m.  Wt Readings from Last 3 Encounters:  03/24/23 215 lb (97.5 kg)  11/18/22 209 lb (94.8 kg)  08/12/22 212 lb (96.2 kg)     GEN: Well nourished, well developed, in no acute distress. HEENT: normal. Neck: Supple, no JVD, carotid bruits, or masses. Cardiac: RRR, no murmurs, rubs, or gallops. No clubbing, cyanosis, edema.  Radials/PT 2+ and equal bilaterally.  Respiratory:  Respirations regular and unlabored, clear to  auscultation bilaterally. GI: Soft, nontender, nondistended. MS: No deformity or atrophy. Skin: Warm and dry, no rash. Neuro:  Strength and sensation are intact. Psych: Normal affect.  Assessment & Plan    Intermittent chest pain and dyspnea Intermittent chest pain and dyspnea with dizziness. Differential includes cardiac ischemia or valvular issues. Previous stress test normal, but symptom changes warrant reevaluation. - Order carotid ultrasound to assess for stenosis or occlusion. - Order echocardiogram to evaluate cardiac function and mass near aortic valve. - Order stress test to assess for ischemia.  Peripheral neuropathy Chronic neuropathy in feet with no effective medication provided.  Essential tremor Persistent tremor post-fall and neurological events. Currently on beta blocker. - Refer to neurologist for further evaluation and management of tremor.  Follow-up Requires follow-up after diagnostic tests to review results and adjust treatment plan. - Schedule follow-up appointment after completion of tests.  CAD -s/p CABG 2005 and PCI 2008 -Continue GDMT with Norvasc 5 mg daily, aspirin 81 mg daily, Lipitor 40 mg daily, lisinopril 20 mg daily, metoprolol succinate 12.5 mg daily -refil nitro  Disposition: Follow up after testing with Armanda Magic, MD or APP.  Signed, Sharlene Dory, PA-C 03/24/2023, 11:06 AM [facility]

## 2023-03-24 ENCOUNTER — Ambulatory Visit: Payer: Medicare HMO | Attending: Physician Assistant | Admitting: Physician Assistant

## 2023-03-24 ENCOUNTER — Encounter: Payer: Self-pay | Admitting: Physician Assistant

## 2023-03-24 VITALS — BP 134/80 | HR 67 | Wt 215.0 lb

## 2023-03-24 DIAGNOSIS — I10 Essential (primary) hypertension: Secondary | ICD-10-CM | POA: Diagnosis not present

## 2023-03-24 DIAGNOSIS — R0609 Other forms of dyspnea: Secondary | ICD-10-CM

## 2023-03-24 DIAGNOSIS — E785 Hyperlipidemia, unspecified: Secondary | ICD-10-CM

## 2023-03-24 DIAGNOSIS — R001 Bradycardia, unspecified: Secondary | ICD-10-CM | POA: Diagnosis not present

## 2023-03-24 DIAGNOSIS — I6523 Occlusion and stenosis of bilateral carotid arteries: Secondary | ICD-10-CM

## 2023-03-24 DIAGNOSIS — Z9889 Other specified postprocedural states: Secondary | ICD-10-CM | POA: Diagnosis not present

## 2023-03-24 DIAGNOSIS — R251 Tremor, unspecified: Secondary | ICD-10-CM | POA: Diagnosis not present

## 2023-03-24 DIAGNOSIS — I2510 Atherosclerotic heart disease of native coronary artery without angina pectoris: Secondary | ICD-10-CM

## 2023-03-24 MED ORDER — NITROGLYCERIN 0.4 MG SL SUBL: 0.4000 mg | SUBLINGUAL_TABLET | SUBLINGUAL | 3 refills | Status: DC | PRN

## 2023-03-24 MED ORDER — LISINOPRIL 20 MG PO TABS: 20.0000 mg | ORAL_TABLET | Freq: Every day | ORAL | 3 refills | Status: DC

## 2023-03-24 MED ORDER — METOPROLOL SUCCINATE ER 25 MG PO TB24: 12.5000 mg | ORAL_TABLET | Freq: Every day | ORAL | 3 refills | Status: DC

## 2023-03-24 MED ORDER — ATORVASTATIN CALCIUM 40 MG PO TABS: 40.0000 mg | ORAL_TABLET | Freq: Every day | ORAL | 3 refills | Status: DC

## 2023-03-24 NOTE — Patient Instructions (Signed)
 Medication Instructions:   Your physician recommends that you continue on your current medications as directed. Please refer to the Current Medication list given to you today.   *If you need a refill on your cardiac medications before your next appointment, please call your pharmacy*   Lab Work: NONE ORDERED  TODAY    If you have labs (blood work) drawn today and your tests are completely normal, you will receive your results only by: MyChart Message (if you have MyChart) OR A paper copy in the mail If you have any lab test that is abnormal or we need to change your treatment, we will call you to review the results.   Testing/Procedures:  Your physician has requested that you have an echocardiogram. Echocardiography is a painless test that uses sound waves to create images of your heart. It provides your doctor with information about the size and shape of your heart and how well your heart's chambers and valves are working. This procedure takes approximately one hour. There are no restrictions for this procedure. Please do NOT wear cologne, perfume, aftershave, or lotions (deodorant is allowed). Please arrive 15 minutes prior to your appointment time.  Please note: We ask at that you not bring children with you during ultrasound (echo/ vascular) testing. Due to room size and safety concerns, children are not allowed in the ultrasound rooms during exams. Our front office staff cannot provide observation of children in our lobby area while testing is being conducted. An adult accompanying a patient to their appointment will only be allowed in the ultrasound room at the discretion of the ultrasound technician under special circumstances. We apologize for any inconvenience.  Your physician has requested that you have a lexiscan myoview. For further information please visit [email or web address] Please follow instruction sheet, as given.   Your physician has requested that you have a carotid  duplex. This test is an ultrasound of the carotid arteries in your neck. It looks at blood flow through these arteries that supply the brain with blood. Allow one hour for this exam. There are no restrictions or special instructions.    Follow-Up: At [facility], you and your health needs are our priority.  As part of our continuing mission to provide you with exceptional heart care, we have created designated Provider Care Teams.  These Care Teams include your primary Cardiologist (physician) and Advanced Practice Providers (APPs -  Physician Assistants and Nurse Practitioners) who all work together to provide you with the care you need, when you need it.  We recommend signing up for the patient portal called "MyChart".  Sign up information is provided on this After Visit Summary.  MyChart is used to connect with patients for Virtual Visits (Telemedicine).  Patients are able to view lab/test results, encounter notes, upcoming appointments, etc.  Non-urgent messages can be sent to your provider as well.   To learn more about what you can do with MyChart, go to [email or web address].      Your next appointment:    1 -2   month(s)   Provider:    Julian Hy Conte,/  PA-C, Robin Searing, NP, or Tereso Newcomer, PA-C    Other Instructions

## 2023-03-29 ENCOUNTER — Telehealth (HOSPITAL_COMMUNITY): Payer: Self-pay

## 2023-03-29 NOTE — Telephone Encounter (Signed)
 Spoke with the patient, detailed instructions given. He stated that he would be here for his test. Asked to call back with any questions. S.Melrose Kearse CCT

## 2023-03-30 ENCOUNTER — Ambulatory Visit (HOSPITAL_COMMUNITY): Attending: Internal Medicine

## 2023-03-30 DIAGNOSIS — R0609 Other forms of dyspnea: Secondary | ICD-10-CM | POA: Insufficient documentation

## 2023-03-30 LAB — MYOCARDIAL PERFUSION IMAGING
LV dias vol: 111 mL (ref 62–150)
LV sys vol: 45 mL
Nuc Stress EF: 59 %
Peak HR: 75 {beats}/min
Rest HR: 56 {beats}/min
Rest Nuclear Isotope Dose: 10.7 mCi
SDS: 1
SRS: 0
SSS: 1
Stress Nuclear Isotope Dose: 30.1 mCi
TID: 1.03

## 2023-03-30 MED ORDER — TECHNETIUM TC 99M TETROFOSMIN IV KIT
10.7000 | PACK | Freq: Once | INTRAVENOUS | Status: AC | PRN
  Administered 2023-03-30: 10.7 via INTRAVENOUS

## 2023-03-30 MED ORDER — TECHNETIUM TC 99M TETROFOSMIN IV KIT
30.1000 | PACK | Freq: Once | INTRAVENOUS | Status: AC | PRN
  Administered 2023-03-30: 30.1 via INTRAVENOUS

## 2023-03-30 MED ORDER — REGADENOSON 0.4 MG/5ML IV SOLN
0.4000 mg | Freq: Once | INTRAVENOUS | Status: AC
  Administered 2023-03-30: 0.4 mg via INTRAVENOUS

## 2023-03-31 ENCOUNTER — Ambulatory Visit: Admitting: Neurology

## 2023-03-31 ENCOUNTER — Encounter: Payer: Self-pay | Admitting: Neurology

## 2023-03-31 VITALS — BP 135/74 | HR 59 | Ht 72.0 in | Wt 216.0 lb

## 2023-03-31 DIAGNOSIS — G250 Essential tremor: Secondary | ICD-10-CM | POA: Diagnosis not present

## 2023-03-31 DIAGNOSIS — G629 Polyneuropathy, unspecified: Secondary | ICD-10-CM

## 2023-03-31 MED ORDER — GABAPENTIN 300 MG PO CAPS: 300.0000 mg | ORAL_CAPSULE | Freq: Every day | ORAL | 1 refills | Status: DC

## 2023-03-31 NOTE — Progress Notes (Signed)
 Follow-up Visit   Date: 03/31/23   Jacob [name] MRN: [id number] DOB: [birth year]   Interim History: Jacob [name] is a 73 y.o. right-handed Caucasian male with  CAD s/p CABG, R CEA, TIA , Crohn's disease, hypertension, hyperlipidemia returning to the clinic for with new complaints of hand tremor and follow-up of neuropathy.  The patient was accompanied to the clinic by self.   IMPRESSION: Benign essential tremor of the hands, tremors are starting to interfere with fine motor tasks   Neuropathy affecting the feet, risk factors:  history of B12 deficiency. Symptoms are primarily numbness in the feet and sensory ataxia.  He is starting to have pain.  He does not wish to do PT for balance.   PLAN: Start gabapentin 300mg  at bedtime which may help with neuropathy and tremors.   Return to clinic in 4 months  ----------------------------------------------------------  History of present illness: Starting around April 2022, he began having imbalance and numbness/tingling involving the toes and balls of the feet. In May, he saw his PCP for these symptoms and was found to have vitamin B12 deficiency (211) and once he started supplemention, his balance has improved. However, the numbness in his feet is unchanged.  Numbness is constant and involves the toes and balls of the feet.  He denies pain or weakness.  Repeat vitamin B12 is normal, per patient.    He has been on disability for heart disease (2005). He lives with wife, 63 year old, and 50 year-old grandson.  UPDATE 12/09/2920:  He is here for follow-up. There has been no change with the numbness of feet. He continues to have thick padding sensation over the balls of the feet. There has been no progression.  No pain or tingling.  Balance is good.  He had a fall off a 6 foot ladder and injury his left wrist. He got COVID a few weeks ago and has recovered from this.  His B12 level was rechecked by his PCP and improved.  UPDATE  11/28/2021:  He is here for follow-up.  There hsa been no change in the numbness of his feet, which remains at the pads of the toes and ball of the feet.  No associated tingling.  He sometimes has sensation that his feet are cold or wet.  He has noticed weakness of toe movement and imbalance.  Fortunately, no falls. He remains on B12 supplementation. No new complaints.   UPDATE 08/12/2022:  He is here for follow-up visit.  He was hospitalized in December after accidentally drinking vicks vaporizing solution and having two syncopal spells, confusion, and speech difficulty.  Symptoms self-resolved over the following day.  MRI brain did not show any acute changes.  CTA head and neck showed 40% LICA stenosis, s/p right CEA.  Since this time, he has noticed bilateral hand tremor which is worse when he is trying to write or eat.  He is not aware of any family members with tremors.   Numbness in the feet is unchanged and remains below the ankles.  He is compliant with taking vitamin B12 supplements.   UPDATE 03/31/2023:  He is here for follow-up visit.  He reports having worsening pain in the feet where they feel ice cold and hurt.  Numbness is unchanged and involve the mid-foot to the toes.  His tremors are getting worse and bothering him when trying to eat and do fine motor tasks.  Balance is not very good, he uses a cane at home.  No  falls or illnesses.   Medications:  Current Outpatient Medications on File Prior to Visit  Medication Sig Dispense Refill   amitriptyline (ELAVIL) 25 MG tablet Take 25 mg by mouth at bedtime.     amLODipine (NORVASC) 5 MG tablet Take 1 tablet (5 mg total) by mouth daily. 90 tablet 0   anastrozole (ARIMIDEX) 1 MG tablet Take 0.5 mg by mouth 3 (three) times a week. Mon, wed ,Friday     aspirin EC 81 MG tablet Take 81 mg by mouth daily. Swallow whole.     atorvastatin (LIPITOR) 40 MG tablet Take 1 tablet (40 mg total) by mouth daily. 90 tablet 3   glucosamine-chondroitin 500-400  MG tablet Take 1 tablet by mouth daily.     lisinopril (ZESTRIL) 20 MG tablet Take 1 tablet (20 mg total) by mouth daily. 90 tablet 3   metoprolol succinate (TOPROL-XL) 25 MG 24 hr tablet Take 0.5 tablets (12.5 mg total) by mouth daily. 45 tablet 3   NATESTO 5.5 MG/ACT GEL Place 1 Application into both nostrils 2 (two) times daily.     nitroGLYCERIN (NITROSTAT) 0.4 MG SL tablet Place 1 tablet (0.4 mg total) under the tongue every 5 (five) minutes as needed for chest pain. 25 tablet 3   sildenafil (VIAGRA) 100 MG tablet Take 100 mg by mouth daily as needed for erectile dysfunction.     tamsulosin (FLOMAX) 0.4 MG CAPS capsule Take 0.4 mg by mouth at bedtime.     vitamin B-12 (CYANOCOBALAMIN) 500 MCG tablet Take 1,000 mcg by mouth daily.     Current Facility-Administered Medications on File Prior to Visit  Medication Dose Route Frequency Provider Last Rate Last Admin   0.9 %  sodium chloride infusion  500 mL Intravenous Once Armbruster, Willaim Rayas, MD       0.9 %  sodium chloride infusion  500 mL Intravenous Once Cirigliano, Vito V, DO        Allergies: No Known Allergies  Vital Signs:  BP 135/74   Pulse (!) 59   Ht 6' (1.829 m)   Wt 216 lb (98 kg)   SpO2 100%   BMI 29.29 kg/m   Neurological Exam: MENTAL STATUS including orientation to time, place, person, recent and remote memory, attention span and concentration, language, and fund of knowledge is normal.  Speech is not dysarthric.  CRANIAL NERVES:  Normal conjugate, extra-ocular eye movements in all directions of gaze.  No ptosis   MOTOR:  Motor strength is 5/5 in all extremities, except distally in the feet with toe extension and flexion 4/5. There is mild bilateral intention tremor of the hands which is more pronounced with finger to nose testing. No pronator drift.  Tone is normal.    MSRs:  Reflexes are 2+/4 throughout, except absent at the ankles.  SENSORY:  Intact to vibration at the knees, reduced below the  ankles.  COORDINATION/GAIT:   Gait is slow, slightly wide-based and stable.  Data: MRI brain wo contrast 01/14/2022: 1. No acute intracranial abnormality. 2. Mild chronic microvascular ischemic disease for age.  CTA head and neck 01/15/2023: CTA neck: 1. A right carotid endarterectomy has been performed since the prior CTA head/neck of 06/16/2016. The common carotid and internal carotid arteries are patent within the neck without hemodynamically significant stenosis. Unchanged 40% stenosis at the origin of the left ICA. 2. Vertebral arteries patent within the neck. Unchanged moderate-to-severe atherosclerotic narrowing at the origin of the non-dominant left vertebral artery.   CTA head: No  intracranial large vessel occlusion or proximal high-grade arterial stenosis identified.    Thank you for allowing me to participate in patient's care.  If I can answer any additional questions, I would be pleased to do so.    Sincerely,    Laurali Goddard K. Allena Katz, DO

## 2023-03-31 NOTE — Patient Instructions (Addendum)
 Start gabapentin 300mg  at bedtime  If you would like to do physical therapy for balance, please contact my office

## 2023-04-01 ENCOUNTER — Encounter: Payer: Self-pay | Admitting: Physician Assistant

## 2023-04-02 ENCOUNTER — Telehealth: Payer: Self-pay | Admitting: Physician Assistant

## 2023-04-02 NOTE — Telephone Encounter (Signed)
 Pt c/o medication issue:  1. Name of Medication: gabapentin (NEURONTIN) 300 MG capsule   2. How are you currently taking this medication (dosage and times per day)? Has not started yet  3. Are you having a reaction (difficulty breathing--STAT)? No  4. What is your medication issue? Pt was prescribed by neuro and wants to know if ok to take due to possible SE

## 2023-04-02 NOTE — Telephone Encounter (Signed)
Spoke with pt, aware of the recommendations.  

## 2023-04-05 ENCOUNTER — Ambulatory Visit: Admitting: Neurology

## 2023-04-20 DIAGNOSIS — H1789 Other corneal scars and opacities: Secondary | ICD-10-CM | POA: Diagnosis not present

## 2023-04-20 DIAGNOSIS — H11001 Unspecified pterygium of right eye: Secondary | ICD-10-CM | POA: Diagnosis not present

## 2023-04-20 DIAGNOSIS — H401134 Primary open-angle glaucoma, bilateral, indeterminate stage: Secondary | ICD-10-CM | POA: Diagnosis not present

## 2023-04-20 DIAGNOSIS — Z961 Presence of intraocular lens: Secondary | ICD-10-CM | POA: Diagnosis not present

## 2023-05-03 ENCOUNTER — Ambulatory Visit (HOSPITAL_COMMUNITY)
Admission: RE | Admit: 2023-05-03 | Discharge: 2023-05-03 | Disposition: A | Discharge status: Home / Self Care | Source: Ambulatory Visit | Attending: Physician Assistant | Admitting: Physician Assistant

## 2023-05-03 ENCOUNTER — Encounter (HOSPITAL_BASED_OUTPATIENT_CLINIC_OR_DEPARTMENT_OTHER): Payer: Self-pay

## 2023-05-03 ENCOUNTER — Ambulatory Visit (HOSPITAL_BASED_OUTPATIENT_CLINIC_OR_DEPARTMENT_OTHER)
Admission: RE | Admit: 2023-05-03 | Discharge: 2023-05-03 | Disposition: A | Discharge status: Home / Self Care | Source: Ambulatory Visit | Attending: Physician Assistant | Admitting: Physician Assistant

## 2023-05-03 DIAGNOSIS — I2510 Atherosclerotic heart disease of native coronary artery without angina pectoris: Secondary | ICD-10-CM | POA: Insufficient documentation

## 2023-05-03 DIAGNOSIS — R0609 Other forms of dyspnea: Secondary | ICD-10-CM | POA: Insufficient documentation

## 2023-05-03 DIAGNOSIS — Z8673 Personal history of transient ischemic attack (TIA), and cerebral infarction without residual deficits: Secondary | ICD-10-CM | POA: Insufficient documentation

## 2023-05-03 LAB — ECHOCARDIOGRAM COMPLETE
AR max vel: 1.97 cm2
AV Peak grad: 10.8 mmHg
Ao pk vel: 1.65 m/s
Area-P 1/2: 4.49 cm2
S' Lateral: 3.58 cm

## 2023-05-04 ENCOUNTER — Encounter (HOSPITAL_BASED_OUTPATIENT_CLINIC_OR_DEPARTMENT_OTHER): Payer: Self-pay

## 2023-05-17 ENCOUNTER — Telehealth: Payer: Self-pay | Admitting: Neurology

## 2023-05-17 NOTE — Telephone Encounter (Signed)
 Pt. States he was prescribed gabapentin  on 3/14 and threw up last thurs and has diarrhea for 3 days and still is having diarrhea today. Is able to drink fluids but would like advice

## 2023-05-17 NOTE — Telephone Encounter (Signed)
 I would not expect gabapentin  to have these side effects several weeks after starting the medication.  He can certainly stop gabapentin  (he's taking gabapentin  300mg  at bedtime), but would recommend follow-up with PCP about GI symptoms.

## 2023-05-18 NOTE — Telephone Encounter (Signed)
 Called patient and informed him of Dr. Basilio Both advice and recommendations. Patient verbalized understanding of all explained to him and he had no further questions or concerns.

## 2023-05-24 ENCOUNTER — Encounter: Payer: Self-pay | Admitting: Cardiology

## 2023-05-24 ENCOUNTER — Other Ambulatory Visit: Payer: Self-pay

## 2023-05-24 ENCOUNTER — Ambulatory Visit: Attending: Cardiology | Admitting: Cardiology

## 2023-05-24 VITALS — BP 126/74 | HR 98 | Ht 71.0 in | Wt 214.2 lb

## 2023-05-24 DIAGNOSIS — R0602 Shortness of breath: Secondary | ICD-10-CM

## 2023-05-24 DIAGNOSIS — I10 Essential (primary) hypertension: Secondary | ICD-10-CM

## 2023-05-24 DIAGNOSIS — E785 Hyperlipidemia, unspecified: Secondary | ICD-10-CM

## 2023-05-24 DIAGNOSIS — I6523 Occlusion and stenosis of bilateral carotid arteries: Secondary | ICD-10-CM | POA: Diagnosis not present

## 2023-05-24 DIAGNOSIS — R001 Bradycardia, unspecified: Secondary | ICD-10-CM | POA: Diagnosis not present

## 2023-05-24 DIAGNOSIS — I2510 Atherosclerotic heart disease of native coronary artery without angina pectoris: Secondary | ICD-10-CM

## 2023-05-24 DIAGNOSIS — R079 Chest pain, unspecified: Secondary | ICD-10-CM

## 2023-05-24 MED ORDER — ATORVASTATIN CALCIUM 80 MG PO TABS: 80.0000 mg | ORAL_TABLET | Freq: Every day | ORAL | 3 refills | Status: AC

## 2023-05-24 NOTE — Progress Notes (Signed)
 This is the attestation for the Lexiscan  Myoview .

## 2023-05-24 NOTE — Addendum Note (Signed)
 Addended by: Zakara Parkey N on: 05/24/2023 09:46 AM   Modules accepted: Orders

## 2023-05-24 NOTE — Progress Notes (Signed)
 Date:  05/24/2023   ID:  Jacob [name], DOB 09-13-1950, MRN [id number]   PCP:  Annette Barters, MD  Cardiologist:  Gaylyn Keas, MD  Electrophysiologist:  None   Chief Complaint:  CAD, HTN, HLD  History of Present Illness:    Jacob [name] is a 73 y.o. male with a hx of multivessel ASCAD s/p CABG and s/p PCI of SVG to RI 2008, dyslipidemia and HTN.   He is here today for followup and is doing well.  About 2-3 months ago he started having a stabbing pain in his chest under the left breast.  Sometimes worse when taking a deep breath in.  There is no other radiation of the pain.  He has been getting SOB when bending over.  The pain occurs when he is exerting himself.  No associated N/V or diaphoresis. He has been having LLE edema for the past 3-4 months after he fell and hurt his leg.  He gets dizzy when he stands up too fast and feels off balance when he walks.  He denies any PND, orthopnea, palpitations or syncope. He is compliant with his meds and is tolerating meds with no SE.    Prior CV studies:   The following studies were reviewed today:  Past Medical History:  Diagnosis Date   CAD (coronary artery disease), native coronary artery     multivessel ASCAD s/p CABG and s/p PCI of SVG to RI 2008   Carotid artery stenosis    patent right CEA site and 1-39% Lcarotid stenosis   Crohn's disease (HCC)    flare 2000   Depression    Dyslipidemia    Dyspnea    ED (erectile dysfunction)    History of blood transfusion 2005   Hypertension    Myocardial infarction [facility]) 2005   5 bypasses   Nephrolithiasis    Stroke [facility])    TIA- Endartarectemy R side   TIA (transient ischemic attack)    Ulcerative colitis (HCC)    Left sided   Past Surgical History:  Procedure Laterality Date   CARDIAC CATHETERIZATION  06/08   Patnet grafts LAD, marginal, distal right   carpel tunnel surgery Left    COLONOSCOPY     COLONOSCOPY WITH PROPOFOL  N/A 10/25/2012   Procedure: COLONOSCOPY WITH  PROPOFOL ;  Surgeon: Garrett Kallman, MD;  Location: [facility];  Service: Endoscopy;  Laterality: N/A;   CORONARY ARTERY BYPASS GRAFT  2005   ASCVD,multivessel,S/P CABG  x5 total per patient   ENDARTERECTOMY Right 06/24/2016   Procedure: ENDARTERECTOMY CAROTID;  Surgeon: Celso College, MD;  Location: [facility] ORS;  Service: Vascular;  Laterality: Right;   HERNIA REPAIR  many years ago   KNEE ARTHROSCOPY Right 1995     Current Meds  Medication Sig   amitriptyline  (ELAVIL ) 25 MG tablet Take 25 mg by mouth at bedtime.   amLODipine  (NORVASC ) 5 MG tablet Take 1 tablet (5 mg total) by mouth daily.   anastrozole (ARIMIDEX) 1 MG tablet Take 0.5 mg by mouth 3 (three) times a week. Mon, wed ,Friday   aspirin  EC 81 MG tablet Take 81 mg by mouth daily. Swallow whole.   atorvastatin  (LIPITOR) 40 MG tablet Take 1 tablet (40 mg total) by mouth daily.   gabapentin  (NEURONTIN ) 300 MG capsule Take 1 capsule (300 mg total) by mouth at bedtime.   glucosamine-chondroitin 500-400 MG tablet Take 1 tablet by mouth daily.   lisinopril  (ZESTRIL ) 20 MG tablet Take 1 tablet (20  mg total) by mouth daily.   metoprolol  succinate (TOPROL -XL) 25 MG 24 hr tablet Take 0.5 tablets (12.5 mg total) by mouth daily.   NATESTO  5.5 MG/ACT GEL Place 1 Application into both nostrils 2 (two) times daily.   nitroGLYCERIN  (NITROSTAT ) 0.4 MG SL tablet Place 1 tablet (0.4 mg total) under the tongue every 5 (five) minutes as needed for chest pain.   sildenafil (VIAGRA) 100 MG tablet Take 100 mg by mouth daily as needed for erectile dysfunction.   tamsulosin (FLOMAX) 0.4 MG CAPS capsule Take 0.4 mg by mouth at bedtime.   vitamin B-12 (CYANOCOBALAMIN ) 500 MCG tablet Take 1,000 mcg by mouth daily.   Current Facility-Administered Medications for the 05/24/23 encounter (Office Visit) with Jacqueline Matsu, MD  Medication   0.9 %  sodium chloride  infusion   0.9 %  sodium chloride  infusion     Allergies:   Patient has no known allergies.    Social History   Tobacco Use   Smoking status: Never   Smokeless tobacco: Current    Types: Snuff  Vaping Use   Vaping status: Never Used  Substance Use Topics   Alcohol use: No   Drug use: No     Family Hx: The patient's family history includes CAD in his brother, brother, brother, father, and sister; Ovarian cancer in his mother. There is no history of Colon cancer, Esophageal cancer, Stomach cancer, Rectal cancer, or Colon polyps.  ROS:   Please see the history of present illness.     All other systems reviewed and are negative.   Labs/Other Tests and Data Reviewed:    Recent Labs: 08/12/2022: TSH 2.21   Recent Lipid Panel Lab Results  Component Value Date/Time   CHOL 155 01/15/2022 03:34 AM   CHOL 126 06/07/2019 09:25 AM   TRIG 95 01/15/2022 03:34 AM   HDL 38 (L) 01/15/2022 03:34 AM   HDL 40 06/07/2019 09:25 AM   CHOLHDL 4.1 01/15/2022 03:34 AM   LDLCALC 98 01/15/2022 03:34 AM   LDLCALC 67 06/07/2019 09:25 AM   LDLDIRECT 71 01/24/2013 09:14 AM    Wt Readings from Last 3 Encounters:  05/24/23 214 lb 3.2 oz (97.2 kg)  03/31/23 216 lb (98 kg)  03/30/23 215 lb (97.5 kg)     Objective:    Vital Signs:  BP 126/74   Pulse 98   Ht 5\' 11"  (1.803 m)   Wt 214 lb 3.2 oz (97.2 kg)   SpO2 98%   BMI 29.87 kg/m   GEN: Well nourished, well developed in no acute distress HEENT: Normal NECK: No JVD; No carotid bruits LYMPHATICS: No lymphadenopathy CARDIAC:RRR, no murmurs, rubs, gallops RESPIRATORY:  Clear to auscultation without rales, wheezing or rhonchi  ABDOMEN: Soft, non-tender, non-distended MUSCULOSKELETAL:  1+ LLE edema; No deformity  SKIN: Warm and dry NEUROLOGIC:  Alert and oriented x 3 PSYCHIATRIC:  Normal affect   Assessment/Plan:  1.  ASCAD  -s/p multivessel ASCAD s/p CABG and s/p PCI of SVG to RI 2008.   -recently has been having sharp CP with SOB that occurs with exertion and resolves with rest -I am going to get a Lexiscan  myoview  to rule  out ischemia -Informed Consent   Shared Decision Making/Informed Consent The risks [chest pain, shortness of breath, cardiac arrhythmias, dizziness, blood pressure fluctuations, myocardial infarction, stroke/transient ischemic attack, nausea, vomiting, allergic reaction, radiation exposure, metallic taste sensation and life-threatening complications (estimated to be 1 in 10,000)], benefits (risk stratification, diagnosing coronary artery disease, treatment guidance)  and alternatives of a nuclear stress test were discussed in detail with Mr. Haran and he agrees to proceed.    -Continue prescription drug management with aspirin  81 mg daily, amlodipine  5 mg daily, Toprol -XL 12.5 mg daily and atorvastatin   with as needed refills   2.  HTN  - BP control on exam today - Continue prescription drug management with amlodipine  5 mg daily, lisinopril  20 mg daily, Toprol -XL 12.5 mg daily with as needed refills I have personally reviewed and interpreted outside labs performed by patient's PCP which showed SCr 1.12 and K+ 4.6 on 03/22/23 -encouraged him to follow a low Na diet   3.  Carotid artery stenosis -CT Angio of the neck 06/16/2016 showed severe near occlusive stenosis of the right ICA and 40 to 50% plaque in the proximal left ICA as well as atheromatous plaque in the left vertebral artery with moderate to severe stenosis.  -Status post right CEA 06/2016.  -Dopplers 05/09/2023 :s/p right CEA with 1-39% stenosis and 1 to 39% left carotid artery stenosis  he will continue aspirin  81 mg daily and atorvastatin     4.  Bradycardia  -drug induced from low dose BB for CAD -asymptomatic  5.  Hyperlipidemia  -LDL goal is less than 70.    -I have personally reviewed and interpreted outside labs performed by patient's PCP which showed LDL 155, HDL 45, ALT 14 03/22/23 -increase Atorvastatin  to 80mg  daily  -repeat FLP and ALT In 6 weeks  6.  LLE edema -has been occurring for several months after a fall -will  check a LE venous doppler   Medication Adjustments/Labs and Tests Ordered: Current medicines are reviewed at length with the patient today.  Concerns regarding medicines are outlined above.  Tests Ordered: No orders of the defined types were placed in this encounter.   Medication Changes: No orders of the defined types were placed in this encounter.    Disposition:  Follow up in 1 year(s)  Signed, Gaylyn Keas, MD  05/24/2023 9:22 AM    [facility]

## 2023-05-24 NOTE — Patient Instructions (Signed)
 Medication Instructions:  Increase atorvastatin  to 80 mg once daily *If you need a refill on your cardiac medications before your next appointment, please call your pharmacy*  Lab Work: Fasting lipid panel, ALT in 6 weeks If you have labs (blood work) drawn today and your tests are completely normal, you will receive your results only by: MyChart Message (if you have MyChart) OR A paper copy in the mail If you have any lab test that is abnormal or we need to change your treatment, we will call you to review the results.  Testing/Procedures: Lexiscan  Myoview  Stress Test Your physician has requested that you have a lexiscan  myoview . For further information please visit [email or web address] Please follow instruction sheet, as given.  Lower Extremity Venous Doppler  Follow-Up: At [facility], you and your health needs are our priority.  As part of our continuing mission to provide you with exceptional heart care, our providers are all part of one team.  This team includes your primary Cardiologist (physician) and Advanced Practice Providers or APPs (Physician Assistants and Nurse Practitioners) who all work together to provide you with the care you need, when you need it.  Your next appointment:   1 year(s)  Provider:   Gaylyn Keas, MD    We recommend signing up for the patient portal called "MyChart".  Sign up information is provided on this After Visit Summary.  MyChart is used to connect with patients for Virtual Visits (Telemedicine).  Patients are able to view lab/test results, encounter notes, upcoming appointments, etc.  Non-urgent messages can be sent to your provider as well.   To learn more about what you can do with MyChart, go to [email or web address].   Other Instructions

## 2023-06-07 ENCOUNTER — Other Ambulatory Visit: Payer: Self-pay | Admitting: Cardiology

## 2023-06-07 DIAGNOSIS — E785 Hyperlipidemia, unspecified: Secondary | ICD-10-CM

## 2023-06-07 DIAGNOSIS — I2510 Atherosclerotic heart disease of native coronary artery without angina pectoris: Secondary | ICD-10-CM

## 2023-06-07 DIAGNOSIS — I6523 Occlusion and stenosis of bilateral carotid arteries: Secondary | ICD-10-CM

## 2023-06-07 DIAGNOSIS — I10 Essential (primary) hypertension: Secondary | ICD-10-CM

## 2023-06-07 DIAGNOSIS — R001 Bradycardia, unspecified: Secondary | ICD-10-CM

## 2023-06-11 ENCOUNTER — Telehealth (HOSPITAL_COMMUNITY): Payer: Self-pay | Admitting: *Deleted

## 2023-06-11 NOTE — Telephone Encounter (Signed)
 Spoke to patient and he was given detailed instructions about his STRESS TEST on 06/18/23 at 10:30. He also stated that he had the printed instructions as well.

## 2023-06-18 ENCOUNTER — Ambulatory Visit: Payer: Self-pay | Admitting: Cardiology

## 2023-06-18 ENCOUNTER — Ambulatory Visit (HOSPITAL_COMMUNITY)
Admission: RE | Admit: 2023-06-18 | Discharge: 2023-06-18 | Disposition: A | Discharge status: Home / Self Care | Source: Ambulatory Visit | Attending: Cardiology | Admitting: Cardiology

## 2023-06-18 DIAGNOSIS — I2510 Atherosclerotic heart disease of native coronary artery without angina pectoris: Secondary | ICD-10-CM

## 2023-06-18 DIAGNOSIS — E785 Hyperlipidemia, unspecified: Secondary | ICD-10-CM | POA: Diagnosis present

## 2023-06-18 DIAGNOSIS — R001 Bradycardia, unspecified: Secondary | ICD-10-CM | POA: Diagnosis not present

## 2023-06-18 DIAGNOSIS — I10 Essential (primary) hypertension: Secondary | ICD-10-CM | POA: Insufficient documentation

## 2023-06-18 DIAGNOSIS — I6523 Occlusion and stenosis of bilateral carotid arteries: Secondary | ICD-10-CM | POA: Diagnosis not present

## 2023-06-18 DIAGNOSIS — R600 Localized edema: Secondary | ICD-10-CM

## 2023-06-18 MED ORDER — TECHNETIUM TC 99M TETROFOSMIN IV KIT
32.3000 | PACK | Freq: Once | INTRAVENOUS | Status: AC | PRN
  Administered 2023-06-18: 32.3 via INTRAVENOUS

## 2023-06-18 MED ORDER — TECHNETIUM TC 99M TETROFOSMIN IV KIT
10.9000 | PACK | Freq: Once | INTRAVENOUS | Status: AC | PRN
Start: 2023-06-18 — End: 2023-06-18
  Administered 2023-06-18: 10.9 via INTRAVENOUS

## 2023-06-18 MED ORDER — REGADENOSON 0.4 MG/5ML IV SOLN
INTRAVENOUS | Status: AC
  Filled 2023-06-18: qty 5

## 2023-06-18 MED ORDER — REGADENOSON 0.4 MG/5ML IV SOLN
0.4000 mg | Freq: Once | INTRAVENOUS | Status: AC
  Administered 2023-06-18: 0.4 mg via INTRAVENOUS

## 2023-06-18 NOTE — Telephone Encounter (Signed)
 Call to patient to advise No evidence of DVT or popliteal cyst bilaterally on ultrasound of BLE. Patient verbalizes understanding.

## 2023-06-18 NOTE — Telephone Encounter (Signed)
-----   Message from Gaylyn Keas sent at 06/18/2023  9:36 AM EDT ----- No evidence of DVT or popliteal cyst bilaterally

## 2023-06-19 LAB — MYOCARDIAL PERFUSION IMAGING
LV dias vol: 108 mL (ref 62–150)
LV sys vol: 44 mL
Nuc Stress EF: 59 %
Peak HR: 80 {beats}/min
Rest HR: 58 {beats}/min
Rest Nuclear Isotope Dose: 10.9 mCi
SDS: 0
SRS: 2
SSS: 0
ST Depression (mm): 0 mm
Stress Nuclear Isotope Dose: 32.3 mCi
TID: 1

## 2023-06-20 ENCOUNTER — Ambulatory Visit: Payer: Self-pay | Admitting: Cardiology

## 2023-06-22 ENCOUNTER — Ambulatory Visit: Admitting: Neurology

## 2023-07-02 NOTE — Telephone Encounter (Signed)
 Patient returned RN's call regarding results.

## 2023-07-02 NOTE — Telephone Encounter (Signed)
 Attempted to call to discuss stress test results. No answer, no updated DPR on file, some #'s in chart not working. LVM on current listed # with no identifiers asking recipient to call [place] at our office #.

## 2023-07-02 NOTE — Telephone Encounter (Signed)
-----   Message from Gaylyn Keas sent at 06/20/2023  8:05 PM EDT ----- Please let patient know that stress test was fine

## 2023-08-01 ENCOUNTER — Other Ambulatory Visit: Payer: Self-pay

## 2023-08-01 ENCOUNTER — Emergency Department
Admission: EM | Admit: 2023-08-01 | Discharge: 2023-08-01 | Disposition: A | Discharge status: Home / Self Care | Attending: Emergency Medicine | Admitting: Emergency Medicine

## 2023-08-01 ENCOUNTER — Emergency Department

## 2023-08-01 DIAGNOSIS — K8020 Calculus of gallbladder without cholecystitis without obstruction: Secondary | ICD-10-CM | POA: Insufficient documentation

## 2023-08-01 DIAGNOSIS — I2510 Atherosclerotic heart disease of native coronary artery without angina pectoris: Secondary | ICD-10-CM | POA: Diagnosis not present

## 2023-08-01 DIAGNOSIS — I700 Atherosclerosis of aorta: Secondary | ICD-10-CM | POA: Insufficient documentation

## 2023-08-01 DIAGNOSIS — M545 Low back pain, unspecified: Secondary | ICD-10-CM | POA: Diagnosis present

## 2023-08-01 DIAGNOSIS — I10 Essential (primary) hypertension: Secondary | ICD-10-CM | POA: Insufficient documentation

## 2023-08-01 LAB — URINALYSIS, ROUTINE W REFLEX MICROSCOPIC
Bilirubin Urine: NEGATIVE
Glucose, UA: NEGATIVE mg/dL
Ketones, ur: NEGATIVE mg/dL
Leukocytes,Ua: NEGATIVE
Nitrite: NEGATIVE
Protein, ur: 30 mg/dL — AB
RBC / HPF: >50 RBC/hpf (ref 0–5)
Specific Gravity, Urine: 1.023 (ref 1.005–1.030)
pH: 5 (ref 5.0–8.0)

## 2023-08-01 LAB — BASIC METABOLIC PANEL WITH GFR
Anion gap: 9 (ref 5–15)
BUN: 14 mg/dL (ref 8–23)
CO2: 23 mmol/L (ref 22–32)
Calcium: 9 mg/dL (ref 8.9–10.3)
Chloride: 106 mmol/L (ref 98–111)
Creatinine, Ser: 0.99 mg/dL (ref 0.61–1.24)
GFR, Estimated: >60 mL/min (ref >60)
Glucose, Bld: 108 mg/dL — ABNORMAL HIGH (ref 70–99)
Potassium: 4 mmol/L (ref 3.5–5.1)
Sodium: 138 mmol/L (ref 135–145)

## 2023-08-01 LAB — CBC
HCT: 38.6 % — ABNORMAL LOW (ref 39.0–52.0)
Hemoglobin: 12.3 g/dL — ABNORMAL LOW (ref 13.0–17.0)
MCH: 29.4 pg (ref 26.0–34.0)
MCHC: 31.9 g/dL (ref 30.0–36.0)
MCV: 92.3 fL (ref 80.0–100.0)
Platelets: 163 K/uL (ref 150–400)
RBC: 4.18 MIL/uL — ABNORMAL LOW (ref 4.22–5.81)
RDW: 13 % (ref 11.5–15.5)
WBC: 6.1 K/uL (ref 4.0–10.5)
nRBC: 0 % (ref 0.0–0.2)

## 2023-08-01 MED ORDER — OXYCODONE-ACETAMINOPHEN 5-325 MG PO TABS: 1.0000 | ORAL_TABLET | Freq: Four times a day (QID) | ORAL | 0 refills | Status: AC | PRN

## 2023-08-01 MED ORDER — NAPROXEN 500 MG PO TABS: 500.0000 mg | ORAL_TABLET | Freq: Two times a day (BID) | ORAL | 0 refills | Status: DC

## 2023-08-01 MED ORDER — KETOROLAC TROMETHAMINE 15 MG/ML IJ SOLN
15.0000 mg | Freq: Once | INTRAMUSCULAR | Status: AC
  Administered 2023-08-01: 15 mg via INTRAVENOUS
  Filled 2023-08-01: qty 1

## 2023-08-01 MED ORDER — SODIUM CHLORIDE 0.9 % IV BOLUS
500.0000 mL | Freq: Once | INTRAVENOUS | Status: AC
  Administered 2023-08-01: 500 mL via INTRAVENOUS

## 2023-08-01 MED ORDER — MORPHINE SULFATE (PF) 4 MG/ML IV SOLN
4.0000 mg | Freq: Once | INTRAVENOUS | Status: AC
  Administered 2023-08-01: 4 mg via INTRAVENOUS
  Filled 2023-08-01: qty 1

## 2023-08-01 MED ORDER — LIDOCAINE 5 % EX PTCH
1.0000 | MEDICATED_PATCH | CUTANEOUS | Status: DC
  Administered 2023-08-01: 1 via TRANSDERMAL
  Filled 2023-08-01: qty 1

## 2023-08-01 MED ORDER — LIDOCAINE 5 % EX PTCH: 1.0000 | MEDICATED_PATCH | CUTANEOUS | 0 refills | Status: AC

## 2023-08-01 NOTE — ED Triage Notes (Addendum)
 Pt to ed from home via [facility] for right sided flank pain x 2 days. Pt is caox4, in no acute distress. Pt has HX of kidney stones. No urinary symptoms. No fever.    150 mcg of fentanyl  22G R Hand   Per EMS pt O2 dropped to 88% after the fentanyl  and was placed on Valentine at 2 liters and improved.

## 2023-08-01 NOTE — ED Notes (Signed)
 Pt taken to scan and they will bring to room once done.

## 2023-08-01 NOTE — ED Notes (Signed)
 Notified Dr.Stafford that patient's Spo2 drops while on room air to 85%. He was previously on 2 L of O2 started by EMS after he desatted post fentanyl  administration, however while trying to take him off the O2 his spo2 dropped from 97% to 85%. Patient placed back on 2 L of O2.

## 2023-08-01 NOTE — ED Provider Notes (Signed)
 [facility] Provider Note    Event Date/Time   First MD Initiated Contact with Patient 08/01/23 1854     (approximate)   History   Chief Complaint: Flank Pain   HPI  Jacob [name] is a 73 y.o. male with a history of TIA, hyperlipidemia who comes ED complaining of right low back pain for the past 2 days.  No injury.  Worse with turning his back to the left and bending.  No trauma, no dysuria.  No vomiting or diarrhea.  Does report a history of kidney stones.        Past Medical History:  Diagnosis Date   CAD (coronary artery disease), native coronary artery     multivessel ASCAD s/p CABG and s/p PCI of SVG to RI 2008   Carotid artery stenosis    patent right CEA site and 1-39% Lcarotid stenosis   Crohn's disease (HCC)    flare 2000   Depression    Dyslipidemia    Dyspnea    ED (erectile dysfunction)    History of blood transfusion 2005   Hypertension    Myocardial infarction [facility]) 2005   5 bypasses   Nephrolithiasis    Stroke [facility])    TIA- Endartarectemy R side   TIA (transient ischemic attack)    Ulcerative colitis (HCC)    Left sided    Current Outpatient Rx   Order #: [id number] Class: Normal   Order #: [id number] Class: Normal   Order #: [id number] Class: Normal   Order #: [id number] Class: Historical Med   Order #: [id number] Class: Normal   Order #: [id number] Class: Historical Med   Order #: [id number] Class: Historical Med   Order #: [id number] Class: Normal   Order #: [id number] Class: Normal   Order #: [id number] Class: Historical Med   Order #: [id number] Class: Normal   Order #: [id number] Class: Normal   Order #: [id number] Class: Historical Med   Order #: [id number] Class: Normal   Order #: [id number] Class: Historical Med   Order #: [id number] Class: Historical Med   Order #: [id number] Class: Historical Med    Past Surgical History:  Procedure Laterality Date   CARDIAC CATHETERIZATION  06/08   Patnet grafts LAD, marginal, distal right    carpel tunnel surgery Left    COLONOSCOPY     COLONOSCOPY WITH PROPOFOL  N/A 10/25/2012   Procedure: COLONOSCOPY WITH PROPOFOL ;  Surgeon: Gladis MARLA Louder, MD;  Location: [facility];  Service: Endoscopy;  Laterality: N/A;   CORONARY ARTERY BYPASS GRAFT  2005   ASCVD,multivessel,S/P CABG  x5 total per patient   ENDARTERECTOMY Right 06/24/2016   Procedure: ENDARTERECTOMY CAROTID;  Surgeon: Marea Selinda RAMAN, MD;  Location: [facility] ORS;  Service: Vascular;  Laterality: Right;   HERNIA REPAIR  many years ago   KNEE ARTHROSCOPY Right 1995    Physical Exam   Triage Vital Signs: ED Triage Vitals  Encounter Vitals Group     BP 08/01/23 1709 (!) 155/85     Girls Systolic BP Percentile --      Girls Diastolic BP Percentile --      Boys Systolic BP Percentile --      Boys Diastolic BP Percentile --      Pulse Rate 08/01/23 1709 78     Resp 08/01/23 1709 16     Temp 08/01/23 1709 98.6 F (37 C)     Temp Source 08/01/23 1709 Oral     SpO2 08/01/23 1709 98 %     Weight --      Height 08/01/23 1707  5' 11 (1.803 m)     Head Circumference --      Peak Flow --      Pain Score 08/01/23 1707 10     Pain Loc --      Pain Education --      Exclude from Growth Chart --     Most recent vital signs: Vitals:   08/01/23 2220 08/01/23 2230  BP:  123/71  Pulse: 71 69  Resp:    Temp:    SpO2: (!) 89% 98%    General: Awake, no distress.  CV:  Good peripheral perfusion.  Regular rate rhythm Resp:  Normal effort.  Clear to auscultation bilaterally Abd:  No distention.  Soft nontender Other:  Right low back musculature tender to the touch and firm, reproducing his symptoms.   ED Results / Procedures / Treatments   Labs (all labs ordered are listed, but only abnormal results are displayed) Labs Reviewed  URINALYSIS, ROUTINE W REFLEX MICROSCOPIC - Abnormal; Notable for the following components:      Result Value   Color, Urine YELLOW (*)    APPearance CLEAR (*)    Hgb urine dipstick MODERATE (*)     Protein, ur 30 (*)    Bacteria, UA RARE (*)    All other components within normal limits  BASIC METABOLIC PANEL WITH GFR - Abnormal; Notable for the following components:   Glucose, Bld 108 (*)    All other components within normal limits  CBC - Abnormal; Notable for the following components:   RBC 4.18 (*)    Hemoglobin 12.3 (*)    HCT 38.6 (*)    All other components within normal limits     EKG    RADIOLOGY CT abdomen pelvis interpreted by me, negative for ureterolithiasis.  Radiology report reviewed   PROCEDURES:  Procedures   MEDICATIONS ORDERED IN ED: Medications  lidocaine  (LIDODERM ) 5 % 1 patch (1 patch Transdermal Patch Applied 08/01/23 1938)  ketorolac  (TORADOL ) 15 MG/ML injection 15 mg (15 mg Intravenous Given 08/01/23 1937)  morphine  (PF) 4 MG/ML injection 4 mg (4 mg Intravenous Given 08/01/23 1938)  sodium chloride  0.9 % bolus 500 mL (0 mLs Intravenous Stopped 08/01/23 2105)     IMPRESSION / MDM / ASSESSMENT AND PLAN / ED COURSE  I reviewed the triage vital signs and the nursing notes.  DDx: Kidney stone, UTI, AKI, muscle spasm.  Doubt aneurysm, dissection, PE  Patient's presentation is most consistent with acute presentation with potential threat to life or bodily function.  Patient presents with right low back pain, reassuring vitals and exam.  Initially after fentanyl  injection saturation dropped down into the high 80s, and he was placed on 2 L nasal cannula.  After observation in the ED, I removed his oxygen, assessed him and over the course of several minutes he was able to speak in full sentences, had clear lungs, and maintained oxygen saturation in the mid to high 90s.  Pain remained consistently located in the right lower back.  Highly unlikely to be PE.  Will manage low back muscle spasm with pain control.  Return precautions discussed.       FINAL CLINICAL IMPRESSION(S) / ED DIAGNOSES   Final diagnoses:  Acute right-sided low back pain without  sciatica     Rx / DC Orders   ED Discharge Orders          Ordered    lidocaine  (LIDODERM ) 5 %  Every 24 hours  08/01/23 2255    oxyCODONE -acetaminophen  (PERCOCET) 5-325 MG tablet  Every 6 hours PRN        08/01/23 2255    naproxen  (NAPROSYN ) 500 MG tablet  2 times daily with meals        08/01/23 2255             Note:  This document was prepared using Dragon voice recognition software and may include unintentional dictation errors.   Viviann Pastor, MD 08/01/23 2351

## 2023-08-04 ENCOUNTER — Encounter: Payer: Self-pay | Admitting: Neurology

## 2023-08-04 ENCOUNTER — Ambulatory Visit: Admitting: Neurology

## 2023-08-04 VITALS — BP 129/79 | HR 58 | Ht 71.0 in | Wt 213.0 lb

## 2023-08-04 DIAGNOSIS — R251 Tremor, unspecified: Secondary | ICD-10-CM

## 2023-08-04 DIAGNOSIS — G629 Polyneuropathy, unspecified: Secondary | ICD-10-CM

## 2023-08-04 NOTE — Progress Notes (Signed)
 Follow-up Visit   Date: 08/04/23   Jacob [name] MRN: [id number] DOB: 1950-09-21   Interim History: Jacob [name] is a 73 y.o. right-handed Caucasian male with  CAD s/p CABG, R CEA, TIA , Crohn's disease, hypertension, hyperlipidemia returning to the clinic for with hand tremor and follow-up of neuropathy.  The patient was accompanied to the clinic by self.   IMPRESSION: Tremor initially felt to be essential tremor, however, today on his exam there is bradykinesias and shuffling gait.  I will check DAT scan to evaluate for parkinson's disease. Neuropathy affecting the feet, idiopathic.  Previously he has B12 deficiency, however symptoms did not improve with supplementation.   PLAN: Check DAT scan Patient informed that medications, such as gabapentin  or Cymbalta, are not effective are managing numbness/cold sensation, as they are used for pain relief PT for balance declined Encouraged to use a cane   Return to clinic in 4 months  ----------------------------------------------------------  History of present illness: Starting around April 2022, he began having imbalance and numbness/tingling involving the toes and balls of the feet. In May, he saw his PCP for these symptoms and was found to have vitamin B12 deficiency (211) and once he started supplemention, his balance has improved. However, the numbness in his feet is unchanged.  Numbness is constant and involves the toes and balls of the feet.  He denies pain or weakness.  Repeat vitamin B12 is normal, per patient.    He has been on disability for heart disease (2005). He lives with wife, 59 year old, and 18 year-old grandson.  UPDATE 12/09/2920:  He is here for follow-up. There has been no change with the numbness of feet. He continues to have thick padding sensation over the balls of the feet. There has been no progression.  No pain or tingling.  Balance is good.  He had a fall off a 6 foot ladder and injury his left  wrist. He got COVID a few weeks ago and has recovered from this.  His B12 level was rechecked by his PCP and improved.  UPDATE 11/28/2021:  He is here for follow-up.  There hsa been no change in the numbness of his feet, which remains at the pads of the toes and ball of the feet.  No associated tingling.  He sometimes has sensation that his feet are cold or wet.  He has noticed weakness of toe movement and imbalance.  Fortunately, no falls. He remains on B12 supplementation. No new complaints.   UPDATE 08/12/2022:  He is here for follow-up visit.  He was hospitalized in December after accidentally drinking vicks vaporizing solution and having two syncopal spells, confusion, and speech difficulty.  Symptoms self-resolved over the following day.  MRI brain did not show any acute changes.  CTA head and neck showed 40% LICA stenosis, s/p right CEA.  Since this time, he has noticed bilateral hand tremor which is worse when he is trying to write or eat.  He is not aware of any family members with tremors.   Numbness in the feet is unchanged and remains below the ankles.  He is compliant with taking vitamin B12 supplements.   UPDATE 03/31/2023:  He is here for follow-up visit.  He reports having worsening pain in the feet where they feel ice cold and hurt.  Numbness is unchanged and involve the mid-foot to the toes.  His tremors are getting worse and bothering him when trying to eat and do fine motor tasks.  Balance  is not very good, he uses a cane at home.  No falls or illnesses.   UPDATE 08/04/2023:  He is here for follow-up visit.  He stopped gabapentin  because of GI upset.  He reports that tremors are much improved without medication.  He continues to have numbness in the feet and cold sensation.  No burning, stabbing, or stinging pain in the feet.  His balance is poor and he tends to fall back into his chair.  He is very careful when walking, fortunately no falls.   Medications:  Current Outpatient  Medications on File Prior to Visit  Medication Sig Dispense Refill   amitriptyline  (ELAVIL ) 25 MG tablet Take 25 mg by mouth at bedtime.     amLODipine  (NORVASC ) 5 MG tablet Take 1 tablet (5 mg total) by mouth daily. 90 tablet 0   anastrozole (ARIMIDEX) 1 MG tablet Take 0.5 mg by mouth 3 (three) times a week. Mon, wed ,Friday     aspirin  EC 81 MG tablet Take 81 mg by mouth daily. Swallow whole.     atorvastatin  (LIPITOR) 80 MG tablet Take 1 tablet (80 mg total) by mouth daily. 90 tablet 3   gabapentin  (NEURONTIN ) 300 MG capsule Take 1 capsule (300 mg total) by mouth at bedtime. 90 capsule 1   glucosamine-chondroitin 500-400 MG tablet Take 1 tablet by mouth daily.     lidocaine  (LIDODERM ) 5 % Place 1 patch onto the skin daily for 15 days. 15 patch 0   lisinopril  (ZESTRIL ) 20 MG tablet Take 1 tablet (20 mg total) by mouth daily. 90 tablet 3   metoprolol  succinate (TOPROL -XL) 25 MG 24 hr tablet Take 0.5 tablets (12.5 mg total) by mouth daily. 45 tablet 3   naproxen  (NAPROSYN ) 500 MG tablet Take 1 tablet (500 mg total) by mouth 2 (two) times daily with a meal. 20 tablet 0   NATESTO  5.5 MG/ACT GEL Place 1 Application into both nostrils 2 (two) times daily.     nitroGLYCERIN  (NITROSTAT ) 0.4 MG SL tablet Place 1 tablet (0.4 mg total) under the tongue every 5 (five) minutes as needed for chest pain. 25 tablet 3   oxyCODONE -acetaminophen  (PERCOCET) 5-325 MG tablet Take 1 tablet by mouth every 6 (six) hours as needed for up to 3 days for severe pain (pain score 7-10). 12 tablet 0   sildenafil (VIAGRA) 100 MG tablet Take 100 mg by mouth daily as needed for erectile dysfunction.     tamsulosin (FLOMAX) 0.4 MG CAPS capsule Take 0.4 mg by mouth at bedtime.     vitamin B-12 (CYANOCOBALAMIN ) 500 MCG tablet Take 1,000 mcg by mouth daily.     Current Facility-Administered Medications on File Prior to Visit  Medication Dose Route Frequency Provider Last Rate Last Admin   0.9 %  sodium chloride  infusion  500 mL  Intravenous Once Armbruster, Elspeth SQUIBB, MD       0.9 %  sodium chloride  infusion  500 mL Intravenous Once Cirigliano, Vito V, DO        Allergies: No Known Allergies  Vital Signs:  BP 129/79   Pulse (!) 58   Ht 5' 11 (1.803 m)   Wt 213 lb (96.6 kg)   SpO2 99%   BMI 29.71 kg/m   Neurological Exam: MENTAL STATUS including orientation to time, place, person, recent and remote memory, attention span and concentration, language, and fund of knowledge is normal.  Speech is not dysarthric.  Blunted affect.  CRANIAL NERVES:  Normal conjugate, extra-ocular eye movements  in all directions of gaze.  No ptosis. Reduced blink.  Face is symmetric.   MOTOR:  Motor strength is 5/5 in all extremities, except distally in the feet with toe extension and flexion 4/5. There is trace tremor of the right hand on exam today.  Tone is normal.     MSRs:  Reflexes are 2+/4 throughout, except absent at the ankles.  SENSORY:  Intact to vibration at the knees, reduced below the ankles.  COORDINATION/GAIT:   There is mild bradykinesia with finger and toe tapping bilaterally.  Upon standing, he became unsteady and slumped back into the chair. Gait is slow, slightly wide-based, shuffling at times.  He turns with 4 steps.    Data: MRI brain wo contrast 01/14/2022: 1. No acute intracranial abnormality. 2. Mild chronic microvascular ischemic disease for age.  CTA head and neck 01/15/2023: CTA neck: 1. A right carotid endarterectomy has been performed since the prior CTA head/neck of 06/16/2016. The common carotid and internal carotid arteries are patent within the neck without hemodynamically significant stenosis. Unchanged 40% stenosis at the origin of the left ICA. 2. Vertebral arteries patent within the neck. Unchanged moderate-to-severe atherosclerotic narrowing at the origin of the non-dominant left vertebral artery.   CTA head: No intracranial large vessel occlusion or proximal high-grade arterial stenosis  identified.    Thank you for allowing me to participate in patient's care.  If I can answer any additional questions, I would be pleased to do so.    Sincerely,    Ulysses Alper K. Tobie, DO

## 2023-08-04 NOTE — Patient Instructions (Addendum)
 We will order DAT scan to evaluate for parkinson's symptoms If you would like to start physical therapy for balance, contact me office As we discussed, we are going to do a DaT scan.  We discussed that this is not a diagnostic scan, but will just give us  some information on dopamine levels in the brain.  Here is some information which may be helpful to you.  Before the Exam  Please tell the nurse, nuclear imaging technician or nuclear medicine physician if you are pregnant, nursing or have reduced liver function. Please also inform us  if you have an allergy or sensitivity to iodine.  The test may be completed with those who are allergic to iodine, but may require pre-medication with other medications to help avoid reactions. If you need to cancel the examination, please give us  at least 24 hours notice.  Before your scan, stop taking these medicines for the length of time shown: Name of Drug Stop Taking  Amoxapine 4 days before  Benztropine  Cogentin 3 days before  Bupropion (Aplenzin, Budeprion, Voxra, Wellbutrin, Zyban) 48 hours before  Buspirone 15 hours before  Citalopram 24 hours before  Cocaine 6 hours before  Escitalopram 24 hours before  Methamphetamine 24 hours before  Methylphenidate (Concerta, Metadate, Methylin, Ritalin) 20 hours before  Paroxetine 24 hours before  Selegilene 48 hours before  Sertraline 3 days before    On the Day of the Exam Drink plenty of fluids and go to the bathroom frequently (and for two days after your exam) Wear loose comfortable clothing, since you will need to lie still for a period of time. Please bring a list of all medications that you are taking; name and dosage. We want to make your waiting time as pleasant as possible. Consider bringing your favorite magazine, book or music player to help you pass the time.  You do not need to stay at the imaging facility the entire time, between the initial injection and the scan itself.   Please leave  your jewelry and valuables at home.  During the Exam The DaTscan once started takes approximately 30-45 minutes. However, following injection of the DaT agent approximately 3-6 hours are required before the agent has achieved appropriate concentration in the brain.  We will inject the DaTscan through an intravenous (IV) line into your arm in the AM, usually around 8-9am, and then you will come back usually in the mid afternoon for the scan. Before the exam, you will receive a drug to allow you to protect the thyroid . For the imaging test, you will be asked to lie on a table and an imaging technologist will position your head in a headrest. A strip of tape or a flexible restraint may be placed around your head to help you to not move your head during the scan. A camera will be positioned above you and you must remain very still for about 30 minute while images are taken. The scanner will be very close to your head, but will not touch your head.

## 2023-08-28 ENCOUNTER — Emergency Department
Admission: EM | Admit: 2023-08-28 | Discharge: 2023-08-28 | Disposition: A | Discharge status: Home / Self Care | Attending: Emergency Medicine | Admitting: Emergency Medicine

## 2023-08-28 ENCOUNTER — Other Ambulatory Visit: Payer: Self-pay

## 2023-08-28 ENCOUNTER — Emergency Department

## 2023-08-28 DIAGNOSIS — M47816 Spondylosis without myelopathy or radiculopathy, lumbar region: Secondary | ICD-10-CM | POA: Diagnosis not present

## 2023-08-28 DIAGNOSIS — M62830 Muscle spasm of back: Secondary | ICD-10-CM | POA: Insufficient documentation

## 2023-08-28 DIAGNOSIS — M5136 Other intervertebral disc degeneration, lumbar region with discogenic back pain only: Secondary | ICD-10-CM | POA: Diagnosis not present

## 2023-08-28 DIAGNOSIS — Z7982 Long term (current) use of aspirin: Secondary | ICD-10-CM | POA: Insufficient documentation

## 2023-08-28 DIAGNOSIS — M545 Low back pain, unspecified: Secondary | ICD-10-CM

## 2023-08-28 DIAGNOSIS — Z79899 Other long term (current) drug therapy: Secondary | ICD-10-CM | POA: Diagnosis not present

## 2023-08-28 LAB — COMPREHENSIVE METABOLIC PANEL WITH GFR
ALT: 16 U/L (ref 0–44)
AST: 20 U/L (ref 15–41)
Albumin: 3.6 g/dL (ref 3.5–5.0)
Alkaline Phosphatase: 79 U/L (ref 38–126)
Anion gap: 12 (ref 5–15)
BUN: 17 mg/dL (ref 8–23)
CO2: 22 mmol/L (ref 22–32)
Calcium: 8.7 mg/dL — ABNORMAL LOW (ref 8.9–10.3)
Chloride: 97 mmol/L — ABNORMAL LOW (ref 98–111)
Creatinine, Ser: 1.1 mg/dL (ref 0.61–1.24)
GFR, Estimated: >60 mL/min (ref >60)
Glucose, Bld: 146 mg/dL — ABNORMAL HIGH (ref 70–99)
Potassium: 3.8 mmol/L (ref 3.5–5.1)
Sodium: 131 mmol/L — ABNORMAL LOW (ref 135–145)
Total Bilirubin: 1 mg/dL (ref 0.0–1.2)
Total Protein: 8.1 g/dL (ref 6.5–8.1)

## 2023-08-28 LAB — CBC
HCT: 33.5 % — ABNORMAL LOW (ref 39.0–52.0)
Hemoglobin: 11 g/dL — ABNORMAL LOW (ref 13.0–17.0)
MCH: 29.1 pg (ref 26.0–34.0)
MCHC: 32.8 g/dL (ref 30.0–36.0)
MCV: 88.6 fL (ref 80.0–100.0)
Platelets: 220 K/uL (ref 150–400)
RBC: 3.78 MIL/uL — ABNORMAL LOW (ref 4.22–5.81)
RDW: 12.7 % (ref 11.5–15.5)
WBC: 9.1 K/uL (ref 4.0–10.5)
nRBC: 0 % (ref 0.0–0.2)

## 2023-08-28 MED ORDER — TIZANIDINE HCL 4 MG PO TABS
4.0000 mg | ORAL_TABLET | Freq: Once | ORAL | Status: AC
  Administered 2023-08-28: 4 mg via ORAL
  Filled 2023-08-28: qty 1

## 2023-08-28 MED ORDER — HYDROCODONE-ACETAMINOPHEN 5-325 MG PO TABS
1.0000 | ORAL_TABLET | ORAL | Status: AC
  Administered 2023-08-28: 1 via ORAL
  Filled 2023-08-28: qty 1

## 2023-08-28 MED ORDER — TIZANIDINE HCL 4 MG PO TABS
4.0000 mg | ORAL_TABLET | Freq: Three times a day (TID) | ORAL | 0 refills | Status: DC
Start: 2023-08-28 — End: 2023-11-08

## 2023-08-28 MED ORDER — HYDROCODONE-ACETAMINOPHEN 5-325 MG PO TABS: 1.0000 | ORAL_TABLET | Freq: Three times a day (TID) | ORAL | 0 refills | Status: DC | PRN

## 2023-08-28 NOTE — ED Notes (Signed)
 Pt taken to XR.

## 2023-08-28 NOTE — Discharge Instructions (Signed)
 Please take medications as prescribed.  Use a heating pad to the lower back.  Apply topical pain patch such as Salonpas daily to help with pain.  Return to the ER for any fevers, increasing pain, weakness, worsening symptoms or urgent changes in your health

## 2023-08-28 NOTE — ED Provider Notes (Signed)
 [place] [facility]PARTMENT AT [facility] Provider Note   CSN: [id number] Arrival date & time: 08/28/23  1257     Patient presents with: Back Pain   Jacob [name] is a 73 y.o. male with history of TIA, hyperlipidemia presents to the emergency department for evaluation of left and right lower back pain.  He states he had a fall 07/30/2023.  He was working in the yard and stumbled landing on his left side but mostly having more right lower back pain.  Pain has moved from left to the right side no numbness tingling or radicular symptoms.  He has occasional muscle spasms.  Denies any abdominal pain nausea vomiting or fevers.  No chest pain or shortness of breath.  Was prescribed a 6-day steroid taper by [facility] with very little relief.  Had a negative CT abdomen and pelvis at the last visit.      Prior to Admission medications   Medication Sig Start Date End Date Taking? Authorizing Provider  HYDROcodone -acetaminophen  (NORCO/VICODIN) 5-325 MG tablet Take 1 tablet by mouth every 8 (eight) hours as needed for severe pain (pain score 7-10). 08/28/23  Yes Charlene Debby BROCKS, PA-C  tiZANidine  (ZANAFLEX ) 4 MG tablet Take 1 tablet (4 mg total) by mouth 3 (three) times daily. 08/28/23  Yes Charlene Debby BROCKS, PA-C  amitriptyline  (ELAVIL ) 25 MG tablet Take 25 mg by mouth at bedtime. 07/24/22   [provider]  amLODipine  (NORVASC ) 5 MG tablet Take 1 tablet (5 mg total) by mouth daily. 11/05/22   Lucien Orren SAILOR, PA-C  anastrozole (ARIMIDEX) 1 MG tablet Take 0.5 mg by mouth 3 (three) times a week. Mon, wed ,Friday 06/20/21   [provider]  aspirin  EC 81 MG tablet Take 81 mg by mouth daily. Swallow whole.    [provider]  atorvastatin  (LIPITOR) 80 MG tablet Take 1 tablet (80 mg total) by mouth daily. 05/24/23   Shlomo Wilbert SAUNDERS, MD  gabapentin  (NEURONTIN ) 300 MG capsule Take 1 capsule (300 mg total) by mouth at bedtime. 03/31/23   Patel, Donika K, DO   glucosamine-chondroitin 500-400 MG tablet Take 1 tablet by mouth daily.    [provider]  lisinopril  (ZESTRIL ) 20 MG tablet Take 1 tablet (20 mg total) by mouth daily. 03/24/23   Lucien Orren SAILOR, PA-C  metoprolol  succinate (TOPROL -XL) 25 MG 24 hr tablet Take 0.5 tablets (12.5 mg total) by mouth daily. 03/24/23   Lucien Orren SAILOR, PA-C  naproxen  (NAPROSYN ) 500 MG tablet Take 1 tablet (500 mg total) by mouth 2 (two) times daily with a meal. 08/01/23   Viviann Pastor, MD  NATESTO  5.5 MG/ACT GEL Place 1 Application into both nostrils 2 (two) times daily. 09/15/21   [provider]  nitroGLYCERIN  (NITROSTAT ) 0.4 MG SL tablet Place 1 tablet (0.4 mg total) under the tongue every 5 (five) minutes as needed for chest pain. 03/24/23   Lucien Orren SAILOR, PA-C  sildenafil (VIAGRA) 100 MG tablet Take 100 mg by mouth daily as needed for erectile dysfunction. 12/13/21   [provider]  tamsulosin  (FLOMAX ) 0.4 MG CAPS capsule Take 0.4 mg by mouth at bedtime. 07/04/21   [provider]  vitamin B-12 (CYANOCOBALAMIN ) 500 MCG tablet Take 1,000 mcg by mouth daily.    [provider]    Allergies: Patient has no known allergies.    Review of Systems  Updated Vital Signs BP (!) 160/90 (BP Location: Right Arm)   Pulse 80   Temp  98.8 F (37.1 C) (Oral)   Resp 20   Ht 5' 11 (1.803 m)   Wt 97.1 kg   SpO2 96%   BMI 29.85 kg/m   Physical Exam Constitutional:      Appearance: He is well-developed.  HENT:     Head: Normocephalic and atraumatic.  Eyes:     Conjunctiva/sclera: Conjunctivae normal.  Cardiovascular:     Rate and Rhythm: Normal rate.  Pulmonary:     Effort: Pulmonary effort is normal. No respiratory distress.  Musculoskeletal:     Cervical back: Normal range of motion.     Comments: Ambulatory with slow nonantalgic gait.  No assistive devices.  Patient with tenderness along the midline of the lower lumbar spine as well as left and right paravertebral  muscles of the lumbar spine..  He has no spinous process tenderness has no SI or sacral tenderness.  No iliac crest tenderness.   He has limited range of motion of the lumbar spine with extension secondary to pain. Hips move well with internal ex rotation with no discomfort.  Neuro vas intact in bilateral lower extremities  Skin:    General: Skin is warm.     Findings: No rash.  Neurological:     Mental Status: He is alert and oriented to person, place, and time.  Psychiatric:        Behavior: Behavior normal.        Thought Content: Thought content normal.     (all labs ordered are listed, but only abnormal results are displayed) Labs Reviewed  CBC - Abnormal; Notable for the following components:      Result Value   RBC 3.78 (*)    Hemoglobin 11.0 (*)    HCT 33.5 (*)    All other components within normal limits  COMPREHENSIVE METABOLIC PANEL WITH GFR - Abnormal; Notable for the following components:   Sodium 131 (*)    Chloride 97 (*)    Glucose, Bld 146 (*)    Calcium  8.7 (*)    All other components within normal limits    EKG: None  Radiology: DG Lumbar Spine 2-3 Views Result Date: 08/28/2023 CLINICAL DATA:  Low back pain. EXAM: LUMBAR SPINE - 2-3 VIEW COMPARISON:  Lumbar radiograph 01/21/2022 FINDINGS: There are 6 non-rib-bearing lumbar vertebra, the lower most lumbar vertebra is hemi transitional with enlarged left transverse process and pseudoarticulation with the sacrum. The lower most lumbar vertebra will be labeled L5 by convention. Broad-based levo scoliotic curvature. Grade 1 anterolisthesis of L4 on L5. No evidence of fracture or compression deformity. Mild diffuse disc space narrowing and spurring. Moderate L4-L5 and mild L5-S1 facet hypertrophy. IMPRESSION: 1. Mild diffuse degenerative disc disease. Moderate L4-L5 and mild L5-S1 facet hypertrophy. 2. Grade 1 anterolisthesis of L4 on L5. 3. Transitional lumbosacral anatomy. Electronically Signed   By: Andrea Gasman  M.D.   On: 08/28/2023 14:37   DG Pelvis 1-2 Views Result Date: 08/28/2023 CLINICAL DATA:  Low back and hip pain. EXAM: PELVIS - 1-2 VIEW COMPARISON:  None Available. FINDINGS: The hip joint spaces are preserved. Femoral heads are well seated. No fracture. No evidence of erosion or focal bone abnormality. No visualized avascular necrosis. The pubic symphysis and sacroiliac joints are congruent. Pelvic phleboliths and vascular calcifications. IMPRESSION: Negative radiograph of the pelvis. Electronically Signed   By: Andrea Gasman M.D.   On: 08/28/2023 14:35     Procedures   Medications Ordered in the ED  tiZANidine  (ZANAFLEX ) tablet 4 mg (4  mg Oral Given 08/28/23 1406)  HYDROcodone -acetaminophen  (NORCO/VICODIN) 5-325 MG per tablet 1 tablet (1 tablet Oral Given 08/28/23 1406)                                    Medical Decision Making Amount and/or Complexity of Data Reviewed Labs: ordered. Radiology: ordered.  Risk Prescription drug management.     73 year old male with lower back pain, muscle spasms.  X-rays of the pelvis and lumbar spine ordered and independently reviewed by me today show no evidence of pelvic fracture, hip fracture or acute compression fracture of the lumbar spine.  There are some chronic degenerative changes of the lumbar spine including disc base degeneration, spondylosis and spondylolisthesis.  He has no weakness or neurological deficits.  Vital signs stable, afebrile.  Blood work within normal limits except for slightly low sodium.  Patient states he has been cutting sodium out of his diet over the last couple weeks.  I have encouraged him to resume some of the sodium back into his diet.  He was given 1 Norco tablet and tizanidine  here in the ED and states that both of these medications have provided him with significant improvement of his back pain and muscle spasms.  He is given a prescription for the Norco and tizanidine .  He is encouraged to use a heating pad and  wife states that they will make an appointment with his spine specialist Dr. Malcolm in [place].  They were given strict return precautions understands signs symptoms return to the ED for. Final diagnoses:  Acute midline low back pain without sciatica  Lumbar paraspinal muscle spasm  Degeneration of intervertebral disc of lumbar region with discogenic back pain  Lumbar spondylosis    ED Discharge Orders          Ordered    HYDROcodone -acetaminophen  (NORCO/VICODIN) 5-325 MG tablet  Every 8 hours PRN        08/28/23 1514    tiZANidine  (ZANAFLEX ) 4 MG tablet  3 times daily        08/28/23 1514               Charlene Debby JAYSON DEVONNA 08/28/23 1519    Levander Slate, MD 08/28/23 [phone]

## 2023-08-28 NOTE — ED Triage Notes (Signed)
 Pt to ED for R lower back pain and hip pain, ongoing for over a week. Pt prefers to stand for VS. Denies urinary symptoms.

## 2023-08-31 ENCOUNTER — Other Ambulatory Visit: Payer: Self-pay

## 2023-08-31 ENCOUNTER — Emergency Department (HOSPITAL_COMMUNITY)

## 2023-08-31 ENCOUNTER — Inpatient Hospital Stay (HOSPITAL_COMMUNITY)

## 2023-08-31 ENCOUNTER — Encounter (HOSPITAL_COMMUNITY): Payer: Self-pay | Admitting: Family Medicine

## 2023-08-31 ENCOUNTER — Inpatient Hospital Stay (HOSPITAL_COMMUNITY)
Admission: EM | Admit: 2023-08-31 | Discharge: 2023-09-10 | DRG: 177 | Disposition: A | Discharge status: Home Health | Attending: Family Medicine | Admitting: Family Medicine

## 2023-08-31 DIAGNOSIS — I10 Essential (primary) hypertension: Secondary | ICD-10-CM | POA: Diagnosis not present

## 2023-08-31 DIAGNOSIS — Z951 Presence of aortocoronary bypass graft: Secondary | ICD-10-CM

## 2023-08-31 DIAGNOSIS — I272 Pulmonary hypertension, unspecified: Secondary | ICD-10-CM | POA: Diagnosis present

## 2023-08-31 DIAGNOSIS — Z7982 Long term (current) use of aspirin: Secondary | ICD-10-CM | POA: Diagnosis not present

## 2023-08-31 DIAGNOSIS — G8929 Other chronic pain: Secondary | ICD-10-CM | POA: Diagnosis present

## 2023-08-31 DIAGNOSIS — Z79899 Other long term (current) drug therapy: Secondary | ICD-10-CM | POA: Diagnosis not present

## 2023-08-31 DIAGNOSIS — D649 Anemia, unspecified: Secondary | ICD-10-CM | POA: Diagnosis not present

## 2023-08-31 DIAGNOSIS — R0781 Pleurodynia: Secondary | ICD-10-CM | POA: Diagnosis not present

## 2023-08-31 DIAGNOSIS — Z7901 Long term (current) use of anticoagulants: Secondary | ICD-10-CM

## 2023-08-31 DIAGNOSIS — R296 Repeated falls: Secondary | ICD-10-CM | POA: Diagnosis present

## 2023-08-31 DIAGNOSIS — J90 Pleural effusion, not elsewhere classified: Secondary | ICD-10-CM | POA: Diagnosis not present

## 2023-08-31 DIAGNOSIS — Z8249 Family history of ischemic heart disease and other diseases of the circulatory system: Secondary | ICD-10-CM | POA: Diagnosis not present

## 2023-08-31 DIAGNOSIS — Y92239 Unspecified place in hospital as the place of occurrence of the external cause: Secondary | ICD-10-CM | POA: Diagnosis not present

## 2023-08-31 DIAGNOSIS — A4101 Sepsis due to Methicillin susceptible Staphylococcus aureus: Secondary | ICD-10-CM | POA: Diagnosis not present

## 2023-08-31 DIAGNOSIS — L740 Miliaria rubra: Secondary | ICD-10-CM | POA: Diagnosis not present

## 2023-08-31 DIAGNOSIS — M47817 Spondylosis without myelopathy or radiculopathy, lumbosacral region: Secondary | ICD-10-CM | POA: Diagnosis present

## 2023-08-31 DIAGNOSIS — Z5948 Other specified lack of adequate food: Secondary | ICD-10-CM

## 2023-08-31 DIAGNOSIS — Z8041 Family history of malignant neoplasm of ovary: Secondary | ICD-10-CM

## 2023-08-31 DIAGNOSIS — M545 Low back pain, unspecified: Secondary | ICD-10-CM | POA: Diagnosis not present

## 2023-08-31 DIAGNOSIS — B9561 Methicillin susceptible Staphylococcus aureus infection as the cause of diseases classified elsewhere: Secondary | ICD-10-CM | POA: Diagnosis present

## 2023-08-31 DIAGNOSIS — J942 Hemothorax: Secondary | ICD-10-CM | POA: Diagnosis not present

## 2023-08-31 DIAGNOSIS — F17200 Nicotine dependence, unspecified, uncomplicated: Secondary | ICD-10-CM | POA: Diagnosis present

## 2023-08-31 DIAGNOSIS — E1140 Type 2 diabetes mellitus with diabetic neuropathy, unspecified: Secondary | ICD-10-CM | POA: Diagnosis present

## 2023-08-31 DIAGNOSIS — M549 Dorsalgia, unspecified: Secondary | ICD-10-CM | POA: Diagnosis not present

## 2023-08-31 DIAGNOSIS — Z87442 Personal history of urinary calculi: Secondary | ICD-10-CM

## 2023-08-31 DIAGNOSIS — R109 Unspecified abdominal pain: Secondary | ICD-10-CM | POA: Diagnosis present

## 2023-08-31 DIAGNOSIS — M51369 Other intervertebral disc degeneration, lumbar region without mention of lumbar back pain or lower extremity pain: Secondary | ICD-10-CM | POA: Diagnosis present

## 2023-08-31 DIAGNOSIS — J918 Pleural effusion in other conditions classified elsewhere: Secondary | ICD-10-CM | POA: Diagnosis present

## 2023-08-31 DIAGNOSIS — I110 Hypertensive heart disease with heart failure: Secondary | ICD-10-CM | POA: Diagnosis present

## 2023-08-31 DIAGNOSIS — R7881 Bacteremia: Secondary | ICD-10-CM | POA: Diagnosis present

## 2023-08-31 DIAGNOSIS — J9601 Acute respiratory failure with hypoxia: Secondary | ICD-10-CM | POA: Diagnosis present

## 2023-08-31 DIAGNOSIS — I2510 Atherosclerotic heart disease of native coronary artery without angina pectoris: Secondary | ICD-10-CM | POA: Diagnosis present

## 2023-08-31 DIAGNOSIS — R42 Dizziness and giddiness: Secondary | ICD-10-CM

## 2023-08-31 DIAGNOSIS — K5903 Drug induced constipation: Secondary | ICD-10-CM | POA: Diagnosis not present

## 2023-08-31 DIAGNOSIS — I2583 Coronary atherosclerosis due to lipid rich plaque: Secondary | ICD-10-CM | POA: Diagnosis not present

## 2023-08-31 DIAGNOSIS — Z8673 Personal history of transient ischemic attack (TIA), and cerebral infarction without residual deficits: Secondary | ICD-10-CM

## 2023-08-31 DIAGNOSIS — K5010 Crohn's disease of large intestine without complications: Secondary | ICD-10-CM | POA: Diagnosis present

## 2023-08-31 DIAGNOSIS — L299 Pruritus, unspecified: Secondary | ICD-10-CM | POA: Diagnosis not present

## 2023-08-31 DIAGNOSIS — E7800 Pure hypercholesterolemia, unspecified: Secondary | ICD-10-CM | POA: Diagnosis not present

## 2023-08-31 DIAGNOSIS — Z9181 History of falling: Secondary | ICD-10-CM

## 2023-08-31 DIAGNOSIS — E785 Hyperlipidemia, unspecified: Secondary | ICD-10-CM | POA: Diagnosis present

## 2023-08-31 DIAGNOSIS — D62 Acute posthemorrhagic anemia: Secondary | ICD-10-CM | POA: Diagnosis not present

## 2023-08-31 DIAGNOSIS — K8020 Calculus of gallbladder without cholecystitis without obstruction: Secondary | ICD-10-CM | POA: Diagnosis present

## 2023-08-31 DIAGNOSIS — R1011 Right upper quadrant pain: Secondary | ICD-10-CM

## 2023-08-31 DIAGNOSIS — K567 Ileus, unspecified: Secondary | ICD-10-CM | POA: Diagnosis not present

## 2023-08-31 DIAGNOSIS — I5031 Acute diastolic (congestive) heart failure: Secondary | ICD-10-CM | POA: Diagnosis present

## 2023-08-31 DIAGNOSIS — R091 Pleurisy: Secondary | ICD-10-CM | POA: Diagnosis not present

## 2023-08-31 DIAGNOSIS — N170 Acute kidney failure with tubular necrosis: Secondary | ICD-10-CM | POA: Diagnosis present

## 2023-08-31 DIAGNOSIS — Z5941 Food insecurity: Secondary | ICD-10-CM

## 2023-08-31 DIAGNOSIS — I4891 Unspecified atrial fibrillation: Principal | ICD-10-CM | POA: Diagnosis present

## 2023-08-31 DIAGNOSIS — J9811 Atelectasis: Secondary | ICD-10-CM | POA: Diagnosis present

## 2023-08-31 DIAGNOSIS — N179 Acute kidney failure, unspecified: Secondary | ICD-10-CM | POA: Diagnosis not present

## 2023-08-31 DIAGNOSIS — R0603 Acute respiratory distress: Secondary | ICD-10-CM | POA: Diagnosis not present

## 2023-08-31 DIAGNOSIS — T40605A Adverse effect of unspecified narcotics, initial encounter: Secondary | ICD-10-CM | POA: Diagnosis not present

## 2023-08-31 DIAGNOSIS — J869 Pyothorax without fistula: Principal | ICD-10-CM | POA: Diagnosis present

## 2023-08-31 DIAGNOSIS — I493 Ventricular premature depolarization: Secondary | ICD-10-CM | POA: Diagnosis present

## 2023-08-31 DIAGNOSIS — I252 Old myocardial infarction: Secondary | ICD-10-CM

## 2023-08-31 DIAGNOSIS — R0902 Hypoxemia: Secondary | ICD-10-CM | POA: Diagnosis not present

## 2023-08-31 DIAGNOSIS — Z9861 Coronary angioplasty status: Secondary | ICD-10-CM

## 2023-08-31 DIAGNOSIS — R899 Unspecified abnormal finding in specimens from other organs, systems and tissues: Secondary | ICD-10-CM | POA: Diagnosis not present

## 2023-08-31 LAB — URINALYSIS, ROUTINE W REFLEX MICROSCOPIC
Bacteria, UA: NONE SEEN
Bilirubin Urine: NEGATIVE
Glucose, UA: NEGATIVE mg/dL
Hgb urine dipstick: NEGATIVE
Ketones, ur: NEGATIVE mg/dL
Leukocytes,Ua: NEGATIVE
Nitrite: NEGATIVE
Protein, ur: 30 mg/dL — AB
Specific Gravity, Urine: 1.026 (ref 1.005–1.030)
pH: 5 (ref 5.0–8.0)

## 2023-08-31 LAB — CBC WITH DIFFERENTIAL/PLATELET
Abs Immature Granulocytes: 0.08 K/uL — ABNORMAL HIGH (ref 0.00–0.07)
Basophils Absolute: 0 K/uL (ref 0.0–0.1)
Basophils Relative: 0 %
Eosinophils Absolute: 0.2 K/uL (ref 0.0–0.5)
Eosinophils Relative: 2 %
HCT: 34.3 % — ABNORMAL LOW (ref 39.0–52.0)
Hemoglobin: 11.3 g/dL — ABNORMAL LOW (ref 13.0–17.0)
Immature Granulocytes: 1 %
Lymphocytes Relative: 14 %
Lymphs Abs: 1.3 K/uL (ref 0.7–4.0)
MCH: 29.6 pg (ref 26.0–34.0)
MCHC: 32.9 g/dL (ref 30.0–36.0)
MCV: 89.8 fL (ref 80.0–100.0)
Monocytes Absolute: 0.9 K/uL (ref 0.1–1.0)
Monocytes Relative: 10 %
Neutro Abs: 6.7 K/uL (ref 1.7–7.7)
Neutrophils Relative %: 73 %
Platelets: 267 K/uL (ref 150–400)
RBC: 3.82 MIL/uL — ABNORMAL LOW (ref 4.22–5.81)
RDW: 12.8 % (ref 11.5–15.5)
WBC: 9.1 K/uL (ref 4.0–10.5)
nRBC: 0 % (ref 0.0–0.2)

## 2023-08-31 LAB — COMPREHENSIVE METABOLIC PANEL WITH GFR
ALT: 23 U/L (ref 0–44)
AST: 23 U/L (ref 15–41)
Albumin: 2.7 g/dL — ABNORMAL LOW (ref 3.5–5.0)
Alkaline Phosphatase: 71 U/L (ref 38–126)
Anion gap: 11 (ref 5–15)
BUN: 23 mg/dL (ref 8–23)
CO2: 24 mmol/L (ref 22–32)
Calcium: 8.5 mg/dL — ABNORMAL LOW (ref 8.9–10.3)
Chloride: 102 mmol/L (ref 98–111)
Creatinine, Ser: 1.15 mg/dL (ref 0.61–1.24)
GFR, Estimated: >60 mL/min (ref >60)
Glucose, Bld: 108 mg/dL — ABNORMAL HIGH (ref 70–99)
Potassium: 3.9 mmol/L (ref 3.5–5.1)
Sodium: 137 mmol/L (ref 135–145)
Total Bilirubin: 0.5 mg/dL (ref 0.0–1.2)
Total Protein: 6.9 g/dL (ref 6.5–8.1)

## 2023-08-31 LAB — TROPONIN I (HIGH SENSITIVITY)
Troponin I (High Sensitivity): 5 ng/L (ref <18)
Troponin I (High Sensitivity): 4 ng/L (ref <18)

## 2023-08-31 LAB — D-DIMER, QUANTITATIVE: D-Dimer, Quant: 1.5 ug{FEU}/mL — ABNORMAL HIGH (ref 0.00–0.50)

## 2023-08-31 LAB — BRAIN NATRIURETIC PEPTIDE: B Natriuretic Peptide: 154.1 pg/mL — ABNORMAL HIGH (ref 0.0–100.0)

## 2023-08-31 LAB — MAGNESIUM: Magnesium: 2 mg/dL (ref 1.7–2.4)

## 2023-08-31 LAB — LIPASE, BLOOD: Lipase: 40 U/L (ref 11–51)

## 2023-08-31 MED ORDER — SODIUM CHLORIDE 0.9 % IV BOLUS
1000.0000 mL | Freq: Once | INTRAVENOUS | Status: AC
  Administered 2023-08-31 (×2): 1000 mL via INTRAVENOUS

## 2023-08-31 MED ORDER — IOHEXOL 350 MG/ML SOLN
75.0000 mL | Freq: Once | INTRAVENOUS | Status: AC | PRN
  Administered 2023-08-31 (×2): 75 mL via INTRAVENOUS

## 2023-08-31 MED ORDER — HYDROMORPHONE HCL 1 MG/ML IJ SOLN
1.0000 mg | Freq: Once | INTRAMUSCULAR | Status: AC
  Administered 2023-08-31 (×2): 1 mg via INTRAVENOUS
  Filled 2023-08-31: qty 1

## 2023-08-31 MED ORDER — DILTIAZEM HCL-DEXTROSE 125-5 MG/125ML-% IV SOLN (PREMIX)
5.0000 mg/h | INTRAVENOUS | Status: DC
  Administered 2023-08-31 (×2): 5 mg/h via INTRAVENOUS
  Administered 2023-09-01 (×4): 12.5 mg/h via INTRAVENOUS
  Filled 2023-08-31 (×2): qty 125

## 2023-08-31 MED ORDER — DILTIAZEM LOAD VIA INFUSION
10.0000 mg | Freq: Once | INTRAVENOUS | Status: AC
  Administered 2023-08-31 (×2): 10 mg via INTRAVENOUS
  Filled 2023-08-31: qty 10

## 2023-08-31 MED ORDER — DILTIAZEM HCL-DEXTROSE 125-5 MG/125ML-% IV SOLN (PREMIX)
5.0000 mg/h | INTRAVENOUS | Status: DC
  Filled 2023-08-31: qty 125

## 2023-08-31 MED ORDER — MORPHINE SULFATE (PF) 4 MG/ML IV SOLN
4.0000 mg | Freq: Once | INTRAVENOUS | Status: AC
  Administered 2023-08-31 (×2): 4 mg via INTRAVENOUS
  Filled 2023-08-31: qty 1

## 2023-08-31 NOTE — ED Provider Notes (Signed)
 [place] [facility] AT [facility] Provider Note   CSN: [id number] Arrival date & time: 08/31/23  8251     Patient presents with: Abdominal Pain, Back Pain, and Dizziness   Jacob [name] is a 73 y.o. male.   Patient here for several issues.  He is having abdominal pain low back pain some chronic dizziness possibly new A-fib with RVR per EMS.  He has been dealing with low back pain here over the last few weeks.  Has had multiple workups in not much relief of his pain.  He denies any loss of bowel or bladder.  No pain with urination.  No saddle anesthesia.  He is also been having some right upper quadrant abdominal pain at times/right-sided rib pain.  He hit that area may be fallen over recently.  He supposed to be getting a workup tomorrow with a nuclear medicine scan for his dizziness.  He denies any speech changes weakness numbness tingling but he has been having balance issues.  He does not think it is related to his back pain.  He denies any chest pain shortness of breath.  No history of A-fib.  Not on any blood thinners.  The history is provided by the patient.       Prior to Admission medications   Medication Sig Start Date End Date Taking? Authorizing Provider  amitriptyline  (ELAVIL ) 25 MG tablet Take 25 mg by mouth at bedtime. 07/24/22   [provider]  amLODipine  (NORVASC ) 5 MG tablet Take 1 tablet (5 mg total) by mouth daily. 11/05/22   Lucien Orren SAILOR, PA-C  anastrozole (ARIMIDEX) 1 MG tablet Take 0.5 mg by mouth 3 (three) times a week. Mon, wed ,Friday 06/20/21   [provider]  aspirin  EC 81 MG tablet Take 81 mg by mouth daily. Swallow whole.    [provider]  atorvastatin  (LIPITOR) 80 MG tablet Take 1 tablet (80 mg total) by mouth daily. 05/24/23   Shlomo Wilbert SAUNDERS, MD  gabapentin  (NEURONTIN ) 300 MG capsule Take 1 capsule (300 mg total) by mouth at bedtime. 03/31/23   Patel, Donika K, DO  glucosamine-chondroitin 500-400 MG  tablet Take 1 tablet by mouth daily.    [provider]  HYDROcodone -acetaminophen  (NORCO/VICODIN) 5-325 MG tablet Take 1 tablet by mouth every 8 (eight) hours as needed for severe pain (pain score 7-10). 08/28/23   Charlene Debby BROCKS, PA-C  lisinopril  (ZESTRIL ) 20 MG tablet Take 1 tablet (20 mg total) by mouth daily. 03/24/23   Lucien Orren SAILOR, PA-C  metoprolol  succinate (TOPROL -XL) 25 MG 24 hr tablet Take 0.5 tablets (12.5 mg total) by mouth daily. 03/24/23   Lucien Orren SAILOR, PA-C  naproxen  (NAPROSYN ) 500 MG tablet Take 1 tablet (500 mg total) by mouth 2 (two) times daily with a meal. 08/01/23   Viviann Pastor, MD  NATESTO  5.5 MG/ACT GEL Place 1 Application into both nostrils 2 (two) times daily. 09/15/21   [provider]  nitroGLYCERIN  (NITROSTAT ) 0.4 MG SL tablet Place 1 tablet (0.4 mg total) under the tongue every 5 (five) minutes as needed for chest pain. 03/24/23   Lucien Orren SAILOR, PA-C  sildenafil (VIAGRA) 100 MG tablet Take 100 mg by mouth daily as needed for erectile dysfunction. 12/13/21   [provider]  tamsulosin  (FLOMAX ) 0.4 MG CAPS capsule Take 0.4 mg by mouth at bedtime. 07/04/21   [provider]  tiZANidine  (ZANAFLEX ) 4 MG tablet Take 1 tablet (4 mg total) by mouth 3 (three)  times daily. 08/28/23   Charlene Debby BROCKS, PA-C  vitamin B-12 (CYANOCOBALAMIN ) 500 MCG tablet Take 1,000 mcg by mouth daily.    [provider]    Allergies: Patient has no known allergies.    Review of Systems  Updated Vital Signs BP 110/84   Pulse (!) 122   Temp 99.2 F (37.3 C) (Axillary)   Resp (!) 24   Ht 5' 11 (1.803 m)   Wt 91.6 kg   SpO2 96%   BMI 28.17 kg/m   Physical Exam Vitals and nursing note reviewed.  Constitutional:      General: He is in acute distress.     Appearance: He is well-developed. He is ill-appearing.  HENT:     Head: Normocephalic and atraumatic.     Mouth/Throat:     Mouth: Mucous membranes are moist.  Eyes:     Extraocular  Movements: Extraocular movements intact.     Conjunctiva/sclera: Conjunctivae normal.     Pupils: Pupils are equal, round, and reactive to light.  Cardiovascular:     Rate and Rhythm: Normal rate and regular rhythm.     Heart sounds: Normal heart sounds. No murmur heard. Pulmonary:     Effort: Pulmonary effort is normal. No respiratory distress.     Breath sounds: Normal breath sounds.  Abdominal:     Palpations: Abdomen is soft.     Tenderness: There is abdominal tenderness in the right upper quadrant. There is right CVA tenderness.  Musculoskeletal:        General: No swelling.     Cervical back: Neck supple.     Comments: Tenderness to paraspinal lumbar muscles bilaterally  Skin:    General: Skin is warm and dry.     Capillary Refill: Capillary refill takes less than 2 seconds.  Neurological:     General: No focal deficit present.     Mental Status: He is alert and oriented to person, place, and time.     Motor: No weakness.     Comments: 5+ out of 5 strength throughout, normal sensation, no drift, normal finger-nose-finger, normal speech  Psychiatric:        Mood and Affect: Mood normal.     (all labs ordered are listed, but only abnormal results are displayed) Labs Reviewed  CBC WITH DIFFERENTIAL/PLATELET - Abnormal; Notable for the following components:      Result Value   RBC 3.82 (*)    Hemoglobin 11.3 (*)    HCT 34.3 (*)    Abs Immature Granulocytes 0.08 (*)    All other components within normal limits  COMPREHENSIVE METABOLIC PANEL WITH GFR - Abnormal; Notable for the following components:   Glucose, Bld 108 (*)    Calcium  8.5 (*)    Albumin 2.7 (*)    All other components within normal limits  URINALYSIS, ROUTINE W REFLEX MICROSCOPIC - Abnormal; Notable for the following components:   Color, Urine AMBER (*)    APPearance HAZY (*)    Protein, ur 30 (*)    All other components within normal limits  D-DIMER, QUANTITATIVE - Abnormal; Notable for the following  components:   D-Dimer, Quant 1.50 (*)    All other components within normal limits  BRAIN NATRIURETIC PEPTIDE - Abnormal; Notable for the following components:   B Natriuretic Peptide 154.1 (*)    All other components within normal limits  MAGNESIUM   LIPASE, BLOOD  TROPONIN I (HIGH SENSITIVITY)  TROPONIN I (HIGH SENSITIVITY)    EKG: EKG Interpretation  Date/Time:  Tuesday August 31 2023 17:52:23 EDT Ventricular Rate:  120 PR Interval:    QRS Duration:  91 QT Interval:  294 QTC Calculation: 416 R Axis:   27  Text Interpretation: Atrial fibrillation / Artifcat Ventricular premature complex Borderline repolarization abnormality Baseline wander in lead(s) III V1 V2 V3 V4 V5 V6 Reconfirmed by Ruthe Cornet [phone]) on 08/31/2023 5:57:04 PM  Radiology: CT Angio Chest PE W and/or Wo Contrast Result Date: 08/31/2023 CLINICAL DATA:  Probability pulmonary embolism EXAM: CT ANGIOGRAPHY CHEST WITH CONTRAST TECHNIQUE: Multidetector CT imaging of the chest was performed using the standard protocol during bolus administration of intravenous contrast. Multiplanar CT image reconstructions and MIPs were obtained to evaluate the vascular anatomy. RADIATION DOSE REDUCTION: This exam was performed according to the departmental dose-optimization program which includes automated exposure control, adjustment of the mA and/or kV according to patient size and/or use of iterative reconstruction technique. CONTRAST:  75mL OMNIPAQUE  IOHEXOL  350 MG/ML SOLN COMPARISON:  None Available. FINDINGS: Cardiovascular: There is adequate opacification of the pulmonary arterial tree. No intraluminal filling defect identified to suggest acute pulmonary embolism. The central pulmonary arteries are enlarged in keeping with changes of pulmonary arterial hypertension. Coronary artery bypass grafting has been performed. Mild global cardiomegaly. No pericardial effusion. No significant atherosclerotic calcification within the thoracic  aorta. No aortic aneurysm. Mediastinum/Nodes: No enlarged mediastinal, hilar, or axillary lymph nodes. Thyroid  gland, trachea, and esophagus demonstrate no significant findings. Lungs/Pleura: Moderate right pleural effusion with associated right basilar dependent atelectasis. No superimposed confluent pulmonary infiltrate. No pneumothorax. No pleural effusion on the left. No central obstructing lesion. Upper Abdomen: No acute abnormality. Musculoskeletal: No chest wall abnormality. No acute or significant osseous findings. Review of the MIP images confirms the above findings. IMPRESSION: 1. No pulmonary embolism. 2. Morphologic changes in keeping with pulmonary arterial hypertension. 3. Mild global cardiomegaly. Status post coronary artery bypass grafting. 4. Moderate right pleural effusion with associated right basilar dependent atelectasis. Electronically Signed   By: Dorethia Molt M.D.   On: 08/31/2023 22:04   CT ABDOMEN PELVIS W CONTRAST Result Date: 08/31/2023 CLINICAL DATA:  Acute abdominal pain EXAM: CT ABDOMEN AND PELVIS WITH CONTRAST TECHNIQUE: Multidetector CT imaging of the abdomen and pelvis was performed using the standard protocol following bolus administration of intravenous contrast. RADIATION DOSE REDUCTION: This exam was performed according to the departmental dose-optimization program which includes automated exposure control, adjustment of the mA and/or kV according to patient size and/or use of iterative reconstruction technique. CONTRAST:  75mL OMNIPAQUE  IOHEXOL  350 MG/ML SOLN COMPARISON:  None Available. FINDINGS: Hepatobiliary: Gallbladder is well distended with multiple dependent gallstones. The liver is within normal limits. Pancreas: Unremarkable. No pancreatic ductal dilatation or surrounding inflammatory changes. Spleen: Normal in size without focal abnormality. Adrenals/Urinary Tract: Adrenal glands are within normal limits bilaterally. Kidneys demonstrate a normal enhancement  pattern bilaterally. Few small cysts are noted stable from the prior exam. No follow-up is recommended. No obstructive changes are seen. The bladder is partially distended. Stomach/Bowel: Colon is distended with air although no obstructive or inflammatory changes are seen. Mild scattered fecal material is noted. The appendix is not well visualized. No inflammatory changes to suggest appendicitis are noted. Small bowel and stomach are within normal limits. Vascular/Lymphatic: Aortic atherosclerosis. No enlarged abdominal or pelvic lymph nodes. Reproductive: Prostate is unremarkable. Other: No abdominal wall hernia or abnormality. No abdominopelvic ascites. Musculoskeletal: No acute or significant osseous findings. IMPRESSION: Cholelithiasis without complicating factors. No other focal abnormality is seen. Electronically Signed  By: Oneil Devonshire M.D.   On: 08/31/2023 21:54   CT L-SPINE NO CHARGE Result Date: 08/31/2023 CLINICAL DATA:  Low back pain EXAM: CT LUMBAR SPINE WITHOUT CONTRAST TECHNIQUE: Multidetector CT imaging of the lumbar spine was performed without intravenous contrast administration. Multiplanar CT image reconstructions were also generated. RADIATION DOSE REDUCTION: This exam was performed according to the departmental dose-optimization program which includes automated exposure control, adjustment of the mA and/or kV according to patient size and/or use of iterative reconstruction technique. COMPARISON:  None Available. FINDINGS: Segmentation: Transitional lumbar anatomy with a lumbarized S1 present and a relatively well developed disc space at S1-2. Alignment: 2-3 mm anterolisthesis L5-S1. Vertebrae: No acute fracture or focal pathologic process. Paraspinal and other soft tissues: See report for accompanying CT examination of the abdomen and pelvis. No paraspinal inflammatory change or fluid collection identified. Disc levels: Intervertebral disc heights are preserved. Spinal canal is widely  patent. Osteophytic spurring results in mild bilateral neuroforaminal narrowing at L4-5 and on the right at L5-S1. Remaining neural foramina are widely patent. Mild to moderate facet arthrosis at L5-S1, left greater than right. IMPRESSION: 1. No acute fracture or listhesis of the lumbar spine. 2. Transitional lumbar anatomy with a lumbarized S1 present and a relatively well developed disc space at S1-2. 3. Mild to moderate facet arthrosis at L5-S1, left greater than right. 4. Mild bilateral neuroforaminal narrowing at L4-5 and on the right at L5-S1. Electronically Signed   By: Dorethia Molt M.D.   On: 08/31/2023 21:53   CT Head Wo Contrast Result Date: 08/31/2023 CLINICAL DATA:  Altered mental status EXAM: CT HEAD WITHOUT CONTRAST TECHNIQUE: Contiguous axial images were obtained from the base of the skull through the vertex without intravenous contrast. RADIATION DOSE REDUCTION: This exam was performed according to the departmental dose-optimization program which includes automated exposure control, adjustment of the mA and/or kV according to patient size and/or use of iterative reconstruction technique. COMPARISON:  None Available. FINDINGS: Brain: No evidence of acute infarction, hemorrhage, hydrocephalus, extra-axial collection or mass lesion/mass effect. Vascular: No hyperdense vessel or unexpected calcification. Skull: Normal. Negative for fracture or focal lesion. Sinuses/Orbits: No acute finding. Other: Mastoid air cells and middle ear cavities are clear. IMPRESSION: 1. No acute intracranial abnormality. Electronically Signed   By: Dorethia Molt M.D.   On: 08/31/2023 21:47   DG Chest Portable 1 View Result Date: 08/31/2023 CLINICAL DATA:  Shortness of breath EXAM: PORTABLE CHEST 1 VIEW COMPARISON:  01/14/2022 FINDINGS: Cardiac shadow is enlarged. Postsurgical changes are again seen. The overall inspiratory effort is poor. Small right pleural effusion is noted with basilar atelectasis. No bony  abnormality is noted. IMPRESSION: Right basilar atelectasis and small effusion. Electronically Signed   By: Oneil Devonshire M.D.   On: 08/31/2023 19:04     .Critical Care  Performed by: Ruthe Cornet, DO Authorized by: Ruthe Cornet, DO   Critical care provider statement:    Critical care time (minutes):  35   Critical care was necessary to treat or prevent imminent or life-threatening deterioration of the following conditions: atrial fibrillation with rvr.   Critical care was time spent personally by me on the following activities:  Blood draw for specimens, development of treatment plan with patient or surrogate, discussions with primary provider, evaluation of patient's response to treatment, examination of patient, obtaining history from patient or surrogate, ordering and performing treatments and interventions, ordering and review of laboratory studies, ordering and review of radiographic studies, pulse oximetry, re-evaluation of patient's condition  and review of old charts   I assumed direction of critical care for this patient from another provider in my specialty: no      Medications Ordered in the ED  diltiazem  (CARDIZEM ) 1 mg/mL load via infusion 10 mg (10 mg Intravenous Bolus from Bag 08/31/23 1942)    And  diltiazem  (CARDIZEM ) 125 mg in dextrose  5% 125 mL (1 mg/mL) infusion (7.5 mg/hr Intravenous Infusion Verify 08/31/23 2234)  morphine  (PF) 4 MG/ML injection 4 mg (4 mg Intravenous Given 08/31/23 1936)  sodium chloride  0.9 % bolus 1,000 mL (0 mLs Intravenous Stopped 08/31/23 2036)  iohexol  (OMNIPAQUE ) 350 MG/ML injection 75 mL (75 mLs Intravenous Contrast Given 08/31/23 2057)  HYDROmorphone  (DILAUDID ) injection 1 mg (1 mg Intravenous Given 08/31/23 2118)                                    Medical Decision Making Amount and/or Complexity of Data Reviewed Labs: ordered. Radiology: ordered.  Risk Prescription drug management. Decision regarding hospitalization.   Jacob [name] is here for multiple issues.  History of hypertension and CAD stroke Crohn's.  He arrives with A-fib with RVR per my review interpretation of EKG.  No history of the same.  He has been dealing with some low back pain for the last several weeks.  Goes down his legs at times mostly the right leg.  He has been seen multiple times for this.  He also hit the right side of his ribs right upper abdomen from a fall where he did not land on the ground here recently as well.  He is having some pain in the right rib area right upper abdomen.  He has been dealing with some chronic dizziness as well recently and supposed to get some sort of PET scan of his brain tomorrow with neurology.  Ultimately looks like he is in new A-fib.  He does not have any cauda equina symptoms.  He is having pain to his right flank right ribs.  He is in distress.  He is neurologically intact.  Differential diagnosis back pain likely musculoskeletal/radicular probably herniated disc but seems less likely to be cauda equina.  New A-fib somewhat incidental finding.  Will start him on diltiazem  infusion and drip as heart rates in the 150s.  Will give morphine .  Overall differential could be infectious process, heart failure less likely ACS PE bowel obstruction.  Will get CBC CMP lipase urinalysis troponin BNP D-dimer chest x-ray.  D-dimer is elevated will get CT scan of chest.  Will get CT scan abdomen pelvis as well.  Troponin is normal.  He has no significant leukocytosis anemia or electrolyte abnormality otherwise.  Chest x-ray somewhat unremarkable.  No obvious pneumonia.  Maybe a small pleural effusion.  Overall CT scan of the head was unremarkable per radiology report.  Gallstones on the CT scan abdomen pelvis but otherwise no acute findings.  He does have some disc disease in his lumbar spine.  Lab work for my review and interpretation showed no significant leukocytosis anemia or electrolyte abnormality.  Troponin and BNP  unremarkable.  PE scan was negative for PE.  No rib fracture.  Small pleural effusion.  Ultimately will admit him for A-fib with RVR.  Will get a right upper quadrant ultrasound to further evaluate his gallstones but seems less likely to be cholecystitis with no fever no white count normal liver enzymes and gallbladder enzymes.  I will go ahead and get an MRI of his brain and MRI of his low back to further evaluate for stroke or herniated disc.  Clinically have very low concern for cauda equina.  Ultimately he will be admitted to hospital service for further care.  He still in A-fib in the 120s and will need further titration of his diltiazem .  This chart was dictated using voice recognition software.  Despite best efforts to proofread,  errors can occur which can change the documentation meaning.      Final diagnoses:  Atrial fibrillation with RVR (HCC)  Dizziness  Acute low back pain, unspecified back pain laterality, unspecified whether sciatica present  Right upper quadrant abdominal pain    ED Discharge Orders     None          Ruthe Cornet, DO 08/31/23 2236

## 2023-08-31 NOTE — ED Triage Notes (Signed)
 According to [facility]:Pt is coming from home, pt awoke this am with pain from hip to hip extending half way up his back. Pt has a previous back injury from fall that flairs up every once in a while. Pt tried to get into car to seek help but was unable to with wife help. Pt does not have any new trauma today, pt was given pain management being given 200 mcg of fentanyl . When placed on monitor it was found that he has afib rvr, pt reports he does not have afib history but he does take a blood thinner and metoprolol . Pt has had 800 ml of ns. Pt was given 2 L of oxygen after SpO2 dropped to 92% following fentanyl .  20 piv in right ac  Vitals BP 118/70 Hr afib rvr 115-160 RR 30, 2nd 20 Cap 35 Cbg 103

## 2023-08-31 NOTE — ED Notes (Signed)
 Pt talking to provider RN will complete triage when provider finished.

## 2023-09-01 ENCOUNTER — Encounter (HOSPITAL_COMMUNITY): Admission: RE | Admit: 2023-09-01 | Source: Ambulatory Visit

## 2023-09-01 ENCOUNTER — Inpatient Hospital Stay (HOSPITAL_COMMUNITY)

## 2023-09-01 DIAGNOSIS — R296 Repeated falls: Secondary | ICD-10-CM

## 2023-09-01 DIAGNOSIS — E7800 Pure hypercholesterolemia, unspecified: Secondary | ICD-10-CM

## 2023-09-01 DIAGNOSIS — I2510 Atherosclerotic heart disease of native coronary artery without angina pectoris: Secondary | ICD-10-CM

## 2023-09-01 DIAGNOSIS — M545 Low back pain, unspecified: Secondary | ICD-10-CM | POA: Diagnosis not present

## 2023-09-01 DIAGNOSIS — R42 Dizziness and giddiness: Secondary | ICD-10-CM

## 2023-09-01 DIAGNOSIS — I4891 Unspecified atrial fibrillation: Secondary | ICD-10-CM | POA: Diagnosis not present

## 2023-09-01 DIAGNOSIS — I2583 Coronary atherosclerosis due to lipid rich plaque: Secondary | ICD-10-CM

## 2023-09-01 LAB — CBC
HCT: 34 % — ABNORMAL LOW (ref 39.0–52.0)
Hemoglobin: 11.2 g/dL — ABNORMAL LOW (ref 13.0–17.0)
MCH: 29.4 pg (ref 26.0–34.0)
MCHC: 32.9 g/dL (ref 30.0–36.0)
MCV: 89.2 fL (ref 80.0–100.0)
Platelets: 260 K/uL (ref 150–400)
RBC: 3.81 MIL/uL — ABNORMAL LOW (ref 4.22–5.81)
RDW: 13.1 % (ref 11.5–15.5)
WBC: 8.6 K/uL (ref 4.0–10.5)
nRBC: 0 % (ref 0.0–0.2)

## 2023-09-01 LAB — BASIC METABOLIC PANEL WITH GFR
Anion gap: 9 (ref 5–15)
BUN: 19 mg/dL (ref 8–23)
CO2: 23 mmol/L (ref 22–32)
Calcium: 8.2 mg/dL — ABNORMAL LOW (ref 8.9–10.3)
Chloride: 103 mmol/L (ref 98–111)
Creatinine, Ser: 1.03 mg/dL (ref 0.61–1.24)
GFR, Estimated: >60 mL/min (ref >60)
Glucose, Bld: 97 mg/dL (ref 70–99)
Potassium: 4.1 mmol/L (ref 3.5–5.1)
Sodium: 135 mmol/L (ref 135–145)

## 2023-09-01 LAB — GLUCOSE, CAPILLARY: Glucose-Capillary: 164 mg/dL — ABNORMAL HIGH (ref 70–99)

## 2023-09-01 LAB — TSH: TSH: 1.515 u[IU]/mL (ref 0.350–4.500)

## 2023-09-01 LAB — MAGNESIUM: Magnesium: 1.9 mg/dL (ref 1.7–2.4)

## 2023-09-01 LAB — VITAMIN B12: Vitamin B-12: 221 pg/mL (ref 180–914)

## 2023-09-01 LAB — HEPARIN LEVEL (UNFRACTIONATED): Heparin Unfractionated: 0.37 [IU]/mL (ref 0.30–0.70)

## 2023-09-01 MED ORDER — LEVALBUTEROL HCL 0.63 MG/3ML IN NEBU
0.6300 mg | INHALATION_SOLUTION | Freq: Three times a day (TID) | RESPIRATORY_TRACT | Status: DC | PRN
  Administered 2023-09-02 – 2023-09-03 (×2): 0.63 mg via RESPIRATORY_TRACT
  Filled 2023-09-01 (×2): qty 3

## 2023-09-01 MED ORDER — ACETAMINOPHEN 325 MG PO TABS
650.0000 mg | ORAL_TABLET | Freq: Four times a day (QID) | ORAL | Status: DC | PRN
  Administered 2023-09-01 (×2): 650 mg via ORAL
  Filled 2023-09-01: qty 2

## 2023-09-01 MED ORDER — LATANOPROST 0.005 % OP SOLN
1.0000 [drp] | Freq: Every day | OPHTHALMIC | Status: DC
  Administered 2023-09-01 – 2023-09-09 (×10): 1 [drp] via OPHTHALMIC
  Filled 2023-09-01 (×4): qty 2.5

## 2023-09-01 MED ORDER — METOPROLOL SUCCINATE ER 25 MG PO TB24
12.5000 mg | ORAL_TABLET | Freq: Every day | ORAL | Status: DC
  Administered 2023-09-01 (×2): 12.5 mg via ORAL
  Filled 2023-09-01: qty 1

## 2023-09-01 MED ORDER — KETOROLAC TROMETHAMINE 30 MG/ML IJ SOLN
30.0000 mg | Freq: Four times a day (QID) | INTRAMUSCULAR | Status: DC | PRN
  Administered 2023-09-01 (×4): 30 mg via INTRAVENOUS
  Filled 2023-09-01 (×2): qty 1

## 2023-09-01 MED ORDER — LEVALBUTEROL HCL 0.63 MG/3ML IN NEBU
INHALATION_SOLUTION | RESPIRATORY_TRACT | Status: AC
  Administered 2023-09-01 (×2): 0.63 mg
  Filled 2023-09-01: qty 3

## 2023-09-01 MED ORDER — HYDROMORPHONE HCL 1 MG/ML IJ SOLN
0.5000 mg | INTRAMUSCULAR | Status: DC | PRN
  Administered 2023-09-01 – 2023-09-03 (×12): 1 mg via INTRAVENOUS
  Filled 2023-09-01 (×9): qty 1

## 2023-09-01 MED ORDER — ATORVASTATIN CALCIUM 80 MG PO TABS
80.0000 mg | ORAL_TABLET | Freq: Every day | ORAL | Status: DC
  Administered 2023-09-01 – 2023-09-10 (×11): 80 mg via ORAL
  Filled 2023-09-01 (×3): qty 1
  Filled 2023-09-01: qty 2
  Filled 2023-09-01: qty 1
  Filled 2023-09-01: qty 2
  Filled 2023-09-01 (×2): qty 1
  Filled 2023-09-01: qty 2
  Filled 2023-09-01 (×2): qty 1

## 2023-09-01 MED ORDER — SODIUM CHLORIDE 0.9 % IV BOLUS
250.0000 mL | Freq: Once | INTRAVENOUS | Status: AC
  Administered 2023-09-01 (×2): 250 mL via INTRAVENOUS

## 2023-09-01 MED ORDER — SODIUM CHLORIDE 0.9% FLUSH
3.0000 mL | Freq: Two times a day (BID) | INTRAVENOUS | Status: DC
  Administered 2023-09-01 – 2023-09-10 (×19): 3 mL via INTRAVENOUS

## 2023-09-01 MED ORDER — ACETAMINOPHEN 500 MG PO TABS
1000.0000 mg | ORAL_TABLET | Freq: Three times a day (TID) | ORAL | Status: DC
  Administered 2023-09-01 – 2023-09-10 (×30): 1000 mg via ORAL
  Filled 2023-09-01 (×28): qty 2

## 2023-09-01 MED ORDER — ACETAMINOPHEN 650 MG RE SUPP: 650.0000 mg | Freq: Four times a day (QID) | RECTAL | Status: DC | PRN

## 2023-09-01 MED ORDER — HEPARIN BOLUS VIA INFUSION
4000.0000 [IU] | Freq: Once | INTRAVENOUS | Status: AC
  Administered 2023-09-01 (×2): 4000 [IU] via INTRAVENOUS
  Filled 2023-09-01: qty 4000

## 2023-09-01 MED ORDER — OXYCODONE HCL 5 MG PO TABS
5.0000 mg | ORAL_TABLET | ORAL | Status: DC | PRN
  Administered 2023-09-01 – 2023-09-03 (×11): 5 mg via ORAL
  Filled 2023-09-01 (×7): qty 1

## 2023-09-01 MED ORDER — BISACODYL 5 MG PO TBEC: 5.0000 mg | DELAYED_RELEASE_TABLET | Freq: Every day | ORAL | Status: DC | PRN

## 2023-09-01 MED ORDER — POLYETHYLENE GLYCOL 3350 17 G PO PACK: 17.0000 g | PACK | Freq: Every day | ORAL | Status: DC | PRN

## 2023-09-01 MED ORDER — PROCHLORPERAZINE EDISYLATE 10 MG/2ML IJ SOLN: 5.0000 mg | Freq: Four times a day (QID) | INTRAMUSCULAR | Status: DC | PRN

## 2023-09-01 MED ORDER — ASPIRIN 81 MG PO TBEC
81.0000 mg | DELAYED_RELEASE_TABLET | Freq: Every day | ORAL | Status: DC
Start: 2023-09-01 — End: 2023-09-10
  Administered 2023-09-01 – 2023-09-10 (×11): 81 mg via ORAL
  Filled 2023-09-01 (×11): qty 1

## 2023-09-01 MED ORDER — HEPARIN (PORCINE) 25000 UT/250ML-% IV SOLN
1650.0000 [IU]/h | INTRAVENOUS | Status: DC
  Administered 2023-09-01 – 2023-09-02 (×5): 1400 [IU]/h via INTRAVENOUS
  Administered 2023-09-03: 1650 [IU]/h via INTRAVENOUS
  Filled 2023-09-01 (×3): qty 250

## 2023-09-01 MED ORDER — TAMSULOSIN HCL 0.4 MG PO CAPS
0.4000 mg | ORAL_CAPSULE | Freq: Every day | ORAL | Status: DC
  Administered 2023-09-01 – 2023-09-09 (×12): 0.4 mg via ORAL
  Filled 2023-09-01 (×10): qty 1

## 2023-09-01 MED ORDER — TIZANIDINE HCL 4 MG PO TABS
4.0000 mg | ORAL_TABLET | Freq: Three times a day (TID) | ORAL | Status: DC
  Administered 2023-09-01 (×6): 4 mg via ORAL
  Filled 2023-09-01 (×3): qty 1

## 2023-09-01 MED ORDER — LIDOCAINE 5 % EX PTCH
1.0000 | MEDICATED_PATCH | CUTANEOUS | Status: DC
  Administered 2023-09-01 – 2023-09-02 (×3): 1 via TRANSDERMAL
  Filled 2023-09-01 (×2): qty 1

## 2023-09-01 MED ORDER — METHOCARBAMOL 1000 MG/10ML IJ SOLN
500.0000 mg | Freq: Three times a day (TID) | INTRAMUSCULAR | Status: DC | PRN
  Administered 2023-09-02: 500 mg via INTRAVENOUS
  Filled 2023-09-01: qty 10

## 2023-09-01 NOTE — Discharge Instructions (Signed)
 [place] [facility] Address: [address] [place], [place] [address] Phone Number: [phone] Hours of Operation: Residents of [place] can come to obtain food Monday through Friday from 8:30am until 3:30pm. Photo ID and Social Security cards required for all residents of a household. Can come six times a year  [facility] Address: [address] [place], [place] [address] Phone Number: [phone] Hours of Operation: Operates Tuesday-Friday 10:00 a.m. to 1 p.m. Requirements: Referral from DSS needed. May come 6 times a year, 30 days apart. Photo ID and SS required for all residents of household.  [facility] Address: [address] [place], [place] [address] Phone Number: [phone] Hours of Operation: Food pantry is open on the last Saturday of each month from 10:00 am - 12:00 noon. No appointment needed. No qualifications.  [facility] Address: [address] [place], [place] [address] Phone Number: [phone] EXT. [phone] Hours of Operation: Must make reservations to pick up food on Saturdays. Sign ups for Saturday pick up beginning at 8:30 a.m. on Monday morning.  [facility] Address: [address] [place], [place] [address] Phone Number: [phone] Hours of Operation: If you need food, bring proper identification such as a driver's license to receive a bag of food once a month. Requirements: Can come once every 30 days with referral DSS, Holiday representative, Mental health etc. Each referral good for six visits. Photo ID required. *1st visit no referral required.  [facility] Address: [address], [place] [address] Phone Number: [phone]  [facility] Address: [address] [place], [place] [address] Phone Number: [phone] Hours of Operation:  You can register at [email or web address] for free groceries  FREE INDEED FOOD  PANTRY Address: [address], [place] [address] Phone Number: [phone] Hours of Operation: Drive through giveaway, first come first served. Every 3rd Saturday 11AM - 1PM  [facility] [address] Address: [address], [place] [address] Phone Number: [phone]   [place]  [facility] Address: [address] [place], [place] [address] Phone Number: [phone] Hours of Operation: Once a month every 3rd Saturday  [facility] Address: [address] [place], [place] [address] Phone Number: [phone] Hours of Operation: Distribution happens from 9:00-10:00 a.m. every Saturday.     [facility] Address: [address] [place], [place] [address] Phone Number: [phone] Hours of Operation: ONCE a week for the community food distribution held every Tuesday, Wednesday and Thursday from 11 a.m. - 2:00 p.m. Food is available on a first come, first serve basis and varies week to week. No appointment necessary for drive thru pick up.  [facility] Address: [address] [place], [place] [address] Phone Number: [phone] Hours of Operation: Open every 3rd Thursday 9:30 a.m. - 11:00 a.m.  [facility] Address: [address] [place], [place] [address] Phone Number: [phone] Hours of Operation: Please call for hours, directions, and questions  [facility] [place] [facility] Address: [address], [place], [place]  [address] Phone Number: [phone] Website: https://www.Hollyguns.co.za Food Finder app: https://findfood.ghpfa.org  [employer] Address: [address] [place], [place] [address] Phone Number: [phone] Hours of Operation: Contact Bree Harpe. Enrolled Substance Abuse Clients Only  [facility] Address: [address] [place], [address]  Phone Number: [phone] Hours of Operation: Contact Alene Ana. Food pantry open the 3rd Saturday of each month  from 9 a.m. -12 p.m. only  [place] [facility] Address: [address] [place], [place] [address] Phone Number: [phone] Hours of Operation: Contact Loletta Ripple. Emergency food bank open on Saturdays by appointment only  [facility] Address: [address] [place], [place] [address] Phone Number: [phone] Hours of Operation: No specific contact person; Anyone can help  [facility] Address: [address] [place], [place] [address] Phone Number: [phone] Hours of Operation: Contact Julia Oats. Agency gives out a bag of food every Thursday from 2-4 p.m. only, and also provides a community meal every Thursday between 5-6 p.m. Other services provided include rent/mortgage and utility assistance, women's winter shelter, thrift store, and senior adult activities.  [facility] [place] Address: [address] [place], [place] [address] Phone Number: [phone] Hours of Operation: The Emergency Food Assistance Program provides individuals and families with a generous supply of food including meat, fresh vegetables, and nonperishable items. The food box contains five days' worth of food, and each family or individual can receive a box once per month. M, W, Th, Fr 11am-2pm, walk-ins welcome.  [facility] Address: [address] [place], [place] [address]  Phone Number: [phone] Hours of Operation: Contact Asia Blanca Bunch. Tuesdays and Thursdays from 11am - 3pm by appointment only  Sunday, by APPOINTMENT ONLY  [address] of [place], [address] [place], [address], [phone], 3.2 mi from [facility], call in advance for appointment at 10:00am or at 4:00pm, must provide valid photo ID  Monday  9:30am-5:00pm [facility], [address] [place] [id number], [phone], 0.9 mi from [facility], can come four times per year, bring your photo ID and SS cards for other  residents of household, will make appointments for those who work and need to come after 5pm  10:00am-12:00noon [employer], [address]  [place], [address], [phone], 1.7 mi from [facility], can come once every 60 days per household, need referral from DSS, [employer], etc., bring photo ID and SS card   10:00am-1:00pm [employer] [facility],  [address] [place], [address], [phone], 7.7 mi from [facility], can come once every thirty days with a referral from DSS, [employer], Mental Health, etc. -- each referral good for six visits, bring photo ID   10:00am-1:00pm [address], [address], [address], [phone], 4.2 mi from [facility], can come once every 6 months, open to [facility] residents, bring photo ID and copy of a current utility bill in your name, please call first to verify that food is available  6:30pm-8:30pm [facility], [address], [address], [phone], 3.2 mi from [facility], can come once every 30 days, maximum 6 times per year, bring your photo ID and SS numbers for other residents of household  Monday by APPOINTMENT ONLY  [facility], [address] [place], [address],  [phone], 2.5 mi from [facility], call in advance for appointment between 10:00am-2:00pm, bring your photo ID and SS cards for all residents of household, can come once every 3 months  [facility], [address], [address], [phone], 0.7 mi from [facility], call in advance for appointment, can come once every 30 days, bring your photo ID and SS cards for other residents of  household   Tuesday  9:00am-12:00noon [employer], [address] [address], [phone], 1.3 mi from [facility], can come once every 3 months, bring your photo ID and SS numbers for other residents of household   9:00am-1:00pm [facility] [address]   [place], [address], [phone]/Ext [phone], 1.6 mi from [facility], can come once every two weeks  9:30am-5:00pm [employer], [address] [place] [phone], [phone], 0.9 mi from [facility], can come four times per year, bring your photo ID and SS cards for other residents of household, will make appointments for those who work and need to come after 5pm  10:00am-12:00noon [employer], [address]  [place], [address], [phone], 1.7 mi from [facility], can come once every 60 days per household, need referral from DSS, [employer], etc., bring photo ID and SS card  10:00am-1:00pm [facility], [id number],  [phone], 3.8 mi from [facility], with referral from DSS, may come six times, 30 days apart, bring your photo ID and SS cards for all residents of household   10:00am-1:00pm [address] the [facility], [address] [place], [address], [phone], 7.7 mi from [facility], can come once every thirty days with a referral from DSS, [employer], Mental Health, etc.- each referral good for six visits, bring photo ID   10:00am-1:00pm [address], [address], [address], [phone], 4.2 mi from [facility], can come once every 6 months, open to [facility] residents, bring photo ID and copy of a current utility bill in your name, please call first to verify that food is available  2:00pm-3:30pm [facility], [address], [phone], [phone], 3.7 mi from [facility], can come twelve times per year, one bag per family, bring photo ID   FIRST AND THIRD Tuesdays  10:00am-1:00pm, [address] [place], [address], [phone], 7.2 mi from [facility], can come once every 30 days   Tuesday, WHEN FOOD IS AVAILABLE (call)  12:00noon-2:00pm [facility], [address], [address], [phone], 1.5 mi from  [facility], can come once every 30 days, bring photo ID   Tuesday, by APPOINTMENT ONLY  [facility], [address] [place], [address],  [phone], 2.5 mi from [facility], call in advance for appointment between 1:00pm-4:00pm, bring your photo ID and SS cards for all residents of household, can come once every 3 months   [address] of [place], [address], [address], [phone], 5 mi from [facility], call one day ahead for appointment the next day between 10:00am and 12:00noon, can come once every 3 months, bring your photo ID and must qualify according to family income   [facility], [address], [address], [phone], 0.7 mi from [facility], call in advance for appointment, can come once every 30 days, bring your photo ID and SS cards for other residents of household   [address] of [place], Arsenio Larger [place], [address],  [phone], 5.1 mi from [facility], call between 9:00am and 1:00pm M-F to make appointment. Appointments are scheduled for Tues and Thurs from 10:00am-11:30am, bring photo ID, can come once every 6 months, limit three visits over  18 months, then must have referral  Wednesday  9:30am-5:00pm [facility], [address] [place] [phone], [phone], 0.9 mi from [facility], can come four times per year, bring your photo ID and SS cards for other residents of household, will make appointments for those who work and need to come after 5pm  9:30am-11:30am [employer] [facility], [address], [address], [phone], 6.6 mi from [facility], can come once every 30 days, bring your photo ID, and SS cards for other residents of household, each monthly visit requires a written referral from GUM or DSS with number in household on form  10:00am-12:00noon [address], [address] [place], [address], [phone], 1.7 mi from [facility], can come once every 60 days per household, need referral from DSS, [employer], etc., bring photo ID and SS card  10:00am-1:00pm [facility], [id number],  [phone], 3.9 mi from [facility], with referral from DSS, may come six times, 30 days apart, bring your photo ID and SS cards for all residents of household   10:00am-1:00pm [address] the [facility], [address] [place], [address], [phone], 7.7 mi from [facility], can come once every thirty days with a referral from DSS, [employer], Mental Health, etc. - each referral good for six visits, bring photo ID   2:00pm-5:45pm [address] [place], [address], [phone], 3.2 mi from [facility], once every 30 days, first come/first served, limited to first 25, bring photo ID   6:30pm-8:30pm [facility], [address], [address], [phone], 3.2 mi from [facility], can come once every 30 days, maximum 6 times per year, bring your photo ID and SS numbers for other residents of household  FIRST and THIRD Wednesdays   9:00am-12:00noon, [facility], [address] [place], [address], [phone], 4.2 mi from [facility], can come once a month. Please arrive and sign in no later than 11:15 so everyone can be served by 12 noon.  THIRD Wednesday  1:30pm-3:00pm, [place]. [address], [address] [place], [address], [phone] or [phone], 2.1 mi from [facility]  Wednesday, by APPOINTMENT ONLY  [address] [facility] of [place], [address] [address], [phone], 5 mi from [facility], call one day ahead for appointment the next day between 10:00am and 12:00noon, can come once every 3 months, bring your photo ID and must qualify according to family income   [facility], [address] [address], [phone], 0.7 mi from [facility], call in advance for  appointment, can come once every 30 days, bring your photo ID and SS cards for other residents of household   [address] of [place], [address] [place], [address], [phone], 3.2 mi from [facility], call in advance for appointment at 7:00pm, must provide valid photo ID  Thursday  9:00am-12:00noon [employer], [address], [address], [phone], 1.3 mi from [facility], can come once every 3 months, bring your photo ID and SS numbers for other residents of household   9:00am-1:00pm [facility] [address],  [place], [address], [phone]/Ext [phone], 1.6 mi from [facility], can come once every two weeks  9:30am-12:00noon [facility], [address] [place], [phone], 4.5 mi from [facility], can come once every 30 days, must state income [closed Thanksgiving and week of Christmas]  9:30am-5:00pm [employer], [address] [place] [phone], [phone], 0.9 mi from [facility], can come four times per year, bring your photo ID and SS card for other residents of household, will make appointments for those who work and need to come after 5pm  10:00am-1:00pm [facility], [address] [place], [id number],  [phone], 3.9 mi from [facility], with referral from DSS, may come six times, 30 days apart, bring your photo ID and SS cards for all residents of household   10:00am-1:00pm [address] the [facility], [address] [place], [address], [phone], 7.7 mi from [facility], can come once every thirty days with a referral from DSS, [employer], Mental Health, etc.- each referral good for six visits, bring photo ID   10:00am-1:00pm [facility], [address] [address], [phone], 4.2 mi from [facility], can come once every 6 months, open to [facility] residents, bring photo ID and copy of a current utility bill in your name, please call first to  verify that food is available   SUNDAYS BREAKFAST TWO LOCATIONS: 8:00am served in [facility] by Awaken PPL Corporation 8:30am SHUTTLE provided from [facility], served at [employer], [address] LUNCH TWO LOCATIONS [plus one additional third Sunday only] 10:30am - 12:30pm served at [employer], [employer], [place] [address] (1.2 miles from [facility]) 12:30pm served in [place] by Land O'Lakes Team (THIRD Sunday only) 1:30pm served at [facility] by [facility] one location [plus one additional third Sunday only] 5:00pm Every Sunday, served under the bridge at [address] by Lige Reeve Under the 3M Company (.7 miles from [facility]) (THIRD Sunday ONLY) 4:00pm served in the parking garage, across from Nuc[employer] Corporation, corner of Kimberly and [place] by Ryland Group Works Ministries MONDAYS BREAKFAST 7:30am served in [employer] by the United States [employer] Corporation and Friends LUNCH 10:30am - 12:30pm served at [employer], [employer], [place] [address] (1.2 miles from [facility]) DINNER TWO LOCATIONS: 7:00pm served in front of the courthouse at the corner of Goldman Sachs and Qwest Communications. by [place] Monday Night Meal (3 blocks from [facility]) 4:30pm served at the [employer], [address] by Bank of New York Company (0.6 miles from [facility]) TUESDAYS BREAKFAST 8:00am - 9:00am served at The TJX Companies, [address] (0.3 miles from [place]) LUNCH 10:30am - 12:30pm served at the [employer], [employer] [address], (1.2 miles from [place]) DINNER 6:00pm served at CSX Corporation, enter from Capital One and go to the Sonic Automotive, (0.7 miles from [place]) [facility] BREAKFAST 7:00am - 8:00am served at [employer], [employer] [address], (1.2 miles from [place]) LUNCH ONE LOCATION [plus two additional locations listed below] 10:30am - 12:30pm served at [employer], [employer] [address] (1.2 miles from [place]) (FIRST Wednesday ONLY) 11:30am served at Dillard's, [place] [address] (6.6 miles from [place]) (SECOND Wednesday ONLY) 11:00am served at [place]. [place] of [address], [address] (1.3 miles from [place]) [place] TWO  LOCATIONS 6:00pm served at W. R. Berkley, [place] W. Visteon Corporation. (1.3 miles from [facility]) 4:00pm - 6:00pm (hot dogs and chips) served at Levi Strauss of [place] [facility], [address] (1.7 miles from [place]) [place] BREAKFAST NOT AVAILABLE AT THIS TIME LUNCH 10:30am - 12:30pm served at [employer], [employer], [place] [address] (1.2 miles from [place]) DINNER 6:00pm served at CSX Corporation, enter from Capital One and go to the Sonic Automotive, (0.7 miles from Nucor Corporation) [place] BREAKFAST NOT AVAILABLE AT THIS TIME LUNCH 10:30am - 12:30pm served at [employer], [employer] [address] (1.2 miles from [place]) DINNER TWO LOCATIONS, [plus one additional first Friday only] 6:00pm served under the bridge at [address] by Lige Reeve Under CS[employer] Corporation. (.7 miles from [facility]) 5:00pm - 7:00pm served at Levi Strauss of [facility], [address] (1.7 miles from [place]) (FIRST Friday ONLY) 5:45 pm - SHUTTLE provided from the LIBRARY at 5:45pm. Served at [facility], [address] [place]. SATURDAYS BREAKFAST TWO LOCATIONS [plus one additional last Saturday only] 8:00am served at [facility] by Delphi 8:30am served at [employer], [address] (2.2 miles from [facility]) (LAST  Saturday ONLY) 8:30am served at Beazer Homes, [address] (5 miles from [place]) LUNCH 10:30am - 12:30pm served at [employer]col[employer], [employer] [address] (1.2 miles from [facility]) DINNER 6:00pm served under the bridge at [address] by World Fuel Services Corporation (0.7 miles from Nucor Corporation)  DIRECTIONS FROM [place] TO ALL MEAL LOCATIONS The Bridge at [address] (.7 miles from [address]) [address] on [place]. Turn Right onto DIRECTV 433 ft. Continue onto [address] under bridge, about 500 ft. Courthouse (3 blocks from [facility]) [place] on [address]. Turn right on [place]  1 block to PPL Corporation (.5 miles from [place]) [place] on [place]. YRC Worldwide. past Brink's Company to EMCOR. Enter from Capital One and go to the Affiliated Computer Services building W. R. Berkley [address] Visteon [address]. (1.3 miles from [facility]) [address] on [place]. Turn Right onto W. Melanie Spires. church will be on the Left. The TJX Companies [address] (.3 miles from [facility]) Go .3 miles on W. Friend[address] Destination is on your right Dillard's at ONEOK (6.6 miles from [address]) [address] on [place] toward W Friend[address] Turn right onto W Friend[address] Continue onto Alcoa Inc. Continue onto Toll Brothers. 5. Latina Pol is on right Sanmina-SCI Futures trader) [address] (.6 miles from [place]) [place] on [place]. [address] Turn Left onto [address] 0.3 miles Destination is on the Left. [place] Black & Decker at American Express (5 miles from Nucor Corporation) 1. Head south on [address]. Turn right onto [address] Friend[address] Turn slightly left onto Quest Diagnostics Continue onto Quest Diagnostics Turn right at Barnes & Noble Continue to church on right [place] Sounds of [facility] [address] (1.7 miles from [place]) [address] on [place] 1.4 miles College becomes [place]. Elm  Jeryl Moris. Continue 0.6 miles and church will be on theright. [place] Guardian Life Insurance at [address] (2.5 miles from Nucor Corporation) [place] provided from Massachusetts Mutual Life [place]] [place] on [place]. Elm toward Estée Lauder right onto Costco Wholesale left onto Emerson Electric Turn left onto Micron Technology [address] [place]  [place] (2.2 miles from [address]) [address] on [place] 1.4 miles [place] becomes [place]. [place] [address] Turn right onto [address] [place]  [address] and church will be on the Left. Potter's House/Dodge AT&T [address] (1.2 miles from [facility]) 1.Turn right onto [facility] 2.Turn left onto [place] 3.[place] 4.Destination is on your right [place]. Lindon Rhine of [address] at ToysRus (1.3 miles from [facility]) [address] on [address] Turn left onto Genuine Parts right onto S. [place]. Continue onto KB Home	[place]. Turn left onto Smurfit-Stone Container. Turn right onto WellPoint.

## 2023-09-01 NOTE — Hospital Course (Addendum)
 Jacob [name] is a 73 y.o. male with a history of CAD s/p CABG, hyperlipidemia, hypertension, Crohn's disease, chronic low back pain.  Patient presented secondary to worsening back pain and right-sided chest pain. During hospitalization, patient developed atrial fibrillation with RVR, which was complicated by hypotension on diltiazem  drip. Patient's pain has been difficult to control. Worsening respiratory status. Pleural fluid concerning for possible empyema.

## 2023-09-01 NOTE — Progress Notes (Addendum)
 PHARMACY - ANTICOAGULATION CONSULT NOTE  Pharmacy Consult for heparin   Indication: atrial fibrillation  No Known Allergies  Patient Measurements: Height: 5' 11 (180.3 cm) Weight: 91.6 kg (202 lb) IBW/kg (Calculated) : 75.3 HEPARIN  DW (KG): 91.6  Vital Signs: Temp: 99.2 F (37.3 C) (08/12 1749) Temp Source: Axillary (08/12 1749) BP: 124/85 (08/12 2345) Pulse Rate: 77 (08/12 2345)  Labs: Recent Labs    08/31/23 1856 08/31/23 2012  HGB 11.3*  --   HCT 34.3*  --   PLT 267  --   CREATININE 1.15  --   TROPONINIHS 5 4    Estimated Creatinine Clearance: 68.2 mL/min (by C-G formula based on SCr of 1.15 mg/dL).   Medical History: Past Medical History:  Diagnosis Date   CAD (coronary artery disease), native coronary artery     multivessel ASCAD s/p CABG and s/p PCI of SVG to RI 2008   Carotid artery stenosis    patent right CEA site and 1-39% Lcarotid stenosis   Crohn's disease (HCC)    flare 2000   Depression    Dyslipidemia    Dyspnea    ED (erectile dysfunction)    History of blood transfusion 2005   Hypertension    Myocardial infarction (HCC) 2005   5 bypasses   Nephrolithiasis    Stroke (HCC)    TIA- Endartarectemy R side   TIA (transient ischemic attack)    Ulcerative colitis (HCC)    Left sided   Assessment: 57 yoM presented to ED with abdominal/back pain and found to be in afib RVR. Pharmacy consulted to dose heparin  for afib.  -No hx of afib or DOAC use -Hgb 11s, plts WNL  Goal of Therapy:  Heparin  level 0.3-0.7 units/ml Monitor platelets by anticoagulation protocol: Yes   Plan:  Give 4000 units bolus x 1 Start heparin  infusion at 1400 units/hr Check anti-Xa level in 8 hours and daily while on heparin  Continue to monitor H&H and platelets Follow up transition to DOAC after MRIs completed  Lynwood Poplar, PharmD, BCPS Clinical Pharmacist 09/01/2023 12:03 AM

## 2023-09-01 NOTE — Consult Note (Signed)
 Neurosurgery Consult Note  Assessment:  73 year old male who was admitted for new atrial fibrillation and was placed on therapeutic heparin .  Neurosurgery consulted for acute on chronic back pain.  MRI shows mild generalized spondylosis, L5-S1 facet arthropathy, and L3-4 degenerative disease  Plan:  Recommend outpatient follow up with Pain Management for consideration of L3-4 ESI and/or L5-S1 MBB   CC: back pain  HPI:     Patient is a 73 y.o. male w/ hx Crohn's disease, CAD, prior stroke, CABG who was admitted to the hospital for 2 reasons.  First, he has new onset atrial fibrillation and is being treated with therapeutic anticoagulation.  Second, he has acute on chronic low back pain.  He has experienced low back pain for several months and has had multiple flareups.  The pain is located in his bilateral midline and paraspinal lumbar region.  He does have associated pain that radiates from the back into the right thigh.  Pain does not radiate beyond the knee.  He denies focal motor or sensory deficit other than baseline peripheral neuropathy, left leg pain.  MRI lumbar spine shows generalized spondylosis and degenerative disc disease worst at L3-4.  There is facet arthropathy at L5-S1   Patient Active Problem List   Diagnosis Date Noted   Rapid atrial fibrillation (HCC) 08/31/2023   Acute on chronic back pain 08/31/2023   Abdominal pain 08/31/2023   Acute focal neurological deficit 01/14/2022   Crohn's colitis (HCC) 03/10/2018   Scrotal pain 12/07/2017   Carotid artery stenosis    Confusion    TIA (transient ischemic attack) 06/16/2016   Coronary atherosclerosis of native coronary artery 09/14/2013   Pure hypercholesterolemia 09/14/2013   Essential hypertension, benign 09/14/2013   Bradycardia, sinus 09/14/2013   Past Medical History:  Diagnosis Date   CAD (coronary artery disease), native coronary artery     multivessel ASCAD s/p CABG and s/p PCI of SVG to [place] 2008   Carotid  artery stenosis    patent right CEA site and 1-39% Lcarotid stenosis   Crohn's disease (HCC)    flare 2000   Depression    Dyslipidemia    Dyspnea    ED (erectile dysfunction)    History of blood transfusion 2005   Hypertension    Myocardial infarction [facility]) 2005   5 bypasses   Nephrolithiasis    Stroke (HCC)    TIA- Endartarectemy R side   TIA (transient ischemic attack)    Ulcerative colitis (HCC)    Left sided    Past Surgical History:  Procedure Laterality Date   CARDIAC CATHETERIZATION  06/08   Patnet grafts LAD, marginal, distal right   carpel tunnel surgery Left    COLONOSCOPY     COLONOSCOPY WITH PROPOFOL  N/A 10/25/2012   Procedure: COLONOSCOPY WITH PROPOFOL ;  Surgeon: Gladis MARLA Louder, MD;  Location: [facility];  Service: Endoscopy;  Laterality: N/A;   CORONARY ARTERY BYPASS GRAFT  2005   ASCVD,multivessel,S/P CABG  x5 total per patient   ENDARTERECTOMY Right 06/24/2016   Procedure: ENDARTERECTOMY CAROTID;  Surgeon: Marea Selinda RAMAN, MD;  Location: [facility] ORS;  Service: Vascular;  Laterality: Right;   HERNIA REPAIR  many years ago   KNEE ARTHROSCOPY Right 1995    Medications Prior to Admission  Medication Sig Dispense Refill Last Dose/Taking   amLODipine  (NORVASC ) 5 MG tablet Take 1 tablet (5 mg total) by mouth daily. 90 tablet 0 08/31/2023 Morning   aspirin  EC 81 MG tablet Take 81 mg by mouth daily.  Swallow whole.   08/31/2023 Morning   atorvastatin  (LIPITOR) 80 MG tablet Take 1 tablet (80 mg total) by mouth daily. 90 tablet 3 08/31/2023 Morning   glucosamine-chondroitin 500-400 MG tablet Take 1 tablet by mouth 2 (two) times daily.   08/31/2023 Morning   HYDROcodone -acetaminophen  (NORCO/VICODIN) 5-325 MG tablet Take 1 tablet by mouth every 8 (eight) hours as needed for severe pain (pain score 7-10). 20 tablet 0 08/31/2023 Noon   latanoprost  (XALATAN ) 0.005 % ophthalmic solution Place 1 drop into both eyes at bedtime.   08/30/2023 Bedtime   lidocaine  (SALONPAS PAIN RELIEVING) 4  % Place 1 patch onto the skin daily.   08/30/2023 Morning   lisinopril  (ZESTRIL ) 20 MG tablet Take 1 tablet (20 mg total) by mouth daily. 90 tablet 3 08/31/2023 Morning   metoprolol  succinate (TOPROL -XL) 25 MG 24 hr tablet Take 0.5 tablets (12.5 mg total) by mouth daily. 45 tablet 3 08/31/2023 at  6:30 AM   nitroGLYCERIN  (NITROSTAT ) 0.4 MG SL tablet Place 1 tablet (0.4 mg total) under the tongue every 5 (five) minutes as needed for chest pain. 25 tablet 3 Unknown   predniSONE (STERAPRED UNI-PAK 21 TAB) 10 MG (21) TBPK tablet Take 10 mg by mouth See admin instructions.   08/31/2023 Morning   sildenafil (VIAGRA) 100 MG tablet Take 100 mg by mouth daily as needed for erectile dysfunction.   Unknown   tamsulosin  (FLOMAX ) 0.4 MG CAPS capsule Take 0.4 mg by mouth at bedtime.   08/30/2023 Bedtime   tiZANidine  (ZANAFLEX ) 4 MG tablet Take 1 tablet (4 mg total) by mouth 3 (three) times daily. 30 tablet 0 08/31/2023 Morning   vitamin B-12 (CYANOCOBALAMIN ) 500 MCG tablet Take 1,000 mcg by mouth daily.   08/31/2023 Morning   No Known Allergies  Social History   Tobacco Use   Smoking status: Never   Smokeless tobacco: Current    Types: Snuff  Substance Use Topics   Alcohol use: No    Family History  Problem Relation Age of Onset   Ovarian cancer Mother    CAD Father    CAD Sister    CAD Brother    CAD Brother    CAD Brother    Colon cancer Neg Hx    Esophageal cancer Neg Hx    Stomach cancer Neg Hx    Rectal cancer Neg Hx    Colon polyps Neg Hx      Review of Systems Pertinent items are noted in HPI.  Objective:   Patient Vitals for the past 8 hrs:  BP Temp Temp src Pulse Resp SpO2 Height Weight  09/01/23 1118 105/85 98.6 F (37 C) Oral 88 -- 93 % 5' 11 (1.803 m) 95.4 kg  09/01/23 1038 109/74 98.3 F (36.8 C) Oral 98 18 95 % -- --  09/01/23 0915 106/69 -- -- 82 18 94 % -- --  09/01/23 0900 123/81 -- -- (!) 109 (!) 21 94 % -- --  09/01/23 0800 128/82 -- -- 94 (!) 23 95 % -- --  09/01/23  0750 -- -- -- 99 -- 96 % -- --  09/01/23 0745 (!) 130/102 -- -- -- (!) 21 96 % -- --  09/01/23 0700 132/75 (!) 100.4 F (38 C) -- (!) 130 19 96 % -- --  09/01/23 0645 125/80 -- -- 90 (!) 24 96 % -- --  09/01/23 0630 138/69 -- -- (!) 128 (!) 45 96 % -- --  09/01/23 0615 128/82 -- -- 91 (!) 22 96 % -- --  09/01/23 0600 131/74 -- -- 69 20 96 % -- --  09/01/23 0530 122/78 -- -- 81 18 96 % -- --   I/O last 3 completed shifts: In: 1037.9 [I.V.:37.9; IV Piggyback:1000] Out: -  Total I/O In: 10 [I.V.:10] Out: -     Exam: RUE: 5/5 deltoid, 5/5 bicep, 5/5 wrist extension, 5/5 tricep, 5/5 grip LUE: 5/5 deltoid, 5/5 bicep, 5/5 wrist extension, 5/5 tricep, 5/5 grip RLE: 5/5 IP, 5/5 quad, 5/5 ham, 5/5 DF, 5/5 EHL, 5/5 PF LLE: 5/5 IP, 5/5 quad, 5/5 ham, 5/5 DF, 5/5 EHL, 5/5 PF Subjective decrease sensation to light touch bilateral stocking distribution No Hoffman or Babinski    Data ReviewCBC:  Lab Results  Component Value Date   WBC 8.6 09/01/2023   RBC 3.81 (L) 09/01/2023   BMP:  Lab Results  Component Value Date   GLUCOSE 97 09/01/2023   CO2 23 09/01/2023   BUN 19 09/01/2023   BUN 10 06/07/2019   CREATININE 1.03 09/01/2023   CREATININE 1.15 11/07/2015   CALCIUM  8.2 (L) 09/01/2023

## 2023-09-01 NOTE — Progress Notes (Signed)
 PHARMACY - ANTICOAGULATION CONSULT NOTE  Pharmacy Consult for heparin   Indication: atrial fibrillation  No Known Allergies  Patient Measurements: Height: 5' 11 (180.3 cm) Weight: 95.4 kg (210 lb 5.1 oz) IBW/kg (Calculated) : 75.3 HEPARIN  DW (KG): 94.5  Vital Signs: Temp: 98.6 F (37 C) (08/13 1118) Temp Source: Oral (08/13 1118) BP: 105/85 (08/13 1118) Pulse Rate: 88 (08/13 1118)  Labs: Recent Labs    08/31/23 1856 08/31/23 2012 09/01/23 0450 09/01/23 1337  HGB 11.3*  --  11.2*  --   HCT 34.3*  --  34.0*  --   PLT 267  --  260  --   HEPARINUNFRC  --   --   --  0.37  CREATININE 1.15  --  1.03  --   TROPONINIHS 5 4  --   --     Estimated Creatinine Clearance: 77.5 mL/min (by C-G formula based on SCr of 1.03 mg/dL).   Medical History: Past Medical History:  Diagnosis Date   CAD (coronary artery disease), native coronary artery     multivessel ASCAD s/p CABG and s/p PCI of SVG to RI 2008   Carotid artery stenosis    patent right CEA site and 1-39% Lcarotid stenosis   Crohn's disease (HCC)    flare 2000   Depression    Dyslipidemia    Dyspnea    ED (erectile dysfunction)    History of blood transfusion 2005   Hypertension    Myocardial infarction (HCC) 2005   5 bypasses   Nephrolithiasis    Stroke (HCC)    TIA- Endartarectemy R side   TIA (transient ischemic attack)    Ulcerative colitis (HCC)    Left sided   Assessment: 61 yoM presented to ED with abdominal/back pain and found to be in afib RVR. Pharmacy consulted to dose heparin  for afib.  -No hx of afib or DOAC use -Hgb 11s, plts WNL Heparin  drip 1400 uts/he with heparin  level 0.37 at goal   Goal of Therapy:  Heparin  level 0.3-0.7 units/ml Monitor platelets by anticoagulation protocol: Yes   Plan:  Heparin  drip 1400 uts/hr Daily CBC and heparin  level Follow up transition to DOAC after MRIs completed   Olam Chalk Pharm.D. CPP, BCPS Clinical Pharmacist [phone] 09/01/2023 3:03  PM

## 2023-09-01 NOTE — H&P (Signed)
 History and Physical    Jacob [name] FMW:[id number] DOB: 1951/01/03 DOA: 08/31/2023  PCP: [name], MD   Patient coming from: Home   Chief Complaint: Increased low back pain, right flank pain, dizzy   HPI: Jacob [name] is a 73 y.o. male with medical history significant for hypertension, hyperlipidemia, CAD status post CABG, Crohn's disease, and chronic low back pain who presents with multiple complaints including acute worsening in his chronic back pain, new right flank/RUQ pain, and dizziness.  Patient reports that he woke this morning with severe pain in his low back.  Pain is the same in location and character as his chronic back pain but more severe.  He is unable to identify any precipitating factors for the acute worsening.  He also notes a new pain in his right flank area, again without any precipitating factor identified.  There is no fever, chill, nausea, or vomiting associated with this and he denies any saddle anesthesia or change in bowel or bladder habits.  He was having difficulty ambulating due to severe pain and EMS was called.  They found him to be in rapid atrial fibrillation and administered 800 mL of IV fluids and 200 mcg of fentanyl  prior to arrival in the ED.  ED Course: Upon arrival to the ED, patient is found to be afebrile and saturating well on room air initially with elevated heart rate and stable blood pressure.  Labs are most notable for normal creatinine, albumin 2.7, normal WBC, normal troponin x 2, BNP 154, and D-dimer 1.50.  CTA chest is negative for PE, there is no acute finding on CT of the abdomen and pelvis, and no acute intracranial abnormality on head CT. There is cholelithiasis on RUQ ultrasound but no evidence for acute cholecystitis.  Patient was given a liter of saline, Dilaudid , morphine , 10 mg IV diltiazem , and was started on diltiazem  infusion.  MRI brain and MRI lumbar spine were ordered from the ED but not yet performed.  Review of  Systems:  All other systems reviewed and apart from HPI, are negative.  Past Medical History:  Diagnosis Date   CAD (coronary artery disease), native coronary artery     multivessel ASCAD s/p CABG and s/p PCI of SVG to RI 2008   Carotid artery stenosis    patent right CEA site and 1-39% Lcarotid stenosis   Crohn's disease (HCC)    flare 2000   Depression    Dyslipidemia    Dyspnea    ED (erectile dysfunction)    History of blood transfusion 2005   Hypertension    Myocardial infarction [facility]) 2005   5 bypasses   Nephrolithiasis    Stroke [facility])    TIA- Endartarectemy R side   TIA (transient ischemic attack)    Ulcerative colitis (HCC)    Left sided    Past Surgical History:  Procedure Laterality Date   CARDIAC CATHETERIZATION  06/08   Patnet grafts LAD, marginal, distal right   carpel tunnel surgery Left    COLONOSCOPY     COLONOSCOPY WITH PROPOFOL  N/A 10/25/2012   Procedure: COLONOSCOPY WITH PROPOFOL ;  Surgeon: Gladis MARLA Louder, MD;  Location: [facility]COPY;  Service: Endoscopy;  Laterality: N/A;   CORONARY ARTERY BYPASS GRAFT  2005   ASCVD,multivessel,S/P CABG  x5 total per patient   ENDARTERECTOMY Right 06/24/2016   Procedure: ENDARTERECTOMY CAROTID;  Surgeon: Marea Selinda RAMAN, MD;  Location: [facility] ORS;  Service: Vascular;  Laterality: Right;   HERNIA REPAIR  many  years ago   KNEE ARTHROSCOPY Right 1995    Social History:   reports that he has never smoked. His smokeless tobacco use includes snuff. He reports that he does not drink alcohol and does not use drugs.  No Known Allergies  Family History  Problem Relation Age of Onset   Ovarian cancer Mother    CAD Father    CAD Sister    CAD Brother    CAD Brother    CAD Brother    Colon cancer Neg Hx    Esophageal cancer Neg Hx    Stomach cancer Neg Hx    Rectal cancer Neg Hx    Colon polyps Neg Hx      Prior to Admission medications   Medication Sig Start Date End Date Taking? Authorizing Provider  amLODipine   (NORVASC ) 5 MG tablet Take 1 tablet (5 mg total) by mouth daily. 11/05/22  Yes Lucien Orren SAILOR, PA-C  aspirin  EC 81 MG tablet Take 81 mg by mouth daily. Swallow whole.   Yes [provider]  atorvastatin  (LIPITOR) 80 MG tablet Take 1 tablet (80 mg total) by mouth daily. 05/24/23  Yes Turner, Wilbert SAUNDERS, MD  glucosamine-chondroitin 500-400 MG tablet Take 1 tablet by mouth 2 (two) times daily.   Yes [provider]  HYDROcodone -acetaminophen  (NORCO/VICODIN) 5-325 MG tablet Take 1 tablet by mouth every 8 (eight) hours as needed for severe pain (pain score 7-10). 08/28/23  Yes Charlene Debby BROCKS, PA-C  latanoprost  (XALATAN ) 0.005 % ophthalmic solution Place 1 drop into both eyes at bedtime. 07/19/23  Yes [provider]  lidocaine  (SALONPAS PAIN RELIEVING) 4 % Place 1 patch onto the skin daily.   Yes [provider]  lisinopril  (ZESTRIL ) 20 MG tablet Take 1 tablet (20 mg total) by mouth daily. 03/24/23  Yes Lucien, Tessa N, PA-C  metoprolol  succinate (TOPROL -XL) 25 MG 24 hr tablet Take 0.5 tablets (12.5 mg total) by mouth daily. 03/24/23  Yes Conte, Tessa N, PA-C  nitroGLYCERIN  (NITROSTAT ) 0.4 MG SL tablet Place 1 tablet (0.4 mg total) under the tongue every 5 (five) minutes as needed for chest pain. 03/24/23  Yes Conte, Tessa N, PA-C  predniSONE (STERAPRED UNI-PAK 21 TAB) 10 MG (21) TBPK tablet Take 10 mg by mouth See admin instructions. 08/26/23  Yes [provider]  sildenafil (VIAGRA) 100 MG tablet Take 100 mg by mouth daily as needed for erectile dysfunction. 12/13/21  Yes [provider]  tamsulosin  (FLOMAX ) 0.4 MG CAPS capsule Take 0.4 mg by mouth at bedtime. 07/04/21  Yes [provider]  tiZANidine  (ZANAFLEX ) 4 MG tablet Take 1 tablet (4 mg total) by mouth 3 (three) times daily. 08/28/23  Yes Charlene Debby BROCKS, PA-C  vitamin B-12 (CYANOCOBALAMIN ) 500 MCG tablet Take 1,000 mcg by mouth daily.   Yes [provider]    Physical Exam: Vitals:    08/31/23 2233 08/31/23 2245 08/31/23 2315 08/31/23 2345  BP: 110/84 123/89 128/86 124/85  Pulse: (!) 122 73 90 77  Resp: (!) 24 (!) 34 (!) 22 (!) 21  Temp:      TempSrc:      SpO2: 96% 95% 97% 93%  Weight:      Height:        Constitutional: NAD, no pallor or diaphoresis   Eyes: PERTLA, lids and conjunctivae normal ENMT: Mucous membranes are moist. Posterior pharynx clear of any exudate or lesions.   Neck: supple, no masses  Respiratory: no wheezing, no crackles. No accessory muscle  use.  Cardiovascular: S1 & S2 heard, regular rate and rhythm. No extremity edema.  Abdomen: No tenderness, soft. Bowel sounds active.  Musculoskeletal: no clubbing / cyanosis. Tender in right lateral chest wall.   Skin: no significant rashes, lesions, ulcers. Warm, dry, well-perfused. Neurologic: CN 2-12 grossly intact. Moving all extremities. Alert and oriented.  Psychiatric: Calm. Cooperative.    Labs and Imaging on Admission: I have personally reviewed following labs and imaging studies  CBC: Recent Labs  Lab 08/28/23 1440 08/31/23 1856  WBC 9.1 9.1  NEUTROABS  --  6.7  HGB 11.0* 11.3*  HCT 33.5* 34.3*  MCV 88.6 89.8  PLT 220 267   Basic Metabolic Panel: Recent Labs  Lab 08/28/23 1440 08/31/23 1856  NA 131* 137  K 3.8 3.9  CL 97* 102  CO2 22 24  GLUCOSE 146* 108*  BUN 17 23  CREATININE 1.10 1.15  CALCIUM  8.7* 8.5*  MG  --  2.0   GFR: Estimated Creatinine Clearance: 68.2 mL/min (by C-G formula based on SCr of 1.15 mg/dL). Liver Function Tests: Recent Labs  Lab 08/28/23 1440 08/31/23 1856  AST 20 23  ALT 16 23  ALKPHOS 79 71  BILITOT 1.0 0.5  PROT 8.1 6.9  ALBUMIN 3.6 2.7*   Recent Labs  Lab 08/31/23 1856  LIPASE 40   No results for input(s): AMMONIA in the last 168 hours. Coagulation Profile: No results for input(s): INR, PROTIME in the last 168 hours. Cardiac Enzymes: No results for input(s): CKTOTAL, CKMB, CKMBINDEX, TROPONINI in the last 168  hours. BNP (last 3 results) No results for input(s): PROBNP in the last 8760 hours. HbA1C: No results for input(s): HGBA1C in the last 72 hours. CBG: No results for input(s): GLUCAP in the last 168 hours. Lipid Profile: No results for input(s): CHOL, HDL, LDLCALC, TRIG, CHOLHDL, LDLDIRECT in the last 72 hours. Thyroid  Function Tests: No results for input(s): TSH, T4TOTAL, FREET4, T3FREE, THYROIDAB in the last 72 hours. Anemia Panel: No results for input(s): VITAMINB12, FOLATE, FERRITIN, TIBC, IRON, RETICCTPCT in the last 72 hours. Urine analysis:    Component Value Date/Time   COLORURINE AMBER (A) 08/31/2023 1858   APPEARANCEUR HAZY (A) 08/31/2023 1858   APPEARANCEUR Clear 12/07/2017 1354   LABSPEC 1.026 08/31/2023 1858   PHURINE 5.0 08/31/2023 1858   GLUCOSEU NEGATIVE 08/31/2023 1858   HGBUR NEGATIVE 08/31/2023 1858   BILIRUBINUR NEGATIVE 08/31/2023 1858   BILIRUBINUR Negative 12/07/2017 1354   KETONESUR NEGATIVE 08/31/2023 1858   PROTEINUR 30 (A) 08/31/2023 1858   NITRITE NEGATIVE 08/31/2023 1858   LEUKOCYTESUR NEGATIVE 08/31/2023 1858   Sepsis Labs: @LABRCNTIP (procalcitonin:4,lacticidven:4) )No results found for this or any previous visit (from the past 240 hours).   Radiological Exams on Admission: US  Abdomen Limited RUQ (LIVER/GB) Result Date: 08/31/2023 CLINICAL DATA:  Pain EXAM: ULTRASOUND ABDOMEN LIMITED RIGHT UPPER QUADRANT COMPARISON:  CT abdomen and pelvis 08/31/2023. FINDINGS: Gallbladder: Multiple gallstones are present measuring up to 1.8 cm. There is no pericholecystic fluid or gallbladder wall thickening. No sonographic Murphy sign noted by sonographer. Common bile duct: Diameter: 4.2 mm Liver: No focal lesion identified. Within normal limits in parenchymal echogenicity. Portal vein is patent on color Doppler imaging with normal direction of blood flow towards the liver. Other: None. IMPRESSION: Cholelithiasis without  sonographic evidence of acute cholecystitis. Electronically Signed   By: Greig Pique M.D.   On: 08/31/2023 23:52   CT Angio Chest PE W and/or Wo Contrast Result Date: 08/31/2023 CLINICAL DATA:  Probability pulmonary embolism  EXAM: CT ANGIOGRAPHY CHEST WITH CONTRAST TECHNIQUE: Multidetector CT imaging of the chest was performed using the standard protocol during bolus administration of intravenous contrast. Multiplanar CT image reconstructions and MIPs were obtained to evaluate the vascular anatomy. RADIATION DOSE REDUCTION: This exam was performed according to the departmental dose-optimization program which includes automated exposure control, adjustment of the mA and/or kV according to patient size and/or use of iterative reconstruction technique. CONTRAST:  75mL OMNIPAQUE  IOHEXOL  350 MG/ML SOLN COMPARISON:  None Available. FINDINGS: Cardiovascular: There is adequate opacification of the pulmonary arterial tree. No intraluminal filling defect identified to suggest acute pulmonary embolism. The central pulmonary arteries are enlarged in keeping with changes of pulmonary arterial hypertension. Coronary artery bypass grafting has been performed. Mild global cardiomegaly. No pericardial effusion. No significant atherosclerotic calcification within the thoracic aorta. No aortic aneurysm. Mediastinum/Nodes: No enlarged mediastinal, hilar, or axillary lymph nodes. Thyroid  gland, trachea, and esophagus demonstrate no significant findings. Lungs/Pleura: Moderate right pleural effusion with associated right basilar dependent atelectasis. No superimposed confluent pulmonary infiltrate. No pneumothorax. No pleural effusion on the left. No central obstructing lesion. Upper Abdomen: No acute abnormality. Musculoskeletal: No chest wall abnormality. No acute or significant osseous findings. Review of the MIP images confirms the above findings. IMPRESSION: 1. No pulmonary embolism. 2. Morphologic changes in keeping with  pulmonary arterial hypertension. 3. Mild global cardiomegaly. Status post coronary artery bypass grafting. 4. Moderate right pleural effusion with associated right basilar dependent atelectasis. Electronically Signed   By: Dorethia Molt M.D.   On: 08/31/2023 22:04   CT ABDOMEN PELVIS W CONTRAST Result Date: 08/31/2023 CLINICAL DATA:  Acute abdominal pain EXAM: CT ABDOMEN AND PELVIS WITH CONTRAST TECHNIQUE: Multidetector CT imaging of the abdomen and pelvis was performed using the standard protocol following bolus administration of intravenous contrast. RADIATION DOSE REDUCTION: This exam was performed according to the departmental dose-optimization program which includes automated exposure control, adjustment of the mA and/or kV according to patient size and/or use of iterative reconstruction technique. CONTRAST:  75mL OMNIPAQUE  IOHEXOL  350 MG/ML SOLN COMPARISON:  None Available. FINDINGS: Hepatobiliary: Gallbladder is well distended with multiple dependent gallstones. The liver is within normal limits. Pancreas: Unremarkable. No pancreatic ductal dilatation or surrounding inflammatory changes. Spleen: Normal in size without focal abnormality. Adrenals/Urinary Tract: Adrenal glands are within normal limits bilaterally. Kidneys demonstrate a normal enhancement pattern bilaterally. Few small cysts are noted stable from the prior exam. No follow-up is recommended. No obstructive changes are seen. The bladder is partially distended. Stomach/Bowel: Colon is distended with air although no obstructive or inflammatory changes are seen. Mild scattered fecal material is noted. The appendix is not well visualized. No inflammatory changes to suggest appendicitis are noted. Small bowel and stomach are within normal limits. Vascular/Lymphatic: Aortic atherosclerosis. No enlarged abdominal or pelvic lymph nodes. Reproductive: Prostate is unremarkable. Other: No abdominal wall hernia or abnormality. No abdominopelvic ascites.  Musculoskeletal: No acute or significant osseous findings. IMPRESSION: Cholelithiasis without complicating factors. No other focal abnormality is seen. Electronically Signed   By: Oneil Devonshire M.D.   On: 08/31/2023 21:54   CT L-SPINE NO CHARGE Result Date: 08/31/2023 CLINICAL DATA:  Low back pain EXAM: CT LUMBAR SPINE WITHOUT CONTRAST TECHNIQUE: Multidetector CT imaging of the lumbar spine was performed without intravenous contrast administration. Multiplanar CT image reconstructions were also generated. RADIATION DOSE REDUCTION: This exam was performed according to the departmental dose-optimization program which includes automated exposure control, adjustment of the mA and/or kV according to patient size and/or use of iterative  reconstruction technique. COMPARISON:  None Available. FINDINGS: Segmentation: Transitional lumbar anatomy with a lumbarized S1 present and a relatively well developed disc space at S1-2. Alignment: 2-3 mm anterolisthesis L5-S1. Vertebrae: No acute fracture or focal pathologic process. Paraspinal and other soft tissues: See report for accompanying CT examination of the abdomen and pelvis. No paraspinal inflammatory change or fluid collection identified. Disc levels: Intervertebral disc heights are preserved. Spinal canal is widely patent. Osteophytic spurring results in mild bilateral neuroforaminal narrowing at L4-5 and on the right at L5-S1. Remaining neural foramina are widely patent. Mild to moderate facet arthrosis at L5-S1, left greater than right. IMPRESSION: 1. No acute fracture or listhesis of the lumbar spine. 2. Transitional lumbar anatomy with a lumbarized S1 present and a relatively well developed disc space at S1-2. 3. Mild to moderate facet arthrosis at L5-S1, left greater than right. 4. Mild bilateral neuroforaminal narrowing at L4-5 and on the right at L5-S1. Electronically Signed   By: Dorethia Molt M.D.   On: 08/31/2023 21:53   CT Head Wo Contrast Result Date:  08/31/2023 CLINICAL DATA:  Altered mental status EXAM: CT HEAD WITHOUT CONTRAST TECHNIQUE: Contiguous axial images were obtained from the base of the skull through the vertex without intravenous contrast. RADIATION DOSE REDUCTION: This exam was performed according to the departmental dose-optimization program which includes automated exposure control, adjustment of the mA and/or kV according to patient size and/or use of iterative reconstruction technique. COMPARISON:  None Available. FINDINGS: Brain: No evidence of acute infarction, hemorrhage, hydrocephalus, extra-axial collection or mass lesion/mass effect. Vascular: No hyperdense vessel or unexpected calcification. Skull: Normal. Negative for fracture or focal lesion. Sinuses/Orbits: No acute finding. Other: Mastoid air cells and middle ear cavities are clear. IMPRESSION: 1. No acute intracranial abnormality. Electronically Signed   By: Dorethia Molt M.D.   On: 08/31/2023 21:47   DG Chest Portable 1 View Result Date: 08/31/2023 CLINICAL DATA:  Shortness of breath EXAM: PORTABLE CHEST 1 VIEW COMPARISON:  01/14/2022 FINDINGS: Cardiac shadow is enlarged. Postsurgical changes are again seen. The overall inspiratory effort is poor. Small right pleural effusion is noted with basilar atelectasis. No bony abnormality is noted. IMPRESSION: Right basilar atelectasis and small effusion. Electronically Signed   By: Oneil Devonshire M.D.   On: 08/31/2023 19:04    EKG: Independently reviewed. Atrial fibrillation, rate 120, PVC.   Assessment/Plan   1. Acute on chronic low back pain  - Check MRI, continue pain-control    2. New atrial fibrillation with RVR  - EF was preserved, there was no significant valvular disease, and atrial dimensions were normal on TTE from April 2025; he had normal, low-risk myocardial perfusion imaging in May 2025  - May have been precipitated by prednisone for back pain and/or severe pain  - CHADS-VASc is 31 (age, HTN, TIA, CAD)  - He has  imaging studies pending, will use IV heparin  for now and likely transition to DOAC if no indications for surgery on imaging; continue rate-control with diltiazem  infusion   3. CAD  - Continue ASA, Lipitor, metoprolol     4. Hypertension  - Hold Norvasc  and lisinopril  for now while titrating rate-control medications    DVT prophylaxis: IV heparin   Code Status: Full Level of Care: Level of care: Progressive Family Communication: None present Disposition Plan:  Patient is from: Home  Anticipated d/c is to: Home  Anticipated d/c date is: 09/04/23  Patient currently: Pending rate-control, pain-control, MRIs, transition to oral medications  Consults called: None  Admission status:  Inpatient     Evalene GORMAN Sprinkles, MD [facility]  09/01/2023, 12:01 AM

## 2023-09-01 NOTE — Progress Notes (Signed)
   09/01/23 2307  Assess: MEWS Score  Temp 98.4 F (36.9 C)  BP (!) 77/51  MAP (mmHg) (!) 60  Pulse Rate 69  ECG Heart Rate 67  Resp 18  SpO2 94 %  Assess: MEWS Score  MEWS Temp 0  MEWS Systolic 2  MEWS Pulse 0  MEWS RR 0  MEWS LOC 0  MEWS Score 2  MEWS Score Color Yellow  Assess: if the MEWS score is Yellow or Red  Were vital signs accurate and taken at a resting state? Yes  Does the patient meet 2 or more of the SIRS criteria? No  MEWS guidelines implemented  Yes, yellow  Treat  MEWS Interventions Considered administering scheduled or prn medications/treatments as ordered  Take Vital Signs  Increase Vital Sign Frequency  Yellow: Q2hr x1, continue Q4hrs until patient remains green for 12hrs  Escalate  MEWS: Escalate Yellow: Discuss with charge nurse and consider notifying provider and/or RRT  Notify: Charge Nurse/RN  Name of Charge Nurse/RN Notified [place], RN  Provider Notification  Provider Name/Title Keturah, MD  Date Provider Notified 09/01/23  Time Provider Notified 2310  Method of Notification Page  Notification Reason Change in status  Provider response See new orders  Date of Provider Response 09/01/23  Time of Provider Response 2320  Assess: SIRS CRITERIA  SIRS Temperature  0  SIRS Respirations  0  SIRS Pulse 0  SIRS WBC 0  SIRS Score Sum  0

## 2023-09-01 NOTE — Progress Notes (Signed)
 PROGRESS NOTE Jacob STRAUSSER  FMW:[id number] DOB: July 11, 1950 DOA: 08/31/2023 PCP: [name], MD  Brief Narrative/Hospital Course: 28 yom w/ HTN HLD CAD s/p CABG, Crohn's disease, and chronic LBP who presented with multiple complaints including acute worsening in his chronic back pain, new right flank/RUQ pain, and dizziness presented to the ED with worsening of low back pain, difficulty ambulating due to severe pain and EMS found  him in rapid atrial fibrillation and administered 800 mL of IV fluids and 200 mcg of fentanyl  prior to arrival in the ED. In ED-afebrile, tachycardic, BP fairly stable labs with albumin 2.7, normal WBC, normal troponin x 2, BNP 154, and D-dimer 1.50. CTA chest>> negative for PE,no acute finding on CT of the abd/pelvis, head CT. Us >cholelithiasis but no evidence for acute cholecystitis. Patient was given IV fluids pain medication and placed on Cardizem  drip. In ED had extensive evaluation with chest x-ray CT head CT angio chest PE, CT abdomen pelvis with contrast CT L-spine ultrasound abdomen and MRI brain see the report.  MRI brain, MRI lumbar spine> no acute intracranial finding, likely chronic microvascular ischemic disease, no acute abnormality in the lumbar spine disposition at L4-5, L2-L3 and other facet hypertrophy eccentric disc bulge- SEE REPORT Patient was admitted for further management  Subjective: Seen and examined today Overnight BP stable afebrile Labs reviewed this morning stable BMP, CBC Family at bedside concerned that he has had recurrent ED visits for pain His HR stable in 100 a fib. Unable to walk due to low back pain, has neuropathy in legs, able to bend his kness b/l, abel to feel sensation in leg. Rt rib/chest wall pain, on 2l [place].  Assessment and plan:  Acute on chronic low back pain: Symptoms going on for months he has had recurrent ED visits at this time unable to mobilize has had falls, and ongoing pain.  Supposed to see Dr.  Louis. Of note he was seen by, Beverley and Jacklin PA PA 2 wks ago and was given inj on  butt that made pain worse MRI lumbar spine reviewed, patient has disc bulges, facet arthropathy and other findings see report-for more detail. Will touch base with neurosurgery team Dr Darnella. Will try multimodal pain management-add scheduled Tylenol , Toradol  as needed, robaxin , IV /ora prnl opiates AND PT OT once  pain better. Extensively discussed with patient and family at the bedside They are worried about him going back home again  Imbalance/wobbly gait and numbness tingling toes and balls of the feet for 2-3 yrs Multiple falls: Since 2022 patient has been getting wobbly gait, and as he stands up he staggers and feels like  he was drunk and he has had falls He has been seeing Neuro Dr. Tonita Blanch he had B12 deficiency 2 on 1 that was replaced and his balance had improved however numbness in the feet is unchanged apparently he was supposed to get PET scan on 8/13, as his tremors are getting worse and bothering him when he is trying to eat and do fine motor task balance is not very good and using cane at home last seen on 03/31/2023 for.  His MRI brain 12/2721 no acute finding and had CTA head and neck and December 2024 showing right carotid endarterectomy.  His gabapentin  has been discontinued since.  MRI brain has no acute finding Check TSH B12.  New atrial fibrillation with RVR: Recent echo from April normal EF no valvular disease and also had  low-risk myocardial perfusion imaging in May  2025  On Cardizem  drip, metoprolol  and heparin  drip.Obtain echocardiogram, TSH.  Will get cardiology evaluation as well  Moderate right pleural effusion with associated right basilar dependent atelectasis: Noted on the CT scan does have pleuritic right-sided chest pain.  Getting echo to r/u chf.  Carotid artery stenosis: Patient has had endarterectomy.  Continue aspirin  and statin.  History of chron's  disease Stable  Asymptomatic cholelithiasis: Noted on ultrasound, LFTs normal  CAD  Continue ASA, Lipitor, metoprolol      Hypertension  BP controlled will discontinue home lisinopril /Norvasc  in lieu of Cardizem  and metoprolol   Mild anemia: Follow-up with PCP  DVT prophylaxis: Heparin  Code Status:   Code Status: Full Code Family Communication: plan of care discussed with patient/son and wife at bedside. Patient status is: Remains hospitalized because of severity of illness Level of care: Progressive   Dispo: The patient is from: Home with family            Anticipated disposition: TBD Objective: Vitals last 24 hrs: Vitals:   09/01/23 0800 09/01/23 0900 09/01/23 0915 09/01/23 1038  BP: 128/82 123/81 106/69 109/74  Pulse: 94 (!) 109 82 98  Resp: (!) 23 (!) 21 18 18   Temp:    98.3 F (36.8 C)  TempSrc:    Oral  SpO2: 95% 94% 94% 95%  Weight:      Height:        Physical Examination: General exam: alert awake, appears sweaty, in pain  HEENT:Oral mucosa moist, Ear/Nose WNL grossly Respiratory system: Bilaterally clear BS,no use of accessory muscle Cardiovascular system: S1 & S2 +, No JVD.  Irregularly irregular Gastrointestinal system: Abdomen soft,NT,ND, BS+ abdomen appears full Nervous System: Alert, awake, able to move upper extremities well, able to bend both knees, sensation intact bilateral feet Extremities: LE edema neg, distal extremities warm.  Tenderness on lumbar spine and on the right flank Skin: No rashes,no icterus. MSK: Normal muscle bulk,tone, power   Medications reviewed:  Scheduled Meds:  acetaminophen   1,000 mg Oral TID   aspirin  EC  81 mg Oral Daily   atorvastatin   80 mg Oral Daily   latanoprost   1 drop Both Eyes QHS   lidocaine   1 patch Transdermal Q24H   metoprolol  succinate  12.5 mg Oral Daily   sodium chloride  flush  3 mL Intravenous Q12H   tamsulosin   0.4 mg Oral QHS   tiZANidine   4 mg Oral TID   Continuous Infusions:  diltiazem   (CARDIZEM ) infusion 12.5 mg/hr (09/01/23 9361)   heparin  1,400 Units/hr (09/01/23 0233)   Diet: Diet Order             Diet regular Room service appropriate? Yes; Fluid consistency: Thin  Diet effective now                    Data Reviewed: I have personally reviewed following labs and imaging studies ( see epic result tab) CBC: Recent Labs  Lab 08/28/23 1440 08/31/23 1856 09/01/23 0450  WBC 9.1 9.1 8.6  NEUTROABS  --  6.7  --   HGB 11.0* 11.3* 11.2*  HCT 33.5* 34.3* 34.0*  MCV 88.6 89.8 89.2  PLT 220 267 260   CMP: Recent Labs  Lab 08/28/23 1440 08/31/23 1856 09/01/23 0450  NA 131* 137 135  K 3.8 3.9 4.1  CL 97* 102 103  CO2 22 24 23   GLUCOSE 146* 108* 97  BUN 17 23 19   CREATININE 1.10 1.15 1.03  CALCIUM  8.7* 8.5* 8.2*  MG  --  2.0 1.9   GFR: Estimated Creatinine Clearance: 77.5 mL/min (by C-G formula based on SCr of 1.03 mg/dL). Recent Labs  Lab 08/28/23 1440 08/31/23 1856  AST 20 23  ALT 16 23  ALKPHOS 79 71  BILITOT 1.0 0.5  PROT 8.1 6.9  ALBUMIN 3.6 2.7*    Recent Labs  Lab 08/31/23 1856  LIPASE 40   No results for input(s): AMMONIA in the last 168 hours. Coagulation Profile: No results for input(s): INR, PROTIME in the last 168 hours. Unresulted Labs (From admission, onward)     Start     Ordered   09/01/23 1116  Vitamin B12  Add-on,   AD        09/01/23 1115   09/01/23 1000  Heparin  level (unfractionated)  Once-Timed,   TIMED        09/01/23 0017   09/01/23 0500  Basic metabolic panel  Daily,   R      09/01/23 0000   09/01/23 0500  CBC  Daily,   R      09/01/23 0000   09/01/23 0001  TSH  Once,   R        09/01/23 0000           Antimicrobials/Microbiology: Anti-infectives (From admission, onward)    None      No results found for: SDES, SPECREQUEST, CULT, REPTSTATUS  Procedures:  Mennie LAMY, MD [facility] 09/01/2023, 12:32 PM

## 2023-09-01 NOTE — Plan of Care (Signed)
  Problem: Education: Goal: Knowledge of General Education information will improve Description: Including pain rating scale, medication(s)/side effects and non-pharmacologic comfort measures Outcome: Progressing   Problem: Health Behavior/Discharge Planning: Goal: Ability to manage health-related needs will improve Outcome: Progressing   Problem: Clinical Measurements: Goal: Will remain free from infection Outcome: Progressing Goal: Diagnostic test results will improve Outcome: Progressing Goal: Respiratory complications will improve Outcome: Progressing   Problem: Activity: Goal: Risk for activity intolerance will decrease Outcome: Progressing   Problem: Pain Managment: Goal: General experience of comfort will improve and/or be controlled Outcome: Progressing   Problem: Education: Goal: Knowledge of disease or condition will improve Outcome: Progressing   Problem: Health Behavior/Discharge Planning: Goal: Ability to safely manage health-related needs after discharge will improve Outcome: Progressing

## 2023-09-01 NOTE — TOC CM/SW Note (Addendum)
 Transition of Care [facility]) - Inpatient Brief Assessment   Patient Details  Name: Jacob [name] MRN: [id number] Date of Birth: 06-16-1950  Transition of Care [facility]) CM/SW Contact:    Lauraine FORBES Saa, LCSWA Phone Number: 09/01/2023, 4:01 PM   Clinical Narrative:  4:01 PM Per chart review, patient resides at home with spouse and relative. Patient has a PCP and insurance. Patient does not have SNF/HH/DME history. Patient's preferred pharmacy is [facility]. CSW provided patient SDOH (food) resources. Patient consented CSW to speak to and in front of patient's spouse and son. No TOC needs were identified at this time. TOC will continue to follow and be available to assist.  Transition of Care Asessment: Insurance and Status: Insurance coverage has been reviewed Patient has primary care physician: Yes Home environment has been reviewed: Private Residence Prior level of function:: N/A Prior/Current Home Services: No current home services Social Drivers of Health Review: SDOH reviewed interventions complete Readmission risk has been reviewed: Yes (Currently Green 13%) Transition of care needs: no transition of care needs at this time

## 2023-09-01 NOTE — Consult Note (Signed)
 Cardiology Consultation   Patient ID: Jacob [name] MRN: [id number]; DOB: February 04, 1950  Admit date: 08/31/2023 Date of Consult: 09/01/2023  PCP:  [name], MD   [facility] Providers Cardiologist:  Wilbert Bihari, MD      Patient Profile: Jacob [name] is a 73 y.o. male with a hx of hypertension, hyperlipidemia, carotid artery stenosis s/p endarterectomy in 2018, prior TIA, prior stroke, Crohn's disease, ulcerative colitis, and multivessel CAD s/p CABG 2005 and PCI to SVG to RI in 2008 who is being seen 09/01/2023 for the evaluation of new onset atrial fibrillation at the request of CHRISTOBAL Guadalajara MD.  History of Present Illness: Mr. [name] is a 73 year old male with prior cardiac history listed below.  He has multivessel CAD s/p CABG 2005 and PCI to SVG to RI in 2008. In addition has carotid artery stenosis s/p endarterectomy in 2018.  In the past the patient reportedly developed bradycardia when started on 25 mg of metoprolol  succinate daily.  Has been stable on 12.5 mg metoprolol  succinate daily.  On 10/2022 the patient was seen in the emergency department following a fall.  He had significant ecchymosis and swelling on his left leg below the knee because of this fall.  Most recent echocardiogram was done on 04/2023 and showed a normal LVEF of 60-65%, no regional wall motion abnormalities, mild aortic valve thickening with no stenosis or regurgitation, valve function was grossly normal, and normal RV systolic function.  At last follow-up with Dr. Bihari on 05/2023 patient was complaining of sharp chest pain and dyspnea on exertion.  A nuclear stress test was done later that month and showed no evidence of ischemia or infarction.   Patient presented to the emergency department by EMS services for abdominal pain, low back pain, and chronic dizziness.  Well being transferred by EMS patient was found to be in A-fib with RVR.  Patient also reportedly had a fall on  07/30/2023 while he was working in the yard.  EKG yesterday showed atrial fibrillation with a rate of 120 and a wandering baseline in multiple leads.  He was started on 12.5 mg metoprolol  daily, Cardizem  bolus and IV infusion.  Today was started on IV heparin .  Blood pressure stable most recent BP 105/85.  On interview patient and family members reported that his back pain and flank pain that he has had over the past month are the main reason he came to the emergency department.  It was because of this back pain and flank pain that he fell off the edge of the bed yesterday morning.  Has also had some dizziness when standing up or walking around.  Has also had some dyspnea on exertion.  Also had lower extremity edema and a cough for the past few weeks. Has had 3 falls in the past 6 months. One was because the patient had seizures, and the recent one appears to be related to difficulty with movement related to his back pain.   Labs showed potassium of 4.1, magnesium  of 2.0, creatinine of 1.03, decreased calcium  of 8.2, normocytic anemia with a hemoglobin of 11.2, normal high-sensitivity troponin 5> 4, and elevated BNP of 154.1.  Pulmonary CTA found no pulmonary embolism, changes concerning for pulmonary hypertension, cardiomegaly, moderate right pleural effusion, and right basilar atelectasis.   Past Medical History:  Diagnosis Date   CAD (coronary artery disease), native coronary artery     multivessel ASCAD s/p CABG and s/p PCI of SVG to RI 2008  Carotid artery stenosis    patent right CEA site and 1-39% Lcarotid stenosis   Crohn's disease (HCC)    flare 2000   Depression    Dyslipidemia    Dyspnea    ED (erectile dysfunction)    History of blood transfusion 2005   Hypertension    Myocardial infarction [facility]) 2005   5 bypasses   Nephrolithiasis    Stroke [facility])    TIA- Endartarectemy R side   TIA (transient ischemic attack)    Ulcerative colitis (HCC)    Left sided    Past Surgical  History:  Procedure Laterality Date   CARDIAC CATHETERIZATION  06/08   Patnet grafts LAD, marginal, distal right   carpel tunnel surgery Left    COLONOSCOPY     COLONOSCOPY WITH PROPOFOL  N/A 10/25/2012   Procedure: COLONOSCOPY WITH PROPOFOL ;  Surgeon: Gladis MARLA Louder, MD;  Location: [facility];  Service: Endoscopy;  Laterality: N/A;   CORONARY ARTERY BYPASS GRAFT  2005   ASCVD,multivessel,S/P CABG  x5 total per patient   ENDARTERECTOMY Right 06/24/2016   Procedure: ENDARTERECTOMY CAROTID;  Surgeon: Marea Selinda RAMAN, MD;  Location: [facility] ORS;  Service: Vascular;  Laterality: Right;   HERNIA REPAIR  many years ago   KNEE ARTHROSCOPY Right 1995     Home Medications:  Prior to Admission medications   Medication Sig Start Date End Date Taking? Authorizing Provider  amLODipine  (NORVASC ) 5 MG tablet Take 1 tablet (5 mg total) by mouth daily. 11/05/22  Yes Lucien Orren SAILOR, PA-C  aspirin  EC 81 MG tablet Take 81 mg by mouth daily. Swallow whole.   Yes [provider]  atorvastatin  (LIPITOR) 80 MG tablet Take 1 tablet (80 mg total) by mouth daily. 05/24/23  Yes Turner, Wilbert SAUNDERS, MD  glucosamine-chondroitin 500-400 MG tablet Take 1 tablet by mouth 2 (two) times daily.   Yes [provider]  HYDROcodone -acetaminophen  (NORCO/VICODIN) 5-325 MG tablet Take 1 tablet by mouth every 8 (eight) hours as needed for severe pain (pain score 7-10). 08/28/23  Yes Charlene Debby BROCKS, PA-C  latanoprost  (XALATAN ) 0.005 % ophthalmic solution Place 1 drop into both eyes at bedtime. 07/19/23  Yes [provider]  lidocaine  (SALONPAS PAIN RELIEVING) 4 % Place 1 patch onto the skin daily.   Yes [provider]  lisinopril  (ZESTRIL ) 20 MG tablet Take 1 tablet (20 mg total) by mouth daily. 03/24/23  Yes Lucien Orren SAILOR, PA-C  metoprolol  succinate (TOPROL -XL) 25 MG 24 hr tablet Take 0.5 tablets (12.5 mg total) by mouth daily. 03/24/23  Yes Conte, Tessa N, PA-C  nitroGLYCERIN  (NITROSTAT ) 0.4 MG SL tablet  Place 1 tablet (0.4 mg total) under the tongue every 5 (five) minutes as needed for chest pain. 03/24/23  Yes Conte, Tessa N, PA-C  predniSONE (STERAPRED UNI-PAK 21 TAB) 10 MG (21) TBPK tablet Take 10 mg by mouth See admin instructions. 08/26/23  Yes [provider]  sildenafil (VIAGRA) 100 MG tablet Take 100 mg by mouth daily as needed for erectile dysfunction. 12/13/21  Yes [provider]  tamsulosin  (FLOMAX ) 0.4 MG CAPS capsule Take 0.4 mg by mouth at bedtime. 07/04/21  Yes [provider]  tiZANidine  (ZANAFLEX ) 4 MG tablet Take 1 tablet (4 mg total) by mouth 3 (three) times daily. 08/28/23  Yes Charlene Debby BROCKS, PA-C  vitamin B-12 (CYANOCOBALAMIN ) 500 MCG tablet Take 1,000 mcg by mouth daily.   Yes [provider]    Scheduled Meds:  acetaminophen   1,000 mg Oral TID  aspirin  EC  81 mg Oral Daily   atorvastatin   80 mg Oral Daily   latanoprost   1 drop Both Eyes QHS   lidocaine   1 patch Transdermal Q24H   metoprolol  succinate  12.5 mg Oral Daily   sodium chloride  flush  3 mL Intravenous Q12H   tamsulosin   0.4 mg Oral QHS   tiZANidine   4 mg Oral TID   Continuous Infusions:  diltiazem  (CARDIZEM ) infusion 12.5 mg/hr (09/01/23 9361)   heparin  1,400 Units/hr (09/01/23 0233)   PRN Meds: acetaminophen  **OR** acetaminophen , bisacodyl , HYDROmorphone  (DILAUDID ) injection, ketorolac , methocarbamol  (ROBAXIN ) injection, oxyCODONE , polyethylene glycol, prochlorperazine   Allergies:   No Known Allergies  Social History:   Social History   Socioeconomic History   Marital status: Married    Spouse name: Not on file   Number of children: 5   Years of education: Not on file   Highest education level: Not on file  Occupational History   Occupation: Retired   Tobacco Use   Smoking status: Never   Smokeless tobacco: Current    Types: Snuff  Vaping Use   Vaping status: Never Used  Substance and Sexual Activity   Alcohol use: No   Drug use: No   Sexual activity:  Not on file  Other Topics Concern   Not on file  Social History Narrative   Are you right handed or left handed? Right Handed   Are you currently employed ?    What is your current occupation?   Do you live at home alone? No    Who lives with you? Wife, grandson, and adopted son.    What type of home do you live in: 1 story or 2 story? Lives in a one story home       Social Drivers of Health   Financial Resource Strain: Not on file  Food Insecurity: Food Insecurity Present (09/01/2023)   Hunger Vital Sign    Worried About Running Out of Food in the Last Year: Often true    Ran Out of Food in the Last Year: Often true  Transportation Needs: No Transportation Needs (09/01/2023)   PRAPARE - Administrator, Civil Service (Medical): No    Lack of Transportation (Non-Medical): No  Physical Activity: Not on file  Stress: Not on file  Social Connections: Socially Integrated (09/01/2023)   Social Connection and Isolation Panel    Frequency of Communication with Friends and Family: More than three times a week    Frequency of Social Gatherings with Friends and Family: More than three times a week    Attends Religious Services: More than 4 times per year    Active Member of [employer] or Organizations: Yes    Attends Engineer, structural: More than 4 times per year    Marital Status: Married  Catering manager Violence: Not At Risk (09/01/2023)   Humiliation, Afraid, Rape, and Kick questionnaire    Fear of Current or Ex-Partner: No    Emotionally Abused: No    Physically Abused: No    Sexually Abused: No    Family History:    Family History  Problem Relation Age of Onset   Ovarian cancer Mother    CAD Father    CAD Sister    CAD Brother    CAD Brother    CAD Brother    Colon cancer Neg Hx    Esophageal cancer Neg Hx    Stomach cancer Neg Hx    Rectal cancer Neg Hx  Colon polyps Neg Hx      ROS:  Please see the history of present illness.   All other ROS  reviewed and negative.     Physical Exam/Data: Vitals:   09/01/23 0900 09/01/23 0915 09/01/23 1038 09/01/23 1118  BP: 123/81 106/69 109/74 105/85  Pulse: (!) 109 82 98 88  Resp: (!) 21 18 18    Temp:   98.3 F (36.8 C) 98.6 F (37 C)  TempSrc:   Oral Oral  SpO2: 94% 94% 95% 93%  Weight:    95.4 kg  Height:    5' 11 (1.803 m)    Intake/Output Summary (Last 24 hours) at 09/01/2023 1204 Last data filed at 09/01/2023 1154 Gross per 24 hour  Intake 1047.94 ml  Output --  Net 1047.94 ml      09/01/2023   11:18 AM 08/31/2023    6:30 PM 08/28/2023    1:14 PM  Last 3 Weights  Weight (lbs) 210 lb 5.1 oz 202 lb 214 lb  Weight (kg) 95.4 kg 91.627 kg 97.07 kg     Body mass index is 29.33 kg/m.  General:  Well nourished, well developed, in no acute distress HEENT: normal Neck: no JVD Vascular: No carotid bruits; Distal pulses 2+ bilaterally Cardiac:  normal S1, S2; irregularly irregular rhythm no murmur. Lungs:  mild wheezing but patient has difficulty taking in deep breaths with his pain, no rhonchi or rales.  Abd: soft, nontender, no hepatomegaly  Ext: 1+ bilateral lower extremity edema Musculoskeletal:  No deformities, patient had difficulty sitting up because of his back pain.  Skin: warm and dry  Neuro:  CNs 2-12 intact, no focal abnormalities noted Psych:  Normal affect   EKG:  The EKG was personally reviewed and demonstrates:  atrial fibrillation with a rate of 120 and a wandering baseline in multiple leads.   Telemetry:  Telemetry was personally reviewed and demonstrates: Atrial fibrillation with heart rates in the 90s.  In the ER had elevated heart rates in the 120s to 90s. Has had a few pauses the longest of which was 1.63.   Relevant CV Studies: Echocardiogram pending  Laboratory Data: High Sensitivity Troponin:   Recent Labs  Lab 08/31/23 1856 08/31/23 2012  TROPONINIHS 5 4     Chemistry Recent Labs  Lab 08/28/23 1440 08/31/23 1856 09/01/23 0450  NA 131*  137 135  K 3.8 3.9 4.1  CL 97* 102 103  CO2 22 24 23   GLUCOSE 146* 108* 97  BUN 17 23 19   CREATININE 1.10 1.15 1.03  CALCIUM  8.7* 8.5* 8.2*  MG  --  2.0 1.9  GFRNONAA >60 >60 >60  ANIONGAP 12 11 9     Recent Labs  Lab 08/28/23 1440 08/31/23 1856  PROT 8.1 6.9  ALBUMIN 3.6 2.7*  AST 20 23  ALT 16 23  ALKPHOS 79 71  BILITOT 1.0 0.5   Lipids No results for input(s): CHOL, TRIG, HDL, LABVLDL, LDLCALC, CHOLHDL in the last 168 hours.  Hematology Recent Labs  Lab 08/28/23 1440 08/31/23 1856 09/01/23 0450  WBC 9.1 9.1 8.6  RBC 3.78* 3.82* 3.81*  HGB 11.0* 11.3* 11.2*  HCT 33.5* 34.3* 34.0*  MCV 88.6 89.8 89.2  MCH 29.1 29.6 29.4  MCHC 32.8 32.9 32.9  RDW 12.7 12.8 13.1  PLT 220 267 260   Thyroid  No results for input(s): TSH, FREET4 in the last 168 hours.  BNP Recent Labs  Lab 08/31/23 2012  BNP 154.1*    DDimer  Recent  Labs  Lab 08/31/23 1856  DDIMER 1.50*    Radiology/Studies:  MR LUMBAR SPINE WO CONTRAST Result Date: 09/01/2023 CLINICAL DATA:  Initial evaluation for acute lumbar radiculopathy. EXAM: MRI LUMBAR SPINE WITHOUT CONTRAST TECHNIQUE: Multiplanar, multisequence MR imaging of the lumbar spine was performed. No intravenous contrast was administered. COMPARISON:  CT from 08/31/2023. FINDINGS: Segmentation: Transitional features about the lumbosacral junction with a lumbarized S1 segment and rudimentary S1-2 interspace. Alignment: Mild exaggeration of the normal lumbar lordosis. Trace degenerative retrolisthesis of L1 on L2. Vertebrae: Vertebral body height maintained without acute or chronic fracture. Bone marrow signal intensity within normal limits. No discrete or worrisome osseous lesions or abnormal marrow edema. Conus medullaris and cauda equina: Conus extends to the T12-L1 level. Conus and cauda equina appear normal. Paraspinal and other soft tissues: Unremarkable. Disc levels: L1-2: Disc desiccation with diffuse disc bulge, slightly  asymmetric to the left. Minimal reactive endplate spurring. No spinal stenosis. Foramina remain patent. L2-3: Disc desiccation. Small left foraminal to extraforaminal disc protrusion closely approximates the exiting left L2 nerve root. Mild bilateral facet hypertrophy. No spinal stenosis. Foramina remain patent. L3-4: Mild intervertebral disc space narrowing with diffuse disc bulge and disc desiccation. Disc bulging asymmetric to the right with right-sided reactive endplate spurring. Mild bilateral facet arthrosis. Resultant mild to moderate narrowing of the right lateral recess. Central canal remains patent. Moderate right L3 foraminal stenosis. Left neural foramen remains patent. L4-5: Disc desiccation with mild disc bulge. Superimposed right foraminal to extraforaminal disc protrusion closely approximates the exiting right L4 nerve root (series 16, image 17). Mild moderate bilateral facet hypertrophy. No significant spinal stenosis. No more than mild bilateral foraminal narrowing. L5-S1: Disc desiccation with diffuse disc bulge. Mild reactive endplate spurring, worse on the right. Moderate facet and ligament flavum hypertrophy. Resultant mild narrowing of the lateral recesses bilaterally. Greater on the right. Central canal remains patent. No significant foraminal stenosis. S1-2: Negative interspace.  Mild facet hypertrophy.  No stenosis. IMPRESSION: 1. No acute abnormality within the lumbar spine. 2. Right foraminal to extraforaminal disc protrusion at L4-5, potentially affecting the exiting right L4 nerve root. 3. Small left foraminal to extraforaminal disc protrusion at L2-3, potentially affecting the exiting left L2 nerve root. 4. Right eccentric disc bulge with facet hypertrophy at L3-4 with resultant mild to moderate right lateral recess stenosis, with moderate right L3 foraminal narrowing. 5. Disc bulge with facet hypertrophy at L5-S1 with resultant mild bilateral lateral recess stenosis. 6. Transitional  lumbosacral anatomy with a lumbarized S1 segment. Careful correlation with numbering system on this exam recommended prior to any potential future intervention. Electronically Signed   By: Morene Hoard M.D.   On: 09/01/2023 03:03   MR BRAIN WO CONTRAST Result Date: 09/01/2023 CLINICAL DATA:  Initial evaluation for acute neuro deficit, stroke suspected. EXAM: MRI HEAD WITHOUT CONTRAST TECHNIQUE: Multiplanar, multiecho pulse sequences of the brain and surrounding structures were obtained without intravenous contrast. COMPARISON:  CT from 08/31/2023 FINDINGS: Brain: Cerebral volume within normal limits. Scattered subcentimeter foci of T2/FLAIR hyperintensity noted involving the periventricular, deep, and subcortical white matter both cerebral hemispheres, nonspecific, but most commonly related to chronic microvascular ischemic disease. Overall, changes are mild in nature. No evidence for acute or subacute infarct. No areas of chronic cortical infarction. No acute or chronic intracranial blood products. No mass lesion, midline shift or mass effect no hydrocephalus or extra-axial fluid collection. Pituitary gland within normal limits. Vascular: Major intracranial vascular flow voids are maintained. Skull and upper cervical spine: Cranial  junction with normal limits. Bone marrow signal intensity normal. No scalp soft tissue abnormality. Sinuses/Orbits: Prior bilateral ocular lens replacement. Small left maxillary sinus retention cyst. Paranasal sinuses are otherwise largely clear. No significant mastoid effusion. Other: None. IMPRESSION: 1. No acute intracranial abnormality. 2. Mild cerebral white matter disease, nonspecific, but most commonly related to chronic microvascular ischemic disease. Electronically Signed   By: Morene Hoard M.D.   On: 09/01/2023 02:56   US  Abdomen Limited RUQ (LIVER/GB) Result Date: 08/31/2023 CLINICAL DATA:  Pain EXAM: ULTRASOUND ABDOMEN LIMITED RIGHT UPPER QUADRANT  COMPARISON:  CT abdomen and pelvis 08/31/2023. FINDINGS: Gallbladder: Multiple gallstones are present measuring up to 1.8 cm. There is no pericholecystic fluid or gallbladder wall thickening. No sonographic Murphy sign noted by sonographer. Common bile duct: Diameter: 4.2 mm Liver: No focal lesion identified. Within normal limits in parenchymal echogenicity. Portal vein is patent on color Doppler imaging with normal direction of blood flow towards the liver. Other: None. IMPRESSION: Cholelithiasis without sonographic evidence of acute cholecystitis. Electronically Signed   By: Greig Pique M.D.   On: 08/31/2023 23:52   CT Angio Chest PE W and/or Wo Contrast Result Date: 08/31/2023 CLINICAL DATA:  Probability pulmonary embolism EXAM: CT ANGIOGRAPHY CHEST WITH CONTRAST TECHNIQUE: Multidetector CT imaging of the chest was performed using the standard protocol during bolus administration of intravenous contrast. Multiplanar CT image reconstructions and MIPs were obtained to evaluate the vascular anatomy. RADIATION DOSE REDUCTION: This exam was performed according to the departmental dose-optimization program which includes automated exposure control, adjustment of the mA and/or kV according to patient size and/or use of iterative reconstruction technique. CONTRAST:  75mL OMNIPAQUE  IOHEXOL  350 MG/ML SOLN COMPARISON:  None Available. FINDINGS: Cardiovascular: There is adequate opacification of the pulmonary arterial tree. No intraluminal filling defect identified to suggest acute pulmonary embolism. The central pulmonary arteries are enlarged in keeping with changes of pulmonary arterial hypertension. Coronary artery bypass grafting has been performed. Mild global cardiomegaly. No pericardial effusion. No significant atherosclerotic calcification within the thoracic aorta. No aortic aneurysm. Mediastinum/Nodes: No enlarged mediastinal, hilar, or axillary lymph nodes. Thyroid  gland, trachea, and esophagus demonstrate  no significant findings. Lungs/Pleura: Moderate right pleural effusion with associated right basilar dependent atelectasis. No superimposed confluent pulmonary infiltrate. No pneumothorax. No pleural effusion on the left. No central obstructing lesion. Upper Abdomen: No acute abnormality. Musculoskeletal: No chest wall abnormality. No acute or significant osseous findings. Review of the MIP images confirms the above findings. IMPRESSION: 1. No pulmonary embolism. 2. Morphologic changes in keeping with pulmonary arterial hypertension. 3. Mild global cardiomegaly. Status post coronary artery bypass grafting. 4. Moderate right pleural effusion with associated right basilar dependent atelectasis. Electronically Signed   By: Dorethia Molt M.D.   On: 08/31/2023 22:04   CT ABDOMEN PELVIS W CONTRAST Result Date: 08/31/2023 CLINICAL DATA:  Acute abdominal pain EXAM: CT ABDOMEN AND PELVIS WITH CONTRAST TECHNIQUE: Multidetector CT imaging of the abdomen and pelvis was performed using the standard protocol following bolus administration of intravenous contrast. RADIATION DOSE REDUCTION: This exam was performed according to the departmental dose-optimization program which includes automated exposure control, adjustment of the mA and/or kV according to patient size and/or use of iterative reconstruction technique. CONTRAST:  75mL OMNIPAQUE  IOHEXOL  350 MG/ML SOLN COMPARISON:  None Available. FINDINGS: Hepatobiliary: Gallbladder is well distended with multiple dependent gallstones. The liver is within normal limits. Pancreas: Unremarkable. No pancreatic ductal dilatation or surrounding inflammatory changes. Spleen: Normal in size without focal abnormality. Adrenals/Urinary Tract: Adrenal glands are within  normal limits bilaterally. Kidneys demonstrate a normal enhancement pattern bilaterally. Few small cysts are noted stable from the prior exam. No follow-up is recommended. No obstructive changes are seen. The bladder is  partially distended. Stomach/Bowel: Colon is distended with air although no obstructive or inflammatory changes are seen. Mild scattered fecal material is noted. The appendix is not well visualized. No inflammatory changes to suggest appendicitis are noted. Small bowel and stomach are within normal limits. Vascular/Lymphatic: Aortic atherosclerosis. No enlarged abdominal or pelvic lymph nodes. Reproductive: Prostate is unremarkable. Other: No abdominal wall hernia or abnormality. No abdominopelvic ascites. Musculoskeletal: No acute or significant osseous findings. IMPRESSION: Cholelithiasis without complicating factors. No other focal abnormality is seen. Electronically Signed   By: Oneil Devonshire M.D.   On: 08/31/2023 21:54   CT L-SPINE NO CHARGE Result Date: 08/31/2023 CLINICAL DATA:  Low back pain EXAM: CT LUMBAR SPINE WITHOUT CONTRAST TECHNIQUE: Multidetector CT imaging of the lumbar spine was performed without intravenous contrast administration. Multiplanar CT image reconstructions were also generated. RADIATION DOSE REDUCTION: This exam was performed according to the departmental dose-optimization program which includes automated exposure control, adjustment of the mA and/or kV according to patient size and/or use of iterative reconstruction technique. COMPARISON:  None Available. FINDINGS: Segmentation: Transitional lumbar anatomy with a lumbarized S1 present and a relatively well developed disc space at S1-2. Alignment: 2-3 mm anterolisthesis L5-S1. Vertebrae: No acute fracture or focal pathologic process. Paraspinal and other soft tissues: See report for accompanying CT examination of the abdomen and pelvis. No paraspinal inflammatory change or fluid collection identified. Disc levels: Intervertebral disc heights are preserved. Spinal canal is widely patent. Osteophytic spurring results in mild bilateral neuroforaminal narrowing at L4-5 and on the right at L5-S1. Remaining neural foramina are widely  patent. Mild to moderate facet arthrosis at L5-S1, left greater than right. IMPRESSION: 1. No acute fracture or listhesis of the lumbar spine. 2. Transitional lumbar anatomy with a lumbarized S1 present and a relatively well developed disc space at S1-2. 3. Mild to moderate facet arthrosis at L5-S1, left greater than right. 4. Mild bilateral neuroforaminal narrowing at L4-5 and on the right at L5-S1. Electronically Signed   By: Dorethia Molt M.D.   On: 08/31/2023 21:53   CT Head Wo Contrast Result Date: 08/31/2023 CLINICAL DATA:  Altered mental status EXAM: CT HEAD WITHOUT CONTRAST TECHNIQUE: Contiguous axial images were obtained from the base of the skull through the vertex without intravenous contrast. RADIATION DOSE REDUCTION: This exam was performed according to the departmental dose-optimization program which includes automated exposure control, adjustment of the mA and/or kV according to patient size and/or use of iterative reconstruction technique. COMPARISON:  None Available. FINDINGS: Brain: No evidence of acute infarction, hemorrhage, hydrocephalus, extra-axial collection or mass lesion/mass effect. Vascular: No hyperdense vessel or unexpected calcification. Skull: Normal. Negative for fracture or focal lesion. Sinuses/Orbits: No acute finding. Other: Mastoid air cells and middle ear cavities are clear. IMPRESSION: 1. No acute intracranial abnormality. Electronically Signed   By: Dorethia Molt M.D.   On: 08/31/2023 21:47   DG Chest Portable 1 View Result Date: 08/31/2023 CLINICAL DATA:  Shortness of breath EXAM: PORTABLE CHEST 1 VIEW COMPARISON:  01/14/2022 FINDINGS: Cardiac shadow is enlarged. Postsurgical changes are again seen. The overall inspiratory effort is poor. Small right pleural effusion is noted with basilar atelectasis. No bony abnormality is noted. IMPRESSION: Right basilar atelectasis and small effusion. Electronically Signed   By: Oneil Devonshire M.D.   On: 08/31/2023 19:04  DG  Lumbar Spine 2-3 Views Result Date: 08/28/2023 CLINICAL DATA:  Low back pain. EXAM: LUMBAR SPINE - 2-3 VIEW COMPARISON:  Lumbar radiograph 01/21/2022 FINDINGS: There are 6 non-rib-bearing lumbar vertebra, the lower most lumbar vertebra is hemi transitional with enlarged left transverse process and pseudoarticulation with the sacrum. The lower most lumbar vertebra will be labeled L5 by convention. Broad-based levo scoliotic curvature. Grade 1 anterolisthesis of L4 on L5. No evidence of fracture or compression deformity. Mild diffuse disc space narrowing and spurring. Moderate L4-L5 and mild L5-S1 facet hypertrophy. IMPRESSION: 1. Mild diffuse degenerative disc disease. Moderate L4-L5 and mild L5-S1 facet hypertrophy. 2. Grade 1 anterolisthesis of L4 on L5. 3. Transitional lumbosacral anatomy. Electronically Signed   By: Andrea Gasman M.D.   On: 08/28/2023 14:37   DG Pelvis 1-2 Views Result Date: 08/28/2023 CLINICAL DATA:  Low back and hip pain. EXAM: PELVIS - 1-2 VIEW COMPARISON:  None Available. FINDINGS: The hip joint spaces are preserved. Femoral heads are well seated. No fracture. No evidence of erosion or focal bone abnormality. No visualized avascular necrosis. The pubic symphysis and sacroiliac joints are congruent. Pelvic phleboliths and vascular calcifications. IMPRESSION: Negative radiograph of the pelvis. Electronically Signed   By: Andrea Gasman M.D.   On: 08/28/2023 14:35     Assessment and Plan:  ODELL CHOUNG is a 73 y.o. male with a hx of hypertension, hyperlipidemia, carotid artery stenosis s/p endarterectomy in 2018, prior TIA, prior stroke, Crohn's disease, ulcerative colitis, and multivessel CAD s/p CABG 2005 and PCI to SVG to [place] in 2008 who is being seen 09/01/2023 for the evaluation of new onset atrial fibrillation at the request of CHRISTOBAL Guadalajara MD.  New onset atrial fibrillation Dizziness CHA2DS2-VASc Score = 5 [CHF History: 0, HTN History: 1, Diabetes History: 0, Stroke  History: 2, Vascular Disease History: 1, Age Score: 1, Gender Score: 0].  Therefore, the patient's annual risk of stroke is 7.2 %.    It is difficult to assess what symptoms the patient has from the atrial fibrillation because of how severe his back pain is. Well being transferred by EMS patient was found to be in A-fib with RVR.  EKG yesterday showed atrial fibrillation with a rate of 120 and a wandering baseline in multiple leads.  He was continued on home 12.5 mg metoprolol  daily but was started on Cardizem  bolus and IV infusion.  Today was started on IV heparin .  Blood pressure stable most recent BP 105/85. Labs showed potassium of 4.1, magnesium  of 2.0, and creatinine of 1.03.  Patient has a elevated CHADS2 Vasc score of 5 but has also had multiple falls in the last 6 months.  Due to these multiple falls and patient's primary concern being his back pain will plan for rate control strategy for now.  If is on DOAC for 3 weeks and remains in A-fib may consider DCCV at that point. Patient snores throughout the night and has daytime somnolence. Recommend outpatient sleep study. TSH and echocardiogram pending. Continue IV Cardizem .  Plan to transition to p.o. after not under consideration for surgical procedure. Continue IV heparin . Continue 12.5 mg of metoprolol  succinate daily.  Had prior bradycardia with metoprolol  so we will avoid increasing this.   Multiple falls Dizziness Has had 3 falls in the last 6 months.  1 of those falls was related to drinking poison and having a seizure.  The most recent fall happened when the patient was try to get up off of the side of  the bed to go to the bathroom. It appears like the most recent fall was likely related to the patient's back pain limiting his mobility.  Has had dizziness while walking and when standing up. Dizziness may be related to atrial fibrillation but will order orthostatic vital signs.  Also had a fall in the yard about 1 to 3 months  ago.   Acute on chronic back pain Patient presented to the emergency department because of severe back pain and flank pain. Is being seen by the surgical team.    Multivessel CAD s/p CABG 2005 and PCI to SVG to [place] in 2008 Hyperlipidemia High-sensitivity troponins negative. Continue aspirin  81 mg daily. Continue atorvastatin  80 mg daily.   Otherwise manage per primary  Risk Assessment/Risk Scores:       CHA2DS2-VASc Score = 5  {1This indicates a 7.2% annual risk of stroke. The patient's score is based upon: CHF History: 0 HTN History: 1 Diabetes History: 0 Stroke History: 2 Vascular Disease History: 1 Age Score: 1 Gender Score: 0        For questions or updates, please contact [facility] Please consult [email or web address] for contact info under    Signed, Morse Clause, PA-C  09/01/2023 12:04 PM

## 2023-09-02 ENCOUNTER — Inpatient Hospital Stay (HOSPITAL_COMMUNITY)

## 2023-09-02 ENCOUNTER — Telehealth (HOSPITAL_COMMUNITY): Payer: Self-pay | Admitting: Pharmacy Technician

## 2023-09-02 ENCOUNTER — Other Ambulatory Visit (HOSPITAL_COMMUNITY): Payer: Self-pay

## 2023-09-02 DIAGNOSIS — I4891 Unspecified atrial fibrillation: Secondary | ICD-10-CM | POA: Diagnosis not present

## 2023-09-02 DIAGNOSIS — R296 Repeated falls: Secondary | ICD-10-CM | POA: Diagnosis not present

## 2023-09-02 DIAGNOSIS — R0781 Pleurodynia: Secondary | ICD-10-CM

## 2023-09-02 DIAGNOSIS — I5031 Acute diastolic (congestive) heart failure: Secondary | ICD-10-CM | POA: Diagnosis not present

## 2023-09-02 HISTORY — PX: IR THORACENTESIS ASP PLEURAL SPACE W/IMG GUIDE: IMG5380

## 2023-09-02 LAB — CBC
HCT: 30.9 % — ABNORMAL LOW (ref 39.0–52.0)
Hemoglobin: 10.1 g/dL — ABNORMAL LOW (ref 13.0–17.0)
MCH: 29.2 pg (ref 26.0–34.0)
MCHC: 32.7 g/dL (ref 30.0–36.0)
MCV: 89.3 fL (ref 80.0–100.0)
Platelets: 214 K/uL (ref 150–400)
RBC: 3.46 MIL/uL — ABNORMAL LOW (ref 4.22–5.81)
RDW: 13.2 % (ref 11.5–15.5)
WBC: 8.9 K/uL (ref 4.0–10.5)
nRBC: 0 % (ref 0.0–0.2)

## 2023-09-02 LAB — LACTATE DEHYDROGENASE, PLEURAL OR PERITONEAL FLUID: LD, Fluid: 362 U/L — ABNORMAL HIGH (ref 3–23)

## 2023-09-02 LAB — BODY FLUID CELL COUNT WITH DIFFERENTIAL
Eos, Fluid: 1 %
Lymphs, Fluid: 46 %
Monocyte-Macrophage-Serous Fluid: 1 % — ABNORMAL LOW (ref 50–90)
Neutrophil Count, Fluid: 52 % — ABNORMAL HIGH (ref 0–25)
Total Nucleated Cell Count, Fluid: 2840 uL — ABNORMAL HIGH (ref 0–1000)

## 2023-09-02 LAB — PROTEIN, PLEURAL OR PERITONEAL FLUID: Total protein, fluid: 4.3 g/dL

## 2023-09-02 LAB — BASIC METABOLIC PANEL WITH GFR
Anion gap: 11 (ref 5–15)
BUN: 31 mg/dL — ABNORMAL HIGH (ref 8–23)
CO2: 21 mmol/L — ABNORMAL LOW (ref 22–32)
Calcium: 7.9 mg/dL — ABNORMAL LOW (ref 8.9–10.3)
Chloride: 99 mmol/L (ref 98–111)
Creatinine, Ser: 2.03 mg/dL — ABNORMAL HIGH (ref 0.61–1.24)
GFR, Estimated: 34 mL/min — ABNORMAL LOW (ref >60)
Glucose, Bld: 124 mg/dL — ABNORMAL HIGH (ref 70–99)
Potassium: 3.6 mmol/L (ref 3.5–5.1)
Sodium: 131 mmol/L — ABNORMAL LOW (ref 135–145)

## 2023-09-02 LAB — GRAM STAIN

## 2023-09-02 LAB — PROTEIN, TOTAL: Total Protein: 5.8 g/dL — ABNORMAL LOW (ref 6.5–8.1)

## 2023-09-02 LAB — LACTIC ACID, PLASMA: Lactic Acid, Venous: 1 mmol/L (ref 0.5–1.9)

## 2023-09-02 LAB — ALBUMIN: Albumin: 2.2 g/dL — ABNORMAL LOW (ref 3.5–5.0)

## 2023-09-02 LAB — HEPARIN LEVEL (UNFRACTIONATED): Heparin Unfractionated: 0.14 [IU]/mL — ABNORMAL LOW (ref 0.30–0.70)

## 2023-09-02 MED ORDER — AMIODARONE HCL IN DEXTROSE 360-4.14 MG/200ML-% IV SOLN
60.0000 mg/h | INTRAVENOUS | Status: DC
  Administered 2023-09-02: 60 mg/h via INTRAVENOUS
  Filled 2023-09-02: qty 200

## 2023-09-02 MED ORDER — LIDOCAINE 5 % EX PTCH
1.0000 | MEDICATED_PATCH | CUTANEOUS | Status: DC
  Administered 2023-09-02 – 2023-09-09 (×8): 1 via TRANSDERMAL
  Filled 2023-09-02 (×8): qty 1

## 2023-09-02 MED ORDER — LIDOCAINE 5 % EX PTCH
1.0000 | MEDICATED_PATCH | CUTANEOUS | Status: DC
  Administered 2023-09-03 – 2023-09-09 (×7): 1 via TRANSDERMAL
  Filled 2023-09-02 (×7): qty 1

## 2023-09-02 MED ORDER — MIDODRINE HCL 5 MG PO TABS
5.0000 mg | ORAL_TABLET | Freq: Once | ORAL | Status: AC
  Administered 2023-09-02: 5 mg via ORAL
  Filled 2023-09-02: qty 1

## 2023-09-02 MED ORDER — AMIODARONE HCL IN DEXTROSE 360-4.14 MG/200ML-% IV SOLN
30.0000 mg/h | INTRAVENOUS | Status: DC
  Administered 2023-09-02 – 2023-09-05 (×6): 30 mg/h via INTRAVENOUS
  Filled 2023-09-02 (×6): qty 200

## 2023-09-02 MED ORDER — POLYETHYLENE GLYCOL 3350 17 G PO PACK
17.0000 g | PACK | Freq: Every day | ORAL | Status: DC
  Administered 2023-09-02 – 2023-09-03 (×2): 17 g via ORAL
  Filled 2023-09-02 (×3): qty 1

## 2023-09-02 MED ORDER — ENSURE PLUS HIGH PROTEIN PO LIQD
237.0000 mL | Freq: Two times a day (BID) | ORAL | Status: DC
  Administered 2023-09-04 – 2023-09-05 (×3): 237 mL via ORAL

## 2023-09-02 MED ORDER — METHOCARBAMOL 1000 MG/10ML IJ SOLN
750.0000 mg | Freq: Three times a day (TID) | INTRAMUSCULAR | Status: DC | PRN
  Administered 2023-09-03: 750 mg via INTRAVENOUS
  Filled 2023-09-02: qty 10

## 2023-09-02 MED ORDER — AMIODARONE HCL IN DEXTROSE 360-4.14 MG/200ML-% IV SOLN
INTRAVENOUS | Status: AC
  Administered 2023-09-02: 60 mg/h via INTRAVENOUS
  Filled 2023-09-02: qty 200

## 2023-09-02 MED ORDER — AMIODARONE LOAD VIA INFUSION
150.0000 mg | Freq: Once | INTRAVENOUS | Status: AC
  Administered 2023-09-02: 150 mg via INTRAVENOUS
  Filled 2023-09-02: qty 83.34

## 2023-09-02 MED ORDER — LIDOCAINE-EPINEPHRINE 1 %-1:100000 IJ SOLN
INTRAMUSCULAR | Status: AC
  Filled 2023-09-02: qty 1

## 2023-09-02 NOTE — Procedures (Signed)
 PROCEDURE SUMMARY:  Successful image-guided diagnostic and therapeutic thoracentesis from the right chest.  Yielded 350 milliliters of amber fluid.  No immediate complications.  EBL: zero Patient tolerated well.   Specimen sent for labs.  Post-procedure CXR ordered and reviewed prior to departure from department.   Please see imaging section of Epic for full dictation.  Maylea Soria NP 09/02/2023 12:54 PM

## 2023-09-02 NOTE — Progress Notes (Signed)
Heart rate too high for accurate echo at this time. 

## 2023-09-02 NOTE — Progress Notes (Signed)
 8/13 @ 1947 Cardizem  gtt @ 12.5, pt's BP 82/53 MAP 77. Notified Segars, MD & [place], MD. Cardizem  gtt titrated to 10 mg/hr, then to 5 mg/hr. 12-lead EKG obtained. Verbal order to maintain MAP above 65.  8/13 @ 2307 BO 77/51 MAP 67 HR 69, Cardizem  gtt discontinued. Notified Segars, MD. 250 mL Bolus ordered & administered.    8/14 @ 0131 pt's BP 70/47 MAP 62 HR 66, notified Segars, MD. One-time dose of Midodrine  ordered.  Lonell LITTIE Lyme, RN

## 2023-09-02 NOTE — Progress Notes (Signed)
 PHARMACY - ANTICOAGULATION CONSULT NOTE  Pharmacy Consult for heparin   Indication: atrial fibrillation  No Known Allergies  Patient Measurements: Height: 5' 11 (180.3 cm) Weight: 95.4 kg (210 lb 5.1 oz) IBW/kg (Calculated) : 75.3 HEPARIN  DW (KG): 94.5  Vital Signs: Temp: 98.1 F (36.7 C) (08/14 1525) Temp Source: Oral (08/14 1525) BP: 123/66 (08/14 1525) Pulse Rate: 100 (08/14 1525)  Labs: Recent Labs    08/31/23 1856 08/31/23 2012 09/01/23 0450 09/01/23 1337 09/02/23 0324 09/02/23 1605  HGB 11.3*  --  11.2*  --  10.1*  --   HCT 34.3*  --  34.0*  --  30.9*  --   PLT 267  --  260  --  214  --   HEPARINUNFRC  --   --   --  0.37  --  0.14*  CREATININE 1.15  --  1.03  --  2.03*  --   TROPONINIHS 5 4  --   --   --   --     Estimated Creatinine Clearance: 39.3 mL/min (A) (by C-G formula based on SCr of 2.03 mg/dL (H)).   Assessment: 36 yoM presented to ED with abdominal/back pain and found to be in afib RVR. Pharmacy consulted to dose heparin  for afib. -No hx of afib or DOAC use  Heparin  level down to 0.14 (subtherapeutic) on 1400 units/hr. Of note, heparin  was held briefly during procedure ~1230 so may be falsely low. No bleeding noted.   Goal of Therapy:  Heparin  level 0.3-0.7 units/ml Monitor platelets by anticoagulation protocol: Yes   Plan:  Increase heparin  drip slightly to 1550 uts/hr F/u 8 hr heparin  level  Vito Ralph, PharmD, BCPS Please see amion for complete clinical pharmacist phone list 09/02/2023 4:46 PM

## 2023-09-02 NOTE — Progress Notes (Addendum)
 PROGRESS NOTE    Jacob [name]  FMW:[id number] DOB: 27-Mar-1950 DOA: 08/31/2023 PCP: [name], MD   Brief Narrative: Jacob [name] is a 73 y.o. male with a history of CAD s/p CABG, hyperlipidemia, hypertension, Crohn's disease, chronic low back pain.  Patient presented secondary to worsening back pain and right-sided chest pain. During hospitalization, patient developed atrial fibrillation with RVR, which was complicated by hypotension on diltiazem  drip. Patient's pain has been difficult to control.   Assessment and Plan:  Acute on chronic low back pain History of multiple falls. MRI obtained and was significant for multilevel degenerative disc disease with foraminal/extraforaminal disc protrusions with resultant possible nerve root impingement; also with multilevel spinal stenosis and transitional lumbosacral anatomy. Neurosurgery was consulted and recommended no inpatient management and for outpatient pain management referral. Patient states they will follow-up with Dr. Louis, neurosurgery, as an outpatient. -Continue PT/OT  Right sided pleuritic chest pain Point tenderness around the sixth rib. No associated known falls. CT imaging without evidence of fracture. Pleural effusion noted on that side, however, so injury could still be a possibility. Pain does not wrap around chest, so unlikely nerve related. Patient does have cholelithiasis, but I don't believe referred pain would manifest at his current area of pain. -Continue analgesics -Follow-up post-thoracentesis  Right moderate pleural effusion Unclear etiology. Patient with a history of multiple falls so possibly inflammatory and related to injury. No symptoms concerning for infection. -Thoracentesis with labs/culture  Atrial fibrillation with RVR New diagnosis. Cardiology consulted. Patient started on diltiazem  IV but developed subsequent hypotension. Patient with persistent RVR at this time. Heparin  IV  started -Cardiology recommendations: amiodarone , heparin  IV  AKI Likely ATN related to hypotension from diltiazem  IV drip. -IV fluids -BMP in AM  Carotid artery stenosis Noted. Patient is s/p history of endarterectomy. -Continue aspirin  -Continue Lipitor  History of crohn's disease Noted.  Asymptomatic cholelithiasis Noted on ultrasound.   CAD No evidence of angina. Patient was on metoprolol  which is held. -Continue aspirin  and Lipitor  Primary hypertension Patient was managed on lisinopril  and amlodipine  which were discontinued after starting Cardizem .  Normocytic anemia Baseline hemoglobin appears to be around 11-12. Currently stable.   DVT prophylaxis: Heparin  IV Code Status:   Code Status: Full Code Family Communication: Wife at bedside Disposition Plan: Discharge pending improvement of pain and ongoing cardiology recommendations   Consultants:  Cardiology Neurosurgery  Procedures:  Transthoracic Echocardiogram  Antimicrobials: None    Subjective: Patient reports ongoing right sided chest pain.  Objective: BP 121/75   Pulse (!) 113   Temp 98.6 F (37 C) (Oral)   Resp 15   Ht 5' 11 (1.803 m)   Wt 95.4 kg   SpO2 94%   BMI 29.33 kg/m   Examination:  General exam: Appears calm and comfortable Respiratory system: Clear to auscultation. Respiratory effort normal. Cardiovascular system: S1 & S2 heard, fast rate, irregular rhythm. No murmurs. Gastrointestinal system: Abdomen is nondistended, soft and nontender. Normal bowel sounds heard. Central nervous system: Alert and oriented. No focal neurological deficits. Musculoskeletal: No edema. No calf tenderness. Point tenderness over right chest around 6th rib Psychiatry: Judgement and insight appear normal. Mood & affect appropriate.    Data Reviewed: I have personally reviewed following labs and imaging studies  CBC Lab Results  Component Value Date   WBC 8.9 09/02/2023   RBC 3.46 (L)  09/02/2023   HGB 10.1 (L) 09/02/2023   HCT 30.9 (L) 09/02/2023   MCV 89.3 09/02/2023  MCH 29.2 09/02/2023   PLT 214 09/02/2023   MCHC 32.7 09/02/2023   RDW 13.2 09/02/2023   LYMPHSABS 1.3 08/31/2023   MONOABS 0.9 08/31/2023   EOSABS 0.2 08/31/2023   BASOSABS 0.0 08/31/2023     Last metabolic panel Lab Results  Component Value Date   NA 131 (L) 09/02/2023   K 3.6 09/02/2023   CL 99 09/02/2023   CO2 21 (L) 09/02/2023   BUN 31 (H) 09/02/2023   CREATININE 2.03 (H) 09/02/2023   GLUCOSE 124 (H) 09/02/2023   GFRNONAA 34 (L) 09/02/2023   GFRAA 66 06/07/2019   CALCIUM  7.9 (L) 09/02/2023   PROT 5.8 (L) 09/02/2023   ALBUMIN 2.2 (L) 09/02/2023   LABGLOB 2.8 06/07/2019   AGRATIO 1.6 06/07/2019   BILITOT 0.5 08/31/2023   ALKPHOS 71 08/31/2023   AST 23 08/31/2023   ALT 23 08/31/2023   ANIONGAP 11 09/02/2023    GFR: Estimated Creatinine Clearance: 39.3 mL/min (A) (by C-G formula based on SCr of 2.03 mg/dL (H)).  No results found for this or any previous visit (from the past 240 hours).    Radiology Studies: IR THORACENTESIS ASP PLEURAL SPACE W/IMG GUIDE Result Date: 09/02/2023 INDICATION: Inpatient with new onset Afib with RVR on amiodarone  and acute CHF. Right pleural effusion noted on recent imaging, request for diagnostic and therapeutic thoracentesis. EXAM: ULTRASOUND GUIDED RIGHT THORACENTESIS MEDICATIONS: 10 mL 1% lidocaine  COMPLICATIONS: None immediate. PROCEDURE: An ultrasound guided thoracentesis was thoroughly discussed with the patient and questions answered. The benefits, risks, alternatives and complications were also discussed. The patient understands and wishes to proceed with the procedure. Written consent was obtained. Ultrasound was performed to localize and mark an adequate pocket of fluid in the right chest. The area was then prepped and draped in the normal sterile fashion. 1% Lidocaine  was used for local anesthesia. Under ultrasound guidance a 6 Fr  Safe-T-Centesis catheter was introduced. Thoracentesis was performed. Patient reported back spasm during exam and requested exam be ended due to pain. The catheter was removed and a dressing applied. CXR ordered post-procedure. FINDINGS: A total of approximately 350 milliliters of amber fluid was removed. Samples were sent to the laboratory as requested by the clinical team. IMPRESSION: Successful ultrasound guided right thoracentesis yielding 350 milliliters of pleural fluid. Performed and dictated by Laymon Coast, NP Electronically Signed   By: Ester Sides M.D.   On: 09/02/2023 13:07   DG Chest Port 1 View Result Date: 09/02/2023 CLINICAL DATA:  Pleural effusion.  Status post right thoracentesis. EXAM: PORTABLE CHEST 1 VIEW COMPARISON:  08/31/2023 FINDINGS: Low volume film. No evidence for pneumothorax. Decrease in right pleural effusion since prior study with persistent bibasilar atelectasis. Gaseous distention of the stomach evident under the left hemidiaphragm. Nodular density noted left lung base with no suspicious pulmonary nodule or mass in this region on CT scan 2 days ago. The cardio pericardial silhouette is enlarged. IMPRESSION: 1. No evidence for pneumothorax after right thoracentesis. 2. Decrease in right pleural effusion with persistent bibasilar atelectasis. Electronically Signed   By: Camellia Candle M.D.   On: 09/02/2023 12:53   MR LUMBAR SPINE WO CONTRAST Result Date: 09/01/2023 CLINICAL DATA:  Initial evaluation for acute lumbar radiculopathy. EXAM: MRI LUMBAR SPINE WITHOUT CONTRAST TECHNIQUE: Multiplanar, multisequence MR imaging of the lumbar spine was performed. No intravenous contrast was administered. COMPARISON:  CT from 08/31/2023. FINDINGS: Segmentation: Transitional features about the lumbosacral junction with a lumbarized S1 segment and rudimentary S1-2 interspace. Alignment: Mild exaggeration of the normal  lumbar lordosis. Trace degenerative retrolisthesis of L1 on L2.  Vertebrae: Vertebral body height maintained without acute or chronic fracture. Bone marrow signal intensity within normal limits. No discrete or worrisome osseous lesions or abnormal marrow edema. Conus medullaris and cauda equina: Conus extends to the T12-L1 level. Conus and cauda equina appear normal. Paraspinal and other soft tissues: Unremarkable. Disc levels: L1-2: Disc desiccation with diffuse disc bulge, slightly asymmetric to the left. Minimal reactive endplate spurring. No spinal stenosis. Foramina remain patent. L2-3: Disc desiccation. Small left foraminal to extraforaminal disc protrusion closely approximates the exiting left L2 nerve root. Mild bilateral facet hypertrophy. No spinal stenosis. Foramina remain patent. L3-4: Mild intervertebral disc space narrowing with diffuse disc bulge and disc desiccation. Disc bulging asymmetric to the right with right-sided reactive endplate spurring. Mild bilateral facet arthrosis. Resultant mild to moderate narrowing of the right lateral recess. Central canal remains patent. Moderate right L3 foraminal stenosis. Left neural foramen remains patent. L4-5: Disc desiccation with mild disc bulge. Superimposed right foraminal to extraforaminal disc protrusion closely approximates the exiting right L4 nerve root (series 16, image 17). Mild moderate bilateral facet hypertrophy. No significant spinal stenosis. No more than mild bilateral foraminal narrowing. L5-S1: Disc desiccation with diffuse disc bulge. Mild reactive endplate spurring, worse on the right. Moderate facet and ligament flavum hypertrophy. Resultant mild narrowing of the lateral recesses bilaterally. Greater on the right. Central canal remains patent. No significant foraminal stenosis. S1-2: Negative interspace.  Mild facet hypertrophy.  No stenosis. IMPRESSION: 1. No acute abnormality within the lumbar spine. 2. Right foraminal to extraforaminal disc protrusion at L4-5, potentially affecting the exiting  right L4 nerve root. 3. Small left foraminal to extraforaminal disc protrusion at L2-3, potentially affecting the exiting left L2 nerve root. 4. Right eccentric disc bulge with facet hypertrophy at L3-4 with resultant mild to moderate right lateral recess stenosis, with moderate right L3 foraminal narrowing. 5. Disc bulge with facet hypertrophy at L5-S1 with resultant mild bilateral lateral recess stenosis. 6. Transitional lumbosacral anatomy with a lumbarized S1 segment. Careful correlation with numbering system on this exam recommended prior to any potential future intervention. Electronically Signed   By: Morene Hoard M.D.   On: 09/01/2023 03:03   MR BRAIN WO CONTRAST Result Date: 09/01/2023 CLINICAL DATA:  Initial evaluation for acute neuro deficit, stroke suspected. EXAM: MRI HEAD WITHOUT CONTRAST TECHNIQUE: Multiplanar, multiecho pulse sequences of the brain and surrounding structures were obtained without intravenous contrast. COMPARISON:  CT from 08/31/2023 FINDINGS: Brain: Cerebral volume within normal limits. Scattered subcentimeter foci of T2/FLAIR hyperintensity noted involving the periventricular, deep, and subcortical white matter both cerebral hemispheres, nonspecific, but most commonly related to chronic microvascular ischemic disease. Overall, changes are mild in nature. No evidence for acute or subacute infarct. No areas of chronic cortical infarction. No acute or chronic intracranial blood products. No mass lesion, midline shift or mass effect no hydrocephalus or extra-axial fluid collection. Pituitary gland within normal limits. Vascular: Major intracranial vascular flow voids are maintained. Skull and upper cervical spine: Cranial junction with normal limits. Bone marrow signal intensity normal. No scalp soft tissue abnormality. Sinuses/Orbits: Prior bilateral ocular lens replacement. Small left maxillary sinus retention cyst. Paranasal sinuses are otherwise largely clear. No  significant mastoid effusion. Other: None. IMPRESSION: 1. No acute intracranial abnormality. 2. Mild cerebral white matter disease, nonspecific, but most commonly related to chronic microvascular ischemic disease. Electronically Signed   By: Morene Hoard M.D.   On: 09/01/2023 02:56   US  Abdomen Limited  RUQ (LIVER/GB) Result Date: 08/31/2023 CLINICAL DATA:  Pain EXAM: ULTRASOUND ABDOMEN LIMITED RIGHT UPPER QUADRANT COMPARISON:  CT abdomen and pelvis 08/31/2023. FINDINGS: Gallbladder: Multiple gallstones are present measuring up to 1.8 cm. There is no pericholecystic fluid or gallbladder wall thickening. No sonographic Murphy sign noted by sonographer. Common bile duct: Diameter: 4.2 mm Liver: No focal lesion identified. Within normal limits in parenchymal echogenicity. Portal vein is patent on color Doppler imaging with normal direction of blood flow towards the liver. Other: None. IMPRESSION: Cholelithiasis without sonographic evidence of acute cholecystitis. Electronically Signed   By: Greig Pique M.D.   On: 08/31/2023 23:52   CT Angio Chest PE W and/or Wo Contrast Result Date: 08/31/2023 CLINICAL DATA:  Probability pulmonary embolism EXAM: CT ANGIOGRAPHY CHEST WITH CONTRAST TECHNIQUE: Multidetector CT imaging of the chest was performed using the standard protocol during bolus administration of intravenous contrast. Multiplanar CT image reconstructions and MIPs were obtained to evaluate the vascular anatomy. RADIATION DOSE REDUCTION: This exam was performed according to the departmental dose-optimization program which includes automated exposure control, adjustment of the mA and/or kV according to patient size and/or use of iterative reconstruction technique. CONTRAST:  75mL OMNIPAQUE  IOHEXOL  350 MG/ML SOLN COMPARISON:  None Available. FINDINGS: Cardiovascular: There is adequate opacification of the pulmonary arterial tree. No intraluminal filling defect identified to suggest acute pulmonary  embolism. The central pulmonary arteries are enlarged in keeping with changes of pulmonary arterial hypertension. Coronary artery bypass grafting has been performed. Mild global cardiomegaly. No pericardial effusion. No significant atherosclerotic calcification within the thoracic aorta. No aortic aneurysm. Mediastinum/Nodes: No enlarged mediastinal, hilar, or axillary lymph nodes. Thyroid  gland, trachea, and esophagus demonstrate no significant findings. Lungs/Pleura: Moderate right pleural effusion with associated right basilar dependent atelectasis. No superimposed confluent pulmonary infiltrate. No pneumothorax. No pleural effusion on the left. No central obstructing lesion. Upper Abdomen: No acute abnormality. Musculoskeletal: No chest wall abnormality. No acute or significant osseous findings. Review of the MIP images confirms the above findings. IMPRESSION: 1. No pulmonary embolism. 2. Morphologic changes in keeping with pulmonary arterial hypertension. 3. Mild global cardiomegaly. Status post coronary artery bypass grafting. 4. Moderate right pleural effusion with associated right basilar dependent atelectasis. Electronically Signed   By: Dorethia Molt M.D.   On: 08/31/2023 22:04   CT ABDOMEN PELVIS W CONTRAST Result Date: 08/31/2023 CLINICAL DATA:  Acute abdominal pain EXAM: CT ABDOMEN AND PELVIS WITH CONTRAST TECHNIQUE: Multidetector CT imaging of the abdomen and pelvis was performed using the standard protocol following bolus administration of intravenous contrast. RADIATION DOSE REDUCTION: This exam was performed according to the departmental dose-optimization program which includes automated exposure control, adjustment of the mA and/or kV according to patient size and/or use of iterative reconstruction technique. CONTRAST:  75mL OMNIPAQUE  IOHEXOL  350 MG/ML SOLN COMPARISON:  None Available. FINDINGS: Hepatobiliary: Gallbladder is well distended with multiple dependent gallstones. The liver is within  normal limits. Pancreas: Unremarkable. No pancreatic ductal dilatation or surrounding inflammatory changes. Spleen: Normal in size without focal abnormality. Adrenals/Urinary Tract: Adrenal glands are within normal limits bilaterally. Kidneys demonstrate a normal enhancement pattern bilaterally. Few small cysts are noted stable from the prior exam. No follow-up is recommended. No obstructive changes are seen. The bladder is partially distended. Stomach/Bowel: Colon is distended with air although no obstructive or inflammatory changes are seen. Mild scattered fecal material is noted. The appendix is not well visualized. No inflammatory changes to suggest appendicitis are noted. Small bowel and stomach are within normal limits. Vascular/Lymphatic: Aortic atherosclerosis.  No enlarged abdominal or pelvic lymph nodes. Reproductive: Prostate is unremarkable. Other: No abdominal wall hernia or abnormality. No abdominopelvic ascites. Musculoskeletal: No acute or significant osseous findings. IMPRESSION: Cholelithiasis without complicating factors. No other focal abnormality is seen. Electronically Signed   By: Oneil Devonshire M.D.   On: 08/31/2023 21:54   CT L-SPINE NO CHARGE Result Date: 08/31/2023 CLINICAL DATA:  Low back pain EXAM: CT LUMBAR SPINE WITHOUT CONTRAST TECHNIQUE: Multidetector CT imaging of the lumbar spine was performed without intravenous contrast administration. Multiplanar CT image reconstructions were also generated. RADIATION DOSE REDUCTION: This exam was performed according to the departmental dose-optimization program which includes automated exposure control, adjustment of the mA and/or kV according to patient size and/or use of iterative reconstruction technique. COMPARISON:  None Available. FINDINGS: Segmentation: Transitional lumbar anatomy with a lumbarized S1 present and a relatively well developed disc space at S1-2. Alignment: 2-3 mm anterolisthesis L5-S1. Vertebrae: No acute fracture or focal  pathologic process. Paraspinal and other soft tissues: See report for accompanying CT examination of the abdomen and pelvis. No paraspinal inflammatory change or fluid collection identified. Disc levels: Intervertebral disc heights are preserved. Spinal canal is widely patent. Osteophytic spurring results in mild bilateral neuroforaminal narrowing at L4-5 and on the right at L5-S1. Remaining neural foramina are widely patent. Mild to moderate facet arthrosis at L5-S1, left greater than right. IMPRESSION: 1. No acute fracture or listhesis of the lumbar spine. 2. Transitional lumbar anatomy with a lumbarized S1 present and a relatively well developed disc space at S1-2. 3. Mild to moderate facet arthrosis at L5-S1, left greater than right. 4. Mild bilateral neuroforaminal narrowing at L4-5 and on the right at L5-S1. Electronically Signed   By: Dorethia Molt M.D.   On: 08/31/2023 21:53   CT Head Wo Contrast Result Date: 08/31/2023 CLINICAL DATA:  Altered mental status EXAM: CT HEAD WITHOUT CONTRAST TECHNIQUE: Contiguous axial images were obtained from the base of the skull through the vertex without intravenous contrast. RADIATION DOSE REDUCTION: This exam was performed according to the departmental dose-optimization program which includes automated exposure control, adjustment of the mA and/or kV according to patient size and/or use of iterative reconstruction technique. COMPARISON:  None Available. FINDINGS: Brain: No evidence of acute infarction, hemorrhage, hydrocephalus, extra-axial collection or mass lesion/mass effect. Vascular: No hyperdense vessel or unexpected calcification. Skull: Normal. Negative for fracture or focal lesion. Sinuses/Orbits: No acute finding. Other: Mastoid air cells and middle ear cavities are clear. IMPRESSION: 1. No acute intracranial abnormality. Electronically Signed   By: Dorethia Molt M.D.   On: 08/31/2023 21:47   DG Chest Portable 1 View Result Date: 08/31/2023 CLINICAL  DATA:  Shortness of breath EXAM: PORTABLE CHEST 1 VIEW COMPARISON:  01/14/2022 FINDINGS: Cardiac shadow is enlarged. Postsurgical changes are again seen. The overall inspiratory effort is poor. Small right pleural effusion is noted with basilar atelectasis. No bony abnormality is noted. IMPRESSION: Right basilar atelectasis and small effusion. Electronically Signed   By: Oneil Devonshire M.D.   On: 08/31/2023 19:04      LOS: 2 days    Elgin Lam, MD [facility] 09/02/2023, 1:41 PM   If 7PM-7AM, please contact night-coverage [email or web address]

## 2023-09-02 NOTE — TOC Initial Note (Signed)
 Transition of Care [facility]) - Initial/Assessment Note    Patient Details  Name: Jacob [name] MRN: [id number] Date of Birth: Nov 20, 1950  Transition of Care [facility]) CM/SW Contact:    Lauraine FORBES Saa, LCSWA Phone Number: 09/02/2023, 5:27 PM  Clinical Narrative:                  5:27 PM CSW informed patient and patient's family of physical therapy recommendation of patient discharging to SNF. Patient and patient's family expressed preference in patient discharging home with home health vs. SNF. Patient's spouse informed CSW that patient's adult son's, one of whom lives with patient and patient's spouse, are able to assist, in addition to other family supports. CSW relayed information to medical team. TOC will continue to follow and be available to assist.  Expected Discharge Plan: Home w Home Health Services Barriers to Discharge: Continued Medical Work up   Patient Goals and CMS Choice Patient states their goals for this hospitalization and ongoing recovery are:: to return home with home health          Expected Discharge Plan and Services   Discharge Planning Services: CM Consult Post Acute Care Choice: Home Health Living arrangements for the past 2 months: Single Family Home                                      Prior Living Arrangements/Services Living arrangements for the past 2 months: Single Family Home Lives with:: Spouse, Adult Children Patient language and need for interpreter reviewed:: Yes        Need for Family Participation in Patient Care: No (Comment) Care giver support system in place?: Yes (comment)   Criminal Activity/Legal Involvement Pertinent to Current Situation/Hospitalization: No - Comment as needed  Activities of Daily Living   ADL Screening (condition at time of admission) Independently performs ADLs?: No Does the patient have a NEW difficulty with bathing/dressing/toileting/self-feeding that is expected to last >3 days?: No Does the  patient have a NEW difficulty with getting in/out of bed, walking, or climbing stairs that is expected to last >3 days?: Yes (Initiates electronic notice to provider for possible PT consult) Does the patient have a NEW difficulty with communication that is expected to last >3 days?: No Is the patient deaf or have difficulty hearing?: No Does the patient have difficulty seeing, even when wearing glasses/contacts?: No Does the patient have difficulty concentrating, remembering, or making decisions?: No  Permission Sought/Granted Permission sought to share information with : Facility Medical sales representative, Family Supports Permission granted to share information with : Yes, Verbal Permission Granted  Share Information with NAME: Seyon Strader  Permission granted to share info w AGENCY: HH  Permission granted to share info w Relationship: Spouse  Permission granted to share info w Contact Information: [phone]  Emotional Assessment Appearance:: Appears stated age Attitude/Demeanor/Rapport: Engaged Affect (typically observed): Appropriate, Accepting, Adaptable, Calm, Stable, Pleasant Orientation: : Oriented to Situation, Oriented to  Time, Oriented to Self, Oriented to Place Alcohol / Substance Use: Not Applicable Psych Involvement: No (comment)  Admission diagnosis:  Dizziness [R42] Rapid atrial fibrillation (HCC) [I48.91] Atrial fibrillation with RVR (HCC) [I48.91] Right upper quadrant abdominal pain [R10.11] Acute low back pain, unspecified back pain laterality, unspecified whether sciatica present [M54.50] Patient Active Problem List   Diagnosis Date Noted   Dizziness 09/01/2023   Frequent falls 09/01/2023   Acute low back pain 09/01/2023   Atrial  fibrillation with RVR (HCC) 08/31/2023   Acute on chronic back pain 08/31/2023   Abdominal pain 08/31/2023   Acute focal neurological deficit 01/14/2022   Crohn's colitis (HCC) 03/10/2018   Scrotal pain 12/07/2017   Carotid  artery stenosis    Confusion    TIA (transient ischemic attack) 06/16/2016   Coronary artery disease due to lipid rich plaque 09/14/2013   Pure hypercholesterolemia 09/14/2013   Essential hypertension, benign 09/14/2013   Bradycardia, sinus 09/14/2013   PCP:  [name], MD Pharmacy:   [facility] [place] [facility], [place] - [address] [place] [address] Phone: [phone] Fax: [phone]     Social Drivers of Health (SDOH) Social History: SDOH Screenings   Food Insecurity: Food Insecurity Present (09/01/2023)  Housing: Low Risk  (09/01/2023)  Transportation Needs: No Transportation Needs (09/01/2023)  Utilities: Not At Risk (09/01/2023)  Social Connections: Socially Integrated (09/01/2023)  Tobacco Use: High Risk (08/31/2023)   SDOH Interventions: Food Insecurity Interventions: [employer] Provided, Inpatient TOC   Readmission Risk Interventions     No data to display

## 2023-09-02 NOTE — Evaluation (Addendum)
 Occupational Therapy Evaluation Patient Details Name: Jacob [name] MRN: [id number] DOB: 1950-07-12 Today's Date: 09/02/2023   History of Present Illness   73 y.o. male admitted 8/12 with A-fib with RVR in the setting of new severe back pain.   MRI shows mild generalized spondylosis, L5-S1 facet arthropathy, and L3-4 degenerative disease. PMH: Crohn's disease, CAD, prior stroke, CABG.     Clinical Impressions This 73 yo male admitted with above presents to acute OT with PLOF over last month requiring more A. Currently he is setup-total A for basic ADLs due to pain in his back. He will continue to benefit from acute OT without need for follow up. Due to the amount of A currently needed would recommended continued inpatient follow up therapy, <3 hours/day to work on therapy and pain control. If pain is better controlled then HHOT would be more appropriate.       If plan is discharge home, recommend the following:   A little help with walking and/or transfers;A lot of help with bathing/dressing/bathroom;Assistance with cooking/housework;Assist for transportation;Help with stairs or ramp for entrance     Functional Status Assessment   Patient has had a recent decline in their functional status and demonstrates the ability to make significant improvements in function in a reasonable and predictable amount of time.     Equipment Recommendations   None recommended by OT      Precautions/Restrictions   Precautions Precautions: Fall Recall of Precautions/Restrictions: Intact Precaution/Restrictions Comments: Re-educated on back precautions merely for comfort (PT did this with pt earlier) Restrictions Weight Bearing Restrictions Per Provider Order: No     Mobility Bed Mobility Overal bed mobility: Needs Assistance Bed Mobility: Rolling, Sidelying to Sit, Sit to Sidelying Rolling: Min assist, Used rails Sidelying to sit: Contact guard assist (increased time)     Sit to  sidelying: Min assist (A for legs and use of rail) General bed mobility comments: Pt reported that getting up on the left side of the bed was less painful than the right---but when pt went from left side lying to roll to supine his back really caught him and he was in no man's land and very tense from the pain. Had to really coax him to go ahead and roll over onto his back so pain could start subsiding.    Transfers Overall transfer level: Needs assistance Equipment used: Rolling walker (2 wheels) Transfers: Sit to/from Stand Sit to Stand: Contact guard assist, From elevated surface           General transfer comment: VCs for safe hand placement      Balance Overall balance assessment: Needs assistance Sitting-balance support: No upper extremity supported, Feet supported Sitting balance-Leahy Scale: Fair     Standing balance support: Bilateral upper extremity supported, Reliant on assistive device for balance Standing balance-Leahy Scale: Poor                             ADL either performed or assessed with clinical judgement   ADL Overall ADL's : Needs assistance/impaired Eating/Feeding: Independent;Sitting   Grooming: Set up;Supervision/safety;Sitting   Upper Body Bathing: Minimal assistance;Sitting   Lower Body Bathing: Maximal assistance Lower Body Bathing Details (indicate cue type and reason): CGA from raised bed Upper Body Dressing : Minimal assistance;Sitting   Lower Body Dressing: Total assistance Lower Body Dressing Details (indicate cue type and reason): CGA from raised bed Toilet Transfer: Minimal assistance;Ambulation;Rolling walker (2 wheels) Toilet Transfer Details (indicate  cue type and reason): simulated sit<.stand from raised bed>lateral steps up and down the bed wiht RW Toileting- Clothing Manipulation and Hygiene: Moderate assistance Toileting - Clothing Manipulation Details (indicate cue type and reason): CGA from raised bed        General ADL Comments: Educated family on how pt could use the RW as a bed rail if they steadied it for him     Vision Patient Visual Report: No change from baseline              Pertinent Vitals/Pain Pain Assessment Pain Assessment: 0-10 Pain Score: 10-Worst pain ever Pain Location: back (upon returning to bed); with getting up OOB and ambulating short distance (5) Pain Descriptors / Indicators: Shooting, Spasm, Sore, Stabbing Pain Intervention(s): Limited activity within patient's tolerance, Monitored during session, Premedicated before session, Repositioned, Patient requesting pain meds-RN notified, RN gave pain meds during session     Extremity/Trunk Assessment Upper Extremity Assessment Upper Extremity Assessment: Overall WFL for tasks assessed           Communication Communication Communication: No apparent difficulties   Cognition Arousal: Alert Behavior During Therapy: WFL for tasks assessed/performed                                 Following commands: Intact       Cueing  General Comments   Cueing Techniques: Verbal cues  RR as high as 49 when he had the 10/10 pain post rolling over from left side lying. O2 sats remained 94-96% on RA (placed back on O2 at 2 liters at end of session). HR as high as 147 when pain went up. (RN verbally made aware of all of this)           Home Living Family/patient expects to be discharged to:: Private residence Living Arrangements: Spouse/significant other;Other relatives Available Help at Discharge: Family;Available 24 hours/day Type of Home: House Home Access: Stairs to enter [employer] of Steps: 2 Entrance Stairs-Rails: Right Home Layout: One level     Bathroom Shower/Tub: Producer, television/film/video: Standard     Home Equipment: Hand held shower head;Shower seat - built in;Toilet riser;Cane - single point;Rolling Walker (2 wheels);Rollator (4 wheels);BSC/3in1           Prior Functioning/Environment Prior Level of Function : Needs assist;History of Falls (last six months)             Mobility Comments: Using rollator for mobility indoors, SPC outside. Has not been mobilzing much for the past month. ADLs Comments: Needs help getting socks and shoes on, wearing slides.Struggling with pants. Supervised with showers.    OT Problem List: Decreased strength;Decreased range of motion;Decreased activity tolerance;Impaired balance (sitting and/or standing);Pain   OT Treatment/Interventions: Self-care/ADL training;DME and/or AE instruction;Balance training;Patient/family education      OT Goals(Current goals can be found in the care plan section)   Acute Rehab OT Goals Patient Stated Goal: to be in less pain OT Goal Formulation: With patient Time For Goal Achievement: 09/16/23 Potential to Achieve Goals: Good ADL Goals Pt Will Perform Grooming: standing;with modified independence Pt Will Perform Upper Body Bathing: with set-up;sitting Pt Will Perform Lower Body Bathing: with min assist (Mod I sit<>stand) Pt Will Perform Upper Body Dressing: with set-up;sitting Pt Will Perform Lower Body Dressing: with mod assist (Mod I sit<.stand) Pt Will Transfer to Toilet: with modified independence;ambulating;bedside commode (over toilet) Pt Will Perform Toileting - Clothing  Manipulation and hygiene: with min assist (Mod I sit<>stand) Additional ADL Goal #1: Pt will be Mod I in and OOB for basic ADLs   OT Frequency:  Min 2X/week       AM-PAC OT 6 Clicks Daily Activity     Outcome Measure Help from another person eating meals?: None Help from another person taking care of personal grooming?: A Little Help from another person toileting, which includes using toliet, bedpan, or urinal?: A Lot Help from another person bathing (including washing, rinsing, drying)?: A Lot Help from another person to put on and taking off regular upper body clothing?: A Little Help  from another person to put on and taking off regular lower body clothing?: A Lot 6 Click Score: 16   End of Session Equipment Utilized During Treatment: Rolling walker (2 wheels) Nurse Communication: Patient requests pain meds (RR, HR, and sats while I was working with him)  Activity Tolerance: Patient limited by pain Patient left: in bed;with call bell/phone within reach;with bed alarm set;with family/visitor present  OT Visit Diagnosis: Unsteadiness on feet (R26.81);Other abnormalities of gait and mobility (R26.89);Pain Pain - part of body:  (back)                Time: 8545-8468 OT Time Calculation (min): 37 min Charges:  OT General Charges $OT Visit: 1 Visit OT Evaluation $OT Eval Moderate Complexity: 1 Mod OT Treatments $Self Care/Home Management : 8-22 mins  Donny BECKER OT Acute Rehabilitation Services Office [phone]    Rodgers Dorothyann Distel 09/02/2023, 4:36 PM

## 2023-09-02 NOTE — Telephone Encounter (Signed)
 Patient Product/process development scientist completed.    The patient is insured through [employer]. Patient has Medicare and is not eligible for a copay card, but may be able to apply for patient assistance or Medicare RX Payment Plan (Patient Must reach out to their plan, if eligible for payment plan), if available.    Ran test claim for Eliquis 5 mg and the current 30 day co-pay is $0.00.  Ran test claim for Xarelto 20 mg and the current 30 day co-pay is $0.00.  This test claim was processed through [facility]- copay amounts may vary at other pharmacies due to pharmacy/plan contracts, or as the patient moves through the different stages of their insurance plan.     Roland Earl, CPHT Pharmacy Technician III Certified Patient Advocate [facility] Patient Advocate Team Direct Number: [phone]  Fax: [phone]

## 2023-09-02 NOTE — Evaluation (Signed)
 Physical Therapy Evaluation Patient Details Name: Jacob [name] MRN: [id number] DOB: 04-26-1950 Today's Date: 09/02/2023  History of Present Illness  73 y.o. male admitted 8/12 with A-fib with RVR in the setting of new severe back pain.   MRI shows mild generalized spondylosis, L5-S1 facet arthropathy, and L3-4 degenerative disease. PMH: Crohn's disease, CAD, prior stroke, CABG.  Clinical Impression  Pt admitted with above diagnosis. Severely restricted by significant pain and fluctuations in HR ranging from 102-160 bpm. Family reports significant decline in function over the past month as his back pain has worsened, resulting in reduced mobility and multiple falls at home. Also having episodes of dizziness upon standing resulting in falls. Has been using rollator at home but unable to get up much. Orthostatics taken today during visit (see below.) Denies dizziness. Educated extensively on mobility techniques to minimize back pain, as well as exercises to help with symptoms and strengthen while he less mobile. Deferred further gait due to elevated HR in standing (160s). He has been able to ambulate with staff to the restroom during admission. I feel presently, that patient will benefit from continued inpatient follow up therapy, <3 hours/day. It would be beneficial to have staff assist with mobility and a rehab team work regularly on increasing his function while reducing his fall risk. If pain can be controlled adequately enough to allow him to move household distances safely and family feel they can adequately support, suggest HHPT until he can be seen by neurosurgery (scheduled for the 27th with Dr. Malcolm per wife.) Will follow and progress as tolerated acutely. Pt currently with functional limitations due to the deficits listed below (see PT Problem List). Pt will benefit from acute skilled PT to increase their independence and safety with mobility to allow discharge.         09/02/23 1147   Orthostatic Lying   BP- Lying 153/82  Pulse- Lying 121  Orthostatic Sitting  BP- Sitting 109/83  Pulse- Sitting 130  Orthostatic Standing at 0 minutes  BP- Standing at 0 minutes (!) 122/106  Pulse- Standing at 0 minutes 152         If plan is discharge home, recommend the following: A little help with walking and/or transfers;A lot of help with bathing/dressing/bathroom;Assistance with cooking/housework;Assist for transportation;Help with stairs or ramp for entrance   Can travel by private vehicle   Yes    Equipment Recommendations None recommended by PT  Recommendations for Other Services       Functional Status Assessment Patient has had a recent decline in their functional status and demonstrates the ability to make significant improvements in function in a reasonable and predictable amount of time.     Precautions / Restrictions Precautions Precautions: Fall (back for comfort) Recall of Precautions/Restrictions: Intact Precaution/Restrictions Comments: Educated on back precautions merely for comfort. Restrictions Weight Bearing Restrictions Per Provider Order: No      Mobility  Bed Mobility Overal bed mobility: Needs Assistance Bed Mobility: Rolling, Sidelying to Sit, Sit to Sidelying Rolling: Min assist, Used rails Sidelying to sit: Min assist     Sit to sidelying: Min assist, Used rails General bed mobility comments: Educated on log roll technique, requires min assist for LEs, and rail to pull. Min assist for trunk support to rise. Min assist for LEs back into bed. Max cues for alignment to maintain comfort through back. Very rigid.    Transfers Overall transfer level: Needs assistance Equipment used: Rolling walker (2 wheels) Transfers: Sit to/from Stand Sit to  Stand: Min assist           General transfer comment: Min assist for boost to stand and stabilize RW. Stable once standing. Poor control with descent into bed, increased pain.     Ambulation/Gait Ambulation/Gait assistance: Min assist Gait Distance (Feet): 3 Feet Assistive device: Rolling walker (2 wheels) Gait Pattern/deviations: Step-to pattern       General Gait Details: Lateral steps along bed, limited by elevated HR. Repositioned higher in bed. Min assist for RW control. No buckling  Stairs            Wheelchair Mobility     Tilt Bed    Modified Rankin (Stroke Patients Only)       Balance Overall balance assessment: Needs assistance Sitting-balance support: No upper extremity supported, Feet supported Sitting balance-Leahy Scale: Fair     Standing balance support: Bilateral upper extremity supported, Reliant on assistive device for balance Standing balance-Leahy Scale: Poor                               Pertinent Vitals/Pain Pain Assessment Pain Assessment: Faces Pain Score: 8  Pain Location: back, Rt flank, Rt anterior hip. Pain Descriptors / Indicators: Aching Pain Intervention(s): Limited activity within patient's tolerance, Premedicated before session, Repositioned, Monitored during session    Home Living Family/patient expects to be discharged to:: Private residence Living Arrangements: Spouse/significant other;Other relatives Available Help at Discharge: Family;Available 24 hours/day Type of Home: House Home Access: Stairs to enter Entrance Stairs-Rails: Right Entrance Stairs-Number of Steps: 2   Home Layout: One level Home Equipment: Hand held shower head;Shower seat - built in;Toilet riser;Cane - single point;Rolling Walker (2 wheels);Rollator (4 wheels);BSC/3in1      Prior Function Prior Level of Function : Needs assist;History of Falls (last six months)             Mobility Comments: Using rollator for mobility indoors, SPC outside. Has not been mobilzing much for the past month. ADLs Comments: Needs help getting socks and shoes on, wearing slides.Struggling with pants. Supervised with showers.      Extremity/Trunk Assessment   Upper Extremity Assessment Upper Extremity Assessment: Defer to OT evaluation    Lower Extremity Assessment Lower Extremity Assessment: Generalized weakness (Pain limited.)       Communication   Communication Communication: No apparent difficulties    Cognition Arousal: Alert Behavior During Therapy: WFL for tasks assessed/performed   PT - Cognitive impairments: No apparent impairments                         Following commands: Intact       Cueing Cueing Techniques: Verbal cues     General Comments General comments (skin integrity, edema, etc.): See orthostatics tab. HR fluctuated wildly during assessment. Seemed to increase with episodes of pain but highest in standing. Deferred further activity until rate is controlled. Denies lightheadedness. HR 102-160s. Often returned back to 110s shortly after increasing  to higher numbers.    Exercises General Exercises - Lower Extremity Ankle Circles/Pumps: AROM, Both, 10 reps, Supine Quad Sets: Strengthening, Both, 10 reps, Supine Gluteal Sets: Strengthening, Both, 10 reps, Supine Other Exercises Other Exercises: TrA contraction in supine x10 Other Exercises: Hooklying lumbar rotation (short range) x10   Assessment/Plan    PT Assessment Patient needs continued PT services  PT Problem List Decreased strength;Decreased range of motion;Decreased activity tolerance;Decreased balance;Decreased mobility;Pain;Impaired sensation;Cardiopulmonary status limiting activity;Decreased knowledge  of use of DME       PT Treatment Interventions DME instruction;Gait training;Stair training;Functional mobility training;Therapeutic activities;Therapeutic exercise;Neuromuscular re-education;Balance training;Patient/family education;Modalities;Manual techniques    PT Goals (Current goals can be found in the Care Plan section)  Acute Rehab PT Goals Patient Stated Goal: Reduce back pain. PT Goal  Formulation: With patient/family Time For Goal Achievement: 09/16/23 Potential to Achieve Goals: Good    Frequency Min 2X/week     Co-evaluation               AM-PAC PT 6 Clicks Mobility  Outcome Measure Help needed turning from your back to your side while in a flat bed without using bedrails?: A Little Help needed moving from lying on your back to sitting on the side of a flat bed without using bedrails?: A Little Help needed moving to and from a bed to a chair (including a wheelchair)?: A Little Help needed standing up from a chair using your arms (e.g., wheelchair or bedside chair)?: A Little Help needed to walk in hospital room?: A Lot Help needed climbing 3-5 steps with a railing? : Total 6 Click Score: 15    End of Session Equipment Utilized During Treatment: Oxygen Activity Tolerance: Patient limited by pain;Treatment limited secondary to medical complications (Comment) (tachycardia) Patient left: in bed;with call bell/phone within reach;with bed alarm set;with family/visitor present;with SCD's reapplied Nurse Communication: Mobility status (HR elevated; pain) PT Visit Diagnosis: Unsteadiness on feet (R26.81);Other abnormalities of gait and mobility (R26.89);Repeated falls (R29.6);Muscle weakness (generalized) (M62.81);History of falling (Z91.81);Difficulty in walking, not elsewhere classified (R26.2);Pain Pain - part of body:  (back, Rt flank. Rt hip)    Time: 9058-8971 PT Time Calculation (min) (ACUTE ONLY): 47 min   Charges:   PT Evaluation $PT Eval Low Complexity: 1 Low PT Treatments $Therapeutic Exercise: 8-22 mins $Therapeutic Activity: 8-22 mins PT General Charges $$ ACUTE PT VISIT: 1 Visit         Leontine Roads, PT, DPT [facility]  Rehabilitation Services Physical Therapist Office: [phone] Website: [email or web address]   Leontine GORMAN Roads 09/02/2023, 11:55 AM

## 2023-09-02 NOTE — Progress Notes (Signed)
 OT Cancellation Note  Patient Details Name: Jacob [name] MRN: [id number] DOB: Feb 22, 1950   Cancelled Treatment:    Reason Eval/Treat Not Completed: Patient at procedure or test/ unavailable--pt in interventional radiology.  Donny BECKER OT Acute Rehabilitation Services Office [phone]    Rodgers Dorothyann Distel 09/02/2023, 12:38 PM

## 2023-09-02 NOTE — Progress Notes (Addendum)
 Progress Note  Patient Name: Jacob [name] Date of Encounter: 09/02/2023  [facility] Cardiologist: Wilbert Bihari, MD   Patient Profile     Subjective   Admitted with A-fib with RVR in the setting of new severe back pain.  Started on IV Cardizem  gtt and BB but had hypotension last night with SBP into the 60's and these were stopped.  Now in severe back pain and pleuritic right CP this am 10/10 to the point he cannot move.  Tele with afib with RVR at high as the 160's   Inpatient Medications    Scheduled Meds:  acetaminophen   1,000 mg Oral TID   aspirin  EC  81 mg Oral Daily   atorvastatin   80 mg Oral Daily   latanoprost   1 drop Both Eyes QHS   lidocaine   1 patch Transdermal Q24H   sodium chloride  flush  3 mL Intravenous Q12H   tamsulosin   0.4 mg Oral QHS   Continuous Infusions:  heparin  1,400 Units/hr (09/02/23 0335)   PRN Meds: acetaminophen  **OR** acetaminophen , bisacodyl , HYDROmorphone  (DILAUDID ) injection, ketorolac , levalbuterol , methocarbamol  (ROBAXIN ) injection, oxyCODONE , polyethylene glycol, prochlorperazine    Vital Signs    Vitals:   09/01/23 2307 09/02/23 0131 09/02/23 0400 09/02/23 0726  BP: (!) 77/51 (!) 70/47 92/66 124/86  Pulse: 69 66 67 84  Resp: 18 15 13 18   Temp: 98.4 F (36.9 C)  98.2 F (36.8 C) 98.1 F (36.7 C)  TempSrc:   Oral Oral  SpO2: 94% 93% 96% 95%  Weight:      Height:        Intake/Output Summary (Last 24 hours) at 09/02/2023 0856 Last data filed at 09/02/2023 9271 Gross per 24 hour  Intake 1297.54 ml  Output 100 ml  Net 1197.54 ml      09/01/2023   11:18 AM 08/31/2023    6:30 PM 08/28/2023    1:14 PM  Last 3 Weights  Weight (lbs) 210 lb 5.1 oz 202 lb 214 lb  Weight (kg) 95.4 kg 91.627 kg 97.07 kg      Telemetry    Atrial fibrillation with RVR as high as the 160's- Personally Reviewed  ECG    No new EKG to review- Personally Reviewed  Physical Exam   GEN: WD WN in moderate distress due to back pain   Neck:  No JVD Cardiac: Irregularly irregular and tachy no murmurs, rubs, or gallops.  Respiratory: Clear to auscultation bilaterally. GI: Soft, nontender, non-distended  MS: No edema; No deformity.+ tenderness to palpation over right rib cage Neuro:  Nonfocal  Psych: Normal affect   Labs    High Sensitivity Troponin:   Recent Labs  Lab 08/31/23 1856 08/31/23 2012  TROPONINIHS 5 4      Chemistry Recent Labs  Lab 08/28/23 1440 08/31/23 1856 09/01/23 0450 09/02/23 0324  NA 131* 137 135 131*  K 3.8 3.9 4.1 3.6  CL 97* 102 103 99  CO2 22 24 23  21*  GLUCOSE 146* 108* 97 124*  BUN 17 23 19  31*  CREATININE 1.10 1.15 1.03 2.03*  CALCIUM  8.7* 8.5* 8.2* 7.9*  PROT 8.1 6.9  --   --   ALBUMIN 3.6 2.7*  --   --   AST 20 23  --   --   ALT 16 23  --   --   ALKPHOS 79 71  --   --   BILITOT 1.0 0.5  --   --   GFRNONAA >60 >60 >60 34*  ANIONGAP 12 11 9 11      Hematology Recent Labs  Lab 08/31/23 1856 09/01/23 0450 09/02/23 0324  WBC 9.1 8.6 8.9  RBC 3.82* 3.81* 3.46*  HGB 11.3* 11.2* 10.1*  HCT 34.3* 34.0* 30.9*  MCV 89.8 89.2 89.3  MCH 29.6 29.4 29.2  MCHC 32.9 32.9 32.7  RDW 12.8 13.1 13.2  PLT 267 260 214    BNP Recent Labs  Lab 08/31/23 2012  BNP 154.1*     DDimer  Recent Labs  Lab 08/31/23 1856  DDIMER 1.50*     CHA2DS2-VASc Score = 5   This indicates a 7.2% annual risk of stroke. The patient's score is based upon: CHF History: 0 HTN History: 1 Diabetes History: 0 Stroke History: 2 Vascular Disease History: 1 Age Score: 1 Gender Score: 0      Radiology    MR LUMBAR SPINE WO CONTRAST Result Date: 09/01/2023 CLINICAL DATA:  Initial evaluation for acute lumbar radiculopathy. EXAM: MRI LUMBAR SPINE WITHOUT CONTRAST TECHNIQUE: Multiplanar, multisequence MR imaging of the lumbar spine was performed. No intravenous contrast was administered. COMPARISON:  CT from 08/31/2023. FINDINGS: Segmentation: Transitional features about the lumbosacral junction  with a lumbarized S1 segment and rudimentary S1-2 interspace. Alignment: Mild exaggeration of the normal lumbar lordosis. Trace degenerative retrolisthesis of L1 on L2. Vertebrae: Vertebral body height maintained without acute or chronic fracture. Bone marrow signal intensity within normal limits. No discrete or worrisome osseous lesions or abnormal marrow edema. Conus medullaris and cauda equina: Conus extends to the T12-L1 level. Conus and cauda equina appear normal. Paraspinal and other soft tissues: Unremarkable. Disc levels: L1-2: Disc desiccation with diffuse disc bulge, slightly asymmetric to the left. Minimal reactive endplate spurring. No spinal stenosis. Foramina remain patent. L2-3: Disc desiccation. Small left foraminal to extraforaminal disc protrusion closely approximates the exiting left L2 nerve root. Mild bilateral facet hypertrophy. No spinal stenosis. Foramina remain patent. L3-4: Mild intervertebral disc space narrowing with diffuse disc bulge and disc desiccation. Disc bulging asymmetric to the right with right-sided reactive endplate spurring. Mild bilateral facet arthrosis. Resultant mild to moderate narrowing of the right lateral recess. Central canal remains patent. Moderate right L3 foraminal stenosis. Left neural foramen remains patent. L4-5: Disc desiccation with mild disc bulge. Superimposed right foraminal to extraforaminal disc protrusion closely approximates the exiting right L4 nerve root (series 16, image 17). Mild moderate bilateral facet hypertrophy. No significant spinal stenosis. No more than mild bilateral foraminal narrowing. L5-S1: Disc desiccation with diffuse disc bulge. Mild reactive endplate spurring, worse on the right. Moderate facet and ligament flavum hypertrophy. Resultant mild narrowing of the lateral recesses bilaterally. Greater on the right. Central canal remains patent. No significant foraminal stenosis. S1-2: Negative interspace.  Mild facet hypertrophy.  No  stenosis. IMPRESSION: 1. No acute abnormality within the lumbar spine. 2. Right foraminal to extraforaminal disc protrusion at L4-5, potentially affecting the exiting right L4 nerve root. 3. Small left foraminal to extraforaminal disc protrusion at L2-3, potentially affecting the exiting left L2 nerve root. 4. Right eccentric disc bulge with facet hypertrophy at L3-4 with resultant mild to moderate right lateral recess stenosis, with moderate right L3 foraminal narrowing. 5. Disc bulge with facet hypertrophy at L5-S1 with resultant mild bilateral lateral recess stenosis. 6. Transitional lumbosacral anatomy with a lumbarized S1 segment. Careful correlation with numbering system on this exam recommended prior to any potential future intervention. Electronically Signed   By: Morene Hoard M.D.   On: 09/01/2023 03:03   MR BRAIN WO  CONTRAST Result Date: 09/01/2023 CLINICAL DATA:  Initial evaluation for acute neuro deficit, stroke suspected. EXAM: MRI HEAD WITHOUT CONTRAST TECHNIQUE: Multiplanar, multiecho pulse sequences of the brain and surrounding structures were obtained without intravenous contrast. COMPARISON:  CT from 08/31/2023 FINDINGS: Brain: Cerebral volume within normal limits. Scattered subcentimeter foci of T2/FLAIR hyperintensity noted involving the periventricular, deep, and subcortical white matter both cerebral hemispheres, nonspecific, but most commonly related to chronic microvascular ischemic disease. Overall, changes are mild in nature. No evidence for acute or subacute infarct. No areas of chronic cortical infarction. No acute or chronic intracranial blood products. No mass lesion, midline shift or mass effect no hydrocephalus or extra-axial fluid collection. Pituitary gland within normal limits. Vascular: Major intracranial vascular flow voids are maintained. Skull and upper cervical spine: Cranial junction with normal limits. Bone marrow signal intensity normal. No scalp soft tissue  abnormality. Sinuses/Orbits: Prior bilateral ocular lens replacement. Small left maxillary sinus retention cyst. Paranasal sinuses are otherwise largely clear. No significant mastoid effusion. Other: None. IMPRESSION: 1. No acute intracranial abnormality. 2. Mild cerebral white matter disease, nonspecific, but most commonly related to chronic microvascular ischemic disease. Electronically Signed   By: Morene Hoard M.D.   On: 09/01/2023 02:56   US  Abdomen Limited RUQ (LIVER/GB) Result Date: 08/31/2023 CLINICAL DATA:  Pain EXAM: ULTRASOUND ABDOMEN LIMITED RIGHT UPPER QUADRANT COMPARISON:  CT abdomen and pelvis 08/31/2023. FINDINGS: Gallbladder: Multiple gallstones are present measuring up to 1.8 cm. There is no pericholecystic fluid or gallbladder wall thickening. No sonographic Murphy sign noted by sonographer. Common bile duct: Diameter: 4.2 mm Liver: No focal lesion identified. Within normal limits in parenchymal echogenicity. Portal vein is patent on color Doppler imaging with normal direction of blood flow towards the liver. Other: None. IMPRESSION: Cholelithiasis without sonographic evidence of acute cholecystitis. Electronically Signed   By: Greig Pique M.D.   On: 08/31/2023 23:52   CT Angio Chest PE W and/or Wo Contrast Result Date: 08/31/2023 CLINICAL DATA:  Probability pulmonary embolism EXAM: CT ANGIOGRAPHY CHEST WITH CONTRAST TECHNIQUE: Multidetector CT imaging of the chest was performed using the standard protocol during bolus administration of intravenous contrast. Multiplanar CT image reconstructions and MIPs were obtained to evaluate the vascular anatomy. RADIATION DOSE REDUCTION: This exam was performed according to the departmental dose-optimization program which includes automated exposure control, adjustment of the mA and/or kV according to patient size and/or use of iterative reconstruction technique. CONTRAST:  75mL OMNIPAQUE  IOHEXOL  350 MG/ML SOLN COMPARISON:  None Available.  FINDINGS: Cardiovascular: There is adequate opacification of the pulmonary arterial tree. No intraluminal filling defect identified to suggest acute pulmonary embolism. The central pulmonary arteries are enlarged in keeping with changes of pulmonary arterial hypertension. Coronary artery bypass grafting has been performed. Mild global cardiomegaly. No pericardial effusion. No significant atherosclerotic calcification within the thoracic aorta. No aortic aneurysm. Mediastinum/Nodes: No enlarged mediastinal, hilar, or axillary lymph nodes. Thyroid  gland, trachea, and esophagus demonstrate no significant findings. Lungs/Pleura: Moderate right pleural effusion with associated right basilar dependent atelectasis. No superimposed confluent pulmonary infiltrate. No pneumothorax. No pleural effusion on the left. No central obstructing lesion. Upper Abdomen: No acute abnormality. Musculoskeletal: No chest wall abnormality. No acute or significant osseous findings. Review of the MIP images confirms the above findings. IMPRESSION: 1. No pulmonary embolism. 2. Morphologic changes in keeping with pulmonary arterial hypertension. 3. Mild global cardiomegaly. Status post coronary artery bypass grafting. 4. Moderate right pleural effusion with associated right basilar dependent atelectasis. Electronically Signed   By: Dorethia Molt  M.D.   On: 08/31/2023 22:04   CT ABDOMEN PELVIS W CONTRAST Result Date: 08/31/2023 CLINICAL DATA:  Acute abdominal pain EXAM: CT ABDOMEN AND PELVIS WITH CONTRAST TECHNIQUE: Multidetector CT imaging of the abdomen and pelvis was performed using the standard protocol following bolus administration of intravenous contrast. RADIATION DOSE REDUCTION: This exam was performed according to the departmental dose-optimization program which includes automated exposure control, adjustment of the mA and/or kV according to patient size and/or use of iterative reconstruction technique. CONTRAST:  75mL OMNIPAQUE   IOHEXOL  350 MG/ML SOLN COMPARISON:  None Available. FINDINGS: Hepatobiliary: Gallbladder is well distended with multiple dependent gallstones. The liver is within normal limits. Pancreas: Unremarkable. No pancreatic ductal dilatation or surrounding inflammatory changes. Spleen: Normal in size without focal abnormality. Adrenals/Urinary Tract: Adrenal glands are within normal limits bilaterally. Kidneys demonstrate a normal enhancement pattern bilaterally. Few small cysts are noted stable from the prior exam. No follow-up is recommended. No obstructive changes are seen. The bladder is partially distended. Stomach/Bowel: Colon is distended with air although no obstructive or inflammatory changes are seen. Mild scattered fecal material is noted. The appendix is not well visualized. No inflammatory changes to suggest appendicitis are noted. Small bowel and stomach are within normal limits. Vascular/Lymphatic: Aortic atherosclerosis. No enlarged abdominal or pelvic lymph nodes. Reproductive: Prostate is unremarkable. Other: No abdominal wall hernia or abnormality. No abdominopelvic ascites. Musculoskeletal: No acute or significant osseous findings. IMPRESSION: Cholelithiasis without complicating factors. No other focal abnormality is seen. Electronically Signed   By: Oneil Devonshire M.D.   On: 08/31/2023 21:54   CT L-SPINE NO CHARGE Result Date: 08/31/2023 CLINICAL DATA:  Low back pain EXAM: CT LUMBAR SPINE WITHOUT CONTRAST TECHNIQUE: Multidetector CT imaging of the lumbar spine was performed without intravenous contrast administration. Multiplanar CT image reconstructions were also generated. RADIATION DOSE REDUCTION: This exam was performed according to the departmental dose-optimization program which includes automated exposure control, adjustment of the mA and/or kV according to patient size and/or use of iterative reconstruction technique. COMPARISON:  None Available. FINDINGS: Segmentation: Transitional lumbar  anatomy with a lumbarized S1 present and a relatively well developed disc space at S1-2. Alignment: 2-3 mm anterolisthesis L5-S1. Vertebrae: No acute fracture or focal pathologic process. Paraspinal and other soft tissues: See report for accompanying CT examination of the abdomen and pelvis. No paraspinal inflammatory change or fluid collection identified. Disc levels: Intervertebral disc heights are preserved. Spinal canal is widely patent. Osteophytic spurring results in mild bilateral neuroforaminal narrowing at L4-5 and on the right at L5-S1. Remaining neural foramina are widely patent. Mild to moderate facet arthrosis at L5-S1, left greater than right. IMPRESSION: 1. No acute fracture or listhesis of the lumbar spine. 2. Transitional lumbar anatomy with a lumbarized S1 present and a relatively well developed disc space at S1-2. 3. Mild to moderate facet arthrosis at L5-S1, left greater than right. 4. Mild bilateral neuroforaminal narrowing at L4-5 and on the right at L5-S1. Electronically Signed   By: Dorethia Molt M.D.   On: 08/31/2023 21:53   CT Head Wo Contrast Result Date: 08/31/2023 CLINICAL DATA:  Altered mental status EXAM: CT HEAD WITHOUT CONTRAST TECHNIQUE: Contiguous axial images were obtained from the base of the skull through the vertex without intravenous contrast. RADIATION DOSE REDUCTION: This exam was performed according to the departmental dose-optimization program which includes automated exposure control, adjustment of the mA and/or kV according to patient size and/or use of iterative reconstruction technique. COMPARISON:  None Available. FINDINGS: Brain: No evidence of acute  infarction, hemorrhage, hydrocephalus, extra-axial collection or mass lesion/mass effect. Vascular: No hyperdense vessel or unexpected calcification. Skull: Normal. Negative for fracture or focal lesion. Sinuses/Orbits: No acute finding. Other: Mastoid air cells and middle ear cavities are clear. IMPRESSION: 1. No  acute intracranial abnormality. Electronically Signed   By: Dorethia Molt M.D.   On: 08/31/2023 21:47   DG Chest Portable 1 View Result Date: 08/31/2023 CLINICAL DATA:  Shortness of breath EXAM: PORTABLE CHEST 1 VIEW COMPARISON:  01/14/2022 FINDINGS: Cardiac shadow is enlarged. Postsurgical changes are again seen. The overall inspiratory effort is poor. Small right pleural effusion is noted with basilar atelectasis. No bony abnormality is noted. IMPRESSION: Right basilar atelectasis and small effusion. Electronically Signed   By: Oneil Devonshire M.D.   On: 08/31/2023 19:04    Patient Profile     73 y.o. male  with a hx of hypertension, hyperlipidemia, carotid artery stenosis s/p endarterectomy in 2018, prior TIA, prior stroke, Crohn's disease, ulcerative colitis, and multivessel CAD s/p CABG 2005 and PCI to SVG to [place] in 2008 who is being seen 09/01/2023 for the evaluation of new onset atrial fibrillation at the request of CHRISTOBAL Guadalajara MD.   Assessment & Plan    #New onset atrial fibrillation with RVR - Patient recently has been having some symptoms of shortness of breath as well as lower extremity edema and cough for a few weeks which may have been when the A-fib started - He is not really aware of palpitations - CHADS2VASC score  5 (hypertension, age> 65, history of CVA, CAD) - IV Cardizem  drip and beta-blockers were stopped last night due to hypotension as low as the 60's systolic - now in severe pain in his back with pleuritic right CP likely and very tachy with afib RVR HR 160's likely driven by his pain - Labs today K+ 3.6, mag 1.9 - Given his high CHA2DS2-VASc score he is at risk for cardioembolic events if not anticoagulated but I am very concerned about all the falls he is recently had.  Some of these been related to dizziness but others have been related to severe back pain - Currently on IV heparin  drip - 2D echo pending>>needs better HR control before obtaining - TSH normal 1.5 - no PE on  Chest CT - due to significant hypotension overnight will not restart IV Cardizem  or BB and will start IV Amio gtt with 150mg  bolus to try to get better HR control.  Needs better pain control as well. - Recommend PT consult to evaluate for risk of falls to determine if he is an adequate candidate for DOAC therapy  #Acute diastolic CHF  #Elevated BNP  #Shortness of breath  #Lower extremity edema  #Severe right sided Pleuritic CP - He has been having shortness of breath with some lower extremity edema along with cough for few weeks now  - BNP mildly elevated - has had several falls but not on his right side but since admit has had severe pleuritic CP and very tender to palpation over his right rib cage -Chest CT  8/12 with no PE but did showed moderate right pleural effusion>>diff dx includes hemorrhage from rib fx related to recent fall (although no mention on CT of rib fx he is very tender over right rib cage to palpation) vs. CHF from afib with RVR - no evidence of pericardial effusion or aortic aneurysm/dissection on Chest CT - 2D echo pending >>needs to have HR better controlled first - O2 sats 96% on  room air  - consider IR evaluation for diagnostic and therapeutic right thoracentesis today   #Multiple falls #Dizziness - Unclear how much of this is related to his back issues and how much may be related to true dizziness - 1 of these episodes was related to drinking a 0.7 unintentionally resulting in a seizure  - Another fall occurred while trying to get up off the side of the bed to go to the bathroom but was related to patient's limited mobility due to back pain  - Another episode happened while walking after standing up and then 1 more related to working out in the yard check orthostatic blood pressures - Neurosurgery consult appreciated for acute on chronic low back pain with MRI showing mild generalized spondylolysis and DJD.  Plan is for pain management follow-up outpatient -Will get  PT consult   #Acute on chronic back pain - This 2 appears to recently worsened - Neurosurgery has been consulted - reviewed with radiology and no evidence of aortic dissection  - radiology did say that he has cholelithiasis but no evidence of cholecystitis   #ASCAD #Hyperlipidemia -s/p remote history of CABG 2005 -s/p  PCI of SVG to [place] in 2008  -Denies any recent anginal symptoms prior to having all the issues with back pain but then started having sharp chest pain in the right chest that seem to radiate from the back into the right chest -He is also noted some shortness of breath with cough over the past few weeks which I suspect is related to the new A-fib with RVR -High-sensitivity troponin is normal (5>>4) -2D echo pending -Continue aspirin  81 mg daily, atorvastatin  80 mg daily, Toprol -XL 12.5 mg daily   I spent 35 minutes caring for this patient today face to face, ordering and reviewing labs, reviewing records from 2D echo  05/03/23, chest CTA 08/31/23, seeing the patient, documenting in the record, and discussing with hospitalist  For questions or updates, please contact [facility] Please consult [email or web address] for contact info under        Signed, Wilbert Bihari, MD  09/02/2023, 8:56 AM

## 2023-09-03 ENCOUNTER — Inpatient Hospital Stay (HOSPITAL_COMMUNITY)

## 2023-09-03 DIAGNOSIS — N179 Acute kidney failure, unspecified: Secondary | ICD-10-CM

## 2023-09-03 DIAGNOSIS — R0603 Acute respiratory distress: Secondary | ICD-10-CM

## 2023-09-03 DIAGNOSIS — R0902 Hypoxemia: Secondary | ICD-10-CM

## 2023-09-03 DIAGNOSIS — R899 Unspecified abnormal finding in specimens from other organs, systems and tissues: Secondary | ICD-10-CM | POA: Diagnosis not present

## 2023-09-03 DIAGNOSIS — I4891 Unspecified atrial fibrillation: Secondary | ICD-10-CM | POA: Diagnosis not present

## 2023-09-03 DIAGNOSIS — R091 Pleurisy: Secondary | ICD-10-CM

## 2023-09-03 DIAGNOSIS — I2510 Atherosclerotic heart disease of native coronary artery without angina pectoris: Secondary | ICD-10-CM | POA: Diagnosis not present

## 2023-09-03 DIAGNOSIS — M545 Low back pain, unspecified: Secondary | ICD-10-CM | POA: Diagnosis not present

## 2023-09-03 DIAGNOSIS — R0781 Pleurodynia: Secondary | ICD-10-CM | POA: Diagnosis not present

## 2023-09-03 DIAGNOSIS — E7800 Pure hypercholesterolemia, unspecified: Secondary | ICD-10-CM | POA: Diagnosis not present

## 2023-09-03 DIAGNOSIS — I10 Essential (primary) hypertension: Secondary | ICD-10-CM | POA: Diagnosis not present

## 2023-09-03 DIAGNOSIS — K567 Ileus, unspecified: Secondary | ICD-10-CM

## 2023-09-03 LAB — ECHOCARDIOGRAM COMPLETE
AR max vel: 3.14 cm2
AV Area VTI: 3.23 cm2
AV Area mean vel: 2.99 cm2
AV Mean grad: 3 mmHg
AV Peak grad: 5.9 mmHg
Ao pk vel: 1.21 m/s
Area-P 1/2: 3.77 cm2
Height: 71 in
S' Lateral: 2.67 cm
Weight: 3365.1 [oz_av]

## 2023-09-03 LAB — CBC
HCT: 31.8 % — ABNORMAL LOW (ref 39.0–52.0)
HCT: 33.8 % — ABNORMAL LOW (ref 39.0–52.0)
Hemoglobin: 10.5 g/dL — ABNORMAL LOW (ref 13.0–17.0)
Hemoglobin: 10.8 g/dL — ABNORMAL LOW (ref 13.0–17.0)
MCH: 29.2 pg (ref 26.0–34.0)
MCH: 29 pg (ref 26.0–34.0)
MCHC: 33 g/dL (ref 30.0–36.0)
MCHC: 32 g/dL (ref 30.0–36.0)
MCV: 88.3 fL (ref 80.0–100.0)
MCV: 90.9 fL (ref 80.0–100.0)
Platelets: 254 K/uL (ref 150–400)
Platelets: 229 K/uL (ref 150–400)
RBC: 3.6 MIL/uL — ABNORMAL LOW (ref 4.22–5.81)
RBC: 3.72 MIL/uL — ABNORMAL LOW (ref 4.22–5.81)
RDW: 13.2 % (ref 11.5–15.5)
RDW: 13.2 % (ref 11.5–15.5)
WBC: 9.8 K/uL (ref 4.0–10.5)
WBC: 9.9 K/uL (ref 4.0–10.5)
nRBC: 0 % (ref 0.0–0.2)
nRBC: 0 % (ref 0.0–0.2)

## 2023-09-03 LAB — MRSA NEXT GEN BY PCR, NASAL: MRSA by PCR Next Gen: NOT DETECTED

## 2023-09-03 LAB — BASIC METABOLIC PANEL WITH GFR
Anion gap: 9 (ref 5–15)
Anion gap: 12 (ref 5–15)
BUN: 23 mg/dL (ref 8–23)
BUN: 18 mg/dL (ref 8–23)
CO2: 26 mmol/L (ref 22–32)
CO2: 22 mmol/L (ref 22–32)
Calcium: 8.2 mg/dL — ABNORMAL LOW (ref 8.9–10.3)
Calcium: 8.2 mg/dL — ABNORMAL LOW (ref 8.9–10.3)
Chloride: 99 mmol/L (ref 98–111)
Chloride: 101 mmol/L (ref 98–111)
Creatinine, Ser: 1.19 mg/dL (ref 0.61–1.24)
Creatinine, Ser: 1.11 mg/dL (ref 0.61–1.24)
GFR, Estimated: >60 mL/min (ref >60)
GFR, Estimated: >60 mL/min (ref >60)
Glucose, Bld: 129 mg/dL — ABNORMAL HIGH (ref 70–99)
Glucose, Bld: 108 mg/dL — ABNORMAL HIGH (ref 70–99)
Potassium: 3.8 mmol/L (ref 3.5–5.1)
Potassium: 4 mmol/L (ref 3.5–5.1)
Sodium: 134 mmol/L — ABNORMAL LOW (ref 135–145)
Sodium: 135 mmol/L (ref 135–145)

## 2023-09-03 LAB — TYPE AND SCREEN
ABO/RH(D): O POS
Antibody Screen: NEGATIVE

## 2023-09-03 LAB — HEPARIN LEVEL (UNFRACTIONATED)
Heparin Unfractionated: 0.26 [IU]/mL — ABNORMAL LOW (ref 0.30–0.70)
Heparin Unfractionated: 0.5 [IU]/mL (ref 0.30–0.70)

## 2023-09-03 LAB — GLUCOSE, CAPILLARY: Glucose-Capillary: 104 mg/dL — ABNORMAL HIGH (ref 70–99)

## 2023-09-03 LAB — LACTIC ACID, PLASMA: Lactic Acid, Venous: 0.8 mmol/L (ref 0.5–1.9)

## 2023-09-03 LAB — SEDIMENTATION RATE: Sed Rate: 70 mm/h — ABNORMAL HIGH (ref 0–16)

## 2023-09-03 LAB — CYTOLOGY - NON PAP

## 2023-09-03 LAB — PROCALCITONIN: Procalcitonin: 0.22 ng/mL

## 2023-09-03 MED ORDER — LIDOCAINE HCL (PF) 1 % IJ SOLN
INTRAMUSCULAR | Status: AC
  Administered 2023-09-03: 10 mL
  Filled 2023-09-03: qty 10

## 2023-09-03 MED ORDER — STERILE WATER FOR INJECTION IJ SOLN
5.0000 mg | Freq: Once | RESPIRATORY_TRACT | Status: AC
  Administered 2023-09-03: 5 mg via INTRAPLEURAL
  Filled 2023-09-03: qty 5

## 2023-09-03 MED ORDER — SODIUM CHLORIDE (PF) 0.9 % IJ SOLN
10.0000 mg | Freq: Once | INTRAMUSCULAR | Status: AC
  Administered 2023-09-03: 10 mg via INTRAPLEURAL
  Filled 2023-09-03: qty 10

## 2023-09-03 MED ORDER — SODIUM CHLORIDE 0.9% FLUSH
10.0000 mL | Freq: Three times a day (TID) | INTRAVENOUS | Status: DC
  Administered 2023-09-03 – 2023-09-06 (×8): 10 mL via INTRAPLEURAL

## 2023-09-03 MED ORDER — DIPHENHYDRAMINE HCL 12.5 MG/5ML PO ELIX: 12.5000 mg | ORAL_SOLUTION | Freq: Four times a day (QID) | ORAL | Status: DC | PRN

## 2023-09-03 MED ORDER — IOHEXOL 350 MG/ML SOLN
100.0000 mL | Freq: Once | INTRAVENOUS | Status: AC | PRN
  Administered 2023-09-03: 100 mL via INTRAVENOUS

## 2023-09-03 MED ORDER — SODIUM CHLORIDE 0.9% FLUSH: 9.0000 mL | INTRAVENOUS | Status: DC | PRN

## 2023-09-03 MED ORDER — VANCOMYCIN HCL 1750 MG/350ML IV SOLN
1750.0000 mg | INTRAVENOUS | Status: DC
  Administered 2023-09-04: 1750 mg via INTRAVENOUS
  Filled 2023-09-03 (×2): qty 350

## 2023-09-03 MED ORDER — DIAZEPAM 5 MG/ML IJ SOLN: 5.0000 mg | Freq: Four times a day (QID) | INTRAMUSCULAR | Status: DC | PRN

## 2023-09-03 MED ORDER — DIAZEPAM 5 MG/ML IJ SOLN
2.5000 mg | INTRAMUSCULAR | Status: DC | PRN
  Administered 2023-09-03 – 2023-09-05 (×3): 2.5 mg via INTRAVENOUS
  Filled 2023-09-03 (×3): qty 2

## 2023-09-03 MED ORDER — DIAZEPAM 5 MG/ML IJ SOLN
5.0000 mg | Freq: Once | INTRAMUSCULAR | Status: AC
  Administered 2023-09-03: 5 mg via INTRAVENOUS
  Filled 2023-09-03: qty 2

## 2023-09-03 MED ORDER — LEVALBUTEROL HCL 0.63 MG/3ML IN NEBU
0.6300 mg | INHALATION_SOLUTION | Freq: Three times a day (TID) | RESPIRATORY_TRACT | Status: AC
  Administered 2023-09-03 – 2023-09-04 (×4): 0.63 mg via RESPIRATORY_TRACT
  Filled 2023-09-03 (×4): qty 3

## 2023-09-03 MED ORDER — HYDROMORPHONE HCL 1 MG/ML IJ SOLN
2.0000 mg | Freq: Once | INTRAMUSCULAR | Status: AC
  Administered 2023-09-03: 2 mg via INTRAVENOUS
  Filled 2023-09-03: qty 2

## 2023-09-03 MED ORDER — HYDROMORPHONE 1 MG/ML IV SOLN
INTRAVENOUS | Status: DC
  Administered 2023-09-03 (×2): 30 mg via INTRAVENOUS
  Filled 2023-09-03 (×3): qty 30

## 2023-09-03 MED ORDER — GABAPENTIN 250 MG/5ML PO SOLN
100.0000 mg | Freq: Three times a day (TID) | ORAL | Status: DC
  Administered 2023-09-03 (×2): 100 mg via ORAL
  Filled 2023-09-03 (×4): qty 2

## 2023-09-03 MED ORDER — VANCOMYCIN HCL 2000 MG/400ML IV SOLN
2000.0000 mg | Freq: Once | INTRAVENOUS | Status: AC
  Administered 2023-09-03: 2000 mg via INTRAVENOUS
  Filled 2023-09-03: qty 400

## 2023-09-03 MED ORDER — HYDROMORPHONE 1 MG/ML IV SOLN
INTRAVENOUS | Status: DC
  Administered 2023-09-03: 30 mg via INTRAVENOUS
  Administered 2023-09-04: 3.25 mg via INTRAVENOUS
  Administered 2023-09-04: 2.91 mg via INTRAVENOUS
  Administered 2023-09-04: 2.48 mg via INTRAVENOUS

## 2023-09-03 MED ORDER — NALOXONE HCL 0.4 MG/ML IJ SOLN: 0.4000 mg | INTRAMUSCULAR | Status: DC | PRN

## 2023-09-03 MED ORDER — PIPERACILLIN-TAZOBACTAM 3.375 G IVPB
3.3750 g | Freq: Three times a day (TID) | INTRAVENOUS | Status: DC
  Administered 2023-09-03 – 2023-09-05 (×6): 3.375 g via INTRAVENOUS
  Filled 2023-09-03 (×7): qty 50

## 2023-09-03 MED ORDER — HYDROMORPHONE 1 MG/ML IV SOLN: INTRAVENOUS | Status: DC

## 2023-09-03 MED ORDER — CHLORHEXIDINE GLUCONATE CLOTH 2 % EX PADS
6.0000 | MEDICATED_PAD | Freq: Every day | CUTANEOUS | Status: DC
  Administered 2023-09-03 – 2023-09-07 (×5): 6 via TOPICAL

## 2023-09-03 MED ORDER — DIPHENHYDRAMINE HCL 50 MG/ML IJ SOLN
12.5000 mg | Freq: Four times a day (QID) | INTRAMUSCULAR | Status: DC | PRN
Start: 2023-09-03 — End: 2023-09-09

## 2023-09-03 MED ORDER — DIAZEPAM 5 MG/ML IJ SOLN
2.5000 mg | Freq: Four times a day (QID) | INTRAMUSCULAR | Status: DC | PRN
  Administered 2023-09-03: 2.5 mg via INTRAVENOUS
  Filled 2023-09-03: qty 2

## 2023-09-03 NOTE — Progress Notes (Signed)
 PHARMACY - ANTICOAGULATION CONSULT NOTE  Pharmacy Consult for heparin   Indication: atrial fibrillation  No Known Allergies  Patient Measurements: Height: 5' 11 (180.3 cm) Weight: 95.4 kg (210 lb 5.1 oz) IBW/kg (Calculated) : 75.3 HEPARIN  DW (KG): 94.5  Vital Signs: Temp: 98.8 F (37.1 C) (08/14 2300) Temp Source: Oral (08/14 2300) BP: 142/85 (08/14 2300) Pulse Rate: 86 (08/14 2300)  Labs: Recent Labs    08/31/23 1856 08/31/23 2012 09/01/23 0450 09/01/23 1337 09/02/23 0324 09/02/23 1605 09/03/23 0118  HGB 11.3*  --  11.2*  --  10.1*  --  10.5*  HCT 34.3*  --  34.0*  --  30.9*  --  31.8*  PLT 267  --  260  --  214  --  254  HEPARINUNFRC  --   --   --  0.37  --  0.14* 0.26*  CREATININE 1.15  --  1.03  --  2.03*  --   --   TROPONINIHS 5 4  --   --   --   --   --     Estimated Creatinine Clearance: 39.3 mL/min (A) (by C-G formula based on SCr of 2.03 mg/dL (H)).   Assessment: 72 yoM presented to ED with abdominal/back pain and found to be in afib RVR. Pharmacy consulted to dose heparin  for afib. -No hx of afib or DOAC use  Heparin  level down to 0.14 (subtherapeutic) on 1400 units/hr. Of note, heparin  was held briefly during procedure ~1230 so may be falsely low. No bleeding noted.   8/15 AM - s/p thoracentesis 8/14 with 350 cc removed.  Heparin  level 0.26 remains below goal on 1550 units/hr.  No infusion issues or s/sx bleeding per RN.  Goal of Therapy:  Heparin  level 0.3-0.7 units/ml Monitor platelets by anticoagulation protocol: Yes   Plan:  Increase heparin  drip slightly to 1650 uts/hr F/u 8 hr heparin  level F/u long term anticoag plans  Maurilio Fila, PharmD Clinical Pharmacist 09/03/2023  2:56 AM

## 2023-09-03 NOTE — Progress Notes (Addendum)
 PROGRESS NOTE    Jacob [name]  FMW:[id number] DOB: April 07, 1950 DOA: 08/31/2023 PCP: [name], MD   Brief Narrative: Jacob [name] is a 73 y.o. male with a history of CAD s/p CABG, hyperlipidemia, hypertension, Crohn's disease, chronic low back pain.  Patient presented secondary to worsening back pain and right-sided chest pain. During hospitalization, patient developed atrial fibrillation with RVR, which was complicated by hypotension on diltiazem  drip. Patient's pain has been difficult to control.   Assessment and Plan:  Acute on chronic low back pain History of multiple falls. MRI obtained and was significant for multilevel degenerative disc disease with foraminal/extraforaminal disc protrusions with resultant possible nerve root impingement; also with multilevel spinal stenosis and transitional lumbosacral anatomy. Neurosurgery was consulted and recommended no inpatient management and for outpatient pain management referral. Patient states they will follow-up with Dr. Louis, neurosurgery, as an outpatient. -Continue PT/OT -LSO brace while ambulating  Right sided pleuritic chest pain Point tenderness around the sixth rib. No associated known falls. CT imaging without evidence of fracture. Pleural effusion noted on that side, however, so injury could still be a possibility. Pain does not wrap around chest, so unlikely nerve related. Patient does have cholelithiasis, but I don't believe referred pain would manifest at his current area of pain. PCA pump started by PCCM -Continue analgesics  Right moderate pleural effusion Unclear etiology. Patient with a history of multiple falls so possibly inflammatory and related to injury. No symptoms concerning for infection, however gram stain significant for GPCs in clusters. Fluid analysis significant for orange/cloudy fluid and significant WBC concerning for possibly chylous fluid. Triglycerides added. Fluid is exudative based on  Light's Criteria. Pulmonology on board. -Empiric Vancomycin  pending culture data -Follow-up pleural fluid culture and cytology -Follow-up pleural fluid triglycerides -Pulmonology recommendations: CT chest/abdomen/pelvis, plan for possible chest tube  Atrial fibrillation with RVR New diagnosis. Cardiology consulted. Patient started on diltiazem  IV but developed subsequent hypotension. Patient with persistent RVR at this time. Heparin  IV started -Cardiology recommendations: amiodarone , heparin  IV  Wheezing Patient with development of significant atelectasis likely secondary to splinting. -Albuterol as needed  AKI Likely ATN related to hypotension from diltiazem  IV drip. Resolved.  Carotid artery stenosis Noted. Patient is s/p history of endarterectomy. -Continue aspirin  -Continue Lipitor  History of crohn's disease Noted.  Asymptomatic cholelithiasis Noted on ultrasound.   CAD No evidence of angina. Patient was on metoprolol  which is held. -Continue aspirin  and Lipitor  Primary hypertension Patient was managed on lisinopril  and amlodipine  which were discontinued after starting Cardizem .  Normocytic anemia Baseline hemoglobin appears to be around 11-12. Currently stable.   DVT prophylaxis: Heparin  IV Code Status:   Code Status: Full Code Family Communication: Wife and son at bedside Disposition Plan: Patient transferring to ICU.   Consultants:  Cardiology Neurosurgery PCCM  Procedures:  Transthoracic Echocardiogram  Antimicrobials: Vancomycin  IV    Subjective: Ongoing chest pain and now with some dyspnea as well.   Objective: BP 123/88 (BP Location: Right Arm)   Pulse 81   Temp 98.2 F (36.8 C) (Oral)   Resp 16   Ht 5' 11 (1.803 m)   Wt 95.4 kg   SpO2 93%   BMI 29.33 kg/m   Examination:  General exam: Appears uncomfortable and in slight distress Respiratory system: Diminished with wheezing L>R. Tachypnea. Cardiovascular system: S1 & S2 heard,  RRR. No murmurs. Gastrointestinal system: Abdomen is nondistended, soft and nontender. Normal bowel sounds heard. Central nervous system: Alert and oriented. No focal  neurological deficits. Psychiatry: Judgement and insight appear normal. Mood & affect appropriate.    Data Reviewed: I have personally reviewed following labs and imaging studies  CBC Lab Results  Component Value Date   WBC 9.8 09/03/2023   RBC 3.60 (L) 09/03/2023   HGB 10.5 (L) 09/03/2023   HCT 31.8 (L) 09/03/2023   MCV 88.3 09/03/2023   MCH 29.2 09/03/2023   PLT 254 09/03/2023   MCHC 33.0 09/03/2023   RDW 13.2 09/03/2023   LYMPHSABS 1.3 08/31/2023   MONOABS 0.9 08/31/2023   EOSABS 0.2 08/31/2023   BASOSABS 0.0 08/31/2023     Last metabolic panel Lab Results  Component Value Date   NA 134 (L) 09/03/2023   K 3.8 09/03/2023   CL 99 09/03/2023   CO2 26 09/03/2023   BUN 23 09/03/2023   CREATININE 1.19 09/03/2023   GLUCOSE 129 (H) 09/03/2023   GFRNONAA >60 09/03/2023   GFRAA 66 06/07/2019   CALCIUM  8.2 (L) 09/03/2023   PROT 5.8 (L) 09/02/2023   ALBUMIN 2.2 (L) 09/02/2023   LABGLOB 2.8 06/07/2019   AGRATIO 1.6 06/07/2019   BILITOT 0.5 08/31/2023   ALKPHOS 71 08/31/2023   AST 23 08/31/2023   ALT 23 08/31/2023   ANIONGAP 9 09/03/2023    GFR: Estimated Creatinine Clearance: 67.1 mL/min (by C-G formula based on SCr of 1.19 mg/dL).  Recent Results (from the past 240 hours)  Culture, body fluid w Gram Stain-bottle     Status: None (Preliminary result)   Collection Time: 09/02/23 12:36 PM   Specimen: Pleura  Result Value Ref Range Status   Specimen Description PLEURAL RIGHT  Final   Special Requests NONE  Final   Gram Stain   Final    GRAM POSITIVE COCCI IN CLUSTERS IN BOTH AEROBIC AND ANAEROBIC BOTTLES CRITICAL RESULT CALLED TO, READ BACK BY AND VERIFIED WITH: RN LONELL MATSU [id number] [phone] FCP Performed at [facility], [address] [place], [place] [address]    Culture GRAM POSITIVE COCCI  Final    Report Status PENDING  Incomplete  Gram stain     Status: None   Collection Time: 09/02/23 12:36 PM   Specimen: Pleura  Result Value Ref Range Status   Specimen Description PLEURAL RIGHT  Final   Special Requests NONE  Final   Gram Stain   Final    RARE WBC PRESENT, PREDOMINANTLY PMN NO ORGANISMS SEEN Performed at [facility], [address] [place], [place] [address]    Report Status 09/02/2023 FINAL  Final      Radiology Studies: DG Ribs Unilateral W/Chest Right Result Date: 09/02/2023 CLINICAL DATA:  Right-sided chest pain EXAM: RIGHT RIBS AND CHEST - 3+ VIEW COMPARISON:  Chest x-ray 09/02/2023.  Chest 08/31/2023 FINDINGS: No fracture or other bone lesions are seen involving the ribs. Lung volumes are low. There are atelectatic changes in the right lower lung. There is no pleural effusion or pneumothorax. Patient is status post cardiac surgery. Cardiomediastinal silhouette is stable. There is no pneumothorax. IMPRESSION: 1. No evidence of rib fracture. 2. Low lung volumes with atelectatic changes in the right lower lung. Electronically Signed   By: Greig Pique M.D.   On: 09/02/2023 22:57   IR THORACENTESIS ASP PLEURAL SPACE W/IMG GUIDE Result Date: 09/02/2023 INDICATION: Inpatient with new onset Afib with RVR on amiodarone  and acute CHF. Right pleural effusion noted on recent imaging, request for diagnostic and therapeutic thoracentesis. EXAM: ULTRASOUND GUIDED RIGHT THORACENTESIS MEDICATIONS: 10 mL 1% lidocaine   COMPLICATIONS: None immediate. PROCEDURE: An ultrasound guided thoracentesis was thoroughly discussed with the patient and questions answered. The benefits, risks, alternatives and complications were also discussed. The patient understands and wishes to proceed with the procedure. Written consent was obtained. Ultrasound was performed to localize and mark an adequate pocket of fluid in the right chest. The area was then prepped and draped in the normal sterile fashion. 1%  Lidocaine  was used for local anesthesia. Under ultrasound guidance a 6 Fr Safe-T-Centesis catheter was introduced. Thoracentesis was performed. Patient reported back spasm during exam and requested exam be ended due to pain. The catheter was removed and a dressing applied. CXR ordered post-procedure. FINDINGS: A total of approximately 350 milliliters of amber fluid was removed. Samples were sent to the laboratory as requested by the clinical team. IMPRESSION: Successful ultrasound guided right thoracentesis yielding 350 milliliters of pleural fluid. Performed and dictated by Laymon Coast, NP Electronically Signed   By: Ester Sides M.D.   On: 09/02/2023 13:07   DG Chest Port 1 View Result Date: 09/02/2023 CLINICAL DATA:  Pleural effusion.  Status post right thoracentesis. EXAM: PORTABLE CHEST 1 VIEW COMPARISON:  08/31/2023 FINDINGS: Low volume film. No evidence for pneumothorax. Decrease in right pleural effusion since prior study with persistent bibasilar atelectasis. Gaseous distention of the stomach evident under the left hemidiaphragm. Nodular density noted left lung base with no suspicious pulmonary nodule or mass in this region on CT scan 2 days ago. The cardio pericardial silhouette is enlarged. IMPRESSION: 1. No evidence for pneumothorax after right thoracentesis. 2. Decrease in right pleural effusion with persistent bibasilar atelectasis. Electronically Signed   By: Camellia Candle M.D.   On: 09/02/2023 12:53      LOS: 3 days    Elgin Lam, MD [facility] 09/03/2023, 10:04 AM   If 7PM-7AM, please contact night-coverage [email or web address]

## 2023-09-03 NOTE — Procedures (Signed)
 Insertion of Chest Tube Procedure Note  Jacob [name]  [id number]  March 12, 1950  Date:09/03/23  Time:2:43 PM    Provider Performing: Toribio JAYSON Sharps   Procedure: Pleural Catheter Insertion w/o Imaging Guidance (67443)  Indication(s) Effusion  Consent Risks of the procedure as well as the alternatives and risks of each were explained to the patient and/or caregiver.  Consent for the procedure was obtained and is signed in the bedside chart  Anesthesia Topical only with 1% lidocaine     Time Out Verified patient identification, verified procedure, site/side was marked, verified correct patient position, special equipment/implants available, medications/allergies/relevant history reviewed, required imaging and test results available.   Sterile Technique Maximal sterile technique including full sterile barrier drape, hand hygiene, sterile gown, sterile gloves, mask, hair covering, sterile ultrasound probe cover (if used).   Procedure Description Ultrasound used to identify appropriate pleural anatomy for placement and overlying skin marked. Area of placement cleaned and draped in sterile fashion.  A 14 French pigtail pleural catheter was placed into the right pleural space using Seldinger technique. Appropriate return of fluid was obtained.  The tube was connected to atrium and placed on -20 cm H2O wall suction.   Complications/Tolerance None; patient tolerated the procedure well. Chest X-ray is ordered to verify placement.   EBL Minimal  Specimen(s) none

## 2023-09-03 NOTE — Progress Notes (Signed)
*  PRELIMINARY RESULTS* Echocardiogram 2D Echocardiogram has been performed.  Jacob [name] 09/03/2023, 12:03 PM

## 2023-09-03 NOTE — Progress Notes (Signed)
 PHARMACY - ANTICOAGULATION CONSULT NOTE  Pharmacy Consult for heparin   Indication: atrial fibrillation  No Known Allergies  Patient Measurements: Height: 5' 11 (180.3 cm) Weight: 95.4 kg (210 lb 5.1 oz) IBW/kg (Calculated) : 75.3 HEPARIN  DW (KG): 94.5  Vital Signs: Temp: 98.2 F (36.8 C) (08/15 0804) Temp Source: Oral (08/15 0804) BP: 123/88 (08/15 0804) Pulse Rate: 81 (08/15 0804)  Labs: Recent Labs    08/31/23 1856 08/31/23 2012 09/01/23 0450 09/01/23 1337 09/02/23 0324 09/02/23 1605 09/03/23 0118 09/03/23 0837  HGB 11.3*  --  11.2*  --  10.1*  --  10.5*  --   HCT 34.3*  --  34.0*  --  30.9*  --  31.8*  --   PLT 267  --  260  --  214  --  254  --   HEPARINUNFRC  --   --   --    < >  --  0.14* 0.26* 0.50  CREATININE 1.15  --  1.03  --  2.03*  --  1.19  --   TROPONINIHS 5 4  --   --   --   --   --   --    < > = values in this interval not displayed.    Estimated Creatinine Clearance: 67.1 mL/min (by C-G formula based on SCr of 1.19 mg/dL).   Assessment: 70 yoM presented to ED with abdominal/back pain and found to be in afib RVR. Pharmacy consulted to dose heparin  for afib. -No hx of afib or DOAC use  Heparin  level therapeutic 0.50 (therapeutic) on 1650 units/hr. CBC stable and no over s/sx of bleeding reported.   Goal of Therapy:  Heparin  level 0.3-0.7 units/ml Monitor platelets by anticoagulation protocol: Yes   Plan:  Continue heparin  drip at 1650 units/hr F/u 8 hr heparin  level F/u long term anticoag plans  Koren Or, PharmD Clinical Pharmacist 09/03/2023 10:55 AM Please check AMION for all [facility] Pharmacy numbers

## 2023-09-03 NOTE — Progress Notes (Signed)
 Orthopedic Tech Progress Note Patient Details:  Jacob [name] [birth year] [id number]  Ortho Devices Type of Ortho Device: Lumbar corsett Ortho Device/Splint Interventions: Ordered      [name] 09/03/2023, 8:27 AM

## 2023-09-03 NOTE — Consult Note (Signed)
 NAME:  Jacob [name], MRN:  [id number], DOB:  1950-02-11, LOS: 3 ADMISSION DATE:  08/31/2023, CONSULTATION DATE:  09/03/23 REFERRING MD:  Shlomo, CHIEF COMPLAINT:  back pain   History of Present Illness:  73 year old man w/ CAD, previously functional presenting with 1 month of worsening back pain and multiple falls.  Workup to date neg.  This time coming in with associated afib/RVR so admitted.  Imaging revealed R pleural effusion and this was drained yesterday.  This am began having R pleurisy associated with ongoing back pain and increasing dyspnea.  PCCM consulted.  ROS notable for severe constipation with opiate use at home.  Pertinent  Medical History   Past Medical History:  Diagnosis Date   CAD (coronary artery disease), native coronary artery     multivessel ASCAD s/p CABG and s/p PCI of SVG to RI 2008   Carotid artery stenosis    patent right CEA site and 1-39% Lcarotid stenosis   Crohn's disease (HCC)    flare 2000   Depression    Dyslipidemia    Dyspnea    ED (erectile dysfunction)    History of blood transfusion 2005   Hypertension    Myocardial infarction (HCC) 2005   5 bypasses   Nephrolithiasis    Stroke (HCC)    TIA- Endartarectemy R side   TIA (transient ischemic attack)    Ulcerative colitis (HCC)    Left sided     Significant Hospital Events: Including procedures, antibiotic start and stop dates in addition to other pertinent events   8/14 admit  Interim History / Subjective:  consult  Objective    Blood pressure 123/88, pulse 81, temperature 98.2 F (36.8 C), temperature source Oral, resp. rate 16, height 5' 11 (1.803 m), weight 95.4 kg, SpO2 93%.        Intake/Output Summary (Last 24 hours) at 09/03/2023 1002 Last data filed at 09/03/2023 0900 Gross per 24 hour  Intake 1312.42 ml  Output 1325 ml  Net -12.58 ml   Filed Weights   08/31/23 1830 09/01/23 1118  Weight: 91.6 kg 95.4 kg    Examination: General: moaning in bed HENT: MMM,  trachea midline Lungs: diminshed bases, stridorous breath sound more apically that ease up as patient calms Cardiovascular: regular and maybe in sinus on monitor? Abdomen: distended, mildly TTP, hypoactive BS, tympanic Extremities: warm Neuro: moves everything Skin: no rashes US : R lung moving no residual effusion, +lung sliding; both sides notable for marked diaphragm elevation  Labs/imaging reviewed  Resolved problem list   Assessment and Plan  Severe back pain x 1 month with associated splinting with imaging neg to date Infected R pleural space New severe pleurisy after thora: CXR shows some haziness; US  unrevealing Afib/RVR- bedside echo looks okay AKI improved Hypoxemia Ileus likely related to poor mobility and opiate use  Given clinical decline will do aorta-gated CTA Agree with vanc; check blood cultures, ESR, Pct F/u echo Adding valium  PRN muscle spasms, standing gabapentin / tylenol , and starting PCA Check lactate, ESR, CBC Hold heparin  until CTA done If lactate and CTA are reassuring he can probably stay in PCU otherwise will transfer to ICU Family updated at bedside Will follow   Labs   CBC: Recent Labs  Lab 08/28/23 1440 08/31/23 1856 09/01/23 0450 09/02/23 0324 09/03/23 0118  WBC 9.1 9.1 8.6 8.9 9.8  NEUTROABS  --  6.7  --   --   --   HGB 11.0* 11.3* 11.2* 10.1* 10.5*  HCT 33.5*  34.3* 34.0* 30.9* 31.8*  MCV 88.6 89.8 89.2 89.3 88.3  PLT 220 267 260 214 254    Basic Metabolic Panel: Recent Labs  Lab 08/28/23 1440 08/31/23 1856 09/01/23 0450 09/02/23 0324 09/03/23 0118  NA 131* 137 135 131* 134*  K 3.8 3.9 4.1 3.6 3.8  CL 97* 102 103 99 99  CO2 22 24 23  21* 26  GLUCOSE 146* 108* 97 124* 129*  BUN 17 23 19  31* 23  CREATININE 1.10 1.15 1.03 2.03* 1.19  CALCIUM  8.7* 8.5* 8.2* 7.9* 8.2*  MG  --  2.0 1.9  --   --    GFR: Estimated Creatinine Clearance: 67.1 mL/min (by C-G formula based on SCr of 1.19 mg/dL). Recent Labs  Lab 08/31/23 1856  09/01/23 0450 09/02/23 0324 09/03/23 0118  WBC 9.1 8.6 8.9 9.8  LATICACIDVEN  --   --  1.0  --     Liver Function Tests: Recent Labs  Lab 08/28/23 1440 08/31/23 1856 09/02/23 0424  AST 20 23  --   ALT 16 23  --   ALKPHOS 79 71  --   BILITOT 1.0 0.5  --   PROT 8.1 6.9 5.8*  ALBUMIN 3.6 2.7* 2.2*   Recent Labs  Lab 08/31/23 1856  LIPASE 40   No results for input(s): AMMONIA in the last 168 hours.  ABG    Component Value Date/Time   TCO2 17 (L) 01/14/2022 1453     Coagulation Profile: No results for input(s): INR, PROTIME in the last 168 hours.  Cardiac Enzymes: No results for input(s): CKTOTAL, CKMB, CKMBINDEX, TROPONINI in the last 168 hours.  HbA1C: Hgb A1c MFr Bld  Date/Time Value Ref Range Status  01/15/2022 03:34 AM 5.8 (H) 4.8 - 5.6 % Final    Comment:    (NOTE)         Prediabetes: 5.7 - 6.4         Diabetes: >6.4         Glycemic control for adults with diabetes: <7.0     CBG: Recent Labs  Lab 09/01/23 1541  GLUCAP 164*    Review of Systems:    Positive Symptoms in bold:  Constitutional fevers, chills, weight loss, fatigue, anorexia, malaise  Eyes decreased vision, double vision, eye irritation  Ears, Nose, Mouth, Throat sore throat, trouble swallowing, sinus congestion  Cardiovascular chest pain, paroxysmal nocturnal dyspnea, lower ext edema, palpitations   Respiratory SOB, cough, DOE, hemoptysis, wheezing  Gastrointestinal nausea, vomiting, diarrhea  Genitourinary burning with urination, trouble urinating  Musculoskeletal joint aches, joint swelling, back pain  Integumentary  rashes, skin lesions  Neurological focal weakness, focal numbness, trouble speaking, headaches  Psychiatric depression, anxiety, confusion  Endocrine polyuria, polydipsia, cold intolerance, heat intolerance  Hematologic abnormal bruising, abnormal bleeding, unexplained nose bleeds  Allergic/Immunologic recurrent infections, hives, swollen lymph  nodes     Past Medical History:  He,  has a past medical history of CAD (coronary artery disease), native coronary artery, Carotid artery stenosis, Crohn's disease (HCC), Depression, Dyslipidemia, Dyspnea, ED (erectile dysfunction), History of blood transfusion (2005), Hypertension, Myocardial infarction (HCC) (2005), Nephrolithiasis, Stroke (HCC), TIA (transient ischemic attack), and Ulcerative colitis (HCC).   Surgical History:   Past Surgical History:  Procedure Laterality Date   CARDIAC CATHETERIZATION  06/08   Patnet grafts LAD, marginal, distal right   carpel tunnel surgery Left    COLONOSCOPY     COLONOSCOPY WITH PROPOFOL  N/A 10/25/2012   Procedure: COLONOSCOPY WITH PROPOFOL ;  Surgeon: Gladis POUR  Vicci, MD;  Location: [facility];  Service: Endoscopy;  Laterality: N/A;   CORONARY ARTERY BYPASS GRAFT  2005   ASCVD,multivessel,S/P CABG  x5 total per patient   ENDARTERECTOMY Right 06/24/2016   Procedure: ENDARTERECTOMY CAROTID;  Surgeon: Marea Selinda RAMAN, MD;  Location: [facility];  Service: Vascular;  Laterality: Right;   HERNIA REPAIR  many years ago   IR THORACENTESIS ASP PLEURAL SPACE W/IMG GUIDE  09/02/2023   KNEE ARTHROSCOPY Right 1995     Social History:   reports that he has never smoked. His smokeless tobacco use includes snuff. He reports that he does not drink alcohol and does not use drugs.   Family History:  His family history includes CAD in his brother, brother, brother, father, and sister; Ovarian cancer in his mother. There is no history of Colon cancer, Esophageal cancer, Stomach cancer, Rectal cancer, or Colon polyps.   Allergies No Known Allergies   Home Medications  Prior to Admission medications   Medication Sig Start Date End Date Taking? Authorizing Provider  amLODipine  (NORVASC ) 5 MG tablet Take 1 tablet (5 mg total) by mouth daily. 11/05/22  Yes Lucien Orren SAILOR, PA-C  aspirin  EC 81 MG tablet Take 81 mg by mouth daily. Swallow whole.   Yes [provider]  atorvastatin  (LIPITOR) 80 MG tablet Take 1 tablet (80 mg total) by mouth daily. 05/24/23  Yes Turner, Wilbert SAUNDERS, MD  glucosamine-chondroitin 500-400 MG tablet Take 1 tablet by mouth 2 (two) times daily.   Yes [provider]  HYDROcodone -acetaminophen  (NORCO/VICODIN) 5-325 MG tablet Take 1 tablet by mouth every 8 (eight) hours as needed for severe pain (pain score 7-10). 08/28/23  Yes Charlene Debby BROCKS, PA-C  latanoprost  (XALATAN ) 0.005 % ophthalmic solution Place 1 drop into both eyes at bedtime. 07/19/23  Yes [provider]  lidocaine  (SALONPAS PAIN RELIEVING) 4 % Place 1 patch onto the skin daily.   Yes [provider]  lisinopril  (ZESTRIL ) 20 MG tablet Take 1 tablet (20 mg total) by mouth daily. 03/24/23  Yes Lucien, Tessa N, PA-C  metoprolol  succinate (TOPROL -XL) 25 MG 24 hr tablet Take 0.5 tablets (12.5 mg total) by mouth daily. 03/24/23  Yes Conte, Tessa N, PA-C  nitroGLYCERIN  (NITROSTAT ) 0.4 MG SL tablet Place 1 tablet (0.4 mg total) under the tongue every 5 (five) minutes as needed for chest pain. 03/24/23  Yes Conte, Tessa N, PA-C  predniSONE (STERAPRED UNI-PAK 21 TAB) 10 MG (21) TBPK tablet Take 10 mg by mouth See admin instructions. 08/26/23  Yes [provider]  sildenafil (VIAGRA) 100 MG tablet Take 100 mg by mouth daily as needed for erectile dysfunction. 12/13/21  Yes [provider]  tamsulosin  (FLOMAX ) 0.4 MG CAPS capsule Take 0.4 mg by mouth at bedtime. 07/04/21  Yes [provider]  tiZANidine  (ZANAFLEX ) 4 MG tablet Take 1 tablet (4 mg total) by mouth 3 (three) times daily. 08/28/23  Yes Charlene Debby BROCKS, PA-C  vitamin B-12 (CYANOCOBALAMIN ) 500 MCG tablet Take 1,000 mcg by mouth daily.   Yes [provider]     Critical care time: N/A

## 2023-09-03 NOTE — Care Management Important Message (Signed)
 Important Message  Patient Details  Name: Jacob [name] MRN: [id number] Date of Birth: [birth year]   Important Message Given:  Yes - Medicare IM     [name] 09/03/2023, 2:47 PM

## 2023-09-03 NOTE — Progress Notes (Signed)
 PT Cancellation Note  Patient Details Name: Jacob [name] MRN: [id number] DOB: 06/29/1950   Cancelled Treatment:    Reason Eval/Treat Not Completed: Patient not medically ready  Noted clinical issues this morning, team running multiple urgent tests. Will hold PT follow-up that was scheduled today and follow.   Leontine Roads, PT, DPT [facility]  Rehabilitation Services Physical Therapist Office: [phone] Website: [email or web address]  Leontine GORMAN Roads 09/03/2023, 11:33 AM

## 2023-09-03 NOTE — Progress Notes (Addendum)
 Pharmacy Antibiotic Note  Jacob [name] is a 73 y.o. male admitted on 08/31/2023 with concern forempyema.  Pharmacy has been consulted for Vancomycin  dosing.  Plan: Vancomycin  IV 2000 mg x 1, then 1750 mg IV q24h (eAUC 466, IBW, SCr 1.2) Monitor renal function, clinical status, cultures/sensitivities and ability to narrow  Height: 5' 11 (180.3 cm) Weight: 95.4 kg (210 lb 5.1 oz) IBW/kg (Calculated) : 75.3  Temp (24hrs), Avg:98.3 F (36.8 C), Min:97.8 F (36.6 C), Max:99.1 F (37.3 C)  Recent Labs  Lab 08/28/23 1440 08/31/23 1856 09/01/23 0450 09/02/23 0324 09/03/23 0118  WBC 9.1 9.1 8.6 8.9 9.8  CREATININE 1.10 1.15 1.03 2.03* 1.19  LATICACIDVEN  --   --   --  1.0  --     Estimated Creatinine Clearance: 67.1 mL/min (by C-G formula based on SCr of 1.19 mg/dL).    No Known Allergies  Antimicrobials this admission: Vancomycin  8/15 >> c  Microbiology results: 8/14 pleural fluid: GPC clusters  Thank you for allowing pharmacy to be a part of this patient's care.  Maurilio Fila, PharmD Clinical Pharmacist 09/03/2023  6:15 AM

## 2023-09-03 NOTE — Progress Notes (Signed)
 Progress Note  Patient Name: Jacob [name] Date of Encounter: 09/03/2023  [facility] Cardiologist: Wilbert Bihari, MD   Patient Profile     Subjective   Admitted with A-fib with RVR in the setting of new severe back pain.  Started on IV Cardizem  gtt and BB but stopped due to severe hypotension.  Started IV Amio yesterday for HR control.    Also having severe mid thoracic and lumbar back pain with mild DJD on MRI.  Has had pleuritic right sided CP that has become more severe.  S/p right thoracentesis yesterday for moderate pleural effusion which came back as orange and cloudy in color with increased LDH and neutrophils. Rib films done due to recent falls with no fracture.    This am converted to NSR on IV Amio.  Nurse placed breathing treatment for increase wheezing which is new.  Patient's pleuritic CP on the right is much worse this am and now screaming that he cannot breath due to pain.  EKG showed NSR with no acute ST changes.   Inpatient Medications    Scheduled Meds:  acetaminophen   1,000 mg Oral TID   aspirin  EC  81 mg Oral Daily   atorvastatin   80 mg Oral Daily   feeding supplement  237 mL Oral BID BM   latanoprost   1 drop Both Eyes QHS   lidocaine   1 patch Transdermal Q24H   And   lidocaine   1 patch Transdermal Q24H   polyethylene glycol  17 g Oral Daily   sodium chloride  flush  3 mL Intravenous Q12H   tamsulosin   0.4 mg Oral QHS   Continuous Infusions:  amiodarone  30 mg/hr (09/03/23 0507)   heparin  1,650 Units/hr (09/03/23 0727)   [START ON 09/04/2023] vancomycin      vancomycin  2,000 mg (09/03/23 0813)   PRN Meds: acetaminophen  **OR** acetaminophen , bisacodyl , HYDROmorphone  (DILAUDID ) injection, levalbuterol , methocarbamol  (ROBAXIN ) injection, oxyCODONE , prochlorperazine    Vital Signs    Vitals:   09/02/23 1900 09/02/23 2300 09/03/23 0316 09/03/23 0804  BP: 118/72 (!) 142/85 117/81 123/88  Pulse: (!) 112 86 87 81  Resp: 19 20 (!) 21 16  Temp: 99.1  F (37.3 C) 98.8 F (37.1 C) 97.8 F (36.6 C) 98.2 F (36.8 C)  TempSrc: Oral Oral Oral Oral  SpO2: 92% 94% 92% 93%  Weight:      Height:        Intake/Output Summary (Last 24 hours) at 09/03/2023 0924 Last data filed at 09/03/2023 0507 Gross per 24 hour  Intake 1312.42 ml  Output 925 ml  Net 387.42 ml      09/01/2023   11:18 AM 08/31/2023    6:30 PM 08/28/2023    1:14 PM  Last 3 Weights  Weight (lbs) 210 lb 5.1 oz 202 lb 214 lb  Weight (kg) 95.4 kg 91.627 kg 97.07 kg      Telemetry    Atrial fibrillation with RVR as high as the 160's- Personally Reviewed  ECG    No new EKG to review- Personally Reviewed  Physical Exam   GEN: Well nourished, well developed in severe distress due to pleuritic CP HEENT: Normal NECK: No JVD; No carotid bruits LYMPHATICS: No lymphadenopathy CARDIAC:RRR, no murmurs, rubs, gallops RESPIRATORY:  diffuse scattered wheezes with decreased BS at the bases ABDOMEN: Soft, non-tender, non-distended MUSCULOSKELETAL:  No edema; No deformity  SKIN: Warm and dry NEUROLOGIC:  Alert and oriented x 3 PSYCHIATRIC:  Normal affect  Labs    High Sensitivity Troponin:  Recent Labs  Lab 08/31/23 1856 08/31/23 2012  TROPONINIHS 5 4      Chemistry Recent Labs  Lab 08/28/23 1440 08/31/23 1856 09/01/23 0450 09/02/23 0324 09/02/23 0424 09/03/23 0118  NA 131* 137 135 131*  --  134*  K 3.8 3.9 4.1 3.6  --  3.8  CL 97* 102 103 99  --  99  CO2 22 24 23  21*  --  26  GLUCOSE 146* 108* 97 124*  --  129*  BUN 17 23 19  31*  --  23  CREATININE 1.10 1.15 1.03 2.03*  --  1.19  CALCIUM  8.7* 8.5* 8.2* 7.9*  --  8.2*  PROT 8.1 6.9  --   --  5.8*  --   ALBUMIN 3.6 2.7*  --   --  2.2*  --   AST 20 23  --   --   --   --   ALT 16 23  --   --   --   --   ALKPHOS 79 71  --   --   --   --   BILITOT 1.0 0.5  --   --   --   --   GFRNONAA >60 >60 >60 34*  --  >60  ANIONGAP 12 11 9 11   --  9     Hematology Recent Labs  Lab 09/01/23 0450 09/02/23 0324  09/03/23 0118  WBC 8.6 8.9 9.8  RBC 3.81* 3.46* 3.60*  HGB 11.2* 10.1* 10.5*  HCT 34.0* 30.9* 31.8*  MCV 89.2 89.3 88.3  MCH 29.4 29.2 29.2  MCHC 32.9 32.7 33.0  RDW 13.1 13.2 13.2  PLT 260 214 254    BNP Recent Labs  Lab 08/31/23 2012  BNP 154.1*     DDimer  Recent Labs  Lab 08/31/23 1856  DDIMER 1.50*     CHA2DS2-VASc Score = 5   This indicates a 7.2% annual risk of stroke. The patient's score is based upon: CHF History: 0 HTN History: 1 Diabetes History: 0 Stroke History: 2 Vascular Disease History: 1 Age Score: 1 Gender Score: 0      Radiology    DG Ribs Unilateral W/Chest Right Result Date: 09/02/2023 CLINICAL DATA:  Right-sided chest pain EXAM: RIGHT RIBS AND CHEST - 3+ VIEW COMPARISON:  Chest x-ray 09/02/2023.  Chest 08/31/2023 FINDINGS: No fracture or other bone lesions are seen involving the ribs. Lung volumes are low. There are atelectatic changes in the right lower lung. There is no pleural effusion or pneumothorax. Patient is status post cardiac surgery. Cardiomediastinal silhouette is stable. There is no pneumothorax. IMPRESSION: 1. No evidence of rib fracture. 2. Low lung volumes with atelectatic changes in the right lower lung. Electronically Signed   By: Greig Pique M.D.   On: 09/02/2023 22:57   IR THORACENTESIS ASP PLEURAL SPACE W/IMG GUIDE Result Date: 09/02/2023 INDICATION: Inpatient with new onset Afib with RVR on amiodarone  and acute CHF. Right pleural effusion noted on recent imaging, request for diagnostic and therapeutic thoracentesis. EXAM: ULTRASOUND GUIDED RIGHT THORACENTESIS MEDICATIONS: 10 mL 1% lidocaine  COMPLICATIONS: None immediate. PROCEDURE: An ultrasound guided thoracentesis was thoroughly discussed with the patient and questions answered. The benefits, risks, alternatives and complications were also discussed. The patient understands and wishes to proceed with the procedure. Written consent was obtained. Ultrasound was performed to  localize and mark an adequate pocket of fluid in the right chest. The area was then prepped and draped in the normal sterile fashion. 1% Lidocaine  was used  for local anesthesia. Under ultrasound guidance a 6 Fr Safe-T-Centesis catheter was introduced. Thoracentesis was performed. Patient reported back spasm during exam and requested exam be ended due to pain. The catheter was removed and a dressing applied. CXR ordered post-procedure. FINDINGS: A total of approximately 350 milliliters of amber fluid was removed. Samples were sent to the laboratory as requested by the clinical team. IMPRESSION: Successful ultrasound guided right thoracentesis yielding 350 milliliters of pleural fluid. Performed and dictated by Laymon Coast, NP Electronically Signed   By: Ester Sides M.D.   On: 09/02/2023 13:07   DG Chest Port 1 View Result Date: 09/02/2023 CLINICAL DATA:  Pleural effusion.  Status post right thoracentesis. EXAM: PORTABLE CHEST 1 VIEW COMPARISON:  08/31/2023 FINDINGS: Low volume film. No evidence for pneumothorax. Decrease in right pleural effusion since prior study with persistent bibasilar atelectasis. Gaseous distention of the stomach evident under the left hemidiaphragm. Nodular density noted left lung base with no suspicious pulmonary nodule or mass in this region on CT scan 2 days ago. The cardio pericardial silhouette is enlarged. IMPRESSION: 1. No evidence for pneumothorax after right thoracentesis. 2. Decrease in right pleural effusion with persistent bibasilar atelectasis. Electronically Signed   By: Camellia Candle M.D.   On: 09/02/2023 12:53    Patient Profile     73 y.o. male  with a hx of hypertension, hyperlipidemia, carotid artery stenosis s/p endarterectomy in 2018, prior TIA, prior stroke, Crohn's disease, ulcerative colitis, and multivessel CAD s/p CABG 2005 and PCI to SVG to RI in 2008 who is being seen 09/01/2023 for the evaluation of new onset atrial fibrillation at the request of CHRISTOBAL Guadalajara MD.   Assessment & Plan    #New onset atrial fibrillation with RVR - Patient recently has been having some symptoms of shortness of breath as well as lower extremity edema and cough for a few weeks which may have been when the A-fib started - He is not really aware of palpitations - CHADS2VASC score  5 (hypertension, age> 65, history of CVA, CAD) - IV Cardizem  drip and beta-blockers were stopped last night due to hypotension as low as the 60's systolic - now in severe pain in his back with pleuritic right CP likely and very tachy with afib RVR HR 160's likely driven by his pain - Labs today K+ 3.8  SCr 1.19 - Given his high CHA2DS2-VASc score he is at risk for cardioembolic events if not anticoagulated but I am very concerned about all the falls he is recently had.  Some of these been related to dizziness but others have been related to severe back pain - Currently on IV heparin  drip - 2D echo pending>>will ask echo to do this am.   - TSH normal 1.5 - no PE on Chest CT - converted to NSR on IV Amio - will continue Amio gtt for now until respiratory status more stable - Recommend PT consult to evaluate for risk of falls to determine if he is an adequate candidate for DOAC therapy  #Acute diastolic CHF  #Elevated BNP  #Shortness of breath  #Lower extremity edema  #Severe right sided Pleuritic CP #Moderate Pleural Effusion - He has been having shortness of breath with some lower extremity edema along with cough for few weeks now  - BNP mildly elevated - has had several falls but not on his right side but since admit has had severe pleuritic CP and very tender to palpation over his right rib cage -Chest CT  8/12 with no PE but did showed moderate right pleural effusion>>diff dx includes hemorrhage from rib fx related to recent fall (although no mention on CT of rib fx he is very tender over right rib cage to palpation) vs. CHF from afib with RVR - no evidence of pericardial effusion  or aortic aneurysm/dissection on Chest CT - rib films with no fracture - s/p right thoracentesis yesterday with cloudy orange fluid with LDH 362, increased WBC>>cytology pending - 2D echo to be done today - complains of SEVERE pleuritic CP on the right this am with increased diffuse wheezes.  O2 sats 96% on  - stat EKG shows NSR at 83bpm with nonspecific T wave abnormality>>no acute ST changes - will get stat Chest CT with contrast to reevaluate right lung - I have consulted critical care/CCM for help with management of pleural effusion and worsening respiratory status   #Multiple falls #Dizziness - Unclear how much of this is related to his back issues and how much may be related to true dizziness - 1 of these episodes was related to drinking a 0.7 unintentionally resulting in a seizure  - Another fall occurred while trying to get up off the side of the bed to go to the bathroom but was related to patient's limited mobility due to back pain  - Another episode happened while walking after standing up and then 1 more related to working out in the yard check orthostatic blood pressures - Neurosurgery consult appreciated for acute on chronic low back pain with MRI showing mild generalized spondylolysis and DJD.  Plan is for pain management follow-up outpatient -Will get PT consult   #Acute on chronic back pain - This 2 appears to recently worsened - Neurosurgery has been consulted - reviewed with radiology and no evidence of aortic dissection  - radiology did say that he has cholelithiasis but no evidence of cholecystitis   #ASCAD #Hyperlipidemia -s/p remote history of CABG 2005 -s/p  PCI of SVG to RI in 2008  -Denies any recent anginal symptoms prior to having all the issues with back pain but then started having sharp chest pain in the right chest that seem to radiate from the back into the right chest -He is also noted some shortness of breath with cough over the past few weeks which I  suspect is related to the new A-fib with RVR -High-sensitivity troponin is normal (5>>4) -2D echo pending -Continue aspirin  81 mg daily, atorvastatin  80 mg daily, Toprol -XL 12.5 mg daily   I spent 35 minutes caring for this patient today face to face, ordering and reviewing labs, reviewing records from 2D echo  05/03/23, chest CTA 08/31/23, seeing the patient, documenting in the record, and discussing with hospitalist  For questions or updates, please contact [facility] Please consult [email or web address] for contact info under        Signed, Wilbert Bihari, MD  09/03/2023, 9:24 AM

## 2023-09-03 NOTE — Procedures (Signed)
 Pleural Fibrinolytic Administration Procedure Note  ROME ECHAVARRIA  [id number]  Jul 16, 1950  Date:09/03/23  Time:3:13 PM   Provider Performing:Kenslei Hearty D. Harris   Procedure: Pleural Fibrinolysis Initial day (67438)  Indication(s) Fibrinolysis of complicated pleural effusion  Consent Risks of the procedure as well as the alternatives and risks of each were explained to the patient and/or caregiver.  Consent for the procedure was obtained.   Anesthesia None   Time Out Verified patient identification, verified procedure, site/side was marked, verified correct patient position, special equipment/implants available, medications/allergies/relevant history reviewed, required imaging and test results available.   Sterile Technique Hand hygiene, gloves   Procedure Description Existing pleural catheter was cleaned and accessed in sterile manner.  10mg  of tPA in 30cc of saline and 5mg  of dornase in 30cc of sterile water  were injected into pleural space using existing pleural catheter.  Catheter will be clamped for 1 hour and then placed back to suction.   Complications/Tolerance None; patient tolerated the procedure well.  EBL None   Specimen(s) None  Tiyanna Larcom D. Harris, NP-C [facility] Personal contact information can be found on Amion  If no contact or response made please call [phone] 09/03/2023, 3:13 PM

## 2023-09-03 NOTE — Progress Notes (Signed)
 09/03/2023 CT shows persistent posterior fluid collection.  Review of symptoms maybe this was cause of his unexplained severe back pain (?developing empyema).  Add zosyn .  US  with him laying on side shows very complex pleural space, will place pigtail and if blood large bore.  Rolan Sharps MD PCCM

## 2023-09-04 DIAGNOSIS — I4891 Unspecified atrial fibrillation: Secondary | ICD-10-CM | POA: Diagnosis not present

## 2023-09-04 DIAGNOSIS — I10 Essential (primary) hypertension: Secondary | ICD-10-CM | POA: Diagnosis not present

## 2023-09-04 DIAGNOSIS — J90 Pleural effusion, not elsewhere classified: Secondary | ICD-10-CM | POA: Diagnosis not present

## 2023-09-04 DIAGNOSIS — R899 Unspecified abnormal finding in specimens from other organs, systems and tissues: Secondary | ICD-10-CM | POA: Diagnosis not present

## 2023-09-04 DIAGNOSIS — R0603 Acute respiratory distress: Secondary | ICD-10-CM | POA: Diagnosis not present

## 2023-09-04 LAB — HEPATIC FUNCTION PANEL
ALT: 37 U/L (ref 0–44)
AST: 24 U/L (ref 15–41)
Albumin: 2.1 g/dL — ABNORMAL LOW (ref 3.5–5.0)
Alkaline Phosphatase: 79 U/L (ref 38–126)
Bilirubin, Direct: 0.1 mg/dL (ref 0.0–0.2)
Indirect Bilirubin: 0.4 mg/dL (ref 0.3–0.9)
Total Bilirubin: 0.5 mg/dL (ref 0.0–1.2)
Total Protein: 6.3 g/dL — ABNORMAL LOW (ref 6.5–8.1)

## 2023-09-04 LAB — BASIC METABOLIC PANEL WITH GFR
Anion gap: 11 (ref 5–15)
BUN: 19 mg/dL (ref 8–23)
CO2: 23 mmol/L (ref 22–32)
Calcium: 8.4 mg/dL — ABNORMAL LOW (ref 8.9–10.3)
Chloride: 96 mmol/L — ABNORMAL LOW (ref 98–111)
Creatinine, Ser: 1.29 mg/dL — ABNORMAL HIGH (ref 0.61–1.24)
GFR, Estimated: 59 mL/min — ABNORMAL LOW (ref >60)
Glucose, Bld: 126 mg/dL — ABNORMAL HIGH (ref 70–99)
Potassium: 4.1 mmol/L (ref 3.5–5.1)
Sodium: 130 mmol/L — ABNORMAL LOW (ref 135–145)

## 2023-09-04 LAB — CBC
HCT: 32.7 % — ABNORMAL LOW (ref 39.0–52.0)
Hemoglobin: 10.7 g/dL — ABNORMAL LOW (ref 13.0–17.0)
MCH: 29.3 pg (ref 26.0–34.0)
MCHC: 32.7 g/dL (ref 30.0–36.0)
MCV: 89.6 fL (ref 80.0–100.0)
Platelets: 259 K/uL (ref 150–400)
RBC: 3.65 MIL/uL — ABNORMAL LOW (ref 4.22–5.81)
RDW: 13.3 % (ref 11.5–15.5)
WBC: 10.4 K/uL (ref 4.0–10.5)
nRBC: 0 % (ref 0.0–0.2)

## 2023-09-04 MED ORDER — SODIUM CHLORIDE 0.9 % IV SOLN: INTRAVENOUS | Status: AC

## 2023-09-04 MED ORDER — LIDOCAINE 1% INJECTION FOR CIRCUMCISION
10.0000 mL | INJECTION | Freq: Once | INTRAVENOUS | Status: DC
  Filled 2023-09-04: qty 10

## 2023-09-04 MED ORDER — DOCUSATE SODIUM 100 MG PO CAPS
100.0000 mg | ORAL_CAPSULE | Freq: Two times a day (BID) | ORAL | Status: DC
  Administered 2023-09-04 – 2023-09-06 (×4): 100 mg via ORAL
  Filled 2023-09-04 (×6): qty 1

## 2023-09-04 MED ORDER — GABAPENTIN 100 MG PO CAPS
100.0000 mg | ORAL_CAPSULE | Freq: Three times a day (TID) | ORAL | Status: DC
  Administered 2023-09-04 – 2023-09-10 (×18): 100 mg via ORAL
  Filled 2023-09-04 (×18): qty 1

## 2023-09-04 MED ORDER — HEPARIN SODIUM (PORCINE) 5000 UNIT/ML IJ SOLN
5000.0000 [IU] | Freq: Three times a day (TID) | INTRAMUSCULAR | Status: DC
  Administered 2023-09-04 – 2023-09-05 (×4): 5000 [IU] via SUBCUTANEOUS
  Filled 2023-09-04 (×4): qty 1

## 2023-09-04 MED ORDER — SODIUM CHLORIDE (PF) 0.9 % IJ SOLN
5.0000 mg | Freq: Once | INTRAMUSCULAR | Status: AC
  Administered 2023-09-04: 5 mg via INTRAPLEURAL
  Filled 2023-09-04: qty 5

## 2023-09-04 MED ORDER — STERILE WATER FOR INJECTION IJ SOLN
5.0000 mg | Freq: Once | RESPIRATORY_TRACT | Status: AC
  Administered 2023-09-04: 5 mg via INTRAPLEURAL
  Filled 2023-09-04: qty 5

## 2023-09-04 MED ORDER — HYDROMORPHONE 1 MG/ML IV SOLN
INTRAVENOUS | Status: DC
  Administered 2023-09-05 (×2): 30 mg via INTRAVENOUS
  Administered 2023-09-05: 1 mg via INTRAVENOUS
  Administered 2023-09-06: 1.8 mg via INTRAVENOUS
  Administered 2023-09-06: 2.4 mg via INTRAVENOUS
  Administered 2023-09-06: 0.9 mg via INTRAVENOUS
  Administered 2023-09-06: 0.3 mg via INTRAVENOUS
  Administered 2023-09-07: 1.5 mg via INTRAVENOUS
  Administered 2023-09-07 – 2023-09-08 (×2): 0.9 mg via INTRAVENOUS
  Administered 2023-09-08 (×2): 0.6 mg via INTRAVENOUS
  Administered 2023-09-09: 0.3 mg via INTRAVENOUS
  Administered 2023-09-09: 0.6 mg via INTRAVENOUS
  Filled 2023-09-04: qty 30

## 2023-09-04 MED ORDER — SODIUM CHLORIDE 0.9% FLUSH
10.0000 mL | Freq: Three times a day (TID) | INTRAVENOUS | Status: DC
Start: 2023-09-04 — End: 2023-09-09
  Administered 2023-09-04 – 2023-09-09 (×13): 10 mL via INTRAPLEURAL

## 2023-09-04 MED ORDER — POLYETHYLENE GLYCOL 3350 17 G PO PACK
17.0000 g | PACK | Freq: Two times a day (BID) | ORAL | Status: DC
  Administered 2023-09-04 – 2023-09-06 (×4): 17 g via ORAL
  Filled 2023-09-04 (×7): qty 1

## 2023-09-04 NOTE — Progress Notes (Signed)
 Progress Note  Patient Name: Jacob [name] Date of Encounter: 09/04/2023  [facility] Cardiologist: Wilbert Bihari, MD   Patient Profile     Subjective   Admitted with A-fib with RVR in the setting of new severe back pain.  Started on IV Cardizem  gtt and BB but stopped due to severe hypotension.  Started IV Amio yesterday for HR control.    Also having severe mid thoracic and lumbar back pain with mild DJD on MRI.  Has had pleuritic right sided CP that has become more severe.  S/p right thoracentesis for moderate pleural effusion which came back as orange and cloudy in color with increased LDH and neutrophils. Rib films done due to recent falls with no fracture.    8/15 converted to NSR on IV Amio.  Nurse placed breathing treatment for increase wheezing which is new.  Patient's pleuritic CP became worse. Dilaudid  PCA was started.  Inpatient Medications    Scheduled Meds:  acetaminophen   1,000 mg Oral TID   aspirin  EC  81 mg Oral Daily   atorvastatin   80 mg Oral Daily   Chlorhexidine  Gluconate Cloth  6 each Topical Daily   docusate sodium   100 mg Oral BID   feeding supplement  237 mL Oral BID BM   gabapentin   100 mg Oral Q8H   heparin  injection (subcutaneous)  5,000 Units Subcutaneous Q8H   HYDROmorphone    Intravenous Q4H   latanoprost   1 drop Both Eyes QHS   levalbuterol   0.63 mg Nebulization Q8H   lidocaine   1 patch Transdermal Q24H   And   lidocaine   1 patch Transdermal Q24H   lidocaine   10 mL Other Once   polyethylene glycol  17 g Oral BID   sodium chloride  flush  10 mL Intrapleural Q8H   sodium chloride  flush  10 mL Intrapleural Q8H   sodium chloride  flush  3 mL Intravenous Q12H   tamsulosin   0.4 mg Oral QHS   Continuous Infusions:  sodium chloride  75 mL/hr at 09/04/23 0935   amiodarone  30 mg/hr (09/04/23 0900)   piperacillin -tazobactam (ZOSYN )  IV 12.5 mL/hr at 09/04/23 0900   vancomycin  175 mL/hr at 09/04/23 0900   PRN Meds: bisacodyl , diazepam ,  diphenhydrAMINE  **OR** diphenhydrAMINE , naloxone  **AND** sodium chloride  flush, prochlorperazine    Vital Signs    Vitals:   09/04/23 0753 09/04/23 0756 09/04/23 0800 09/04/23 0900  BP:   111/75 99/66  Pulse:   86 90  Resp: (!) 21  (!) 23 10  Temp:  98.1 F (36.7 C)    TempSrc:  Oral    SpO2: 95%  92% 93%  Weight:      Height:        Intake/Output Summary (Last 24 hours) at 09/04/2023 1034 Last data filed at 09/04/2023 0946 Gross per 24 hour  Intake 972.48 ml  Output 2425 ml  Net -1452.52 ml      09/01/2023   11:18 AM 08/31/2023    6:30 PM 08/28/2023    1:14 PM  Last 3 Weights  Weight (lbs) 210 lb 5.1 oz 202 lb 214 lb  Weight (kg) 95.4 kg 91.627 kg 97.07 kg      Telemetry    Atrial fibrillation with RVR as high as the 160's- Personally Reviewed  ECG    No new EKG to review- Personally Reviewed  Physical Exam   GEN: Well nourished, well developed in severe distress due to pleuritic CP HEENT: Normal NECK: No JVD; No carotid bruits CARDIAC:RRR, no murmurs,  rubs, gallops RESPIRATORY:  diffuse scattered wheezes with decreased BS at the bases NEUROLOGIC:  somnolent but arousable Labs    High Sensitivity Troponin:   Recent Labs  Lab 08/31/23 1856 08/31/23 2012  TROPONINIHS 5 4      Chemistry Recent Labs  Lab 08/28/23 1440 08/31/23 1856 09/01/23 0450 09/02/23 0424 09/03/23 0118 09/03/23 1036 09/04/23 0221  NA 131* 137   < >  --  134* 135 130*  K 3.8 3.9   < >  --  3.8 4.0 4.1  CL 97* 102   < >  --  99 101 96*  CO2 22 24   < >  --  26 22 23   GLUCOSE 146* 108*   < >  --  129* 108* 126*  BUN 17 23   < >  --  23 18 19   CREATININE 1.10 1.15   < >  --  1.19 1.11 1.29*  CALCIUM  8.7* 8.5*   < >  --  8.2* 8.2* 8.4*  PROT 8.1 6.9  --  5.8*  --   --  6.3*  ALBUMIN 3.6 2.7*  --  2.2*  --   --  2.1*  AST 20 23  --   --   --   --  24  ALT 16 23  --   --   --   --  37  ALKPHOS 79 71  --   --   --   --  79  BILITOT 1.0 0.5  --   --   --   --  0.5  GFRNONAA >60 >60    < >  --  >60 >60 59*  ANIONGAP 12 11   < >  --  9 12 11    < > = values in this interval not displayed.     Hematology Recent Labs  Lab 09/03/23 0118 09/03/23 1036 09/04/23 0221  WBC 9.8 9.9 10.4  RBC 3.60* 3.72* 3.65*  HGB 10.5* 10.8* 10.7*  HCT 31.8* 33.8* 32.7*  MCV 88.3 90.9 89.6  MCH 29.2 29.0 29.3  MCHC 33.0 32.0 32.7  RDW 13.2 13.2 13.3  PLT 254 229 259    BNP Recent Labs  Lab 08/31/23 2012  BNP 154.1*     DDimer  Recent Labs  Lab 08/31/23 1856  DDIMER 1.50*     CHA2DS2-VASc Score = 5   This indicates a 7.2% annual risk of stroke. The patient's score is based upon: CHF History: 0 HTN History: 1 Diabetes History: 0 Stroke History: 2 Vascular Disease History: 1 Age Score: 1 Gender Score: 0      Radiology    ECHOCARDIOGRAM COMPLETE Result Date: 09/03/2023    ECHOCARDIOGRAM REPORT   Patient Name:   TSUTOMU BARFOOT Date of Exam: 09/03/2023 Medical Rec #:  [id number]         Height:       71.0 in Accession #:    [id number]        Weight:       210.3 lb Date of Birth:  06/01/1950         BSA:          2.154 m Patient Age:    73 years          BP:           123/85 mmHg Patient Gender: M                 HR:  87 bpm. Exam Location:  Inpatient Procedure: 2D Echo, Color Doppler and Cardiac Doppler (Both Spectral and Color            Flow Doppler were utilized during procedure). Indications:    A fib  History:        Patient has prior history of Echocardiogram examinations, most                 recent 05/03/2023. CAD.  Sonographer:    Benard Stallion Referring Phys: [id number] RAMESH KC IMPRESSIONS  1. Left ventricular ejection fraction, by estimation, is 55 to 60%. The left ventricle has normal function. Left ventricular diastolic parameters were normal.  2. Right ventricular systolic function is normal. The right ventricular size is normal.  3. Mild mitral valve regurgitation.  4. The aortic valve is tricuspid. Aortic valve regurgitation is trivial. Aortic  valve sclerosis/calcification is present, without any evidence of aortic stenosis. FINDINGS  Left Ventricle: Left ventricular ejection fraction, by estimation, is 55 to 60%. The left ventricle has normal function. The left ventricular internal cavity size was normal in size. There is no left ventricular hypertrophy. Left ventricular diastolic parameters were normal. Right Ventricle: The right ventricular size is normal. Right vetricular wall thickness was not assessed. Right ventricular systolic function is normal. Left Atrium: Left atrial size was normal in size. Right Atrium: Right atrial size was normal in size. Pericardium: There is no evidence of pericardial effusion. Mitral Valve: There is mild thickening of the mitral valve leaflet(s). Mild mitral valve regurgitation. Tricuspid Valve: The tricuspid valve is normal in structure. Tricuspid valve regurgitation is mild. Aortic Valve: The aortic valve is tricuspid. Aortic valve regurgitation is trivial. Aortic valve sclerosis/calcification is present, without any evidence of aortic stenosis. Aortic valve mean gradient measures 3.0 mmHg. Aortic valve peak gradient measures 5.9 mmHg. Aortic valve area, by VTI measures 3.23 cm. Pulmonic Valve: The pulmonic valve was normal in structure. Pulmonic valve regurgitation is not visualized. Aorta: The aortic root is normal in size and structure. IAS/Shunts: No atrial level shunt detected by color flow Doppler.  LEFT VENTRICLE PLAX 2D LVIDd:         3.80 cm   Diastology LVIDs:         2.66 cm   LV e' medial:    10.10 cm/s LV PW:         1.00 cm   LV E/e' medial:  10.0 LV IVS:        1.04 cm   LV e' lateral:   15.70 cm/s LVOT diam:     2.20 cm   LV E/e' lateral: 6.4 LV SV:         58 LV SV Index:   27 LVOT Area:     3.80 cm  RIGHT VENTRICLE RV S prime:     18.50 cm/s TAPSE (M-mode): 1.6 cm LEFT ATRIUM             Index        RIGHT ATRIUM          Index LA diam:        4.48 cm 2.08 cm/m   RA Area:     9.25 cm LA Vol  (A2C):   61.5 ml 28.55 ml/m  RA Volume:   14.40 ml 6.68 ml/m LA Vol (A4C):   57.8 ml 26.83 ml/m LA Biplane Vol: 62.1 ml 28.83 ml/m  AORTIC VALVE AV Area (Vmax):    3.14 cm AV Area (Vmean):   2.99 cm  AV Area (VTI):     3.23 cm AV Vmax:           121.00 cm/s AV Vmean:          81.300 cm/s AV VTI:            0.179 m AV Peak Grad:      5.9 mmHg AV Mean Grad:      3.0 mmHg LVOT Vmax:         100.00 cm/s LVOT Vmean:        63.900 cm/s LVOT VTI:          0.152 m LVOT/AV VTI ratio: 0.85  AORTA Ao Root diam: 3.52 cm MITRAL VALVE MV Area (PHT): 3.77 cm     SHUNTS MV Decel Time: 201 msec     Systemic VTI:  0.15 m MV E velocity: 101.00 cm/s  Systemic Diam: 2.20 cm MV A velocity: 94.30 cm/s MV E/A ratio:  1.07 Vina Gull MD Electronically signed by Vina Gull MD Signature Date/Time: 09/03/2023/4:13:37 PM    Final    DG Chest Port 1 View Result Date: 09/03/2023 CLINICAL DATA:  Chest tube in place. EXAM: PORTABLE CHEST 1 VIEW COMPARISON:  Radiograph and CT earlier today FINDINGS: Right pigtail catheter coiled in the medial lower hemithorax. Diminished right pleural effusion. Persistent fluid tracking into the inter lobar fissure low lung volumes limit assessment. Stable heart size and mediastinal contours. Bronchovascular crowding related to low lung volumes. No visible pneumothorax. IMPRESSION: Right pigtail catheter in place with diminished right pleural effusion. Persistent small effusion and fluid tracking into the inter lobar fissure. No visible pneumothorax. Electronically Signed   By: Andrea Gasman M.D.   On: 09/03/2023 15:47   CT Angio Chest/Abd/Pel for Dissection W and/or W/WO Result Date: 09/03/2023 EXAM: CTA CHEST, ABDOMEN AND PELVIS WITHOUT AND WITH CONTRAST 09/03/2023 10:52:01 AM TECHNIQUE: CTA of the chest was performed without and with the administration of intravenous contrast. CTA of the abdomen and pelvis was performed without and with the administration of intravenous contrast. Multiplanar  reformatted images are provided for review. MIP images are provided for review. Automated exposure control, iterative reconstruction, and/or weight based adjustment of the mA/kV was utilized to reduce the radiation dose to as low as reasonably achievable. COMPARISON: AP radiograph of the chest dated 09/03/2023 and CT of the abdomen and pelvis dated 08/31/2023 and CT angiogram of the chest dated 08/31/2023. CLINICAL HISTORY: Chest pain, nonspecific. FINDINGS: VASCULATURE: AORTA: No acute finding. No abdominal aortic aneurysm. No dissection. PULMONARY ARTERIES: No pulmonary embolism with the limits of this exam. The main pulmonary artery is mildly prominent. GREAT VESSELS OF AORTIC ARCH: No acute finding. No dissection. No arterial occlusion or significant stenosis. CELIAC TRUNK: No acute finding. No occlusion or significant stenosis. SUPERIOR MESENTERIC ARTERY: No acute finding. No occlusion or significant stenosis. INFERIOR MESENTERIC ARTERY: No acute finding. No occlusion or significant stenosis. RENAL ARTERIES: No acute finding. No occlusion or significant stenosis. ILIAC ARTERIES: No acute finding. No occlusion or significant stenosis. CHEST: MEDIASTINUM: The heart is mildly enlarged and there is moderate calcific coronary artery disease present. No mediastinal lymphadenopathy. The pericardium demonstrates no acute abnormality. LUNGS AND PLEURA: There are hazy and streaky opacities present dependently within the lungs bilaterally, worse on the left. There is a mild-to-moderate right-sided pleural effusion. No focal consolidation or pulmonary edema. No evidence of pneumothorax. THORACIC BONES AND SOFT TISSUES: No acute bone or soft tissue abnormality. ABDOMEN AND PELVIS: LIVER: The liver is unremarkable. GALLBLADDER AND  BILE DUCTS: There are numerous stones laying dependently within the gallbladder. No biliary ductal dilatation. SPLEEN: The spleen is unremarkable. PANCREAS: The pancreas is unremarkable. ADRENAL  GLANDS: Bilateral adrenal glands demonstrate no acute abnormality. KIDNEYS, URETERS AND BLADDER: No stones in the kidneys or ureters. No hydronephrosis. No perinephric or periureteral stranding. Urinary bladder is unremarkable. There is a simple cyst arising anteriorly from the left kidney, which does not require follow up. GI AND BOWEL: Stomach and duodenal sweep demonstrate no acute abnormality. There is no bowel obstruction. No abnormal bowel wall thickening or distension. REPRODUCTIVE: Reproductive organs are unremarkable. PERITONEUM AND RETROPERITONEUM: No ascites or free air. LYMPH NODES: No lymphadenopathy. ABDOMINAL BONES AND SOFT TISSUES: No acute abnormality of the bones. No acute soft tissue abnormality. IMPRESSION: 1. No evidence of pulmonary embolism. 2. Mildly enlarged heart with moderate calcific coronary artery disease. 3. Bibasilar dependent atelectasis, worse on the right and mild-to-moderate right-sided pleural effusion. 4. Numerous stones laying dependently within the gallbladder. Electronically signed by: evalene berrigan 09/03/2023 11:20 AM EDT RP Workstation: [id number]   DG Abd 1 View Result Date: 09/03/2023 CLINICAL DATA:  10323 Abdominal distension [id number] EXAM: ABDOMEN - 1 VIEW COMPARISON:  August 28, 2023 FINDINGS: Nonobstructive bowel gas pattern. Moderate volume fecal loading throughout the ascending colon. Diffuse gaseous distention of the remaining colon. No pneumoperitoneum. No organomegaly or radiopaque calculi. No acute fracture or destructive lesion. Multilevel thoracolumbar osteophytosis. IMPRESSION: Moderate volume fecal loading throughout the ascending colon with progressive, diffuse gaseous distension of the remaining colon, worrisome for constipation and ileus. Otherwise, no findings of small bowel obstruction. Electronically Signed   By: Rogelia Myers M.D.   On: 09/03/2023 10:23   DG CHEST PORT 1 VIEW Result Date: 09/03/2023 CLINICAL DATA:  13927 Pleuritic chest pain  13927 EXAM: PORTABLE CHEST - 1 VIEW COMPARISON:  09/02/2023 FINDINGS: Redemonstrated low lung volumes. Patchy, nodular airspace opacities in the right lung base with blunting of the right costophrenic sulcus. Streaky atelectasis in the left lung base. Mild cardiomegaly. Sternotomy wires and CABG markers. Tortuous aorta with aortic atherosclerosis. No acute fracture or destructive lesions. Multilevel thoracic osteophytosis. IMPRESSION: Low lung volumes. Small right pleural effusion with patchy, nodular opacities in the right lung base, likely atelectasis. Electronically Signed   By: Rogelia Myers M.D.   On: 09/03/2023 10:11   DG Ribs Unilateral W/Chest Right Result Date: 09/02/2023 CLINICAL DATA:  Right-sided chest pain EXAM: RIGHT RIBS AND CHEST - 3+ VIEW COMPARISON:  Chest x-ray 09/02/2023.  Chest 08/31/2023 FINDINGS: No fracture or other bone lesions are seen involving the ribs. Lung volumes are low. There are atelectatic changes in the right lower lung. There is no pleural effusion or pneumothorax. Patient is status post cardiac surgery. Cardiomediastinal silhouette is stable. There is no pneumothorax. IMPRESSION: 1. No evidence of rib fracture. 2. Low lung volumes with atelectatic changes in the right lower lung. Electronically Signed   By: Greig Pique M.D.   On: 09/02/2023 22:57   IR THORACENTESIS ASP PLEURAL SPACE W/IMG GUIDE Result Date: 09/02/2023 INDICATION: Inpatient with new onset Afib with RVR on amiodarone  and acute CHF. Right pleural effusion noted on recent imaging, request for diagnostic and therapeutic thoracentesis. EXAM: ULTRASOUND GUIDED RIGHT THORACENTESIS MEDICATIONS: 10 mL 1% lidocaine  COMPLICATIONS: None immediate. PROCEDURE: An ultrasound guided thoracentesis was thoroughly discussed with the patient and questions answered. The benefits, risks, alternatives and complications were also discussed. The patient understands and wishes to proceed with the procedure. Written consent was  obtained. Ultrasound was performed to  localize and mark an adequate pocket of fluid in the right chest. The area was then prepped and draped in the normal sterile fashion. 1% Lidocaine  was used for local anesthesia. Under ultrasound guidance a 6 Fr Safe-T-Centesis catheter was introduced. Thoracentesis was performed. Patient reported back spasm during exam and requested exam be ended due to pain. The catheter was removed and a dressing applied. CXR ordered post-procedure. FINDINGS: A total of approximately 350 milliliters of amber fluid was removed. Samples were sent to the laboratory as requested by the clinical team. IMPRESSION: Successful ultrasound guided right thoracentesis yielding 350 milliliters of pleural fluid. Performed and dictated by Laymon Coast, NP Electronically Signed   By: Ester Sides M.D.   On: 09/02/2023 13:07   DG Chest Port 1 View Result Date: 09/02/2023 CLINICAL DATA:  Pleural effusion.  Status post right thoracentesis. EXAM: PORTABLE CHEST 1 VIEW COMPARISON:  08/31/2023 FINDINGS: Low volume film. No evidence for pneumothorax. Decrease in right pleural effusion since prior study with persistent bibasilar atelectasis. Gaseous distention of the stomach evident under the left hemidiaphragm. Nodular density noted left lung base with no suspicious pulmonary nodule or mass in this region on CT scan 2 days ago. The cardio pericardial silhouette is enlarged. IMPRESSION: 1. No evidence for pneumothorax after right thoracentesis. 2. Decrease in right pleural effusion with persistent bibasilar atelectasis. Electronically Signed   By: Camellia Candle M.D.   On: 09/02/2023 12:53    Patient Profile     73 y.o. male  with a hx of hypertension, hyperlipidemia, carotid artery stenosis s/p endarterectomy in 2018, prior TIA, prior stroke, Crohn's disease, ulcerative colitis, and multivessel CAD s/p CABG 2005 and PCI to SVG to [place] in 2008 who is being seen 09/01/2023 for the evaluation of new onset  atrial fibrillation at the request of CHRISTOBAL Guadalajara MD.   Assessment & Plan    #New onset atrial fibrillation with RVR - Patient recently has been having some symptoms of shortness of breath as well as lower extremity edema and cough for a few weeks which may have been when the A-fib started - He is not really aware of palpitations - CHADS2VASC score  5 (hypertension, age> 65, history of CVA, CAD) - IV Cardizem  drip and beta-blockers were stopped due to hypotension as low as the 60's systolic - now on dilaudid  PCA for pleuritic pain - Given his high CHA2DS2-VASc score he is at risk for cardioembolic events. Need for anticoagulation is tempered by a history of frequent falls - Currently on IV heparin  drip - 2D echo -- mild MR, normal biatrial size.   - TSH normal 1.5 - no PE on Chest CT - converted to NSR on IV Amio - will continue Amio gtt for now plan to switch to PO - Recommend PT consult to evaluate for risk of falls to determine if he is an adequate candidate for DOAC therapy  #Acute diastolic CHF  #Elevated BNP  #Shortness of breath  #Lower extremity edema  #Severe right sided Pleuritic CP #Moderate Pleural Effusion - He has been having shortness of breath with some lower extremity edema along with cough for few weeks now  - BNP mildly elevated - has had several falls but not on his right side but since admit has had severe pleuritic CP and very tender to palpation over his right rib cage -Chest CT  8/12 with no PE but did showed moderate right pleural effusion>>diff dx includes hemorrhage from rib fx related to recent fall (although no  mention on CT of rib fx he is very tender over right rib cage to palpation) vs. CHF from afib with RVR - no evidence of pericardial effusion or aortic aneurysm/dissection on Chest CT - rib films with no fracture - s/p right thoracentesis with cloudy orange fluid with LDH 362, increased WBC>>cytology pending    #Multiple falls #Dizziness - Unclear  how much of this is related to his back issues and how much may be related to true dizziness - 1 of these episodes was related to drinking a 0.7 unintentionally resulting in a seizure  - Another fall occurred while trying to get up off the side of the bed to go to the bathroom but was related to patient's limited mobility due to back pain  - Another episode happened while walking after standing up and then 1 more related to working out in the yard check orthostatic blood pressures - Neurosurgery consult appreciated for acute on chronic low back pain with MRI showing mild generalized spondylolysis and DJD.  Plan is for pain management follow-up outpatient    #Acute on chronic back pain - This 2 appears to recently worsened - Neurosurgery has been consulted - reviewed with radiology and no evidence of aortic dissection  - radiology did say that he has cholelithiasis but no evidence of cholecystitis   #ASCAD #Hyperlipidemia -s/p remote history of CABG 2005 -s/p  PCI of SVG to [place] in 2008  -Denies any recent anginal symptoms prior to having all the issues with back pain but then started having sharp chest pain in the right chest that seem to radiate from the back into the right chest -High-sensitivity troponin is normal (5>>4) -2D echo ok -Continue aspirin  81 mg daily, atorvastatin  80 mg daily, Toprol -XL 12.5 mg daily     For questions or updates, please contact [facility] Please consult [email or web address] for contact info under        Signed, Eulas FORBES Furbish, MD  09/04/2023, 10:34 AM

## 2023-09-04 NOTE — Plan of Care (Signed)

## 2023-09-04 NOTE — Progress Notes (Signed)
 Cath flo and chest tube clamped at 1020 by Dr. Theophilus, and chest tube unclamped at 1120 and placed back to -20 cm suction per order with serosanguineous drainage noted.

## 2023-09-04 NOTE — Procedures (Signed)
 Pleural Fibrinolytic Administration Procedure Note  Jacob [name]  [id number]  04-26-1950  Date:09/04/23  Time:10:23 AM   Provider Performing:Edinson Domeier   Procedure: Pleural Fibrinolysis Subsequent day [phone])  Indication(s) Fibrinolysis of complicated pleural effusion  Consent Risks of the procedure as well as the alternatives and risks of each were explained to the patient and/or caregiver.  Consent for the procedure was obtained.   Anesthesia None   Time Out Verified patient identification, verified procedure, site/side was marked, verified correct patient position, special equipment/implants available, medications/allergies/relevant history reviewed, required imaging and test results available.   Sterile Technique Hand hygiene, gloves   Procedure Description Existing pleural catheter was cleaned and accessed in sterile manner.  5mg  of tPA in 30cc of saline and 5mg  of dornase in 30cc of sterile water  were injected into pleural space using existing pleural catheter.  Catheter will be clamped for 1 hour and then placed back to suction.   Complications/Tolerance None; patient tolerated the procedure well.   EBL None   Specimen(s) None  Lonna Coder MD [facility] See Amion for pager  If no response to pager , please call [phone] until 7pm After 7:00 pm call Elink  [phone] 09/04/2023, 10:23 AM

## 2023-09-04 NOTE — Progress Notes (Signed)
 NAME:  Jacob [name], MRN:  [id number], DOB:  04/16/1950, LOS: 4 ADMISSION DATE:  08/31/2023, CONSULTATION DATE:  09/03/23 REFERRING MD:  Shlomo, CHIEF COMPLAINT:  back pain   History of Present Illness:  73 year old man w/ CAD, previously functional presenting with 1 month of worsening back pain and multiple falls.  Workup to date neg.  This time coming in with associated afib/RVR so admitted.  Imaging revealed R pleural effusion and this was drained yesterday.  This am began having R pleurisy associated with ongoing back pain and increasing dyspnea.  PCCM consulted for admission due to ongoing worsening dyspnea.    Pertinent  Medical History   Past Medical History:  Diagnosis Date   CAD (coronary artery disease), native coronary artery     multivessel ASCAD s/p CABG and s/p PCI of SVG to RI 2008   Carotid artery stenosis    patent right CEA site and 1-39% Lcarotid stenosis   Crohn's disease (HCC)    flare 2000   Depression    Dyslipidemia    Dyspnea    ED (erectile dysfunction)    History of blood transfusion 2005   Hypertension    Myocardial infarction [facility]) 2005   5 bypasses   Nephrolithiasis    Stroke (HCC)    TIA- Endartarectemy R side   TIA (transient ischemic attack)    Ulcerative colitis (HCC)    Left sided     Significant Hospital Events: Including procedures, antibiotic start and stop dates in addition to other pertinent events   8/14 admit 8/14: Chest tube placed   Interim History / Subjective:  No events overnight.   Objective    Blood pressure 116/76, pulse 78, temperature 98.9 F (37.2 C), temperature source Oral, resp. rate 18, height 5' 11 (1.803 m), weight 95.4 kg, SpO2 96%.    FiO2 (%):  [28 %] 28 %   Intake/Output Summary (Last 24 hours) at 09/04/2023 0755 Last data filed at 09/04/2023 0700 Gross per 24 hour  Intake 490.47 ml  Output 1575 ml  Net -1084.53 ml   Filed Weights   08/31/23 1830 09/01/23 1118  Weight: 91.6 kg 95.4 kg     Examination: General: Laying in bed , eating his breakfast, NAD Lungs: diminshed bases, stridorous breath sound more apically that ease up as patient calms Cardiovascular: regular and maybe in sinus on monitor? Abdomen: Soft non tender  Extremities: warm Neuro: moves everything Skin: no rashes   Resolved problem list   Assessment and Plan  #Atrial fibrillation with RVR  - New on this admission - On Amiodarone  drip - Reverted back to sinus  - ECHO negative  - May need CCB or BB on discharge  - Out patient Cardiology follow up is indicated   #Acute hypoxic resp Failure likely due to pain and Pleural effusion #Pleural Effusion  - Chest tube placed on 8/15 - Draining bloody fluid this morning - Will hold tPa and dornase  - Dyspnea is improved markedly . Now on 3L [place] sating well - PT/OT  CAD  - Continue Asprin and Lipitor  AKI - Resolved   #Acute on Chronic back pain  #Right sided pleuritic chest pain  - May have been exacerbated by the chest tube placement - Continue PCA   Anemia - Hgb stable at 10.7 - Continue to monitor  Labs   CBC: Recent Labs  Lab 08/31/23 1856 09/01/23 0450 09/02/23 0324 09/03/23 0118 09/03/23 1036 09/04/23 0221  WBC 9.1 8.6 8.9  9.8 9.9 10.4  NEUTROABS 6.7  --   --   --   --   --   HGB 11.3* 11.2* 10.1* 10.5* 10.8* 10.7*  HCT 34.3* 34.0* 30.9* 31.8* 33.8* 32.7*  MCV 89.8 89.2 89.3 88.3 90.9 89.6  PLT 267 260 214 254 229 259    Basic Metabolic Panel: Recent Labs  Lab 08/31/23 1856 09/01/23 0450 09/02/23 0324 09/03/23 0118 09/03/23 1036 09/04/23 0221  NA 137 135 131* 134* 135 130*  K 3.9 4.1 3.6 3.8 4.0 4.1  CL 102 103 99 99 101 96*  CO2 24 23 21* 26 22 23   GLUCOSE 108* 97 124* 129* 108* 126*  BUN 23 19 31* 23 18 19   CREATININE 1.15 1.03 2.03* 1.19 1.11 1.29*  CALCIUM  8.5* 8.2* 7.9* 8.2* 8.2* 8.4*  MG 2.0 1.9  --   --   --   --    GFR: Estimated Creatinine Clearance: 61.9 mL/min (A) (by C-G formula based on SCr of  1.29 mg/dL (H)). Recent Labs  Lab 09/02/23 0324 09/03/23 0118 09/03/23 1036 09/04/23 0221  PROCALCITON  --   --  0.22  --   WBC 8.9 9.8 9.9 10.4  LATICACIDVEN 1.0  --  0.8  --     Liver Function Tests: Recent Labs  Lab 08/28/23 1440 08/31/23 1856 09/02/23 0424 09/04/23 0221  AST 20 23  --  24  ALT 16 23  --  37  ALKPHOS 79 71  --  79  BILITOT 1.0 0.5  --  0.5  PROT 8.1 6.9 5.8* 6.3*  ALBUMIN 3.6 2.7* 2.2* 2.1*   Recent Labs  Lab 08/31/23 1856  LIPASE 40   No results for input(s): AMMONIA in the last 168 hours.  ABG    Component Value Date/Time   TCO2 17 (L) 01/14/2022 1453     Coagulation Profile: No results for input(s): INR, PROTIME in the last 168 hours.  Cardiac Enzymes: No results for input(s): CKTOTAL, CKMB, CKMBINDEX, TROPONINI in the last 168 hours.  HbA1C: Hgb A1c MFr Bld  Date/Time Value Ref Range Status  01/15/2022 03:34 AM 5.8 (H) 4.8 - 5.6 % Final    Comment:    (NOTE)         Prediabetes: 5.7 - 6.4         Diabetes: >6.4         Glycemic control for adults with diabetes: <7.0     CBG: Recent Labs  Lab 09/01/23 1541 09/03/23 1301  GLUCAP 164* 104*    Review of Systems:    Positive Symptoms in bold:  Constitutional fevers, chills, weight loss, fatigue, anorexia, malaise  Eyes decreased vision, double vision, eye irritation  Ears, Nose, Mouth, Throat sore throat, trouble swallowing, sinus congestion  Cardiovascular chest pain, paroxysmal nocturnal dyspnea, lower ext edema, palpitations   Respiratory SOB, cough, DOE, hemoptysis, wheezing  Gastrointestinal nausea, vomiting, diarrhea  Genitourinary burning with urination, trouble urinating  Musculoskeletal joint aches, joint swelling, back pain  Integumentary  rashes, skin lesions  Neurological focal weakness, focal numbness, trouble speaking, headaches  Psychiatric depression, anxiety, confusion  Endocrine polyuria, polydipsia, cold intolerance, heat intolerance   Hematologic abnormal bruising, abnormal bleeding, unexplained nose bleeds  Allergic/Immunologic recurrent infections, hives, swollen lymph nodes     Past Medical History:  He,  has a past medical history of CAD (coronary artery disease), native coronary artery, Carotid artery stenosis, Crohn's disease (HCC), Depression, Dyslipidemia, Dyspnea, ED (erectile dysfunction), History of blood transfusion (2005), Hypertension,  Myocardial infarction (HCC) (2005), Nephrolithiasis, Stroke [facility]), TIA (transient ischemic attack), and Ulcerative colitis (HCC).   Surgical History:   Past Surgical History:  Procedure Laterality Date   CARDIAC CATHETERIZATION  06/08   Patnet grafts LAD, marginal, distal right   carpel tunnel surgery Left    COLONOSCOPY     COLONOSCOPY WITH PROPOFOL  N/A 10/25/2012   Procedure: COLONOSCOPY WITH PROPOFOL ;  Surgeon: Gladis MARLA Louder, MD;  Location: [facility];  Service: Endoscopy;  Laterality: N/A;   CORONARY ARTERY BYPASS GRAFT  2005   ASCVD,multivessel,S/P CABG  x5 total per patient   ENDARTERECTOMY Right 06/24/2016   Procedure: ENDARTERECTOMY CAROTID;  Surgeon: Marea Selinda RAMAN, MD;  Location: [facility] ORS;  Service: Vascular;  Laterality: Right;   HERNIA REPAIR  many years ago   IR THORACENTESIS ASP PLEURAL SPACE W/IMG GUIDE  09/02/2023   KNEE ARTHROSCOPY Right 1995     Social History:   reports that he has never smoked. His smokeless tobacco use includes snuff. He reports that he does not drink alcohol and does not use drugs.   Family History:  His family history includes CAD in his brother, brother, brother, father, and sister; Ovarian cancer in his mother. There is no history of Colon cancer, Esophageal cancer, Stomach cancer, Rectal cancer, or Colon polyps.   Allergies No Known Allergies   Home Medications  Prior to Admission medications   Medication Sig Start Date End Date Taking? Authorizing Provider  amLODipine  (NORVASC ) 5 MG tablet Take 1 tablet (5 mg total) by  mouth daily. 11/05/22  Yes Lucien Orren SAILOR, PA-C  aspirin  EC 81 MG tablet Take 81 mg by mouth daily. Swallow whole.   Yes [provider]  atorvastatin  (LIPITOR) 80 MG tablet Take 1 tablet (80 mg total) by mouth daily. 05/24/23  Yes Turner, Wilbert SAUNDERS, MD  glucosamine-chondroitin 500-400 MG tablet Take 1 tablet by mouth 2 (two) times daily.   Yes [provider]  HYDROcodone -acetaminophen  (NORCO/VICODIN) 5-325 MG tablet Take 1 tablet by mouth every 8 (eight) hours as needed for severe pain (pain score 7-10). 08/28/23  Yes Charlene Debby BROCKS, PA-C  latanoprost  (XALATAN ) 0.005 % ophthalmic solution Place 1 drop into both eyes at bedtime. 07/19/23  Yes [provider]  lidocaine  (SALONPAS PAIN RELIEVING) 4 % Place 1 patch onto the skin daily.   Yes [provider]  lisinopril  (ZESTRIL ) 20 MG tablet Take 1 tablet (20 mg total) by mouth daily. 03/24/23  Yes Lucien Orren SAILOR, PA-C  metoprolol  succinate (TOPROL -XL) 25 MG 24 hr tablet Take 0.5 tablets (12.5 mg total) by mouth daily. 03/24/23  Yes Conte, Tessa N, PA-C  nitroGLYCERIN  (NITROSTAT ) 0.4 MG SL tablet Place 1 tablet (0.4 mg total) under the tongue every 5 (five) minutes as needed for chest pain. 03/24/23  Yes Conte, Tessa N, PA-C  predniSONE (STERAPRED UNI-PAK 21 TAB) 10 MG (21) TBPK tablet Take 10 mg by mouth See admin instructions. 08/26/23  Yes [provider]  sildenafil (VIAGRA) 100 MG tablet Take 100 mg by mouth daily as needed for erectile dysfunction. 12/13/21  Yes [provider]  tamsulosin  (FLOMAX ) 0.4 MG CAPS capsule Take 0.4 mg by mouth at bedtime. 07/04/21  Yes [provider]  tiZANidine  (ZANAFLEX ) 4 MG tablet Take 1 tablet (4 mg total) by mouth 3 (three) times daily. 08/28/23  Yes Charlene Debby BROCKS, PA-C  vitamin B-12 (CYANOCOBALAMIN ) 500 MCG tablet Take 1,000 mcg by mouth daily.   Yes [provider]  Critical care time: N/A

## 2023-09-04 NOTE — Progress Notes (Signed)
 PROGRESS NOTE    Jacob [name]  FMW:[id number] DOB: 29-Oct-1950 DOA: 08/31/2023 PCP: [name], MD   Brief Narrative: Jacob [name] is a 73 y.o. male with a history of CAD s/p CABG, hyperlipidemia, hypertension, Crohn's disease, chronic low back pain.  Patient presented secondary to worsening back pain and right-sided chest pain. During hospitalization, patient developed atrial fibrillation with RVR, which was complicated by hypotension on diltiazem  drip. Patient's pain has been difficult to control. Worsening respiratory status. Pleural fluid concerning for possible empyema.   Assessment and Plan:  Acute on chronic low back pain History of multiple falls. MRI obtained and was significant for multilevel degenerative disc disease with foraminal/extraforaminal disc protrusions with resultant possible nerve root impingement; also with multilevel spinal stenosis and transitional lumbosacral anatomy. Neurosurgery was consulted and recommended no inpatient management and for outpatient pain management referral. Patient states they will follow-up with Dr. Louis, neurosurgery, as an outpatient. -Continue PT/OT -LSO brace while ambulating  Right sided pleuritic chest pain Point tenderness around the sixth rib. No associated known falls. CT imaging without evidence of fracture. Pleural effusion noted on that side, however, so injury could still be a possibility. Pain does not wrap around chest, so unlikely nerve related. Patient does have cholelithiasis, but I don't believe referred pain would manifest at his current area of pain. PCA pump started by PCCM -Continue analgesics -Continue PCA pump; continuous rate added by PCCM  Right moderate pleural effusion Unclear etiology. Patient with a history of multiple falls so possibly inflammatory and related to injury. No symptoms concerning for infection, however gram stain significant for GPCs in clusters. Fluid analysis significant for  orange/cloudy fluid and significant WBC concerning for possibly chylous fluid. Triglycerides added. Fluid is exudative based on Light's Criteria. Pulmonology on board. CT chest abdomen/pelvis significant for no dissection and no PE but did show reaccumulation of right pleural fluid. PCCM admitted patient to the ICU and placed a right chest tube; Zosyn  added -Empiric Vancomycin  pending culture data -Follow-up pleural fluid culture and cytology -Follow-up pleural fluid triglycerides -PCCM recommendations: continue chest tube, transfer out of the ICU   Atrial fibrillation with RVR New diagnosis. Cardiology consulted. Patient started on diltiazem  IV but developed subsequent hypotension. Patient with persistent RVR at this time. Heparin  IV started. Patient converted to sinus rhythm on 8/15. -Cardiology recommendations: amiodarone , heparin  IV  Wheezing Patient with development of significant atelectasis likely secondary to splinting. -Albuterol as needed  AKI Likely ATN related to hypotension from diltiazem  IV drip. Resolved. -IV fluids  Carotid artery stenosis Noted. Patient is s/p history of endarterectomy. -Continue aspirin  -Continue Lipitor  Constipation Secondary to narcotic use. -MiraLAX  -Colate  History of crohn's disease Noted.  Asymptomatic cholelithiasis Noted on ultrasound.   CAD No evidence of angina. Patient was on metoprolol  which is held. -Continue aspirin  and Lipitor  Primary hypertension Patient was managed on lisinopril  and amlodipine  which were discontinued after starting Cardizem .  Normocytic anemia Baseline hemoglobin appears to be around 11-12. Currently stable.   DVT prophylaxis: Heparin  IV Code Status:   Code Status: Full Code Family Communication: Wife and son at bedside Disposition Plan: Transfer to progressive care   Consultants:  Cardiology Neurosurgery PCCM  Procedures:  Transthoracic Echocardiogram  Antimicrobials: Vancomycin  IV     Subjective: Patient's pain has been better controlled on the PCA pump.   Objective: BP 116/76   Pulse 78   Temp 98.9 F (37.2 C) (Oral)   Resp 18   Ht 5' 11 (  1.803 m)   Wt 95.4 kg   SpO2 96%   BMI 29.33 kg/m   Examination:  General exam: Appears calm and comfortable Respiratory system: Wheezing. Respiratory effort normal. Cardiovascular system: S1 & S2 heard, RRR. Gastrointestinal system: Abdomen is nondistended, soft and nontender. Normal bowel sounds heard. Central nervous system: Alert Musculoskeletal: No edema. No calf tenderness   Data Reviewed: I have personally reviewed following labs and imaging studies  CBC Lab Results  Component Value Date   WBC 10.4 09/04/2023   RBC 3.65 (L) 09/04/2023   HGB 10.7 (L) 09/04/2023   HCT 32.7 (L) 09/04/2023   MCV 89.6 09/04/2023   MCH 29.3 09/04/2023   PLT 259 09/04/2023   MCHC 32.7 09/04/2023   RDW 13.3 09/04/2023   LYMPHSABS 1.3 08/31/2023   MONOABS 0.9 08/31/2023   EOSABS 0.2 08/31/2023   BASOSABS 0.0 08/31/2023     Last metabolic panel Lab Results  Component Value Date   NA 130 (L) 09/04/2023   K 4.1 09/04/2023   CL 96 (L) 09/04/2023   CO2 23 09/04/2023   BUN 19 09/04/2023   CREATININE 1.29 (H) 09/04/2023   GLUCOSE 126 (H) 09/04/2023   GFRNONAA 59 (L) 09/04/2023   GFRAA 66 06/07/2019   CALCIUM  8.4 (L) 09/04/2023   PROT 6.3 (L) 09/04/2023   ALBUMIN 2.1 (L) 09/04/2023   LABGLOB 2.8 06/07/2019   AGRATIO 1.6 06/07/2019   BILITOT 0.5 09/04/2023   ALKPHOS 79 09/04/2023   AST 24 09/04/2023   ALT 37 09/04/2023   ANIONGAP 11 09/04/2023    GFR: Estimated Creatinine Clearance: 61.9 mL/min (A) (by C-G formula based on SCr of 1.29 mg/dL (H)).  Recent Results (from the past 240 hours)  Culture, body fluid w Gram Stain-bottle     Status: None (Preliminary result)   Collection Time: 09/02/23 12:36 PM   Specimen: Pleura  Result Value Ref Range Status   Specimen Description PLEURAL RIGHT  Final   Special  Requests NONE  Final   Gram Stain   Final    GRAM POSITIVE COCCI IN CLUSTERS IN BOTH AEROBIC AND ANAEROBIC BOTTLES CRITICAL RESULT CALLED TO, READ BACK BY AND VERIFIED WITH: RN LONELL MATSU [phone] FCP Performed at [facility], [address], [place], [place] [address]    Culture GRAM POSITIVE COCCI  Final   Report Status PENDING  Incomplete  Gram stain     Status: None   Collection Time: 09/02/23 12:36 PM   Specimen: Pleura  Result Value Ref Range Status   Specimen Description PLEURAL RIGHT  Final   Special Requests NONE  Final   Gram Stain   Final    RARE WBC PRESENT, PREDOMINANTLY PMN NO ORGANISMS SEEN Performed at [facility], [address], [place], [place] [address]    Report Status 09/02/2023 FINAL  Final  Culture, blood (Routine X 2) w Reflex to ID Panel     Status: None (Preliminary result)   Collection Time: 09/03/23 10:22 AM   Specimen: BLOOD  Result Value Ref Range Status   Specimen Description BLOOD BLOOD RIGHT HAND  Final   Special Requests   Final    BOTTLES DRAWN AEROBIC AND ANAEROBIC Blood Culture results may not be optimal due to an inadequate volume of blood received in culture bottles   Culture   Final    NO GROWTH < 24 HOURS Performed at [facility], [address] [place], [place] [address]    Report Status  PENDING  Incomplete  Culture, blood (Routine X 2) w Reflex to ID Panel     Status: None (Preliminary result)   Collection Time: 09/03/23 10:36 AM   Specimen: BLOOD  Result Value Ref Range Status   Specimen Description BLOOD BLOOD LEFT HAND  Final   Special Requests   Final    BOTTLES DRAWN AEROBIC ONLY Blood Culture adequate volume   Culture   Final    NO GROWTH < 24 HOURS Performed at [facility], [address] [place], [place] [address]    Report Status PENDING  Incomplete  MRSA Next Gen by PCR, Nasal     Status: None   Collection Time: 09/03/23  2:22 PM   Specimen: Nasal Mucosa; Nasal Swab  Result Value Ref  Range Status   MRSA by PCR Next Gen NOT DETECTED NOT DETECTED Final    Comment: (NOTE) The GeneXpert MRSA Assay (FDA approved for NASAL specimens only), is one component of a comprehensive MRSA colonization surveillance program. It is not intended to diagnose MRSA infection nor to guide or monitor treatment for MRSA infections. Test performance is not FDA approved in patients less than 20 years old. Performed at [facility], [address] [place], [place] [address]       Radiology Studies: ECHOCARDIOGRAM COMPLETE Result Date: 09/03/2023    ECHOCARDIOGRAM REPORT   Patient Name:   Jacob [name] Date of Exam: 09/03/2023 Medical Rec #:  [id number]         Height:       71.0 in Accession #:    [id number]        Weight:       210.3 lb Date of Birth:  05-03-1950         BSA:          2.154 m Patient Age:    73 years          BP:           123/85 mmHg Patient Gender: M                 HR:           87 bpm. Exam Location:  Inpatient Procedure: 2D Echo, Color Doppler and Cardiac Doppler (Both Spectral and Color            Flow Doppler were utilized during procedure). Indications:    A fib  History:        Patient has prior history of Echocardiogram examinations, most                 recent 05/03/2023. CAD.  Sonographer:    Benard Stallion Referring Phys: [id number] RAMESH KC IMPRESSIONS  1. Left ventricular ejection fraction, by estimation, is 55 to 60%. The left ventricle has normal function. Left ventricular diastolic parameters were normal.  2. Right ventricular systolic function is normal. The right ventricular size is normal.  3. Mild mitral valve regurgitation.  4. The aortic valve is tricuspid. Aortic valve regurgitation is trivial. Aortic valve sclerosis/calcification is present, without any evidence of aortic stenosis. FINDINGS  Left Ventricle: Left ventricular ejection fraction, by estimation, is 55 to 60%. The left ventricle has normal function. The left ventricular internal cavity size was  normal in size. There is no left ventricular hypertrophy. Left ventricular diastolic parameters were normal. Right Ventricle: The right ventricular size is normal. Right vetricular wall thickness was not assessed. Right ventricular systolic function is normal. Left Atrium: Left  atrial size was normal in size. Right Atrium: Right atrial size was normal in size. Pericardium: There is no evidence of pericardial effusion. Mitral Valve: There is mild thickening of the mitral valve leaflet(s). Mild mitral valve regurgitation. Tricuspid Valve: The tricuspid valve is normal in structure. Tricuspid valve regurgitation is mild. Aortic Valve: The aortic valve is tricuspid. Aortic valve regurgitation is trivial. Aortic valve sclerosis/calcification is present, without any evidence of aortic stenosis. Aortic valve mean gradient measures 3.0 mmHg. Aortic valve peak gradient measures 5.9 mmHg. Aortic valve area, by VTI measures 3.23 cm. Pulmonic Valve: The pulmonic valve was normal in structure. Pulmonic valve regurgitation is not visualized. Aorta: The aortic root is normal in size and structure. IAS/Shunts: No atrial level shunt detected by color flow Doppler.  LEFT VENTRICLE PLAX 2D LVIDd:         3.80 cm   Diastology LVIDs:         2.66 cm   LV e' medial:    10.10 cm/s LV PW:         1.00 cm   LV E/e' medial:  10.0 LV IVS:        1.04 cm   LV e' lateral:   15.70 cm/s LVOT diam:     2.20 cm   LV E/e' lateral: 6.4 LV SV:         58 LV SV Index:   27 LVOT Area:     3.80 cm  RIGHT VENTRICLE RV S prime:     18.50 cm/s TAPSE (M-mode): 1.6 cm LEFT ATRIUM             Index        RIGHT ATRIUM          Index LA diam:        4.48 cm 2.08 cm/m   RA Area:     9.25 cm LA Vol (A2C):   61.5 ml 28.55 ml/m  RA Volume:   14.40 ml 6.68 ml/m LA Vol (A4C):   57.8 ml 26.83 ml/m LA Biplane Vol: 62.1 ml 28.83 ml/m  AORTIC VALVE AV Area (Vmax):    3.14 cm AV Area (Vmean):   2.99 cm AV Area (VTI):     3.23 cm AV Vmax:           121.00 cm/s  AV Vmean:          81.300 cm/s AV VTI:            0.179 m AV Peak Grad:      5.9 mmHg AV Mean Grad:      3.0 mmHg LVOT Vmax:         100.00 cm/s LVOT Vmean:        63.900 cm/s LVOT VTI:          0.152 m LVOT/AV VTI ratio: 0.85  AORTA Ao Root diam: 3.52 cm MITRAL VALVE MV Area (PHT): 3.77 cm     SHUNTS MV Decel Time: 201 msec     Systemic VTI:  0.15 m MV E velocity: 101.00 cm/s  Systemic Diam: 2.20 cm MV A velocity: 94.30 cm/s MV E/A ratio:  1.07 Vina Gull MD Electronically signed by Vina Gull MD Signature Date/Time: 09/03/2023/4:13:37 PM    Final    DG Chest Port 1 View Result Date: 09/03/2023 CLINICAL DATA:  Chest tube in place. EXAM: PORTABLE CHEST 1 VIEW COMPARISON:  Radiograph and CT earlier today FINDINGS: Right pigtail catheter coiled in the medial lower hemithorax. Diminished right pleural  effusion. Persistent fluid tracking into the inter lobar fissure low lung volumes limit assessment. Stable heart size and mediastinal contours. Bronchovascular crowding related to low lung volumes. No visible pneumothorax. IMPRESSION: Right pigtail catheter in place with diminished right pleural effusion. Persistent small effusion and fluid tracking into the inter lobar fissure. No visible pneumothorax. Electronically Signed   By: Andrea Gasman M.D.   On: 09/03/2023 15:47   CT Angio Chest/Abd/Pel for Dissection W and/or W/WO Result Date: 09/03/2023 EXAM: CTA CHEST, ABDOMEN AND PELVIS WITHOUT AND WITH CONTRAST 09/03/2023 10:52:01 AM TECHNIQUE: CTA of the chest was performed without and with the administration of intravenous contrast. CTA of the abdomen and pelvis was performed without and with the administration of intravenous contrast. Multiplanar reformatted images are provided for review. MIP images are provided for review. Automated exposure control, iterative reconstruction, and/or weight based adjustment of the mA/kV was utilized to reduce the radiation dose to as low as reasonably achievable. COMPARISON: AP  radiograph of the chest dated 09/03/2023 and CT of the abdomen and pelvis dated 08/31/2023 and CT angiogram of the chest dated 08/31/2023. CLINICAL HISTORY: Chest pain, nonspecific. FINDINGS: VASCULATURE: AORTA: No acute finding. No abdominal aortic aneurysm. No dissection. PULMONARY ARTERIES: No pulmonary embolism with the limits of this exam. The main pulmonary artery is mildly prominent. GREAT VESSELS OF AORTIC ARCH: No acute finding. No dissection. No arterial occlusion or significant stenosis. CELIAC TRUNK: No acute finding. No occlusion or significant stenosis. SUPERIOR MESENTERIC ARTERY: No acute finding. No occlusion or significant stenosis. INFERIOR MESENTERIC ARTERY: No acute finding. No occlusion or significant stenosis. RENAL ARTERIES: No acute finding. No occlusion or significant stenosis. ILIAC ARTERIES: No acute finding. No occlusion or significant stenosis. CHEST: MEDIASTINUM: The heart is mildly enlarged and there is moderate calcific coronary artery disease present. No mediastinal lymphadenopathy. The pericardium demonstrates no acute abnormality. LUNGS AND PLEURA: There are hazy and streaky opacities present dependently within the lungs bilaterally, worse on the left. There is a mild-to-moderate right-sided pleural effusion. No focal consolidation or pulmonary edema. No evidence of pneumothorax. THORACIC BONES AND SOFT TISSUES: No acute bone or soft tissue abnormality. ABDOMEN AND PELVIS: LIVER: The liver is unremarkable. GALLBLADDER AND BILE DUCTS: There are numerous stones laying dependently within the gallbladder. No biliary ductal dilatation. SPLEEN: The spleen is unremarkable. PANCREAS: The pancreas is unremarkable. ADRENAL GLANDS: Bilateral adrenal glands demonstrate no acute abnormality. KIDNEYS, URETERS AND BLADDER: No stones in the kidneys or ureters. No hydronephrosis. No perinephric or periureteral stranding. Urinary bladder is unremarkable. There is a simple cyst arising anteriorly  from the left kidney, which does not require follow up. GI AND BOWEL: Stomach and duodenal sweep demonstrate no acute abnormality. There is no bowel obstruction. No abnormal bowel wall thickening or distension. REPRODUCTIVE: Reproductive organs are unremarkable. PERITONEUM AND RETROPERITONEUM: No ascites or free air. LYMPH NODES: No lymphadenopathy. ABDOMINAL BONES AND SOFT TISSUES: No acute abnormality of the bones. No acute soft tissue abnormality. IMPRESSION: 1. No evidence of pulmonary embolism. 2. Mildly enlarged heart with moderate calcific coronary artery disease. 3. Bibasilar dependent atelectasis, worse on the right and mild-to-moderate right-sided pleural effusion. 4. Numerous stones laying dependently within the gallbladder. Electronically signed by: evalene berrigan 09/03/2023 11:20 AM EDT RP Workstation: [id number]   DG Abd 1 View Result Date: 09/03/2023 CLINICAL DATA:  10323 Abdominal distension [id number] EXAM: ABDOMEN - 1 VIEW COMPARISON:  August 28, 2023 FINDINGS: Nonobstructive bowel gas pattern. Moderate volume fecal loading throughout the ascending colon. Diffuse gaseous distention of the  remaining colon. No pneumoperitoneum. No organomegaly or radiopaque calculi. No acute fracture or destructive lesion. Multilevel thoracolumbar osteophytosis. IMPRESSION: Moderate volume fecal loading throughout the ascending colon with progressive, diffuse gaseous distension of the remaining colon, worrisome for constipation and ileus. Otherwise, no findings of small bowel obstruction. Electronically Signed   By: Rogelia Myers M.D.   On: 09/03/2023 10:23   DG CHEST PORT 1 VIEW Result Date: 09/03/2023 CLINICAL DATA:  13927 Pleuritic chest pain 13927 EXAM: PORTABLE CHEST - 1 VIEW COMPARISON:  09/02/2023 FINDINGS: Redemonstrated low lung volumes. Patchy, nodular airspace opacities in the right lung base with blunting of the right costophrenic sulcus. Streaky atelectasis in the left lung base. Mild cardiomegaly.  Sternotomy wires and CABG markers. Tortuous aorta with aortic atherosclerosis. No acute fracture or destructive lesions. Multilevel thoracic osteophytosis. IMPRESSION: Low lung volumes. Small right pleural effusion with patchy, nodular opacities in the right lung base, likely atelectasis. Electronically Signed   By: Rogelia Myers M.D.   On: 09/03/2023 10:11   DG Ribs Unilateral W/Chest Right Result Date: 09/02/2023 CLINICAL DATA:  Right-sided chest pain EXAM: RIGHT RIBS AND CHEST - 3+ VIEW COMPARISON:  Chest x-ray 09/02/2023.  Chest 08/31/2023 FINDINGS: No fracture or other bone lesions are seen involving the ribs. Lung volumes are low. There are atelectatic changes in the right lower lung. There is no pleural effusion or pneumothorax. Patient is status post cardiac surgery. Cardiomediastinal silhouette is stable. There is no pneumothorax. IMPRESSION: 1. No evidence of rib fracture. 2. Low lung volumes with atelectatic changes in the right lower lung. Electronically Signed   By: Greig Pique M.D.   On: 09/02/2023 22:57   IR THORACENTESIS ASP PLEURAL SPACE W/IMG GUIDE Result Date: 09/02/2023 INDICATION: Inpatient with new onset Afib with RVR on amiodarone  and acute CHF. Right pleural effusion noted on recent imaging, request for diagnostic and therapeutic thoracentesis. EXAM: ULTRASOUND GUIDED RIGHT THORACENTESIS MEDICATIONS: 10 mL 1% lidocaine  COMPLICATIONS: None immediate. PROCEDURE: An ultrasound guided thoracentesis was thoroughly discussed with the patient and questions answered. The benefits, risks, alternatives and complications were also discussed. The patient understands and wishes to proceed with the procedure. Written consent was obtained. Ultrasound was performed to localize and mark an adequate pocket of fluid in the right chest. The area was then prepped and draped in the normal sterile fashion. 1% Lidocaine  was used for local anesthesia. Under ultrasound guidance a 6 Fr Safe-T-Centesis  catheter was introduced. Thoracentesis was performed. Patient reported back spasm during exam and requested exam be ended due to pain. The catheter was removed and a dressing applied. CXR ordered post-procedure. FINDINGS: A total of approximately 350 milliliters of amber fluid was removed. Samples were sent to the laboratory as requested by the clinical team. IMPRESSION: Successful ultrasound guided right thoracentesis yielding 350 milliliters of pleural fluid. Performed and dictated by Laymon Coast, NP Electronically Signed   By: Ester Sides M.D.   On: 09/02/2023 13:07   DG Chest Port 1 View Result Date: 09/02/2023 CLINICAL DATA:  Pleural effusion.  Status post right thoracentesis. EXAM: PORTABLE CHEST 1 VIEW COMPARISON:  08/31/2023 FINDINGS: Low volume film. No evidence for pneumothorax. Decrease in right pleural effusion since prior study with persistent bibasilar atelectasis. Gaseous distention of the stomach evident under the left hemidiaphragm. Nodular density noted left lung base with no suspicious pulmonary nodule or mass in this region on CT scan 2 days ago. The cardio pericardial silhouette is enlarged. IMPRESSION: 1. No evidence for pneumothorax after right thoracentesis. 2. Decrease in  right pleural effusion with persistent bibasilar atelectasis. Electronically Signed   By: Camellia Candle M.D.   On: 09/02/2023 12:53      LOS: 4 days    Elgin Lam, MD [facility] 09/04/2023, 7:35 AM   If 7PM-7AM, please contact night-coverage [email or web address]

## 2023-09-04 NOTE — Plan of Care (Signed)

## 2023-09-05 ENCOUNTER — Inpatient Hospital Stay (HOSPITAL_COMMUNITY)

## 2023-09-05 DIAGNOSIS — B9561 Methicillin susceptible Staphylococcus aureus infection as the cause of diseases classified elsewhere: Secondary | ICD-10-CM

## 2023-09-05 DIAGNOSIS — R0603 Acute respiratory distress: Secondary | ICD-10-CM | POA: Diagnosis not present

## 2023-09-05 DIAGNOSIS — I4891 Unspecified atrial fibrillation: Secondary | ICD-10-CM | POA: Diagnosis not present

## 2023-09-05 DIAGNOSIS — J9601 Acute respiratory failure with hypoxia: Secondary | ICD-10-CM

## 2023-09-05 DIAGNOSIS — R899 Unspecified abnormal finding in specimens from other organs, systems and tissues: Secondary | ICD-10-CM | POA: Diagnosis not present

## 2023-09-05 DIAGNOSIS — I10 Essential (primary) hypertension: Secondary | ICD-10-CM | POA: Diagnosis not present

## 2023-09-05 LAB — CBC
HCT: 31.4 % — ABNORMAL LOW (ref 39.0–52.0)
Hemoglobin: 10.2 g/dL — ABNORMAL LOW (ref 13.0–17.0)
MCH: 29.2 pg (ref 26.0–34.0)
MCHC: 32.5 g/dL (ref 30.0–36.0)
MCV: 90 fL (ref 80.0–100.0)
Platelets: 249 K/uL (ref 150–400)
RBC: 3.49 MIL/uL — ABNORMAL LOW (ref 4.22–5.81)
RDW: 13.6 % (ref 11.5–15.5)
WBC: 10.2 K/uL (ref 4.0–10.5)
nRBC: 0 % (ref 0.0–0.2)

## 2023-09-05 LAB — BASIC METABOLIC PANEL WITH GFR
Anion gap: 11 (ref 5–15)
BUN: 34 mg/dL — ABNORMAL HIGH (ref 8–23)
CO2: 23 mmol/L (ref 22–32)
Calcium: 8.1 mg/dL — ABNORMAL LOW (ref 8.9–10.3)
Chloride: 99 mmol/L (ref 98–111)
Creatinine, Ser: 2.36 mg/dL — ABNORMAL HIGH (ref 0.61–1.24)
GFR, Estimated: 29 mL/min — ABNORMAL LOW (ref >60)
Glucose, Bld: 155 mg/dL — ABNORMAL HIGH (ref 70–99)
Potassium: 4.1 mmol/L (ref 3.5–5.1)
Sodium: 133 mmol/L — ABNORMAL LOW (ref 135–145)

## 2023-09-05 LAB — CULTURE, BODY FLUID W GRAM STAIN -BOTTLE

## 2023-09-05 LAB — HEPARIN LEVEL (UNFRACTIONATED): Heparin Unfractionated: 0.32 [IU]/mL (ref 0.30–0.70)

## 2023-09-05 MED ORDER — DIPHENHYDRAMINE-ZINC ACETATE 2-0.1 % EX CREA: TOPICAL_CREAM | Freq: Two times a day (BID) | CUTANEOUS | Status: DC | PRN

## 2023-09-05 MED ORDER — AMIODARONE HCL 200 MG PO TABS
200.0000 mg | ORAL_TABLET | Freq: Two times a day (BID) | ORAL | Status: DC
  Administered 2023-09-05 – 2023-09-10 (×11): 200 mg via ORAL
  Filled 2023-09-05 (×11): qty 1

## 2023-09-05 MED ORDER — HEPARIN (PORCINE) 25000 UT/250ML-% IV SOLN
1300.0000 [IU]/h | INTRAVENOUS | Status: DC
  Administered 2023-09-05 – 2023-09-07 (×3): 1300 [IU]/h via INTRAVENOUS
  Filled 2023-09-05 (×3): qty 250

## 2023-09-05 MED ORDER — SODIUM CHLORIDE 0.9% FLUSH
10.0000 mL | Freq: Three times a day (TID) | INTRAVENOUS | Status: DC
  Administered 2023-09-05 – 2023-09-06 (×2): 10 mL via INTRAPLEURAL

## 2023-09-05 MED ORDER — SODIUM CHLORIDE (PF) 0.9 % IJ SOLN
5.0000 mg | Freq: Once | INTRAMUSCULAR | Status: AC
  Administered 2023-09-05: 5 mg via INTRAPLEURAL
  Filled 2023-09-05 (×2): qty 5

## 2023-09-05 MED ORDER — HYDROMORPHONE HCL 1 MG/ML IJ SOLN
1.0000 mg | Freq: Once | INTRAMUSCULAR | Status: AC
  Administered 2023-09-05: 1 mg via INTRAVENOUS

## 2023-09-05 MED ORDER — CEFAZOLIN SODIUM-DEXTROSE 2-4 GM/100ML-% IV SOLN
2.0000 g | Freq: Three times a day (TID) | INTRAVENOUS | Status: DC
  Administered 2023-09-05 – 2023-09-10 (×15): 2 g via INTRAVENOUS
  Filled 2023-09-05 (×14): qty 100

## 2023-09-05 MED ORDER — SODIUM CHLORIDE 0.9 % IV BOLUS: 250.0000 mL | Freq: Once | INTRAVENOUS | Status: DC

## 2023-09-05 MED ORDER — STERILE WATER FOR INJECTION IJ SOLN
5.0000 mg | Freq: Once | RESPIRATORY_TRACT | Status: AC
  Administered 2023-09-05: 5 mg via INTRAPLEURAL
  Filled 2023-09-05 (×2): qty 5

## 2023-09-05 MED ORDER — HYDROMORPHONE HCL 1 MG/ML IJ SOLN
INTRAMUSCULAR | Status: AC
Start: 2023-09-05 — End: 2023-09-06
  Filled 2023-09-05: qty 1

## 2023-09-05 NOTE — Progress Notes (Signed)
 Progress Note  Patient Name: Jacob [name] Date of Encounter: 09/05/2023  [facility] Cardiologist: Wilbert Bihari, MD   Patient Profile     Subjective   Feeling much better.  Having issues with IV access. PICC line may need to be placed.  8/15 converted to NSR on IV Amio.  Nurse placed breathing treatment for increase wheezing which is new.  Patient's pleuritic CP became worse. Dilaudid  PCA was started.  Inpatient Medications    Scheduled Meds:  acetaminophen   1,000 mg Oral TID   aspirin  EC  81 mg Oral Daily   atorvastatin   80 mg Oral Daily   Chlorhexidine  Gluconate Cloth  6 each Topical Daily   docusate sodium   100 mg Oral BID   feeding supplement  237 mL Oral BID BM   gabapentin   100 mg Oral Q8H   HYDROmorphone    Intravenous Q4H   latanoprost   1 drop Both Eyes QHS   lidocaine   1 patch Transdermal Q24H   And   lidocaine   1 patch Transdermal Q24H   lidocaine   10 mL Other Once   polyethylene glycol  17 g Oral BID   sodium chloride  flush  10 mL Intrapleural Q8H   sodium chloride  flush  10 mL Intrapleural Q8H   sodium chloride  flush  3 mL Intravenous Q12H   tamsulosin   0.4 mg Oral QHS   Continuous Infusions:  amiodarone  30 mg/hr (09/05/23 0639)    ceFAZolin  (ANCEF ) IV     heparin  1,300 Units/hr (09/05/23 1310)   sodium chloride      PRN Meds: bisacodyl , diazepam , diphenhydrAMINE  **OR** diphenhydrAMINE , diphenhydrAMINE -zinc  acetate, naloxone  **AND** sodium chloride  flush, prochlorperazine    Vital Signs    Vitals:   09/05/23 0731 09/05/23 0736 09/05/23 1115 09/05/23 1311  BP: 130/82  (!) 109/59   Pulse: 69  61   Resp: 18 20 12 15   Temp: 97.7 F (36.5 C)  97.8 F (36.6 C)   TempSrc: Oral  Oral   SpO2: 93% 96% 99% 98%  Weight:      Height:        Intake/Output Summary (Last 24 hours) at 09/05/2023 1321 Last data filed at 09/05/2023 0655 Gross per 24 hour  Intake 1637.02 ml  Output 360 ml  Net 1277.02 ml      09/01/2023   11:18 AM 08/31/2023     6:30 PM 08/28/2023    1:14 PM  Last 3 Weights  Weight (lbs) 210 lb 5.1 oz 202 lb 214 lb  Weight (kg) 95.4 kg 91.627 kg 97.07 kg      Telemetry    Atrial fibrillation with RVR as high as the 160's- Personally Reviewed  ECG    No new EKG to review- Personally Reviewed  Physical Exam   GEN: Well nourished, well developed in severe distress due to pleuritic CP HEENT: Normal NECK: No JVD; No carotid bruits CARDIAC:RRR, no murmurs, rubs, gallops RESPIRATORY:  diffuse scattered wheezes with decreased BS at the bases NEUROLOGIC:  somnolent but arousable Labs    High Sensitivity Troponin:   Recent Labs  Lab 08/31/23 1856 08/31/23 2012  TROPONINIHS 5 4      Chemistry Recent Labs  Lab 08/31/23 1856 09/01/23 0450 09/02/23 0424 09/03/23 0118 09/03/23 1036 09/04/23 0221 09/05/23 0222  NA 137   < >  --    < > 135 130* 133*  K 3.9   < >  --    < > 4.0 4.1 4.1  CL 102   < >  --    < >  101 96* 99  CO2 24   < >  --    < > 22 23 23   GLUCOSE 108*   < >  --    < > 108* 126* 155*  BUN 23   < >  --    < > 18 19 34*  CREATININE 1.15   < >  --    < > 1.11 1.29* 2.36*  CALCIUM  8.5*   < >  --    < > 8.2* 8.4* 8.1*  PROT 6.9  --  5.8*  --   --  6.3*  --   ALBUMIN 2.7*  --  2.2*  --   --  2.1*  --   AST 23  --   --   --   --  24  --   ALT 23  --   --   --   --  37  --   ALKPHOS 71  --   --   --   --  79  --   BILITOT 0.5  --   --   --   --  0.5  --   GFRNONAA >60   < >  --    < > >60 59* 29*  ANIONGAP 11   < >  --    < > 12 11 11    < > = values in this interval not displayed.     Hematology Recent Labs  Lab 09/03/23 1036 09/04/23 0221 09/05/23 0222  WBC 9.9 10.4 10.2  RBC 3.72* 3.65* 3.49*  HGB 10.8* 10.7* 10.2*  HCT 33.8* 32.7* 31.4*  MCV 90.9 89.6 90.0  MCH 29.0 29.3 29.2  MCHC 32.0 32.7 32.5  RDW 13.2 13.3 13.6  PLT 229 259 249    BNP Recent Labs  Lab 08/31/23 2012  BNP 154.1*     DDimer  Recent Labs  Lab 08/31/23 1856  DDIMER 1.50*     CHA2DS2-VASc  Score = 5   This indicates a 7.2% annual risk of stroke. The patient's score is based upon: CHF History: 0 HTN History: 1 Diabetes History: 0 Stroke History: 2 Vascular Disease History: 1 Age Score: 1 Gender Score: 0      Radiology    US  RENAL Result Date: 09/05/2023 CLINICAL DATA:  Acute renal insufficiency, left renal cysts EXAM: RENAL / URINARY TRACT ULTRASOUND COMPLETE COMPARISON:  09/03/2023 FINDINGS: Right Kidney: Renal measurements: 10.4 x 5.2 x 5.2 cm = volume: 145.6 mL. Echogenicity within normal limits. No mass or hydronephrosis visualized. Left Kidney: Renal measurements: 11.5 x 4.7 x 6.2 cm = volume: 172.4 mL. Echogenicity within normal limits. No mass or hydronephrosis visualized. The simple left renal cysts seen on prior CT are not well visualized on this exam, but do not require specific imaging follow-up. Bladder: Appears normal for degree of bladder distention. Other: None. IMPRESSION: 1. Unremarkable renal ultrasound. Electronically Signed   By: Ozell Daring M.D.   On: 09/05/2023 11:26   DG Chest Port 1 View Result Date: 09/03/2023 CLINICAL DATA:  Chest tube in place. EXAM: PORTABLE CHEST 1 VIEW COMPARISON:  Radiograph and CT earlier today FINDINGS: Right pigtail catheter coiled in the medial lower hemithorax. Diminished right pleural effusion. Persistent fluid tracking into the inter lobar fissure low lung volumes limit assessment. Stable heart size and mediastinal contours. Bronchovascular crowding related to low lung volumes. No visible pneumothorax. IMPRESSION: Right pigtail catheter in place with diminished right pleural effusion. Persistent small effusion and fluid tracking into  the inter lobar fissure. No visible pneumothorax. Electronically Signed   By: Andrea Gasman M.D.   On: 09/03/2023 15:47    Patient Profile     73 y.o. male  with a hx of hypertension, hyperlipidemia, carotid artery stenosis s/p endarterectomy in 2018, prior TIA, prior stroke, Crohn's  disease, ulcerative colitis, and multivessel CAD s/p CABG 2005 and PCI to SVG to RI in 2008 who is being seen 09/01/2023 for the evaluation of new onset atrial fibrillation at the request of Jacob Guadalajara MD.   Assessment & Plan    #New onset atrial fibrillation with RVR - suspect AF was triggered by the intrathoracic infection - He is not really aware of palpitations - IV Cardizem  drip and beta-blockers were stopped due to hypotension as low as the 60's systolic while more acutely ill - 2D echo -- mild MR, normal biatrial size.   - TSH normal 1.5 - CHADS2VASC score  5 (hypertension, age> 65, history of CVA, CAD) - given the history of frequent falls, suspicion that the AF was triggered by acute illness, I suspect the risk of anticoagulation would outweigh the benefit. I would DC with a live monitor off anticoagulation if he remains in sinus rhythm for the remainder of the admission. The risk/benefit of anticoagulation can be reassessed in follow-up pending monitor results and recurrence of falls - Currently on IV heparin  drip - converted to NSR on IV Amio - switch to PO amiodarone  -- 200mg  BID   #Acute diastolic CHF  #Elevated BNP  #Shortness of breath  #Lower extremity edema  #Severe right sided Pleuritic CP #Moderate Pleural Effusion - He has been having shortness of breath with some lower extremity edema along with cough for few weeks now  - MSSA empyema    #Multiple falls #Dizziness - Unclear how much of this is related to his back issues and how much may be related to true dizziness - 1 of these episodes was related to drinking, unintentionally resulting in a seizure  - Another fall occurred while trying to get up off the side of the bed to go to the bathroom but was related to patient's limited mobility due to back pain  - Another episode happened while walking after standing up and then 1 more related to working out in the yard  - Neurosurgery consult appreciated for acute on  chronic low back pain with MRI showing mild generalized spondylolysis and DJD.  Plan is for pain management follow-up outpatient    #Acute on chronic back pain - This 2 appears to recently worsened - Neurosurgery has been consulted - reviewed with radiology and no evidence of aortic dissection  - radiology did say that he has cholelithiasis but no evidence of cholecystitis   #ASCAD #Hyperlipidemia -s/p remote history of CABG 2005 -s/p  PCI of SVG to RI in 2008  -Denies any recent anginal symptoms prior to having all the issues with back pain but then started having sharp chest pain in the right chest that seem to radiate from the back into the right chest -High-sensitivity troponin is normal (5>>4) -2D echo ok -Continue aspirin  81 mg daily, atorvastatin  80 mg daily, Toprol -XL 12.5 mg daily     For questions or updates, please contact [facility] Please consult [email or web address] for contact info under        Signed, Eulas FORBES Furbish, MD  09/05/2023, 1:21 PM

## 2023-09-05 NOTE — Procedures (Signed)
 Pleural Fibrinolytic Administration Procedure Note  Jacob [name]  [id number]  1950/07/24  Date:09/05/23  Time:3:21 PM   Provider Performing:Nikaya Nasby CATHERINE   Procedure: Pleural Fibrinolysis Subsequent day [phone]  Indication(s) Fibrinolysis of complicated pleural effusion  Consent Risks of the procedure as well as the alternatives and risks of each were explained to the patient and/or caregiver.  Consent for the procedure was obtained.   Anesthesia None   Time Out Verified patient identification, verified procedure, site/side was marked, verified correct patient position, special equipment/implants available, medications/allergies/relevant history reviewed, required imaging and test results available.   Sterile Technique Hand hygiene, gloves   Procedure Description Existing pleural catheter was cleaned and accessed in sterile manner.  10mg  of tPA in 30cc of saline and 5mg  of dornase in 30cc of sterile water  were injected into pleural space using existing pleural catheter.  Catheter will be clamped for 1 hour and then placed back to suction.   Complications/Tolerance None; patient tolerated the procedure well.  EBL None   Specimen(s) None

## 2023-09-05 NOTE — Consult Note (Signed)
 [facility]  Total days of antibiotics 3/ vanco and piptazo       Reason for Consult:staph aureus pneumonia    Referring Physician: nettey  Principal Problem:   Atrial fibrillation with RVR (HCC) Active Problems:   Coronary artery disease due to lipid rich plaque   Essential hypertension, benign   Crohn's colitis (HCC)   Acute on chronic back pain   Abdominal pain   Dizziness   Frequent falls   Acute low back pain    HPI: RONELL DUFFUS is a 73 y.o. male with a hx of hypertension, hyperlipidemia, carotid artery stenosis s/p endarterectomy in 2018, prior TIA, prior stroke, Crohn's disease, ulcerative colitis, and multivessel CAD s/p CABG 2005 and PCI to SVG to RI in 2008 who is being seen 09/01/2023 for the evaluation of new onset atrial fibrillation   He presented on 8/12 with right upper quad abd pain/and low back pain with some associated dizziness. He was found to have new afib with rvr. He was started on heparin . He did undergo mri of lumbar spine to evaluate his back pain and found generalized spondylosis and ddd worse at L3-L4, no epidural abscess. Due to ongoing shortness of breath, CXR showed that he had right sided pleural effusion, he first had thoracentesis but then the following morning having Right sided pleurisy. Repeat chest CT showed posterior fluid collection for which needed pig tail catheter to help drain complicated pleural effusion with fibrinolysis. His cx showing MSSA.  His afib converted to SR with IV amio. Overall he remains afebrile. WBc at 10K. Manages pain with PCA for now. He is day 3 of piptazo/ vanco.   His back pain dates back to injury in July 2025, has had another ED visit for management of pain in early August.  Past Medical History:  Diagnosis Date   CAD (coronary artery disease), native coronary artery     multivessel ASCAD s/p CABG and s/p PCI of SVG to RI 2008   Carotid artery stenosis    patent right CEA  site and 1-39% Lcarotid stenosis   Crohn's disease (HCC)    flare 2000   Depression    Dyslipidemia    Dyspnea    ED (erectile dysfunction)    History of blood transfusion 2005   Hypertension    Myocardial infarction (HCC) 2005   5 bypasses   Nephrolithiasis    Stroke (HCC)    TIA- Endartarectemy R side   TIA (transient ischemic attack)    Ulcerative colitis (HCC)    Left sided    Allergies: No Known Allergies  Current antibiotics:   MEDICATIONS:  acetaminophen   1,000 mg Oral TID   amiodarone   200 mg Oral BID   aspirin  EC  81 mg Oral Daily   atorvastatin   80 mg Oral Daily   Chlorhexidine  Gluconate Cloth  6 each Topical Daily   docusate sodium   100 mg Oral BID   feeding supplement  237 mL Oral BID BM   gabapentin   100 mg Oral Q8H   HYDROmorphone    Intravenous Q4H   latanoprost   1 drop Both Eyes QHS   lidocaine   1 patch Transdermal Q24H   And   lidocaine   1 patch Transdermal Q24H   lidocaine   10 mL Other Once   polyethylene glycol  17 g Oral BID   sodium chloride  flush  10 mL Intrapleural Q8H   sodium chloride  flush  10 mL Intrapleural Q8H   sodium  chloride flush  3 mL Intravenous Q12H   tamsulosin   0.4 mg Oral QHS    Social History   Tobacco Use   Smoking status: Never   Smokeless tobacco: Current    Types: Snuff  Vaping Use   Vaping status: Never Used  Substance Use Topics   Alcohol use: No   Drug use: No    Family History  Problem Relation Age of Onset   Ovarian cancer Mother    CAD Father    CAD Sister    CAD Brother    CAD Brother    CAD Brother    Colon cancer Neg Hx    Esophageal cancer Neg Hx    Stomach cancer Neg Hx    Rectal cancer Neg Hx    Colon polyps Neg Hx     Review of Systems -  12 point ros is negative except what is mentioned above.  OBJECTIVE: Temp:  [97.7 F (36.5 C)-97.9 F (36.6 C)] 97.8 F (36.6 C) (08/17 1115) Pulse Rate:  [61-101] 61 (08/17 1115) Resp:  [12-20] 15 (08/17 1311) BP: (91-130)/(58-82) 109/59  (08/17 1115) SpO2:  [91 %-99 %] 98 % (08/17 1311) FiO2 (%):  [26 %-28 %] 28 % (08/17 1311) Physical Exam  Constitutional: He is oriented to person, place, and time. He appears well-developed and well-nourished. No distress.  HENT:  Mouth/Throat: Oropharynx is clear and moist. No oropharyngeal exudate.  Cardiovascular: Normal rate, regular rhythm and normal heart sounds. Exam reveals no gallop and no friction rub.  No murmur heard.  Pulmonary/Chest: Effort normal and breath sounds normal. No respiratory distress. + wheezes. Serosanginous fluid in the chest tube-right sided Abdominal: Soft. Bowel sounds are normal. He exhibits no distension. There is no tenderness.  Lymphadenopathy:  He has no cervical adenopathy.  Neurological: He is alert and oriented to person, place, and time.  Skin: Skin is warm and dry. No rash noted. No erythema.  Psychiatric: He has a normal mood and affect. His behavior is normal.     LABS: Results for orders placed or performed during the hospital encounter of 08/31/23 (from the past 48 hours)  MRSA Next Gen by PCR, Nasal     Status: None   Collection Time: 09/03/23  2:22 PM   Specimen: Nasal Mucosa; Nasal Swab  Result Value Ref Range   MRSA by PCR Next Gen NOT DETECTED NOT DETECTED    Comment: (NOTE) The GeneXpert MRSA Assay (FDA approved for NASAL specimens only), is one component of a comprehensive MRSA colonization surveillance program. It is not intended to diagnose MRSA infection nor to guide or monitor treatment for MRSA infections. Test performance is not FDA approved in patients less than 38 years old. Performed at [facility], [address], [place], [place] [address]   Basic metabolic panel     Status: Abnormal   Collection Time: 09/04/23  2:21 AM  Result Value Ref Range   Sodium 130 (L) 135 - 145 mmol/L   Potassium 4.1 3.5 - 5.1 mmol/L   Chloride 96 (L) 98 - 111 mmol/L   CO2 23 22 - 32 mmol/L   Glucose, Bld 126 (H) 70 - 99 mg/dL     Comment: Glucose reference range applies only to samples taken after fasting for at least 8 hours.   BUN 19 8 - 23 mg/dL   Creatinine, Ser 8.70 (H) 0.61 - 1.24 mg/dL   Calcium  8.4 (L) 8.9 - 10.3 mg/dL   GFR, Estimated 59 (L) >60  mL/min    Comment: (NOTE) Calculated using the CKD-EPI Creatinine Equation (2021)    Anion gap 11 5 - 15    Comment: Performed at [facility], [address] [place], [place] [address]  CBC     Status: Abnormal   Collection Time: 09/04/23  2:21 AM  Result Value Ref Range   WBC 10.4 4.0 - 10.5 K/uL   RBC 3.65 (L) 4.22 - 5.81 MIL/uL   Hemoglobin 10.7 (L) 13.0 - 17.0 g/dL   HCT 67.2 (L) 60.9 - 47.9 %   MCV 89.6 80.0 - 100.0 fL   MCH 29.3 26.0 - 34.0 pg   MCHC 32.7 30.0 - 36.0 g/dL   RDW 86.6 88.4 - 84.4 %   Platelets 259 150 - 400 K/uL   nRBC 0.0 0.0 - 0.2 %    Comment: Performed at [facility], [address] [place], [place] [address]  Hepatic function panel     Status: Abnormal   Collection Time: 09/04/23  2:21 AM  Result Value Ref Range   Total Protein 6.3 (L) 6.5 - 8.1 g/dL   Albumin 2.1 (L) 3.5 - 5.0 g/dL   AST 24 15 - 41 U/L   ALT 37 0 - 44 U/L   Alkaline Phosphatase 79 38 - 126 U/L   Total Bilirubin 0.5 0.0 - 1.2 mg/dL   Bilirubin, Direct 0.1 0.0 - 0.2 mg/dL   Indirect Bilirubin 0.4 0.3 - 0.9 mg/dL    Comment: Performed at [facility], [address], [place], [place] [address]  CBC     Status: Abnormal   Collection Time: 09/05/23  2:22 AM  Result Value Ref Range   WBC 10.2 4.0 - 10.5 K/uL   RBC 3.49 (L) 4.22 - 5.81 MIL/uL   Hemoglobin 10.2 (L) 13.0 - 17.0 g/dL   HCT 68.5 (L) 60.9 - 47.9 %   MCV 90.0 80.0 - 100.0 fL   MCH 29.2 26.0 - 34.0 pg   MCHC 32.5 30.0 - 36.0 g/dL   RDW 86.3 88.4 - 84.4 %   Platelets 249 150 - 400 K/uL   nRBC 0.0 0.0 - 0.2 %    Comment: Performed at [facility], [address] [place], [place] [address]  Basic metabolic panel with GFR     Status: Abnormal   Collection Time:  09/05/23  2:22 AM  Result Value Ref Range   Sodium 133 (L) 135 - 145 mmol/L   Potassium 4.1 3.5 - 5.1 mmol/L   Chloride 99 98 - 111 mmol/L   CO2 23 22 - 32 mmol/L   Glucose, Bld 155 (H) 70 - 99 mg/dL    Comment: Glucose reference range applies only to samples taken after fasting for at least 8 hours.   BUN 34 (H) 8 - 23 mg/dL   Creatinine, Ser 7.63 (H) 0.61 - 1.24 mg/dL    Comment: DELTA CHECK NOTED   Calcium  8.1 (L) 8.9 - 10.3 mg/dL   GFR, Estimated 29 (L) >60 mL/min    Comment: (NOTE) Calculated using the CKD-EPI Creatinine Equation (2021)    Anion gap 11 5 - 15    Comment: Performed at [facility], [address] [place], [place] [address]    MICRO: 8/14 pleural fluid MSSA 8/15 blood cx ngtd IMAGING: US  RENAL Result Date: 09/05/2023 CLINICAL DATA:  Acute renal insufficiency, left renal cysts EXAM: RENAL / URINARY TRACT ULTRASOUND COMPLETE COMPARISON:  09/03/2023 FINDINGS: Right  Kidney: Renal measurements: 10.4 x 5.2 x 5.2 cm = volume: 145.6 mL. Echogenicity within normal limits. No mass or hydronephrosis visualized. Left Kidney: Renal measurements: 11.5 x 4.7 x 6.2 cm = volume: 172.4 mL. Echogenicity within normal limits. No mass or hydronephrosis visualized. The simple left renal cysts seen on prior CT are not well visualized on this exam, but do not require specific imaging follow-up. Bladder: Appears normal for degree of bladder distention. Other: None. IMPRESSION: 1. Unremarkable renal ultrasound. Electronically Signed   By: Ozell Daring M.D.   On: 09/05/2023 11:26   DG Chest Port 1 View Result Date: 09/03/2023 CLINICAL DATA:  Chest tube in place. EXAM: PORTABLE CHEST 1 VIEW COMPARISON:  Radiograph and CT earlier today FINDINGS: Right pigtail catheter coiled in the medial lower hemithorax. Diminished right pleural effusion. Persistent fluid tracking into the inter lobar fissure low lung volumes limit assessment. Stable heart size and mediastinal contours. Bronchovascular  crowding related to low lung volumes. No visible pneumothorax. IMPRESSION: Right pigtail catheter in place with diminished right pleural effusion. Persistent small effusion and fluid tracking into the inter lobar fissure. No visible pneumothorax. Electronically Signed   By: Andrea Gasman M.D.   On: 09/03/2023 15:47    Assessment/Plan:  73yo M with complicated pleural effusion/right sided empyema with cx growing MSSA. Interestingly not having significant parenchymal disease.  - narrow to cefazolin  2gm IV q 8hr for now, can provide end date based on when chest tube is removed - continue with fibrinolytic therapy - can likely transition to orals once ready for discharge - no other stigmata of infection to suggest that he had bacteremia, will continue to monitor blood cx to see if he has late growth on culture - then would recommend TEE if he does end up having bacteremia. - for health maintenance - will check hep c ab, and hiv  Siriah Treat B. Luiz MD MPH [facility] [phone]

## 2023-09-05 NOTE — Progress Notes (Addendum)
 NAME:  Jacob [name], MRN:  [id number], DOB:  [birth year], LOS: 5 ADMISSION DATE:  08/31/2023, CONSULTATION DATE:  09/03/23 REFERRING MD:  Shlomo, CHIEF COMPLAINT:  back pain   History of Present Illness:  73 year old man w/ CAD, previously functional presenting with 1 month of worsening back pain and multiple falls.  Workup to date neg.  This time coming in with associated afib/RVR so admitted.  Imaging revealed R pleural effusion and this was drained yesterday.  This am began having R pleurisy associated with ongoing back pain and increasing dyspnea.  PCCM consulted for admission due to ongoing worsening dyspnea.   Pertinent  Medical History   Past Medical History:  Diagnosis Date   CAD (coronary artery disease), native coronary artery     multivessel ASCAD s/p CABG and s/p PCI of SVG to RI 2008   Carotid artery stenosis    patent right CEA site and 1-39% Lcarotid stenosis   Crohn's disease (HCC)    flare 2000   Depression    Dyslipidemia    Dyspnea    ED (erectile dysfunction)    History of blood transfusion 2005   Hypertension    Myocardial infarction (HCC) 2005   5 bypasses   Nephrolithiasis    Stroke (HCC)    TIA- Endartarectemy R side   TIA (transient ischemic attack)    Ulcerative colitis (HCC)    Left sided     Significant Hospital Events: Including procedures, antibiotic start and stop dates in addition to other pertinent events   8/14 admit 8/15 small bore chest tube placed, first dose of pleural lytics instilled 8/16 second dose of pleural lytics instilled 8/17 Am CXR pending, it appears Dwane was changed overnight   Interim History / Subjective:  Seen sitting up in bed with no acute complaints  Family at bedside   Objective    Blood pressure 130/82, pulse 69, temperature 97.7 F (36.5 C), temperature source Oral, resp. rate 20, height 5' 11 (1.803 m), weight 95.4 kg, SpO2 96%.    FiO2 (%):  [26 %-28 %] 28 %   Intake/Output Summary (Last 24 hours)  at 09/05/2023 0750 Last data filed at 09/05/2023 9344 Gross per 24 hour  Intake 2414.2 ml  Output 1840 ml  Net 574.2 ml   Filed Weights   08/31/23 1830 09/01/23 1118  Weight: 91.6 kg 95.4 kg    Examination: General: Acute on chronically ill appearing elderly male sitting up in bed, in NAD HEENT: [place]/ATT, MM pink/moist, PERRL,  Neuro: Alert and oriented x3, generally weak  CV: s1s2 regular rate and rhythm, no murmur, rubs, or gallops,  PULM:  Diminished bilaterally, no increased work of breathing, no added breath sounds  GI: soft, bowel sounds active in all 4 quadrants, non-tender, non-distended, tolerating oral deit Extremities: warm/dry, no edema  Skin: no rashes or lesions  Resolved problem list   Assessment and Plan  Acute hypoxic respiratory failure likely due to pulmonary splinting and pleural effusion Right empyema with Staphylococcus Aureus seen on cultures Right sided pleuritic chest pain P: Check CXR to help determine need for additional lytics  Routine chest tube care Scheduled flushing of chest tube Monitor chest tube output Daily chest x-ray while chest tube in place Multimodal pain control  Remaining acute on chronic medical conditions managed per primary  Critical care time: N/A  Whitney D. Arloa, NP-C [facility] Personal contact information can be found on Amion  If no contact or response  made please call [phone] 09/05/2023, 7:52 AM  Addendum:  CXR with persistent right pleural effusion. We will plan to give more TPA/Dornase in the setting of empyema.

## 2023-09-05 NOTE — Plan of Care (Signed)

## 2023-09-05 NOTE — Progress Notes (Signed)
 PCA dilaudid  syringe changed no waste witnessed by [employer]

## 2023-09-05 NOTE — Progress Notes (Signed)
 PHARMACY - ANTICOAGULATION CONSULT NOTE  Pharmacy Consult for heparin   Indication: atrial fibrillation  No Known Allergies  Patient Measurements: Height: 5' 11 (180.3 cm) Weight: 95.4 kg (210 lb 5.1 oz) IBW/kg (Calculated) : 75.3 HEPARIN  DW (KG): 94.5  Vital Signs: Temp: 98 F (36.7 C) (08/17 2018) Temp Source: Oral (08/17 2018) BP: 112/64 (08/17 2018) Pulse Rate: 74 (08/17 2018)  Labs: Recent Labs    09/03/23 0118 09/03/23 0837 09/03/23 1036 09/04/23 0221 09/05/23 0222 09/05/23 2051  HGB 10.5*  --  10.8* 10.7* 10.2*  --   HCT 31.8*  --  33.8* 32.7* 31.4*  --   PLT 254  --  229 259 249  --   HEPARINUNFRC 0.26* 0.50  --   --   --  0.32  CREATININE 1.19  --  1.11 1.29* 2.36*  --     Estimated Creatinine Clearance: 33.8 mL/min (A) (by C-G formula based on SCr of 2.36 mg/dL (H)).   Assessment: 84 yoM presented to ED with abdominal/back pain and found to be in afib RVR. Pharmacy consulted to dose heparin  for afib. -No hx of afib or DOAC use.  Was on heparin  infusion from 8/13-8/15 - held after that time given bleeding and oozing from chest tube site. Plan to restart today. Was previously therapeutic on 1650 units/hr. Hgb 10.2, plt 249. Has been on subQ heparin  while heparin  infusion on hold. Scr up to 2.3 today - from 1.29 yesterday.    PM: HL is therapeutic at 0.32 on 1300 units/hr. No issues with the infusion or bleeding reported.  Goal of Therapy:  Heparin  level 0.3-0.7 units/ml Monitor platelets by anticoagulation protocol: Yes   Plan:   Continue heparin  infusion at 1300 units/hr F/u 8 hr confirmatory heparin  level (with AM labs) F/u long term anticoag plans  Thank you for allowing pharmacy to participate in this patient's care,  Rocky Slade, PharmD, BCPS 09/05/2023 9:19 PM  Please check AMION for all [facility] Pharmacy phone numbers After 10:00 PM, call Main Pharmacy [phone]

## 2023-09-05 NOTE — Progress Notes (Signed)
 PHARMACY - ANTICOAGULATION CONSULT NOTE  Pharmacy Consult for heparin   Indication: atrial fibrillation  No Known Allergies  Patient Measurements: Height: 5' 11 (180.3 cm) Weight: 95.4 kg (210 lb 5.1 oz) IBW/kg (Calculated) : 75.3 HEPARIN  DW (KG): 94.5  Vital Signs: Temp: 97.8 F (36.6 C) (08/17 1115) Temp Source: Oral (08/17 1115) BP: 109/59 (08/17 1115) Pulse Rate: 61 (08/17 1115)  Labs: Recent Labs    09/02/23 1605 09/03/23 0118 09/03/23 0118 09/03/23 0837 09/03/23 1036 09/04/23 0221 09/05/23 0222  HGB  --  10.5*   < >  --  10.8* 10.7* 10.2*  HCT  --  31.8*   < >  --  33.8* 32.7* 31.4*  PLT  --  254   < >  --  229 259 249  HEPARINUNFRC 0.14* 0.26*  --  0.50  --   --   --   CREATININE  --  1.19   < >  --  1.11 1.29* 2.36*   < > = values in this interval not displayed.    Estimated Creatinine Clearance: 33.8 mL/min (A) (by C-G formula based on SCr of 2.36 mg/dL (H)).   Assessment: 44 yoM presented to ED with abdominal/back pain and found to be in afib RVR. Pharmacy consulted to dose heparin  for afib. -No hx of afib or DOAC use.  Was on heparin  infusion from 8/13-8/15 - held after that time given bleeding and oozing from chest tube site. Plan to restart today. Was previously therapeutic on 1650 units/hr. Hgb 10.2, plt 249. Has been on subQ heparin  while heparin  infusion on hold. Scr up to 2.3 today - from 1.29 yesterday.    Goal of Therapy:  Heparin  level 0.3-0.7 units/ml Monitor platelets by anticoagulation protocol: Yes   Plan:   Will hold on bolus given recent bleeding Order heparin  infusion at 1300 units/hr F/u 8 hr heparin  level F/u long term anticoag plans  Thank you for allowing pharmacy to participate in this patient's care,  Suzen Sour, PharmD, BCCCP Clinical Pharmacist  Phone: [phone] 09/05/2023 12:03 PM  Please check AMION for all [facility] Pharmacy phone numbers After 10:00 PM, call Main Pharmacy [phone]

## 2023-09-05 NOTE — Progress Notes (Addendum)
 PROGRESS NOTE    Jacob [name]  FMW:[id number] DOB: March 18, 1950 DOA: 08/31/2023 PCP: [name], MD   Brief Narrative: Jacob [name] is a 73 y.o. male with a history of CAD s/p CABG, hyperlipidemia, hypertension, Crohn's disease, chronic low back pain.  Patient presented secondary to worsening back pain and right-sided chest pain. During hospitalization, patient developed atrial fibrillation with RVR, which was complicated by hypotension on diltiazem  drip. Patient's pain has been difficult to control. Worsening respiratory status. Pleural fluid concerning for possible empyema.   Assessment and Plan:  Acute on chronic low back pain History of multiple falls. MRI obtained and was significant for multilevel degenerative disc disease with foraminal/extraforaminal disc protrusions with resultant possible nerve root impingement; also with multilevel spinal stenosis and transitional lumbosacral anatomy. Neurosurgery was consulted and recommended no inpatient management and for outpatient pain management referral. Patient states they will follow-up with Dr. Louis, neurosurgery, as an outpatient. -Continue PT/OT -LSO brace while ambulating  Right sided pleuritic chest pain Point tenderness around the sixth rib. No associated known falls. CT imaging without evidence of fracture. Pleural effusion noted on that side, however, so injury could still be a possibility. Pain does not wrap around chest, so unlikely nerve related. Patient does have cholelithiasis, but I don't believe referred pain would manifest at his current area of pain. PCA pump started by PCCM and patient has been tolerating pain better. -Continue PCA pump  Right moderate pleural effusion Right-sided empyema secondary to MSSA Unclear etiology. Patient with a history of multiple falls so possibly inflammatory and related to injury. No symptoms concerning for infection, however gram stain significant for GPCs in clusters. Fluid  analysis significant for orange/cloudy fluid and significant WBC concerning for possibly chylous fluid. Triglycerides added. Fluid is exudative based on Light's Criteria. Pulmonology on board. CT chest abdomen/pelvis significant for no dissection and no PE but did show reaccumulation of right pleural fluid. PCCM admitted patient to the ICU and placed a right chest tube; Zosyn  added.  Fluid culture significant for MSSA bacteremia. -Discontinue vancomycin  and Zosyn  -Start cefazolin  IV -Follow-up pleural fluid culture and cytology -Follow-up pleural fluid triglycerides -Right-sided chest tube per PCCM -Consult infectious disease  Atrial fibrillation with RVR New diagnosis. Cardiology consulted. Patient started on diltiazem  IV but developed subsequent hypotension. Patient with persistent RVR at this time. Heparin  IV started. Patient converted to sinus rhythm on 8/15. -Cardiology recommendations: amiodarone , heparin  IV  Wheezing Patient with development of significant atelectasis likely secondary to splinting. -Albuterol as needed  AKI Likely ATN related to hypotension from diltiazem  IV drip. Worsened renal function. -IV fluids -Renal ultrasound -BMP in AM  Carotid artery stenosis Noted. Patient is s/p history of endarterectomy. -Continue aspirin  -Continue Lipitor  Constipation Secondary to narcotic use. -MiraLAX  -Colate  History of crohn's disease Noted.  Asymptomatic cholelithiasis Noted on ultrasound.   CAD No evidence of angina. Patient was on metoprolol  which is held. -Continue aspirin  and Lipitor  Primary hypertension Patient was managed on lisinopril  and amlodipine  which were discontinued after starting Cardizem .  Normocytic anemia Baseline hemoglobin appears to be around 11-12. Currently stable.   DVT prophylaxis: Heparin  IV Code Status:   Code Status: Full Code Family Communication: Wife and son at bedside Disposition Plan: Transfer to progressive  care   Consultants:  Cardiology Neurosurgery PCCM  Procedures:  Transthoracic Echocardiogram  Antimicrobials: Vancomycin  IV    Subjective: Continues to have improved pain. No issues noted overnight.  Objective: BP (!) 109/59 (BP Location: Left Arm)  Pulse 61   Temp 97.8 F (36.6 C) (Oral)   Resp 12   Ht 5' 11 (1.803 m)   Wt 95.4 kg   SpO2 99%   BMI 29.33 kg/m   Examination:  General exam: Appears calm and comfortable Respiratory system: Diminished. No wheezing. Respiratory effort normal. Cardiovascular system: S1 & S2 heard, RRR. Gastrointestinal system: Abdomen is nondistended, soft and nontender. Normal bowel sounds heard. Central nervous system: Alert and oriented. No focal neurological deficits. Musculoskeletal: No edema. No calf tenderness Psychiatry: Judgement and insight appear normal. Mood & affect appropriate.    Data Reviewed: I have personally reviewed following labs and imaging studies  CBC Lab Results  Component Value Date   WBC 10.2 09/05/2023   RBC 3.49 (L) 09/05/2023   HGB 10.2 (L) 09/05/2023   HCT 31.4 (L) 09/05/2023   MCV 90.0 09/05/2023   MCH 29.2 09/05/2023   PLT 249 09/05/2023   MCHC 32.5 09/05/2023   RDW 13.6 09/05/2023   LYMPHSABS 1.3 08/31/2023   MONOABS 0.9 08/31/2023   EOSABS 0.2 08/31/2023   BASOSABS 0.0 08/31/2023     Last metabolic panel Lab Results  Component Value Date   NA 133 (L) 09/05/2023   K 4.1 09/05/2023   CL 99 09/05/2023   CO2 23 09/05/2023   BUN 34 (H) 09/05/2023   CREATININE 2.36 (H) 09/05/2023   GLUCOSE 155 (H) 09/05/2023   GFRNONAA 29 (L) 09/05/2023   GFRAA 66 06/07/2019   CALCIUM  8.1 (L) 09/05/2023   PROT 6.3 (L) 09/04/2023   ALBUMIN 2.1 (L) 09/04/2023   LABGLOB 2.8 06/07/2019   AGRATIO 1.6 06/07/2019   BILITOT 0.5 09/04/2023   ALKPHOS 79 09/04/2023   AST 24 09/04/2023   ALT 37 09/04/2023   ANIONGAP 11 09/05/2023    GFR: Estimated Creatinine Clearance: 33.8 mL/min (A) (by C-G formula  based on SCr of 2.36 mg/dL (H)).  Recent Results (from the past 240 hours)  Culture, body fluid w Gram Stain-bottle     Status: Abnormal   Collection Time: 09/02/23 12:36 PM   Specimen: Pleura  Result Value Ref Range Status   Specimen Description PLEURAL RIGHT  Final   Special Requests NONE  Final   Gram Stain   Final    GRAM POSITIVE COCCI IN CLUSTERS IN BOTH AEROBIC AND ANAEROBIC BOTTLES CRITICAL RESULT CALLED TO, READ BACK BY AND VERIFIED WITH: RN LONELL MATSU [phone] FCP Performed at [facility] Lab, [address] [place], [place] [address]    Culture STAPHYLOCOCCUS AUREUS (A)  Final   Report Status 09/05/2023 FINAL  Final   Organism ID, Bacteria STAPHYLOCOCCUS AUREUS  Final      Susceptibility   Staphylococcus aureus - MIC*    CIPROFLOXACIN <=0.5 SENSITIVE Sensitive     ERYTHROMYCIN <=0.25 SENSITIVE Sensitive     GENTAMICIN <=0.5 SENSITIVE Sensitive     OXACILLIN <=0.25 SENSITIVE Sensitive     TETRACYCLINE <=1 SENSITIVE Sensitive     VANCOMYCIN  1 SENSITIVE Sensitive     TRIMETH/SULFA <=10 SENSITIVE Sensitive     CLINDAMYCIN <=0.25 SENSITIVE Sensitive     RIFAMPIN <=0.5 SENSITIVE Sensitive     Inducible Clindamycin NEGATIVE Sensitive     LINEZOLID 2 SENSITIVE Sensitive     * STAPHYLOCOCCUS AUREUS  Gram stain     Status: None   Collection Time: 09/02/23 12:36 PM   Specimen: Pleura  Result Value Ref Range Status   Specimen Description PLEURAL RIGHT  Final   Special Requests NONE  Final   Gram Stain   Final    RARE WBC PRESENT, PREDOMINANTLY PMN NO ORGANISMS SEEN Performed at [facility] Lab, [address] [place], [place] [address]    Report Status 09/02/2023 FINAL  Final  Culture, blood (Routine X 2) w Reflex to ID Panel     Status: None (Preliminary result)   Collection Time: 09/03/23 10:22 AM   Specimen: BLOOD RIGHT HAND  Result Value Ref Range Status   Specimen Description BLOOD RIGHT HAND  Final   Special Requests   Final    BOTTLES DRAWN AEROBIC AND  ANAEROBIC Blood Culture results may not be optimal due to an inadequate volume of blood received in culture bottles   Culture   Final    NO GROWTH 2 DAYS Performed at [facility], [address] [place], [place] [address]    Report Status PENDING  Incomplete  Culture, blood (Routine X 2) w Reflex to ID Panel     Status: None (Preliminary result)   Collection Time: 09/03/23 10:36 AM   Specimen: BLOOD LEFT HAND  Result Value Ref Range Status   Specimen Description BLOOD LEFT HAND  Final   Special Requests   Final    BOTTLES DRAWN AEROBIC ONLY Blood Culture adequate volume   Culture   Final    NO GROWTH 2 DAYS Performed at [facility] Lab, [address], [place], [place] [address]    Report Status PENDING  Incomplete  MRSA Next Gen by PCR, Nasal     Status: None   Collection Time: 09/03/23  2:22 PM   Specimen: Nasal Mucosa; Nasal Swab  Result Value Ref Range Status   MRSA by PCR Next Gen NOT DETECTED NOT DETECTED Final    Comment: (NOTE) The GeneXpert MRSA Assay (FDA approved for NASAL specimens only), is one component of a comprehensive MRSA colonization surveillance program. It is not intended to diagnose MRSA infection nor to guide or monitor treatment for MRSA infections. Test performance is not FDA approved in patients less than 51 years old. Performed at [facility] Lab, [address], [place], [place] [address]       Radiology Studies: US  RENAL Result Date: 09/05/2023 CLINICAL DATA:  Acute renal insufficiency, left renal cysts EXAM: RENAL / URINARY TRACT ULTRASOUND COMPLETE COMPARISON:  09/03/2023 FINDINGS: Right Kidney: Renal measurements: 10.4 x 5.2 x 5.2 cm = volume: 145.6 mL. Echogenicity within normal limits. No mass or hydronephrosis visualized. Left Kidney: Renal measurements: 11.5 x 4.7 x 6.2 cm = volume: 172.4 mL. Echogenicity within normal limits. No mass or hydronephrosis visualized. The simple left renal cysts seen on prior CT are not well  visualized on this exam, but do not require specific imaging follow-up. Bladder: Appears normal for degree of bladder distention. Other: None. IMPRESSION: 1. Unremarkable renal ultrasound. Electronically Signed   By: Ozell Daring M.D.   On: 09/05/2023 11:26   DG Chest Port 1 View Result Date: 09/03/2023 CLINICAL DATA:  Chest tube in place. EXAM: PORTABLE CHEST 1 VIEW COMPARISON:  Radiograph and CT earlier today FINDINGS: Right pigtail catheter coiled in the medial lower hemithorax. Diminished right pleural effusion. Persistent fluid tracking into the inter lobar fissure low lung volumes limit assessment. Stable heart size and mediastinal contours. Bronchovascular crowding related to low lung volumes. No visible pneumothorax. IMPRESSION: Right pigtail catheter in place with diminished right pleural effusion. Persistent small effusion and fluid tracking into the inter lobar fissure. No visible pneumothorax. Electronically Signed  By: Andrea Gasman M.D.   On: 09/03/2023 15:47      LOS: 5 days    Elgin Lam, MD [facility] 09/05/2023, 12:26 PM   If 7PM-7AM, please contact night-coverage [email or web address]

## 2023-09-05 NOTE — Plan of Care (Signed)

## 2023-09-06 ENCOUNTER — Inpatient Hospital Stay (HOSPITAL_COMMUNITY)

## 2023-09-06 ENCOUNTER — Encounter (HOSPITAL_COMMUNITY): Payer: Self-pay | Admitting: Family Medicine

## 2023-09-06 DIAGNOSIS — R0603 Acute respiratory distress: Secondary | ICD-10-CM | POA: Diagnosis not present

## 2023-09-06 DIAGNOSIS — I10 Essential (primary) hypertension: Secondary | ICD-10-CM | POA: Diagnosis not present

## 2023-09-06 DIAGNOSIS — R899 Unspecified abnormal finding in specimens from other organs, systems and tissues: Secondary | ICD-10-CM | POA: Diagnosis not present

## 2023-09-06 DIAGNOSIS — I4891 Unspecified atrial fibrillation: Secondary | ICD-10-CM | POA: Diagnosis not present

## 2023-09-06 DIAGNOSIS — J869 Pyothorax without fistula: Principal | ICD-10-CM

## 2023-09-06 LAB — BASIC METABOLIC PANEL WITH GFR
Anion gap: 6 (ref 5–15)
BUN: 18 mg/dL (ref 8–23)
CO2: 26 mmol/L (ref 22–32)
Calcium: 7.9 mg/dL — ABNORMAL LOW (ref 8.9–10.3)
Chloride: 105 mmol/L (ref 98–111)
Creatinine, Ser: 1.32 mg/dL — ABNORMAL HIGH (ref 0.61–1.24)
GFR, Estimated: 58 mL/min — ABNORMAL LOW (ref >60)
Glucose, Bld: 112 mg/dL — ABNORMAL HIGH (ref 70–99)
Potassium: 4 mmol/L (ref 3.5–5.1)
Sodium: 137 mmol/L (ref 135–145)

## 2023-09-06 LAB — HEPARIN LEVEL (UNFRACTIONATED): Heparin Unfractionated: 0.42 [IU]/mL (ref 0.30–0.70)

## 2023-09-06 LAB — CBC
HCT: 28.7 % — ABNORMAL LOW (ref 39.0–52.0)
Hemoglobin: 9.4 g/dL — ABNORMAL LOW (ref 13.0–17.0)
MCH: 29.6 pg (ref 26.0–34.0)
MCHC: 32.8 g/dL (ref 30.0–36.0)
MCV: 90.3 fL (ref 80.0–100.0)
Platelets: 259 K/uL (ref 150–400)
RBC: 3.18 MIL/uL — ABNORMAL LOW (ref 4.22–5.81)
RDW: 13.4 % (ref 11.5–15.5)
WBC: 9.9 K/uL (ref 4.0–10.5)
nRBC: 0 % (ref 0.0–0.2)

## 2023-09-06 LAB — HEPATITIS C ANTIBODY: HCV Ab: NONREACTIVE

## 2023-09-06 MED ORDER — HYDROMORPHONE HCL 1 MG/ML IJ SOLN
INTRAMUSCULAR | Status: AC
  Filled 2023-09-06: qty 0.5

## 2023-09-06 MED ORDER — HYDROMORPHONE HCL 1 MG/ML PO LIQD: 1.0000 mg | Freq: Once | ORAL | Status: DC | PRN

## 2023-09-06 MED ORDER — SODIUM CHLORIDE 0.9 % IV SOLN: INTRAVENOUS | Status: AC

## 2023-09-06 MED ORDER — HYDROMORPHONE HCL 1 MG/ML IJ SOLN
0.2500 mg | Freq: Once | INTRAMUSCULAR | Status: AC
  Administered 2023-09-06: 0.25 mg via INTRAVENOUS

## 2023-09-06 MED ORDER — STERILE WATER FOR INJECTION IJ SOLN
5.0000 mg | Freq: Once | RESPIRATORY_TRACT | Status: AC
  Administered 2023-09-06: 5 mg via INTRAPLEURAL
  Filled 2023-09-06: qty 5

## 2023-09-06 MED ORDER — SODIUM CHLORIDE (PF) 0.9 % IJ SOLN
10.0000 mg | Freq: Once | INTRAMUSCULAR | Status: AC
  Administered 2023-09-06: 10 mg via INTRAPLEURAL
  Filled 2023-09-06: qty 10

## 2023-09-06 NOTE — Progress Notes (Addendum)
 PHARMACY - ANTICOAGULATION CONSULT NOTE  Pharmacy Consult for heparin   Indication: atrial fibrillation  No Known Allergies  Patient Measurements: Height: 5' 11 (180.3 cm) Weight: 95.4 kg (210 lb 5.1 oz) IBW/kg (Calculated) : 75.3 HEPARIN  DW (KG): 94.5  Vital Signs: Temp: 97.9 F (36.6 C) (08/17 2348) Temp Source: Oral (08/17 2348) BP: 128/63 (08/17 2348) Pulse Rate: 75 (08/17 2348)  Labs: Recent Labs    09/03/23 0837 09/03/23 1036 09/03/23 1036 09/04/23 0221 09/05/23 0222 09/05/23 2051 09/06/23 0307  HGB  --  10.8*   < > 10.7* 10.2*  --  9.4*  HCT  --  33.8*   < > 32.7* 31.4*  --  28.7*  PLT  --  229   < > 259 249  --  259  HEPARINUNFRC 0.50  --   --   --   --  0.32 0.42  CREATININE  --  1.11  --  1.29* 2.36*  --   --    < > = values in this interval not displayed.    Estimated Creatinine Clearance: 33.8 mL/min (A) (by C-G formula based on SCr of 2.36 mg/dL (H)).   Assessment: 39 yoM presented to ED with abdominal/back pain and found to be in afib RVR. Pharmacy consulted to dose heparin  for afib. -No hx of afib or DOAC use.  Was on heparin  infusion from 8/13-8/15 - held after that time given bleeding and oozing from chest tube site. Plan to restart today. Was previously therapeutic on 1650 units/hr. Hgb 10.2, plt 249. Has been on subQ heparin  while heparin  infusion on hold. Scr up to 2.3 today - from 1.29 yesterday.    PM: HL is therapeutic at 0.32 on 1300 units/hr. No issues with the infusion or bleeding reported.  8/18 AM - heparin  level 0.42 therapeutic on 1300 units/hr.  Hgb down to 9.4, pltc 259.  S/p pleural lytics x3d (last 8/17) - per RN ~20cc out during her shift, no overt bleeding.   Per EP note 8/17, no plans for anticoagulation at discharge given history of frequent falls / AF thought to be 2/2 acute illness.  Goal of Therapy:  Heparin  level 0.3-0.7 units/ml Monitor platelets by anticoagulation protocol: Yes   Plan:   Continue heparin  infusion at  1300 units/hr Daily heparin  level, CBC Monitor s/sx bleeding  Thank you for allowing pharmacy to participate in this patient's care,  Maurilio Fila, PharmD Clinical Pharmacist 09/06/2023  3:58 AM

## 2023-09-06 NOTE — Procedures (Signed)
 Pleural Fibrinolytic Administration Procedure Note  Jacob [name]  [id number]  04/06/1950  Date:09/06/23  Time:5:50 PM   Provider Performing:Howell Groesbeck N Luchiano Viscomi   Procedure: Pleural Fibrinolysis Subsequent day [phone])  Indication(s) Fibrinolysis of complicated pleural effusion  Consent Risks of the procedure as well as the alternatives and risks of each were explained to the patient and/or caregiver.  Consent for the procedure was obtained.   Anesthesia None   Time Out Verified patient identification, verified procedure, site/side was marked, verified correct patient position, special equipment/implants available, medications/allergies/relevant history reviewed, required imaging and test results available.   Sterile Technique Hand hygiene, gloves   Procedure Description Existing pleural catheter was cleaned and accessed in sterile manner.  10mg  of tPA in 30cc of saline and 5mg  of dornase in 30cc of sterile water  were injected into pleural space using existing pleural catheter.  Catheter will be clamped for 1 hour and then placed back to suction.   Complications/Tolerance None; patient tolerated the procedure well.  EBL None   Specimen(s) None

## 2023-09-06 NOTE — Progress Notes (Signed)
 Physical Therapy Treatment Patient Details Name: Jacob [name] MRN: [id number] DOB: November 28, 1950 Today's Date: 09/06/2023   History of Present Illness 73 y.o. male admitted 8/12 with A-fib with RVR and severe back pain. MRI shows mild generalized spondylosis, L5-S1 facet arthropathy, and L3-4 degenerative disease. 8/15 Rt pleural effusion s/p pigtail catheter. PMH: Crohn's disease, CAD s/p CABG, CVA, carotid artery stenosis c/p CEA, HTN, HLD    PT Comments  Pt with PCA and reports improved back pain. Pt able to progress to hall ambulation with RW and assist for lines/tubes on RA without desaturation. Pt educated for back precautions for comfort, transfers, gait and DME use with OOB and ambulation encouraged. Wife present during session and agrees with plan for HHPT with currently improved mobility.   HR 81-85 126/76 (90) sitting 90-94% on RA    If plan is discharge home, recommend the following: A little help with walking and/or transfers;A lot of help with bathing/dressing/bathroom;Assistance with cooking/housework;Assist for transportation;Help with stairs or ramp for entrance   Can travel by private vehicle     Yes  Equipment Recommendations  None recommended by PT    Recommendations for Other Services       Precautions / Restrictions Precautions Precautions: Fall Recall of Precautions/Restrictions: Intact Precaution/Restrictions Comments: educated on back precautions for comfort     Mobility  Bed Mobility Overal bed mobility: Needs Assistance Bed Mobility: Rolling, Sidelying to Sit Rolling: Min assist Sidelying to sit: Min assist, HOB elevated       General bed mobility comments: HOB 25 degrees with min assist to roll and rise from side to sitting. supervision with cues to scoot to EOB    Transfers Overall transfer level: Needs assistance   Transfers: Sit to/from Stand Sit to Stand: Contact guard assist, From elevated surface           General transfer  comment: cues for hand placement and backing fully to surface to sit    Ambulation/Gait Ambulation/Gait assistance: Contact guard assist Gait Distance (Feet): 160 Feet Assistive device: Rolling walker (2 wheels) Gait Pattern/deviations: Step-through pattern, Decreased stride length, Trunk flexed   Gait velocity interpretation: <1.8 ft/sec, indicate of risk for recurrent falls   General Gait Details: cues for posture and proximity to RW, pt with tendency to leave feet trailing particularly with turning RW. pt self-regulating distance   Stairs             Wheelchair Mobility     Tilt Bed    Modified Rankin (Stroke Patients Only)       Balance Overall balance assessment: Needs assistance Sitting-balance support: No upper extremity supported, Feet supported Sitting balance-Leahy Scale: Fair Sitting balance - Comments: EOB without support   Standing balance support: Bilateral upper extremity supported, Reliant on assistive device for balance, During functional activity Standing balance-Leahy Scale: Poor Standing balance comment: RW in standing                            Communication Communication Communication: No apparent difficulties  Cognition Arousal: Alert, Suspect due to medications Behavior During Therapy: Flat affect   PT - Cognitive impairments: Problem solving                       PT - Cognition Comments: slow processing and anticipate due to medication Following commands: Intact      Cueing Cueing Techniques: Verbal cues  Exercises      General Comments  Pertinent Vitals/Pain Pain Assessment Pain Score: 6  Pain Location: 7/10 in bed initially, 5/10 during gait, 6/10 in chair after walking. PCA used prior to mobility Pain Descriptors / Indicators: Aching, Sore, Constant Pain Intervention(s): Limited activity within patient's tolerance, Monitored during session, PCA encouraged, Repositioned    Home Living                           Prior Function            PT Goals (current goals can now be found in the care plan section) Progress towards PT goals: Progressing toward goals    Frequency    Min 2X/week      PT Plan      Co-evaluation              AM-PAC PT 6 Clicks Mobility   Outcome Measure  Help needed turning from your back to your side while in a flat bed without using bedrails?: A Little Help needed moving from lying on your back to sitting on the side of a flat bed without using bedrails?: A Little Help needed moving to and from a bed to a chair (including a wheelchair)?: A Little Help needed standing up from a chair using your arms (e.g., wheelchair or bedside chair)?: A Little Help needed to walk in hospital room?: A Little Help needed climbing 3-5 steps with a railing? : A Lot 6 Click Score: 17    End of Session Equipment Utilized During Treatment: Oxygen;Gait belt Activity Tolerance: Patient tolerated treatment well Patient left: in chair;with call bell/phone within reach;with family/visitor present Nurse Communication: Mobility status PT Visit Diagnosis: Other abnormalities of gait and mobility (R26.89);Muscle weakness (generalized) (M62.81);History of falling (Z91.81);Difficulty in walking, not elsewhere classified (R26.2);Pain     Time: 1200-1238 PT Time Calculation (min) (ACUTE ONLY): 38 min  Charges:    $Gait Training: 8-22 mins $Therapeutic Activity: 23-37 mins PT General Charges $$ ACUTE PT VISIT: 1 Visit                     Lenoard SQUIBB, PT Acute Rehabilitation Services Office: [phone]    Lenoard NOVAK Kati Riggenbach 09/06/2023, 1:35 PM

## 2023-09-06 NOTE — Plan of Care (Signed)
   Problem: Education: Goal: Knowledge of General Education information will improve Description: Including pain rating scale, medication(s)/side effects and non-pharmacologic comfort measures Outcome: Progressing   Problem: Clinical Measurements: Goal: Respiratory complications will improve Outcome: Progressing   Problem: Coping: Goal: Level of anxiety will decrease Outcome: Progressing

## 2023-09-06 NOTE — Progress Notes (Signed)
 NAME:  Jacob [name], MRN:  [id number], DOB:  04-24-1950, LOS: 6 ADMISSION DATE:  08/31/2023, CONSULTATION DATE:  09/03/23 REFERRING MD:  Shlomo, CHIEF COMPLAINT:  back pain   History of Present Illness:  73 year old man w/ CAD, previously functional presenting with 1 month of worsening back pain and multiple falls.  Workup to date neg.  This time coming in with associated afib/RVR so admitted.  Imaging revealed R pleural effusion and this was drained yesterday.  This am began having R pleurisy associated with ongoing back pain and increasing dyspnea.  PCCM consulted for admission due to ongoing worsening dyspnea.   Pertinent  Medical History   Past Medical History:  Diagnosis Date   CAD (coronary artery disease), native coronary artery     multivessel ASCAD s/p CABG and s/p PCI of SVG to RI 2008   Carotid artery stenosis    patent right CEA site and 1-39% Lcarotid stenosis   Crohn's disease (HCC)    flare 2000   Depression    Dyslipidemia    Dyspnea    ED (erectile dysfunction)    History of blood transfusion 2005   Hypertension    Myocardial infarction (HCC) 2005   5 bypasses   Nephrolithiasis    Stroke (HCC)    TIA- Endartarectemy R side   TIA (transient ischemic attack)    Ulcerative colitis (HCC)    Left sided     Significant Hospital Events: Including procedures, antibiotic start and stop dates in addition to other pertinent events   8/14 admit 8/15 small bore chest tube placed, first dose of pleural lytics instilled 8/16 second dose of pleural lytics instilled 8/17 Am CXR pending, it appears Dwane was changed overnight   Interim History / Subjective:  Seen sitting up in bed with no acute complaints  Family at bedside   Objective    Blood pressure 133/68, pulse 72, temperature 98.4 F (36.9 C), temperature source Oral, resp. rate 18, height 5' 11 (1.803 m), weight 95.4 kg, SpO2 93%.    FiO2 (%):  [28 %] 28 %   Intake/Output Summary (Last 24 hours) at  09/06/2023 1319 Last data filed at 09/06/2023 1245 Gross per 24 hour  Intake 958.91 ml  Output 670 ml  Net 288.91 ml   Filed Weights   08/31/23 1830 09/01/23 1118  Weight: 91.6 kg 95.4 kg    Examination: General: Acute on chronically ill appearing elderly male sitting up in bed, in NAD HEENT: [place]/ATT, MM pink/moist, PERRL,  Neuro: Alert and oriented x3, generally weak  CV: s1s2 regular rate and rhythm, no murmur, rubs, or gallops,  PULM:  Diminished bilaterally, no increased work of breathing, no added breath sounds  GI: soft, bowel sounds active in all 4 quadrants, non-tender, non-distended, tolerating oral deit Extremities: warm/dry, no edema  Skin: no rashes or lesions  Resolved problem list   Assessment and Plan  Acute hypoxic respiratory failure likely due to pulmonary splinting and pleural effusion Right empyema with pan sensitive Staphylococcus Aureus seen on cultures Right sided pleuritic chest pain P: Check CXR to help determine need for additional lytics, repeat CXR ordered today and tomorrow. CXR with persistent basal R infiltrate. Will obtain CT chest tomorrow. Bedside US  today showing a lateral apical pocket still present  - Continue antibiotics, he will need at 3-4 weeks to cover his empyema. Can transition to oral antibiotic on discharge Routine chest tube care Scheduled flushing of chest tube Monitor chest tube output Daily chest  x-ray while chest tube in place Multimodal pain control  Remaining acute on chronic medical conditions managed per primary   I spent 46 minutes caring for this patient today, including preparing to see the patient, obtaining a medical history , reviewing a separately obtained history, performing a medically appropriate examination and/or evaluation, counseling and educating the patient/family/caregiver, ordering medications, tests, or procedures, referring and communicating with other health care professionals (not separately reported),  documenting clinical information in the electronic health record, independently interpreting results (not separately reported/billed) and communicating results to the patient/family/caregiver, and care coordination (not separately reported/billed)    Zola Herter, MD [facility] Office: [phone]

## 2023-09-06 NOTE — Progress Notes (Signed)
 [facility]  Date of Admission:  08/31/2023     Reason for Follow Up: Empyema/Pleural effusion  Total days of antibiotics 4         ASSESSMENT:  Jacob [name] is a 73 y/o caucasian male with complicated pleural effusion/right sided empyema with cultures growing MSSA s/p chest tube placement.   Jacob [name]'s blood cultures from 09/03/23 remain without growth to date. Chest tube continues to drain. Discussed plan of care to continue current dose of Cefazolin  and anticipate transition to oral treatment follow chest tube removal. Continue to monitor blood cultures for presence of bacteremia. Chest tube and fibrinolytic administration per Pulmonology.  Hepatitis C antibody negative. Continue standard/universal precautions. Remaining medical and supportive care per Internal Medicine.    PLAN:  Continue current dose of Cefazolin . Monitor blood cultures for bacteremia.  Chest tube and fibrinolytic management per Pulmonology. Remaining medical and supportive care per Internal Medicine.   Principal Problem:   Atrial fibrillation with RVR (HCC) Active Problems:   Coronary artery disease due to lipid rich plaque   Essential hypertension, benign   Crohn's colitis (HCC)   Acute on chronic back pain   Abdominal pain   Dizziness   Frequent falls   Acute low back pain    acetaminophen   1,000 mg Oral TID   alteplase  (CATHFLO ACTIVASE ) 10 mg in sodium chloride  (PF) 0.9 % 30 mL  10 mg Intrapleural Once   And   dornase alfa  (PULMOZYME ) 5 mg in sterile water  (preservative free) 30 mL  5 mg Intrapleural Once   amiodarone   200 mg Oral BID   aspirin  EC  81 mg Oral Daily   atorvastatin   80 mg Oral Daily   Chlorhexidine  Gluconate Cloth  6 each Topical Daily   docusate sodium   100 mg Oral BID   feeding supplement  237 mL Oral BID BM   gabapentin   100 mg Oral Q8H   HYDROmorphone    Intravenous Q4H   latanoprost   1 drop Both Eyes QHS   lidocaine   1 patch  Transdermal Q24H   And   lidocaine   1 patch Transdermal Q24H   lidocaine   10 mL Other Once   polyethylene glycol  17 g Oral BID   sodium chloride  flush  10 mL Intrapleural Q8H   sodium chloride  flush  3 mL Intravenous Q12H   tamsulosin   0.4 mg Oral QHS    SUBJECTIVE:  Afebrile overnight with no acute events. Tolerating antibiotics with no adverse side effects. Wife at bedside. Feeling better.   No Known Allergies   Review of Systems: Review of Systems  Constitutional:  Negative for chills, fever and weight loss.  Respiratory:  Negative for cough, shortness of breath and wheezing.   Cardiovascular:  Negative for chest pain and leg swelling.  Gastrointestinal:  Negative for abdominal pain, constipation, diarrhea, nausea and vomiting.  Skin:  Negative for rash.      OBJECTIVE: Vitals:   09/06/23 0810 09/06/23 1000 09/06/23 1124 09/06/23 1249  BP: 133/68     Pulse: 67 72    Resp: (!) 25  17 18   Temp: 98.1 F (36.7 C)  98.4 F (36.9 C)   TempSrc: Oral  Oral   SpO2: 98% 93%    Weight:      Height:       Body mass index is 29.33 kg/m.  Physical Exam Constitutional:      General: Jacob [name] is not in acute distress.    Appearance: Jacob [name]  is well-developed.  Cardiovascular:     Rate and Rhythm: Normal rate and regular rhythm.     Heart sounds: Normal heart sounds.  Pulmonary:     Effort: Pulmonary effort is normal.     Breath sounds: Normal breath sounds.     Comments: Chest tube in place.  Skin:    General: Skin is warm and dry.  Neurological:     Mental Status: Jacob [name] is alert.  Psychiatric:        Mood and Affect: Mood normal.     Lab Results Lab Results  Component Value Date   WBC 9.9 09/06/2023   HGB 9.4 (L) 09/06/2023   HCT 28.7 (L) 09/06/2023   MCV 90.3 09/06/2023   PLT 259 09/06/2023    Lab Results  Component Value Date   CREATININE 1.32 (H) 09/06/2023   BUN 18 09/06/2023   NA 137 09/06/2023   K 4.0 09/06/2023   CL 105 09/06/2023   CO2 26 09/06/2023     Lab Results  Component Value Date   ALT 37 09/04/2023   AST 24 09/04/2023   ALKPHOS 79 09/04/2023   BILITOT 0.5 09/04/2023     Microbiology: Recent Results (from the past 240 hours)  Culture, body fluid w Gram Stain-bottle     Status: Abnormal   Collection Time: 09/02/23 12:36 PM   Specimen: Pleura  Result Value Ref Range Status   Specimen Description PLEURAL RIGHT  Final   Special Requests NONE  Final   Gram Stain   Final    GRAM POSITIVE COCCI IN CLUSTERS IN BOTH AEROBIC AND ANAEROBIC BOTTLES CRITICAL RESULT CALLED TO, READ BACK BY AND VERIFIED WITH: RN LONELL MATSU [phone] FCP Performed at [facility], [address], [place], [place] [address]    Culture STAPHYLOCOCCUS AUREUS (A)  Final   Report Status 09/05/2023 FINAL  Final   Organism ID, Bacteria STAPHYLOCOCCUS AUREUS  Final      Susceptibility   Staphylococcus aureus - MIC*    CIPROFLOXACIN <=0.5 SENSITIVE Sensitive     ERYTHROMYCIN <=0.25 SENSITIVE Sensitive     GENTAMICIN <=0.5 SENSITIVE Sensitive     OXACILLIN <=0.25 SENSITIVE Sensitive     TETRACYCLINE <=1 SENSITIVE Sensitive     VANCOMYCIN  1 SENSITIVE Sensitive     TRIMETH/SULFA <=10 SENSITIVE Sensitive     CLINDAMYCIN <=0.25 SENSITIVE Sensitive     RIFAMPIN <=0.5 SENSITIVE Sensitive     Inducible Clindamycin NEGATIVE Sensitive     LINEZOLID 2 SENSITIVE Sensitive     * STAPHYLOCOCCUS AUREUS  Gram stain     Status: None   Collection Time: 09/02/23 12:36 PM   Specimen: Pleura  Result Value Ref Range Status   Specimen Description PLEURAL RIGHT  Final   Special Requests NONE  Final   Gram Stain   Final    RARE WBC PRESENT, PREDOMINANTLY PMN NO ORGANISMS SEEN Performed at [facility], [address], [place], [place] [address]    Report Status 09/02/2023 FINAL  Final  Culture, blood (Routine X 2) w Reflex to ID Panel     Status: None (Preliminary result)   Collection Time: 09/03/23 10:22 AM   Specimen: BLOOD RIGHT HAND  Result Value Ref  Range Status   Specimen Description BLOOD RIGHT HAND  Final   Special Requests   Final    BOTTLES DRAWN AEROBIC AND ANAEROBIC Blood Culture results may not be optimal due to an inadequate volume of blood received in culture bottles  Culture   Final    NO GROWTH 3 DAYS Performed at [facility], [address], [place], [place] [address]    Report Status PENDING  Incomplete  Culture, blood (Routine X 2) w Reflex to ID Panel     Status: None (Preliminary result)   Collection Time: 09/03/23 10:36 AM   Specimen: BLOOD LEFT HAND  Result Value Ref Range Status   Specimen Description BLOOD LEFT HAND  Final   Special Requests   Final    BOTTLES DRAWN AEROBIC ONLY Blood Culture adequate volume   Culture   Final    NO GROWTH 3 DAYS Performed at [facility], [address], [place], [place] [address]    Report Status PENDING  Incomplete  MRSA Next Gen by PCR, Nasal     Status: None   Collection Time: 09/03/23  2:22 PM   Specimen: Nasal Mucosa; Nasal Swab  Result Value Ref Range Status   MRSA by PCR Next Gen NOT DETECTED NOT DETECTED Final    Comment: (NOTE) The GeneXpert MRSA Assay (FDA approved for NASAL specimens only), is one component of a comprehensive MRSA colonization surveillance program. It is not intended to diagnose MRSA infection nor to guide or monitor treatment for MRSA infections. Test performance is not FDA approved in patients less than 12 years old. Performed at [facility], [address], [place], [place] [address]     I have personally spent 30 minutes involved in face-to-face and non-face-to-face activities for this patient on the day of the visit. Professional time spent includes the following activities: preparing to see the patient (review of tests), performing a medically appropriate examination, ordering medications, communicating with other health care professionals, documenting clinical information in the EMR, communicating results and  counseling patient and family regarding medication and plan of care, and care coordination.    Greg Trysta Showman, NP [facility]  09/06/2023  12:59 PM

## 2023-09-06 NOTE — Progress Notes (Signed)
 Progress Note  Patient Name: Jacob [name] Date of Encounter: 09/06/2023  [facility] Cardiologist: Wilbert Bihari, MD    Subjective   No cardiac complaints  Inpatient Medications    Scheduled Meds:  acetaminophen   1,000 mg Oral TID   amiodarone   200 mg Oral BID   aspirin  EC  81 mg Oral Daily   atorvastatin   80 mg Oral Daily   Chlorhexidine  Gluconate Cloth  6 each Topical Daily   docusate sodium   100 mg Oral BID   feeding supplement  237 mL Oral BID BM   gabapentin   100 mg Oral Q8H   HYDROmorphone    Intravenous Q4H   latanoprost   1 drop Both Eyes QHS   lidocaine   1 patch Transdermal Q24H   And   lidocaine   1 patch Transdermal Q24H   lidocaine   10 mL Other Once   polyethylene glycol  17 g Oral BID   sodium chloride  flush  10 mL Intrapleural Q8H   sodium chloride  flush  10 mL Intrapleural Q8H   sodium chloride  flush  10 mL Intrapleural Q8H   sodium chloride  flush  3 mL Intravenous Q12H   tamsulosin   0.4 mg Oral QHS   Continuous Infusions:   ceFAZolin  (ANCEF ) IV Stopped (09/06/23 0437)   heparin  1,300 Units/hr (09/06/23 0700)   sodium chloride      PRN Meds: bisacodyl , diazepam , diphenhydrAMINE  **OR** diphenhydrAMINE , diphenhydrAMINE -zinc  acetate, naloxone  **AND** sodium chloride  flush, prochlorperazine    Vital Signs    Vitals:   09/05/23 2348 09/06/23 0037 09/06/23 0408 09/06/23 0810  BP: 128/63  126/69 133/68  Pulse: 75  66 67  Resp: 17 16 18  (!) 25  Temp: 97.9 F (36.6 C)  97.8 F (36.6 C) 98.1 F (36.7 C)  TempSrc: Oral  Oral Oral  SpO2: 97% 97% 98% 98%  Weight:      Height:        Intake/Output Summary (Last 24 hours) at 09/06/2023 0820 Last data filed at 09/06/2023 0700 Gross per 24 hour  Intake 656.86 ml  Output 440 ml  Net 216.86 ml      09/01/2023   11:18 AM 08/31/2023    6:30 PM 08/28/2023    1:14 PM  Last 3 Weights  Weight (lbs) 210 lb 5.1 oz 202 lb 214 lb  Weight (kg) 95.4 kg 91.627 kg 97.07 kg      Telemetry    Atrial  fibrillation with RVR as high as the 160's- Personally Reviewed  ECG    No new EKG to review- Personally Reviewed  Physical Exam   Alert Chest tube right lung No murmur prior sternotomy Prior right CEA Abdomen benign Trace edema Labs    High Sensitivity Troponin:   Recent Labs  Lab 08/31/23 1856 08/31/23 2012  TROPONINIHS 5 4      Chemistry Recent Labs  Lab 08/31/23 1856 09/01/23 0450 09/02/23 0424 09/03/23 0118 09/04/23 0221 09/05/23 0222 09/06/23 0307  NA 137   < >  --    < > 130* 133* 137  K 3.9   < >  --    < > 4.1 4.1 4.0  CL 102   < >  --    < > 96* 99 105  CO2 24   < >  --    < > 23 23 26   GLUCOSE 108*   < >  --    < > 126* 155* 112*  BUN 23   < >  --    < >  19 34* 18  CREATININE 1.15   < >  --    < > 1.29* 2.36* 1.32*  CALCIUM  8.5*   < >  --    < > 8.4* 8.1* 7.9*  PROT 6.9  --  5.8*  --  6.3*  --   --   ALBUMIN 2.7*  --  2.2*  --  2.1*  --   --   AST 23  --   --   --  24  --   --   ALT 23  --   --   --  37  --   --   ALKPHOS 71  --   --   --  79  --   --   BILITOT 0.5  --   --   --  0.5  --   --   GFRNONAA >60   < >  --    < > 59* 29* 58*  ANIONGAP 11   < >  --    < > 11 11 6    < > = values in this interval not displayed.     Hematology Recent Labs  Lab 09/04/23 0221 09/05/23 0222 09/06/23 0307  WBC 10.4 10.2 9.9  RBC 3.65* 3.49* 3.18*  HGB 10.7* 10.2* 9.4*  HCT 32.7* 31.4* 28.7*  MCV 89.6 90.0 90.3  MCH 29.3 29.2 29.6  MCHC 32.7 32.5 32.8  RDW 13.3 13.6 13.4  PLT 259 249 259    BNP Recent Labs  Lab 08/31/23 2012  BNP 154.1*     DDimer  Recent Labs  Lab 08/31/23 1856  DDIMER 1.50*     CHA2DS2-VASc Score = 5   This indicates a 7.2% annual risk of stroke. The patient's score is based upon: CHF History: 0 HTN History: 1 Diabetes History: 0 Stroke History: 2 Vascular Disease History: 1 Age Score: 1 Gender Score: 0     Radiology    DG CHEST PORT 1 VIEW Result Date: 09/05/2023 CLINICAL DATA:  [id number] Chest tube in  place [id number] EXAM: PORTABLE CHEST 1 VIEW COMPARISON:  Chest x-ray 09/03/2023 FINDINGS: Patient is rotated limiting evaluation. Redemonstration of a right chest tube with pigtail overlying the right hemidiaphragm with persistent kinking. The heart and mediastinal contours are within normal limits. Right mid to lower lung zone patchy airspace opacities. No pulmonary edema. Persistent at least trace to small volume right pleural effusion. No pneumothorax. No acute osseous abnormality. IMPRESSION: 1. Persistent at least trace to small volume right pleural effusion. 2. Right mid to lower lung zone patchy airspace opacities. 3. Redemonstration of a right chest tube with pigtail overlying the right hemidiaphragm with persistent kinking. Electronically Signed   By: Morgane  Naveau M.D.   On: 09/05/2023 13:59   US  RENAL Result Date: 09/05/2023 CLINICAL DATA:  Acute renal insufficiency, left renal cysts EXAM: RENAL / URINARY TRACT ULTRASOUND COMPLETE COMPARISON:  09/03/2023 FINDINGS: Right Kidney: Renal measurements: 10.4 x 5.2 x 5.2 cm = volume: 145.6 mL. Echogenicity within normal limits. No mass or hydronephrosis visualized. Left Kidney: Renal measurements: 11.5 x 4.7 x 6.2 cm = volume: 172.4 mL. Echogenicity within normal limits. No mass or hydronephrosis visualized. The simple left renal cysts seen on prior CT are not well visualized on this exam, but do not require specific imaging follow-up. Bladder: Appears normal for degree of bladder distention. Other: None. IMPRESSION: 1. Unremarkable renal ultrasound. Electronically Signed   By: Ozell Daring M.D.   On: 09/05/2023 11:26  Patient Profile     73 y.o. male  with a hx of hypertension, hyperlipidemia, carotid artery stenosis s/p endarterectomy in 2018, prior TIA, prior stroke, Crohn's disease, ulcerative colitis, and multivessel CAD s/p CABG 2005 and PCI to SVG to RI in 2008 who is being seen 09/01/2023 for the evaluation of new onset atrial fibrillation at  the request of CHRISTOBAL Guadalajara MD.   Assessment & Plan    #New onset atrial fibrillation with RVR - suspect AF was triggered by the intrathoracic infection - NSR on oral amiodarone  now  - 2D echo -- mild MR, normal biatrial size.   - TSH normal 1.5 - CHADS2VASC score  5 (hypertension, age> 65, history of CVA, CAD) -no long term oral anticoagulation per Dr Nancey EP due to frequent falls - Currently on IV heparin  drip - amiodarone  -- 200mg  BID    # Back Pain - Neurosurgery consult appreciated for acute on chronic low back pain with MRI showing mild generalized spondylolysis and DJD.  Plan is for pain management follow-up outpatient    #ASCAD #Hyperlipidemia -s/p remote history of CABG 2005 -s/p  PCI of SVG to RI in 2008  -Denies any recent anginal symptoms prior to having all the issues with back pain but then started having sharp chest pain in the right chest that seem to radiate from the back into the right chest -High-sensitivity troponin is normal (5>>4) -2D echo ok -Continue aspirin  81 mg daily, atorvastatin  80 mg daily    For questions or updates, please contact [facility] Please consult [email or web address] for contact info under        Signed, Maude Emmer, MD  09/06/2023, 8:20 AM

## 2023-09-06 NOTE — Progress Notes (Signed)
 eLink Physician-Brief Progress Note Patient Name: Jacob [name] DOB: 1950/07/20 MRN: [id number]   Date of Service  09/06/2023  HPI/Events of Note  Pt reported to have increased output per chest tube - approximately 1.2L over the past few hours.  Output now reported to be dark red.  Pt otherwise hemodynamically stable, does not appear to be in respiratory distress  eICU Interventions  Will check repeat CXR.  Will take chest tube off wall suction for now.         Ahaana Rochette M DELA CRUZ 09/06/2023, 10:28 PM  1:50 AM Repeat CXR shows slight improvement of effusion.  Still continues to have increased output, out in the past hour.  Hgb downtrending. 7.8 on recheck.  Pt remains stable hemodynamically.

## 2023-09-06 NOTE — Progress Notes (Signed)
 PROGRESS NOTE    BLESS BELSHE  FMW:[id number] DOB: 03-29-1950 DOA: 08/31/2023 PCP: [name], MD   Brief Narrative: Jacob [name] is a 72 y.o. male with a history of CAD s/p CABG, hyperlipidemia, hypertension, Crohn's disease, chronic low back pain.  Patient presented secondary to worsening back pain and right-sided chest pain. During hospitalization, patient developed atrial fibrillation with RVR, which was complicated by hypotension on diltiazem  drip. Patient's pain has been difficult to control. Worsening respiratory status. Pleural fluid concerning for possible empyema.   Assessment and Plan:  Acute on chronic low back pain History of multiple falls. MRI obtained and was significant for multilevel degenerative disc disease with foraminal/extraforaminal disc protrusions with resultant possible nerve root impingement; also with multilevel spinal stenosis and transitional lumbosacral anatomy. Neurosurgery was consulted and recommended no inpatient management and for outpatient pain management referral. Patient states they will follow-up with Dr. Louis, neurosurgery, as an outpatient. -Continue PT/OT -LSO brace while ambulating  Right sided pleuritic chest pain Point tenderness around the sixth rib. No associated known falls. CT imaging without evidence of fracture. Pleural effusion noted on that side, however, so injury could still be a possibility. Pain does not wrap around chest, so unlikely nerve related. Patient does have cholelithiasis, but I don't believe referred pain would manifest at his current area of pain. PCA pump started by PCCM and patient has been tolerating pain better. -Continue PCA pump  Right moderate pleural effusion Right-sided empyema secondary to MSSA Unclear etiology. Patient with a history of multiple falls so possibly inflammatory and related to injury. No symptoms concerning for infection, however gram stain significant for GPCs in clusters. Fluid  analysis significant for orange/cloudy fluid and significant WBC concerning for possibly chylous fluid. Triglycerides added. Fluid is exudative based on Light's Criteria. Pulmonology on board. CT chest abdomen/pelvis significant for no dissection and no PE but did show reaccumulation of right pleural fluid. PCCM admitted patient to the ICU and placed a right chest tube; Zosyn  added.  Fluid culture significant for MSSA bacteremia. Cytology with no malignant cells identified. -ID recommendations: Continue cefazolin  IV -Follow-up pleural fluid triglycerides -Right-sided chest tube per PCCM  Atrial fibrillation with RVR New diagnosis. Cardiology consulted. Patient started on diltiazem  IV but developed subsequent hypotension. Patient with persistent RVR at this time. Heparin  IV started. Patient converted to sinus rhythm on 8/15. -Cardiology recommendations: amiodarone  PO, heparin  IV  Wheezing Patient with development of significant atelectasis likely secondary to splinting. -Albuterol as needed  AKI Likely ATN related to hypotension from diltiazem  IV drip. Worsened renal function. Renal ultrasound reassuring. IV fluid bolus given and continuous fluids continued. Creatinine improving. -IV fluids -BMP in AM  Carotid artery stenosis Noted. Patient is s/p history of endarterectomy. -Continue aspirin  -Continue Lipitor  Constipation Secondary to narcotic use. Patient reports multiple bowel movements. Abdomen remains distended with bowel sounds. -MiraLAX  -Colate  History of crohn's disease Noted.  Asymptomatic cholelithiasis Noted on ultrasound.   CAD No evidence of angina. Patient was on metoprolol  which is held. -Continue aspirin  and Lipitor  Primary hypertension Patient was managed on lisinopril  and amlodipine  which were discontinued after starting Cardizem .  Normocytic anemia Baseline hemoglobin appears to be around 11-12. Currently stable.   DVT prophylaxis: Heparin  IV Code  Status:   Code Status: Full Code Family Communication: Wife at bedside Disposition Plan: Discharge pending continued medical workup/management; anticipate discharge in several days   Consultants:  Cardiology Neurosurgery PCCM Infectious disease  Procedures:  Transthoracic Echocardiogram Chest tube  placement  Antimicrobials: Vancomycin  IV Zosyn  IV Cefazolin  IV   Subjective: Pain still present but is improving overall.  Objective: BP 133/68 (BP Location: Left Arm)   Pulse 72   Temp 98.4 F (36.9 C) (Oral)   Resp 17   Ht 5' 11 (1.803 m)   Wt 95.4 kg   SpO2 93%   BMI 29.33 kg/m   Examination:  General exam: Appears calm and comfortable Respiratory system: Clear to auscultation except rales noted in RLL. Respiratory effort normal. Cardiovascular system: S1 & S2 heard, RRR. No murmurs. Gastrointestinal system: Abdomen is distended, soft and nontender. Normal bowel sounds heard. Central nervous system: Alert and oriented. No focal neurological deficits. Musculoskeletal: No edema. No calf tenderness Psychiatry: Judgement and insight appear normal. Mood & affect appropriate.    Data Reviewed: I have personally reviewed following labs and imaging studies  CBC Lab Results  Component Value Date   WBC 9.9 09/06/2023   RBC 3.18 (L) 09/06/2023   HGB 9.4 (L) 09/06/2023   HCT 28.7 (L) 09/06/2023   MCV 90.3 09/06/2023   MCH 29.6 09/06/2023   PLT 259 09/06/2023   MCHC 32.8 09/06/2023   RDW 13.4 09/06/2023   LYMPHSABS 1.3 08/31/2023   MONOABS 0.9 08/31/2023   EOSABS 0.2 08/31/2023   BASOSABS 0.0 08/31/2023     Last metabolic panel Lab Results  Component Value Date   NA 137 09/06/2023   K 4.0 09/06/2023   CL 105 09/06/2023   CO2 26 09/06/2023   BUN 18 09/06/2023   CREATININE 1.32 (H) 09/06/2023   GLUCOSE 112 (H) 09/06/2023   GFRNONAA 58 (L) 09/06/2023   GFRAA 66 06/07/2019   CALCIUM  7.9 (L) 09/06/2023   PROT 6.3 (L) 09/04/2023   ALBUMIN 2.1 (L) 09/04/2023    LABGLOB 2.8 06/07/2019   AGRATIO 1.6 06/07/2019   BILITOT 0.5 09/04/2023   ALKPHOS 79 09/04/2023   AST 24 09/04/2023   ALT 37 09/04/2023   ANIONGAP 6 09/06/2023    GFR: Estimated Creatinine Clearance: 60.5 mL/min (A) (by C-G formula based on SCr of 1.32 mg/dL (H)).  Recent Results (from the past 240 hours)  Culture, body fluid w Gram Stain-bottle     Status: Abnormal   Collection Time: 09/02/23 12:36 PM   Specimen: Pleura  Result Value Ref Range Status   Specimen Description PLEURAL RIGHT  Final   Special Requests NONE  Final   Gram Stain   Final    GRAM POSITIVE COCCI IN CLUSTERS IN BOTH AEROBIC AND ANAEROBIC BOTTLES CRITICAL RESULT CALLED TO, READ BACK BY AND VERIFIED WITH: RN LONELL MATSU [phone] FCP Performed at [facility], [address], [place], [place] [address]    Culture STAPHYLOCOCCUS AUREUS (A)  Final   Report Status 09/05/2023 FINAL  Final   Organism ID, Bacteria STAPHYLOCOCCUS AUREUS  Final      Susceptibility   Staphylococcus aureus - MIC*    CIPROFLOXACIN <=0.5 SENSITIVE Sensitive     ERYTHROMYCIN <=0.25 SENSITIVE Sensitive     GENTAMICIN <=0.5 SENSITIVE Sensitive     OXACILLIN <=0.25 SENSITIVE Sensitive     TETRACYCLINE <=1 SENSITIVE Sensitive     VANCOMYCIN  1 SENSITIVE Sensitive     TRIMETH/SULFA <=10 SENSITIVE Sensitive     CLINDAMYCIN <=0.25 SENSITIVE Sensitive     RIFAMPIN <=0.5 SENSITIVE Sensitive     Inducible Clindamycin NEGATIVE Sensitive     LINEZOLID 2 SENSITIVE Sensitive     * STAPHYLOCOCCUS AUREUS  Gram stain  Status: None   Collection Time: 09/02/23 12:36 PM   Specimen: Pleura  Result Value Ref Range Status   Specimen Description PLEURAL RIGHT  Final   Special Requests NONE  Final   Gram Stain   Final    RARE WBC PRESENT, PREDOMINANTLY PMN NO ORGANISMS SEEN Performed at [facility], [address] [place], [place] [address]    Report Status 09/02/2023 FINAL  Final  Culture, blood (Routine X 2) w Reflex to ID Panel      Status: None (Preliminary result)   Collection Time: 09/03/23 10:22 AM   Specimen: BLOOD RIGHT HAND  Result Value Ref Range Status   Specimen Description BLOOD RIGHT HAND  Final   Special Requests   Final    BOTTLES DRAWN AEROBIC AND ANAEROBIC Blood Culture results may not be optimal due to an inadequate volume of blood received in culture bottles   Culture   Final    NO GROWTH 3 DAYS Performed at [facility], [address] [place], [place] [address]    Report Status PENDING  Incomplete  Culture, blood (Routine X 2) w Reflex to ID Panel     Status: None (Preliminary result)   Collection Time: 09/03/23 10:36 AM   Specimen: BLOOD LEFT HAND  Result Value Ref Range Status   Specimen Description BLOOD LEFT HAND  Final   Special Requests   Final    BOTTLES DRAWN AEROBIC ONLY Blood Culture adequate volume   Culture   Final    NO GROWTH 3 DAYS Performed at [facility], [address], [place], [place] [address]    Report Status PENDING  Incomplete  MRSA Next Gen by PCR, Nasal     Status: None   Collection Time: 09/03/23  2:22 PM   Specimen: Nasal Mucosa; Nasal Swab  Result Value Ref Range Status   MRSA by PCR Next Gen NOT DETECTED NOT DETECTED Final    Comment: (NOTE) The GeneXpert MRSA Assay (FDA approved for NASAL specimens only), is one component of a comprehensive MRSA colonization surveillance program. It is not intended to diagnose MRSA infection nor to guide or monitor treatment for MRSA infections. Test performance is not FDA approved in patients less than 16 years old. Performed at [facility], [address], [place], [place] [address]       Radiology Studies: DG CHEST PORT 1 VIEW Result Date: 09/05/2023 CLINICAL DATA:  [id number] Chest tube in place [id number] EXAM: PORTABLE CHEST 1 VIEW COMPARISON:  Chest x-ray 09/03/2023 FINDINGS: Patient is rotated limiting evaluation. Redemonstration of a right chest tube with pigtail overlying the right  hemidiaphragm with persistent kinking. The heart and mediastinal contours are within normal limits. Right mid to lower lung zone patchy airspace opacities. No pulmonary edema. Persistent at least trace to small volume right pleural effusion. No pneumothorax. No acute osseous abnormality. IMPRESSION: 1. Persistent at least trace to small volume right pleural effusion. 2. Right mid to lower lung zone patchy airspace opacities. 3. Redemonstration of a right chest tube with pigtail overlying the right hemidiaphragm with persistent kinking. Electronically Signed   By: Morgane  Naveau M.D.   On: 09/05/2023 13:59   US  RENAL Result Date: 09/05/2023 CLINICAL DATA:  Acute renal insufficiency, left renal cysts EXAM: RENAL / URINARY TRACT ULTRASOUND COMPLETE COMPARISON:  09/03/2023 FINDINGS: Right Kidney: Renal measurements: 10.4 x 5.2 x 5.2 cm = volume: 145.6 mL. Echogenicity within normal limits. No mass or hydronephrosis visualized. Left Kidney:  Renal measurements: 11.5 x 4.7 x 6.2 cm = volume: 172.4 mL. Echogenicity within normal limits. No mass or hydronephrosis visualized. The simple left renal cysts seen on prior CT are not well visualized on this exam, but do not require specific imaging follow-up. Bladder: Appears normal for degree of bladder distention. Other: None. IMPRESSION: 1. Unremarkable renal ultrasound. Electronically Signed   By: Ozell Daring M.D.   On: 09/05/2023 11:26      LOS: 6 days    Elgin Lam, MD [facility] 09/06/2023, 12:22 PM   If 7PM-7AM, please contact night-coverage [email or web address]

## 2023-09-07 ENCOUNTER — Inpatient Hospital Stay (HOSPITAL_COMMUNITY)

## 2023-09-07 DIAGNOSIS — A4101 Sepsis due to Methicillin susceptible Staphylococcus aureus: Secondary | ICD-10-CM

## 2023-09-07 DIAGNOSIS — I10 Essential (primary) hypertension: Secondary | ICD-10-CM | POA: Diagnosis not present

## 2023-09-07 DIAGNOSIS — J869 Pyothorax without fistula: Secondary | ICD-10-CM

## 2023-09-07 DIAGNOSIS — D649 Anemia, unspecified: Secondary | ICD-10-CM

## 2023-09-07 DIAGNOSIS — J942 Hemothorax: Secondary | ICD-10-CM

## 2023-09-07 DIAGNOSIS — I4891 Unspecified atrial fibrillation: Secondary | ICD-10-CM

## 2023-09-07 DIAGNOSIS — R899 Unspecified abnormal finding in specimens from other organs, systems and tissues: Secondary | ICD-10-CM | POA: Diagnosis not present

## 2023-09-07 DIAGNOSIS — R0603 Acute respiratory distress: Secondary | ICD-10-CM | POA: Diagnosis not present

## 2023-09-07 LAB — BASIC METABOLIC PANEL WITH GFR
Anion gap: 8 (ref 5–15)
Anion gap: 7 (ref 5–15)
BUN: 19 mg/dL (ref 8–23)
BUN: 18 mg/dL (ref 8–23)
CO2: 24 mmol/L (ref 22–32)
CO2: 24 mmol/L (ref 22–32)
Calcium: 8 mg/dL — ABNORMAL LOW (ref 8.9–10.3)
Calcium: 7.6 mg/dL — ABNORMAL LOW (ref 8.9–10.3)
Chloride: 103 mmol/L (ref 98–111)
Chloride: 102 mmol/L (ref 98–111)
Creatinine, Ser: 1.21 mg/dL (ref 0.61–1.24)
Creatinine, Ser: 1.11 mg/dL (ref 0.61–1.24)
GFR, Estimated: >60 mL/min (ref >60)
GFR, Estimated: >60 mL/min (ref >60)
Glucose, Bld: 141 mg/dL — ABNORMAL HIGH (ref 70–99)
Glucose, Bld: 125 mg/dL — ABNORMAL HIGH (ref 70–99)
Potassium: 4.3 mmol/L (ref 3.5–5.1)
Potassium: 4.1 mmol/L (ref 3.5–5.1)
Sodium: 135 mmol/L (ref 135–145)
Sodium: 133 mmol/L — ABNORMAL LOW (ref 135–145)

## 2023-09-07 LAB — CBC
HCT: 24.1 % — ABNORMAL LOW (ref 39.0–52.0)
HCT: 20.3 % — ABNORMAL LOW (ref 39.0–52.0)
Hemoglobin: 7.8 g/dL — ABNORMAL LOW (ref 13.0–17.0)
Hemoglobin: 6.6 g/dL — CL (ref 13.0–17.0)
MCH: 29.3 pg (ref 26.0–34.0)
MCH: 29.7 pg (ref 26.0–34.0)
MCHC: 32.4 g/dL (ref 30.0–36.0)
MCHC: 32.5 g/dL (ref 30.0–36.0)
MCV: 90.6 fL (ref 80.0–100.0)
MCV: 91.4 fL (ref 80.0–100.0)
Platelets: 315 K/uL (ref 150–400)
Platelets: 316 K/uL (ref 150–400)
RBC: 2.66 MIL/uL — ABNORMAL LOW (ref 4.22–5.81)
RBC: 2.22 MIL/uL — ABNORMAL LOW (ref 4.22–5.81)
RDW: 13.7 % (ref 11.5–15.5)
RDW: 13.5 % (ref 11.5–15.5)
WBC: 12.9 K/uL — ABNORMAL HIGH (ref 4.0–10.5)
WBC: 12.7 K/uL — ABNORMAL HIGH (ref 4.0–10.5)
nRBC: 0 % (ref 0.0–0.2)
nRBC: 0 % (ref 0.0–0.2)

## 2023-09-07 LAB — TRIGLYCERIDES, BODY FLUIDS: Triglycerides, Fluid: 31 mg/dL

## 2023-09-07 LAB — PREPARE RBC (CROSSMATCH)

## 2023-09-07 LAB — HEMOGLOBIN AND HEMATOCRIT, BLOOD
HCT: 22.4 % — ABNORMAL LOW (ref 39.0–52.0)
Hemoglobin: 7.7 g/dL — ABNORMAL LOW (ref 13.0–17.0)

## 2023-09-07 LAB — HEPARIN LEVEL (UNFRACTIONATED): Heparin Unfractionated: 0.13 [IU]/mL — ABNORMAL LOW (ref 0.30–0.70)

## 2023-09-07 MED ORDER — SENNOSIDES-DOCUSATE SODIUM 8.6-50 MG PO TABS
1.0000 | ORAL_TABLET | Freq: Two times a day (BID) | ORAL | Status: DC
  Administered 2023-09-10: 1 via ORAL
  Filled 2023-09-07 (×2): qty 1

## 2023-09-07 MED ORDER — SODIUM CHLORIDE 0.9% IV SOLUTION: Freq: Once | INTRAVENOUS | Status: DC

## 2023-09-07 MED ORDER — IOHEXOL 350 MG/ML SOLN
75.0000 mL | Freq: Once | INTRAVENOUS | Status: AC | PRN
  Administered 2023-09-07: 75 mL via INTRAVENOUS

## 2023-09-07 NOTE — Progress Notes (Signed)
 Progress Note  Patient Name: Jacob [name] Date of Encounter: 09/07/2023  [facility] Cardiologist: Wilbert Bihari, MD    Subjective   No cardiac complaints still in NSR   Inpatient Medications    Scheduled Meds:  acetaminophen   1,000 mg Oral TID   amiodarone   200 mg Oral BID   aspirin  EC  81 mg Oral Daily   atorvastatin   80 mg Oral Daily   Chlorhexidine  Gluconate Cloth  6 each Topical Daily   feeding supplement  237 mL Oral BID BM   gabapentin   100 mg Oral Q8H   HYDROmorphone    Intravenous Q4H   latanoprost   1 drop Both Eyes QHS   lidocaine   1 patch Transdermal Q24H   And   lidocaine   1 patch Transdermal Q24H   polyethylene glycol  17 g Oral BID   senna-docusate  1 tablet Oral BID   sodium chloride  flush  10 mL Intrapleural Q8H   sodium chloride  flush  3 mL Intravenous Q12H   tamsulosin   0.4 mg Oral QHS   Continuous Infusions:  sodium chloride  75 mL/hr at 09/07/23 0238    ceFAZolin  (ANCEF ) IV 2 g (09/07/23 0515)   heparin  Stopped (09/07/23 0740)   PRN Meds: bisacodyl , diazepam , diphenhydrAMINE  **OR** diphenhydrAMINE , diphenhydrAMINE -zinc  acetate, naloxone  **AND** sodium chloride  flush, prochlorperazine    Vital Signs    Vitals:   09/07/23 0142 09/07/23 0300 09/07/23 0402 09/07/23 0804  BP: (!) 109/54 111/62  (!) 117/58  Pulse: 78 84  83  Resp: 18 20 18 11   Temp:  97.9 F (36.6 C)  98.4 F (36.9 C)  TempSrc:  Oral  Oral  SpO2: 94% 96% 98% 98%  Weight:      Height:        Intake/Output Summary (Last 24 hours) at 09/07/2023 0916 Last data filed at 09/07/2023 0841 Gross per 24 hour  Intake 2003.69 ml  Output 3440 ml  Net -1436.31 ml      09/01/2023   11:18 AM 08/31/2023    6:30 PM 08/28/2023    1:14 PM  Last 3 Weights  Weight (lbs) 210 lb 5.1 oz 202 lb 214 lb  Weight (kg) 95.4 kg 91.627 kg 97.07 kg      Telemetry    Atrial fibrillation with RVR as high as the 160's- Personally Reviewed  ECG    No new EKG to review- Personally  Reviewed  Physical Exam   Alert Chest tube right lung No murmur prior sternotomy Prior right CEA Abdomen benign Trace edema Labs    High Sensitivity Troponin:   Recent Labs  Lab 08/31/23 1856 08/31/23 2012  TROPONINIHS 5 4      Chemistry Recent Labs  Lab 08/31/23 1856 09/01/23 0450 09/02/23 0424 09/03/23 0118 09/04/23 0221 09/05/23 0222 09/06/23 0307 09/07/23 0048  NA 137   < >  --    < > 130* 133* 137 135  K 3.9   < >  --    < > 4.1 4.1 4.0 4.3  CL 102   < >  --    < > 96* 99 105 103  CO2 24   < >  --    < > 23 23 26 24   GLUCOSE 108*   < >  --    < > 126* 155* 112* 141*  BUN 23   < >  --    < > 19 34* 18 19  CREATININE 1.15   < >  --    < >  1.29* 2.36* 1.32* 1.21  CALCIUM  8.5*   < >  --    < > 8.4* 8.1* 7.9* 8.0*  PROT 6.9  --  5.8*  --  6.3*  --   --   --   ALBUMIN 2.7*  --  2.2*  --  2.1*  --   --   --   AST 23  --   --   --  24  --   --   --   ALT 23  --   --   --  37  --   --   --   ALKPHOS 71  --   --   --  79  --   --   --   BILITOT 0.5  --   --   --  0.5  --   --   --   GFRNONAA >60   < >  --    < > 59* 29* 58* >60  ANIONGAP 11   < >  --    < > 11 11 6 8    < > = values in this interval not displayed.     Hematology Recent Labs  Lab 09/05/23 0222 09/06/23 0307 09/07/23 0048  WBC 10.2 9.9 12.9*  RBC 3.49* 3.18* 2.66*  HGB 10.2* 9.4* 7.8*  HCT 31.4* 28.7* 24.1*  MCV 90.0 90.3 90.6  MCH 29.2 29.6 29.3  MCHC 32.5 32.8 32.4  RDW 13.6 13.4 13.7  PLT 249 259 315    BNP Recent Labs  Lab 08/31/23 2012  BNP 154.1*     DDimer  Recent Labs  Lab 08/31/23 1856  DDIMER 1.50*     CHA2DS2-VASc Score = 5   This indicates a 7.2% annual risk of stroke. The patient's score is based upon: CHF History: 0 HTN History: 1 Diabetes History: 0 Stroke History: 2 Vascular Disease History: 1 Age Score: 1 Gender Score: 0     Radiology    DG CHEST PORT 1 VIEW Result Date: 09/06/2023 CLINICAL DATA:  Pleural effusion EXAM: PORTABLE CHEST 1 VIEW  COMPARISON:  09/06/2023 FINDINGS: Right basilar pigtail pleural catheter in unchanged position with similar acute angle projecting over the right hemidiaphragm. Right pleural effusion and associated airspace opacities in the right lung. Low lung volumes accentuate pulmonary vascularity and cardiomediastinal silhouette. No pneumothorax. Sternotomy and CABG. Gas-filled loops of colon in the upper abdomen. IMPRESSION: 1. Right basilar pigtail pleural catheter in unchanged position with acute angle projecting over the diaphragm. 2. Right pleural effusion and associated airspace opacities in the right lung are unchanged. Electronically Signed   By: Norman Gatlin M.D.   On: 09/06/2023 22:57   DG CHEST PORT 1 VIEW Result Date: 09/06/2023 CLINICAL DATA:  Empyema. EXAM: PORTABLE CHEST 1 VIEW COMPARISON:  Radiograph yesterday FINDINGS: Stable positioning of right basilar pigtail catheter. The tubing is noted to be looped or a kinked projecting over the mid aspect of the hemithorax. Grossly unchanged right basilar opacity and pleural thickening/effusion. No evidence of pneumothorax. Overall low lung volumes are unchanged. Prior median sternotomy with stable heart size. IMPRESSION: 1. Stable positioning of right basilar pigtail catheter. The tubing is noted to be looped or kinked projecting over the mid aspect of the hemithorax. 2. Grossly unchanged right basilar opacity and pleural thickening/effusion. Electronically Signed   By: Andrea Gasman M.D.   On: 09/06/2023 15:20   DG CHEST PORT 1 VIEW Result Date: 09/05/2023 CLINICAL DATA:  [id number] Chest tube in place [id number] EXAM:  PORTABLE CHEST 1 VIEW COMPARISON:  Chest x-ray 09/03/2023 FINDINGS: Patient is rotated limiting evaluation. Redemonstration of a right chest tube with pigtail overlying the right hemidiaphragm with persistent kinking. The heart and mediastinal contours are within normal limits. Right mid to lower lung zone patchy airspace opacities. No pulmonary  edema. Persistent at least trace to small volume right pleural effusion. No pneumothorax. No acute osseous abnormality. IMPRESSION: 1. Persistent at least trace to small volume right pleural effusion. 2. Right mid to lower lung zone patchy airspace opacities. 3. Redemonstration of a right chest tube with pigtail overlying the right hemidiaphragm with persistent kinking. Electronically Signed   By: Morgane  Naveau M.D.   On: 09/05/2023 13:59    Patient Profile     73 y.o. male  with a hx of hypertension, hyperlipidemia, carotid artery stenosis s/p endarterectomy in 2018, prior TIA, prior stroke, Crohn's disease, ulcerative colitis, and multivessel CAD s/p CABG 2005 and PCI to SVG to RI in 2008 who is being seen 09/01/2023 for the evaluation of new onset atrial fibrillation at the request of CHRISTOBAL Guadalajara MD.   Assessment & Plan    #New onset atrial fibrillation with RVR - suspect AF was triggered by the intrathoracic infection - NSR on oral amiodarone  now  - 2D echo -- mild MR, normal biatrial size.   - TSH normal 1.5 - CHADS2VASC score  5 (hypertension, age> 65, history of CVA, CAD) -no long term oral anticoagulation per Dr Nancey EP due to frequent falls - Currently on IV heparin  drip d/c on d/c given recent conversion from afib - amiodarone  -- 200mg  BID    # Back Pain - Neurosurgery consult appreciated for acute on chronic low back pain with MRI showing mild generalized spondylolysis and DJD.  Plan is for pain management follow-up outpatient    #ASCAD #Hyperlipidemia -s/p remote history of CABG 2005 -s/p  PCI of SVG to RI in 2008  -Denies any recent anginal symptoms prior to having all the issues with back pain but then started having sharp chest pain in the right chest that seem to radiate from the back into the right chest -High-sensitivity troponin is normal (5>>4) -2D echo ok -Continue aspirin  81 mg daily, atorvastatin  80 mg daily    For questions or updates, please contact [facility] Please consult [email or web address] for contact info under        Signed, Maude Emmer, MD  09/07/2023, 9:16 AM

## 2023-09-07 NOTE — Progress Notes (Signed)
 PROGRESS NOTE    Jacob [name]  FMW:[id number] DOB: [birth year] DOA: 08/31/2023 PCP: [name], MD   Brief Narrative: Jacob [name] is a 73 y.o. male with a history of CAD s/p CABG, hyperlipidemia, hypertension, Crohn's disease, chronic low back pain.  Patient presented secondary to worsening back pain and right-sided chest pain. During hospitalization, patient developed atrial fibrillation with RVR, which was complicated by hypotension on diltiazem  drip. Patient's pain has been difficult to control. Worsening respiratory status. Pleural fluid concerning for empyema. Right chest tube placed and culture data significant for MSSA. Patient developed hemothorax while chest tube was in place, complicated by heparin .   Assessment and Plan:  Acute on chronic low back pain History of multiple falls. MRI obtained and was significant for multilevel degenerative disc disease with foraminal/extraforaminal disc protrusions with resultant possible nerve root impingement; also with multilevel spinal stenosis and transitional lumbosacral anatomy. Neurosurgery was consulted and recommended no inpatient management and for outpatient pain management referral. Patient states they will follow-up with Dr. Louis, neurosurgery, as an outpatient. -Continue PT/OT -LSO brace while ambulating  Right sided pleuritic chest pain Point tenderness around the sixth rib. No associated known falls. CT imaging without evidence of fracture. Pleural effusion noted on that side, however, so injury could still be a possibility. Pain does not wrap around chest, so unlikely nerve related. Patient does have cholelithiasis, but I don't believe referred pain would manifest at his current area of pain. PCA pump started by PCCM and patient has been tolerating pain better. -Continue PCA pump  Right moderate pleural effusion Right-sided empyema secondary to MSSA Unclear etiology. Patient with a history of multiple falls so  possibly inflammatory and related to injury. No symptoms concerning for infection, however gram stain significant for GPCs in clusters. Fluid analysis significant for orange/cloudy fluid and significant WBC concerning for possibly chylous fluid. Triglycerides added. Fluid is exudative based on Light's Criteria. Pulmonology on board. CT chest abdomen/pelvis significant for no dissection and no PE but did show reaccumulation of right pleural fluid. PCCM admitted patient to the ICU and placed a right chest tube; Zosyn  added.  Fluid culture significant for MSSA bacteremia. Cytology with no malignant cells identified. Now with hemothorax. -ID recommendations: Continue cefazolin  IV -Follow-up pleural fluid triglycerides -Right-sided chest tube per PCCM  Hemothorax Newly developed 8/18. Bloody fluid noted to be coming from right chest tube. Complicated by heparin  use. Associated hemoglobin drop from 9.4 to 7.8. Wall suction stopped by pulmonology. -Follow-up further pulmonology recommendations  Atrial fibrillation with RVR New diagnosis. Cardiology consulted. Patient started on diltiazem  IV but developed subsequent hypotension. Patient with persistent RVR at this time. Heparin  IV started. Patient converted to sinus rhythm on 8/15. -Cardiology recommendations: amiodarone  PO, heparin  IV -Hold heparin  secondary to hemothorax, with persistent bloody fluid  Wheezing Patient with development of significant atelectasis likely secondary to splinting. -Albuterol as needed  AKI Likely ATN related to hypotension from diltiazem  IV drip. Worsened renal function. Renal ultrasound reassuring. IV fluid bolus given and continuous fluids continued. Creatinine improving. -IV fluids -BMP in AM  Carotid artery stenosis Noted. Patient is s/p history of endarterectomy. -Continue aspirin  -Continue Lipitor  Constipation Secondary to narcotic use. Patient reports multiple bowel movements. Abdomen remains distended with  bowel sounds. -MiraLAX  -Colate  History of crohn's disease Noted.  Asymptomatic cholelithiasis Noted on ultrasound.   CAD No evidence of angina. Patient was on metoprolol  which is held. -Continue aspirin  and Lipitor  Primary hypertension Patient was managed on  lisinopril  and amlodipine  which were discontinued after starting Cardizem .  Normocytic anemia Baseline hemoglobin appears to be around 11-12. Drifted down, but now with sharp drop to 7.8 secondary to hemothorax. Repeat CBC pending this morning. -Follow-up CBC and transfuse as needed for hemoglobin <7; patient agreeable to blood transfusion as needed -Type and screen   DVT prophylaxis: Heparin  IV Code Status:   Code Status: Full Code Family Communication: None at bedside Disposition Plan: Discharge pending continued medical workup/management; anticipate discharge in several days   Consultants:  Cardiology Neurosurgery PCCM Infectious disease  Procedures:  Transthoracic Echocardiogram Chest tube placement  Antimicrobials: Vancomycin  IV Zosyn  IV Cefazolin  IV   Subjective: Pain is improving. No specific concerns at this time.  Objective: BP 111/62 (BP Location: Left Arm)   Pulse 84   Temp 97.9 F (36.6 C) (Oral)   Resp 18   Ht 5' 11 (1.803 m)   Wt 95.4 kg   SpO2 98%   BMI 29.33 kg/m   Examination:  General exam: Appears calm and comfortable Respiratory system: Rhonchi in right upper lobe with significantly diminished breath sound at RLL. Respiratory effort normal. Cardiovascular system: S1 & S2 heard, RRR. No murmurs. Gastrointestinal system: Abdomen is nondistended, soft and nontender. Normal bowel sounds heard. Central nervous system: Alert and oriented. No focal neurological deficits. Musculoskeletal: No edema. No calf tenderness Psychiatry: Judgement and insight appear normal. Mood & affect appropriate.    Data Reviewed: I have personally reviewed following labs and imaging  studies  CBC Lab Results  Component Value Date   WBC 12.9 (H) 09/07/2023   RBC 2.66 (L) 09/07/2023   HGB 7.8 (L) 09/07/2023   HCT 24.1 (L) 09/07/2023   MCV 90.6 09/07/2023   MCH 29.3 09/07/2023   PLT 315 09/07/2023   MCHC 32.4 09/07/2023   RDW 13.7 09/07/2023   LYMPHSABS 1.3 08/31/2023   MONOABS 0.9 08/31/2023   EOSABS 0.2 08/31/2023   BASOSABS 0.0 08/31/2023     Last metabolic panel Lab Results  Component Value Date   NA 135 09/07/2023   K 4.3 09/07/2023   CL 103 09/07/2023   CO2 24 09/07/2023   BUN 19 09/07/2023   CREATININE 1.21 09/07/2023   GLUCOSE 141 (H) 09/07/2023   GFRNONAA >60 09/07/2023   GFRAA 66 06/07/2019   CALCIUM  8.0 (L) 09/07/2023   PROT 6.3 (L) 09/04/2023   ALBUMIN 2.1 (L) 09/04/2023   LABGLOB 2.8 06/07/2019   AGRATIO 1.6 06/07/2019   BILITOT 0.5 09/04/2023   ALKPHOS 79 09/04/2023   AST 24 09/04/2023   ALT 37 09/04/2023   ANIONGAP 8 09/07/2023    GFR: Estimated Creatinine Clearance: 66 mL/min (by C-G formula based on SCr of 1.21 mg/dL).  Recent Results (from the past 240 hours)  Culture, body fluid w Gram Stain-bottle     Status: Abnormal   Collection Time: 09/02/23 12:36 PM   Specimen: Pleura  Result Value Ref Range Status   Specimen Description PLEURAL RIGHT  Final   Special Requests NONE  Final   Gram Stain   Final    GRAM POSITIVE COCCI IN CLUSTERS IN BOTH AEROBIC AND ANAEROBIC BOTTLES CRITICAL RESULT CALLED TO, READ BACK BY AND VERIFIED WITH: RN LONELL MATSU [phone] FCP Performed at [facility], [address], [place], [place] [address]    Culture STAPHYLOCOCCUS AUREUS (A)  Final   Report Status 09/05/2023 FINAL  Final   Organism ID, Bacteria STAPHYLOCOCCUS AUREUS  Final      Susceptibility  Staphylococcus aureus - MIC*    CIPROFLOXACIN <=0.5 SENSITIVE Sensitive     ERYTHROMYCIN <=0.25 SENSITIVE Sensitive     GENTAMICIN <=0.5 SENSITIVE Sensitive     OXACILLIN <=0.25 SENSITIVE Sensitive     TETRACYCLINE <=1 SENSITIVE  Sensitive     VANCOMYCIN  1 SENSITIVE Sensitive     TRIMETH/SULFA <=10 SENSITIVE Sensitive     CLINDAMYCIN <=0.25 SENSITIVE Sensitive     RIFAMPIN <=0.5 SENSITIVE Sensitive     Inducible Clindamycin NEGATIVE Sensitive     LINEZOLID 2 SENSITIVE Sensitive     * STAPHYLOCOCCUS AUREUS  Gram stain     Status: None   Collection Time: 09/02/23 12:36 PM   Specimen: Pleura  Result Value Ref Range Status   Specimen Description PLEURAL RIGHT  Final   Special Requests NONE  Final   Gram Stain   Final    RARE WBC PRESENT, PREDOMINANTLY PMN NO ORGANISMS SEEN Performed at [facility], [address], [place], [place] [address]    Report Status 09/02/2023 FINAL  Final  Culture, blood (Routine X 2) w Reflex to ID Panel     Status: None (Preliminary result)   Collection Time: 09/03/23 10:22 AM   Specimen: BLOOD RIGHT HAND  Result Value Ref Range Status   Specimen Description BLOOD RIGHT HAND  Final   Special Requests   Final    BOTTLES DRAWN AEROBIC AND ANAEROBIC Blood Culture results may not be optimal due to an inadequate volume of blood received in culture bottles   Culture   Final    NO GROWTH 3 DAYS Performed at [facility], [address], [place], [place] [address]    Report Status PENDING  Incomplete  Culture, blood (Routine X 2) w Reflex to ID Panel     Status: None (Preliminary result)   Collection Time: 09/03/23 10:36 AM   Specimen: BLOOD LEFT HAND  Result Value Ref Range Status   Specimen Description BLOOD LEFT HAND  Final   Special Requests   Final    BOTTLES DRAWN AEROBIC ONLY Blood Culture adequate volume   Culture   Final    NO GROWTH 3 DAYS Performed at [facility], [address] [place], [place] [address]    Report Status PENDING  Incomplete  MRSA Next Gen by PCR, Nasal     Status: None   Collection Time: 09/03/23  2:22 PM   Specimen: Nasal Mucosa; Nasal Swab  Result Value Ref Range Status   MRSA by PCR Next Gen NOT DETECTED NOT DETECTED Final     Comment: (NOTE) The GeneXpert MRSA Assay (FDA approved for NASAL specimens only), is one component of a comprehensive MRSA colonization surveillance program. It is not intended to diagnose MRSA infection nor to guide or monitor treatment for MRSA infections. Test performance is not FDA approved in patients less than 49 years old. Performed at [facility], [address] [place], [place] [address]       Radiology Studies: DG CHEST PORT 1 VIEW Result Date: 09/06/2023 CLINICAL DATA:  Pleural effusion EXAM: PORTABLE CHEST 1 VIEW COMPARISON:  09/06/2023 FINDINGS: Right basilar pigtail pleural catheter in unchanged position with similar acute angle projecting over the right hemidiaphragm. Right pleural effusion and associated airspace opacities in the right lung. Low lung volumes accentuate pulmonary vascularity and cardiomediastinal silhouette. No pneumothorax. Sternotomy and CABG. Gas-filled loops of colon in the upper abdomen. IMPRESSION: 1. Right basilar pigtail pleural catheter in unchanged position with acute  angle projecting over the diaphragm. 2. Right pleural effusion and associated airspace opacities in the right lung are unchanged. Electronically Signed   By: Norman Gatlin M.D.   On: 09/06/2023 22:57   DG CHEST PORT 1 VIEW Result Date: 09/06/2023 CLINICAL DATA:  Empyema. EXAM: PORTABLE CHEST 1 VIEW COMPARISON:  Radiograph yesterday FINDINGS: Stable positioning of right basilar pigtail catheter. The tubing is noted to be looped or a kinked projecting over the mid aspect of the hemithorax. Grossly unchanged right basilar opacity and pleural thickening/effusion. No evidence of pneumothorax. Overall low lung volumes are unchanged. Prior median sternotomy with stable heart size. IMPRESSION: 1. Stable positioning of right basilar pigtail catheter. The tubing is noted to be looped or kinked projecting over the mid aspect of the hemithorax. 2. Grossly unchanged right basilar opacity and  pleural thickening/effusion. Electronically Signed   By: Andrea Gasman M.D.   On: 09/06/2023 15:20   DG CHEST PORT 1 VIEW Result Date: 09/05/2023 CLINICAL DATA:  [id number] Chest tube in place [id number] EXAM: PORTABLE CHEST 1 VIEW COMPARISON:  Chest x-ray 09/03/2023 FINDINGS: Patient is rotated limiting evaluation. Redemonstration of a right chest tube with pigtail overlying the right hemidiaphragm with persistent kinking. The heart and mediastinal contours are within normal limits. Right mid to lower lung zone patchy airspace opacities. No pulmonary edema. Persistent at least trace to small volume right pleural effusion. No pneumothorax. No acute osseous abnormality. IMPRESSION: 1. Persistent at least trace to small volume right pleural effusion. 2. Right mid to lower lung zone patchy airspace opacities. 3. Redemonstration of a right chest tube with pigtail overlying the right hemidiaphragm with persistent kinking. Electronically Signed   By: Morgane  Naveau M.D.   On: 09/05/2023 13:59   US  RENAL Result Date: 09/05/2023 CLINICAL DATA:  Acute renal insufficiency, left renal cysts EXAM: RENAL / URINARY TRACT ULTRASOUND COMPLETE COMPARISON:  09/03/2023 FINDINGS: Right Kidney: Renal measurements: 10.4 x 5.2 x 5.2 cm = volume: 145.6 mL. Echogenicity within normal limits. No mass or hydronephrosis visualized. Left Kidney: Renal measurements: 11.5 x 4.7 x 6.2 cm = volume: 172.4 mL. Echogenicity within normal limits. No mass or hydronephrosis visualized. The simple left renal cysts seen on prior CT are not well visualized on this exam, but do not require specific imaging follow-up. Bladder: Appears normal for degree of bladder distention. Other: None. IMPRESSION: 1. Unremarkable renal ultrasound. Electronically Signed   By: Ozell Daring M.D.   On: 09/05/2023 11:26      LOS: 7 days    Elgin Lam, MD [facility] 09/07/2023, 8:10 AM   If 7PM-7AM, please contact night-coverage [email or web address]

## 2023-09-07 NOTE — Progress Notes (Addendum)
 Contacted by nurse due to bloody output from chest tube. Drained 1200 of red colored fluid with a slight drop in Hb from 9.4 to 7.8. Overnight chest tube to waterseal. Patient hemodynamically stable, not in distress, no tachycardia or hypotension.   - Rechecking Hb now, active type and screen  - Plan for CT angio of the chest to evaluate for possible bleeding. Placed back in suction for now.  - Will evaluate pocket and decide on need for thoracic surgery consult. Will hold off on any more doses of TPA/DNASE - IV heparin  for afib stopped   Zola Herter, MD [facility] Office: [phone]   See Amion for personal pager PCCM on call pager [phone] until 7pm. Please call Elink 7p-7a. [phone]

## 2023-09-07 NOTE — Progress Notes (Signed)
 NAME:  Jacob [name], MRN:  [id number], DOB:  Sep 06, 1950, LOS: 7 ADMISSION DATE:  08/31/2023, CONSULTATION DATE: 09/07/23  REFERRING MD:  Shlomo, CHIEF COMPLAINT:  back pain   History of Present Illness:  73 year old man w/ CAD, previously functional presenting with 1 month of worsening back pain and multiple falls.  Workup to date neg.  This time coming in with associated afib/RVR so admitted.  Imaging revealed R pleural effusion and this was drained yesterday.  This am began having R pleurisy associated with ongoing back pain and increasing dyspnea.  PCCM consulted for admission due to ongoing worsening dyspnea.   Pertinent  Medical History   Past Medical History:  Diagnosis Date   CAD (coronary artery disease), native coronary artery     multivessel ASCAD s/p CABG and s/p PCI of SVG to RI 2008   Carotid artery stenosis    patent right CEA site and 1-39% Lcarotid stenosis   Crohn's disease (HCC)    flare 2000   Depression    Dyslipidemia    Dyspnea    ED (erectile dysfunction)    History of blood transfusion 2005   Hypertension    Myocardial infarction [facility]) 2005   5 bypasses   Nephrolithiasis    Stroke (HCC)    TIA- Endartarectemy R side   TIA (transient ischemic attack)    Ulcerative colitis (HCC)    Left sided     Significant Hospital Events: Including procedures, antibiotic start and stop dates in addition to other pertinent events   8/14 admit 8/15 small bore chest tube placed, first dose of pleural lytics instilled 8/16 second dose of pleural lytics instilled 8/17 Am CXR pending, it appears Dwane was changed overnight  8/19 More bloody output. Checking Hb after transfusion of 2units. CTA no extravasation   Interim History / Subjective:  Seen sitting up in bed with no acute complaints  Family at bedside   Objective    Blood pressure 127/62, pulse 76, temperature 97.8 F (36.6 C), temperature source Oral, resp. rate 20, height 5' 11 (1.803 m), weight  95.4 kg, SpO2 98%.        Intake/Output Summary (Last 24 hours) at 09/07/2023 1253 Last data filed at 09/07/2023 1125 Gross per 24 hour  Intake 1731.64 ml  Output 3700 ml  Net -1968.36 ml   Filed Weights   08/31/23 1830 09/01/23 1118  Weight: 91.6 kg 95.4 kg    Examination: General: Acute on chronically ill appearing elderly male sitting up in bed, in NAD HEENT: [place]/ATT, MM pink/moist, PERRL,  Neuro: Alert and oriented x3, generally weak  CV: s1s2 regular rate and rhythm, no murmur, rubs, or gallops,  PULM:  Diminished bilaterally, no increased work of breathing, no added breath sounds  GI: soft, bowel sounds active in all 4 quadrants, non-tender, non-distended, tolerating oral deit Extremities: warm/dry, no edema  Skin: no rashes or lesions  Resolved problem list   Assessment and Plan  Acute hypoxic respiratory failure likely due to pulmonary splinting and pleural effusion Right empyema with pan sensitive Staphylococcus Aureus seen on cultures Right sided pleuritic chest pain Concern for bleeding and hemothorax. Dropped his Hb overnight and had more bloody output. No extravasation on CT. CT shows improved pocket with good drainage with lytics.  P:  - Please continue to hold heparin  - Will not give more doses of lytics  - CTA chest performed today, no active extravasation, appreciate thoracic surgery following. Agree with observation for now - Will  place chest tube back to suction  - Trend Hb every 8 hours post transfusion  - Continue antibiotics, he will need at 3-4 weeks to cover his empyema. Can transition to oral antibiotic on discharge Routine chest tube care Scheduled flushing of chest tube Monitor chest tube output Daily chest x-ray while chest tube in place Multimodal pain control  Remaining acute on chronic medical conditions managed per primary   I spent 63 minutes caring for this patient today, including preparing to see the patient, obtaining a medical  history , reviewing a separately obtained history, performing a medically appropriate examination and/or evaluation, counseling and educating the patient/family/caregiver, ordering medications, tests, or procedures, referring and communicating with other health care professionals (not separately reported), documenting clinical information in the electronic health record, independently interpreting results (not separately reported/billed) and communicating results to the patient/family/caregiver, and care coordination (not separately reported/billed)    Zola Herter, MD [facility] Office: [phone]

## 2023-09-07 NOTE — Consult Note (Cosign Needed)
 [address]       [place] [address]             [phone]        GUTHRIE LEMME [facility] Medical Record #[id number] Date of Birth: 1950-02-10  Referring: Dr. Zaida Mason ,MD Primary Care: Hamrick, Charlene CROME, MD Primary Cardiologist:Traci Shlomo, MD  Chief Complaint:    Chief Complaint  Patient presents with   Abdominal Pain   Back Pain   Dizziness   History of Present Illness:     This is a 73 year old male with a past medical history significant for hypertension, hyperlipidemia, coronary artery disease (CABG x 5 05'), TIA/CVA (s/p endarterectomy 18'), Crohn's disease and chronic low back pain who presented to the ED with complaints of new right flank/right upper quadrant pain and dizziness on 08/31/2023. He also had complaints of cough, several falls, and shortness of breath on exertion. Initial chest x ray showed right basilar atelectasis and small effusion. CTA showed no PE and right pleural effusion with associated right basilar atelectasis. Initial WBC was 9100. IR performed a right thoracentesis with 350 cc removed on 09/02/2023. He was initially placed on empiric Vancomycin  and then Zosyn  was added. Pulmonary/CCM placed a right pleural chest tube on 08/15.  He was put on a PCA pump to help with right sided pleuritic pain. He was given pleural fibrinolysis on 08/15. He was given a second dose of pleural lytics (1/2 dose as had bloody drainage from right chest tube) on 08/16. Infectious disease was consulted as culture showed MSSA. Zosyn  and Vancomycin  were stopped and he was started on Cefazolin . H and H dropped from 9.4 and 28.7 (08/18) to 6.6 and 20.3 this am. Heparin  drip has been stopped. CTA done earlier today showed no pneumonia, interval decrease in size of the multiloculated right pleural effusion with adjacent compressive atelectasis throughout the right Lung. There was no extravasation of contrast to suggest active bleeding. Thoracic  consultation was requested.  In addition, he was found to be in new onset atrial fibrillation with RVR. He was started on Toprol  XL and put on Cardizem  drip (after bolus), and Heparin  drip. Cardizem  and Toprol  XL were later stopped secondary to hypotension. Echo showed LVEF 55-60%, trivial AI, and mild MR and TR.  Current Activity/ Functional Status: Patient is independent with mobility/ambulation, transfers, ADL's, IADL's.   Zubrod Score: At the time of surgery this patient's most appropriate activity status/level should be described as: []     0    Normal activity, no symptoms [x]     1    Restricted in physical strenuous activity but ambulatory, able to do out light work []     2    Ambulatory and capable of self care, unable to do work activities, up and about more than 50%  Of the time                            []     3    Only limited self care, in bed greater than 50% of waking hours []     4    Completely disabled, no self care, confined to bed or chair []     5    Moribund  Past Medical History:  Diagnosis Date   CAD (coronary artery disease), native coronary artery     multivessel ASCAD s/p CABG and s/p PCI of SVG to RI 2008  Carotid artery stenosis    patent right CEA site and 1-39% Lcarotid stenosis   Crohn's disease (HCC)    flare 2000   Depression    Dyslipidemia    Dyspnea    ED (erectile dysfunction)    History of blood transfusion 2005   Hypertension    Myocardial infarction [facility]) 2005   5 bypasses   Nephrolithiasis    Stroke [facility])    TIA- Endartarectemy R side   TIA (transient ischemic attack)    Ulcerative colitis (HCC)    Left sided    Past Surgical History:  Procedure Laterality Date   CARDIAC CATHETERIZATION  06/08   Patnet grafts LAD, marginal, distal right   carpel tunnel surgery Left    COLONOSCOPY     COLONOSCOPY WITH PROPOFOL  N/A 10/25/2012   Procedure: COLONOSCOPY WITH PROPOFOL ;  Surgeon: Gladis MARLA Louder, MD;  Location: [facility];  Service:  Endoscopy;  Laterality: N/A;   CORONARY ARTERY BYPASS GRAFT  2005   ASCVD,multivessel,S/P CABG  x5 total per patient   ENDARTERECTOMY Right 06/24/2016   Procedure: ENDARTERECTOMY CAROTID;  Surgeon: Marea Selinda RAMAN, MD;  Location: [facility] ORS;  Service: Vascular;  Laterality: Right;   HERNIA REPAIR  many years ago   IR THORACENTESIS ASP PLEURAL SPACE W/IMG GUIDE  09/02/2023   KNEE ARTHROSCOPY Right 1995    Social History   Tobacco Use  Smoking Status Never  Smokeless Tobacco Current   Types: Snuff    Social History   Substance and Sexual Activity  Alcohol Use No    Allergies: No Known Allergies  Current Facility-Administered Medications  Medication Dose Route Frequency Provider Last Rate Last Admin   0.9 %  sodium chloride  infusion (Manually program via Guardrails IV Fluids)   Intravenous Once Briana Elgin LABOR, MD       0.9 %  sodium chloride  infusion   Intravenous Continuous Briana Elgin LABOR, MD 75 mL/hr at 09/07/23 0238 New Bag at 09/07/23 0238   acetaminophen  (TYLENOL ) tablet 1,000 mg  1,000 mg Oral TID Christobal Guadalajara, MD   1,000 mg at 09/07/23 0941   amiodarone  (PACERONE ) tablet 200 mg  200 mg Oral BID Mealor, Augustus E, MD   200 mg at 09/07/23 9057   aspirin  EC tablet 81 mg  81 mg Oral Daily Opyd, Timothy S, MD   81 mg at 09/07/23 9057   atorvastatin  (LIPITOR) tablet 80 mg  80 mg Oral Daily Opyd, Timothy S, MD   80 mg at 09/07/23 9057   bisacodyl  (DULCOLAX) EC tablet 5 mg  5 mg Oral Daily PRN Opyd, Timothy S, MD       ceFAZolin  (ANCEF ) IVPB 2g/100 mL premix  2 g Intravenous Q8H Sherryll Suzen SQUIBB, RPH 200 mL/hr at 09/07/23 0515 2 g at 09/07/23 0515   Chlorhexidine  Gluconate Cloth 2 % PADS 6 each  6 each Topical Daily Claudene Toribio BROCKS, MD   6 each at 09/07/23 0944   diazepam  (VALIUM ) injection 2.5 mg  2.5 mg Intravenous Q4H PRN Arloa Folks D, NP   2.5 mg at 09/05/23 2259   diphenhydrAMINE  (BENADRYL ) injection 12.5 mg  12.5 mg Intravenous Q6H PRN Claudene Toribio BROCKS, MD       Or    diphenhydrAMINE  (BENADRYL ) 12.5 MG/5ML elixir 12.5 mg  12.5 mg Oral Q6H PRN Claudene Toribio BROCKS, MD       diphenhydrAMINE -zinc  acetate (BENADRYL ) 2-0.1 % cream   Topical BID PRN Briana Elgin LABOR, MD       feeding  supplement (ENSURE PLUS HIGH PROTEIN) liquid 237 mL  237 mL Oral BID BM Briana Elgin LABOR, MD   237 mL at 09/05/23 1000   gabapentin  (NEURONTIN ) capsule 100 mg  100 mg Oral Q8H Laron Agent, RPH   100 mg at 09/07/23 0513   heparin  ADULT infusion 100 units/mL (25000 units/250mL)  1,300 Units/hr Intravenous Continuous Sherryll Suzen SQUIBB, RPH   Stopped at 09/07/23 0740   HYDROmorphone  (DILAUDID ) 1 mg/mL PCA injection   Intravenous Q4H Claudene Toribio BROCKS, MD   Received at 09/07/23 1012   latanoprost  (XALATAN ) 0.005 % ophthalmic solution 1 drop  1 drop Both Eyes QHS Opyd, Timothy S, MD   1 drop at 09/06/23 2200   lidocaine  (LIDODERM ) 5 % 1 patch  1 patch Transdermal Q24H Briana Elgin LABOR, MD   1 patch at 09/06/23 1239   And   lidocaine  (LIDODERM ) 5 % 1 patch  1 patch Transdermal Q24H Briana Elgin LABOR, MD   1 patch at 09/06/23 1238   naloxone  (NARCAN ) injection 0.4 mg  0.4 mg Intravenous PRN Claudene Toribio BROCKS, MD       And   sodium chloride  flush (NS) 0.9 % injection 9 mL  9 mL Intravenous PRN Claudene Toribio BROCKS, MD       polyethylene glycol (MIRALAX  / GLYCOLAX ) packet 17 g  17 g Oral BID Briana Elgin LABOR, MD   17 g at 09/06/23 2155   prochlorperazine  (COMPAZINE ) injection 5 mg  5 mg Intravenous Q6H PRN Opyd, Evalene RAMAN, MD       senna-docusate (Senokot-S) tablet 1 tablet  1 tablet Oral BID Briana Elgin LABOR, MD       sodium chloride  flush (NS) 0.9 % injection 10 mL  10 mL Intrapleural Q8H Alghanim, Fahid, MD   10 mL at 09/07/23 0944   sodium chloride  flush (NS) 0.9 % injection 3 mL  3 mL Intravenous Q12H Opyd, Timothy S, MD   3 mL at 09/07/23 0945   tamsulosin  (FLOMAX ) capsule 0.4 mg  0.4 mg Oral QHS Opyd, Timothy S, MD   0.4 mg at 09/06/23 2155    Medications Prior to Admission  Medication Sig Dispense Refill  Last Dose/Taking   amLODipine  (NORVASC ) 5 MG tablet Take 1 tablet (5 mg total) by mouth daily. 90 tablet 0 08/31/2023 Morning   aspirin  EC 81 MG tablet Take 81 mg by mouth daily. Swallow whole.   08/31/2023 Morning   atorvastatin  (LIPITOR) 80 MG tablet Take 1 tablet (80 mg total) by mouth daily. 90 tablet 3 08/31/2023 Morning   glucosamine-chondroitin 500-400 MG tablet Take 1 tablet by mouth 2 (two) times daily.   08/31/2023 Morning   HYDROcodone -acetaminophen  (NORCO/VICODIN) 5-325 MG tablet Take 1 tablet by mouth every 8 (eight) hours as needed for severe pain (pain score 7-10). 20 tablet 0 08/31/2023 Noon   latanoprost  (XALATAN ) 0.005 % ophthalmic solution Place 1 drop into both eyes at bedtime.   08/30/2023 Bedtime   lidocaine  (SALONPAS PAIN RELIEVING) 4 % Place 1 patch onto the skin daily.   08/30/2023 Morning   lisinopril  (ZESTRIL ) 20 MG tablet Take 1 tablet (20 mg total) by mouth daily. 90 tablet 3 08/31/2023 Morning   metoprolol  succinate (TOPROL -XL) 25 MG 24 hr tablet Take 0.5 tablets (12.5 mg total) by mouth daily. 45 tablet 3 08/31/2023 at  6:30 AM   nitroGLYCERIN  (NITROSTAT ) 0.4 MG SL tablet Place 1 tablet (0.4 mg total) under the tongue every 5 (five) minutes as needed for chest pain. 25 tablet  3 Unknown   predniSONE (STERAPRED UNI-PAK 21 TAB) 10 MG (21) TBPK tablet Take 10 mg by mouth See admin instructions.   08/31/2023 Morning   sildenafil (VIAGRA) 100 MG tablet Take 100 mg by mouth daily as needed for erectile dysfunction.   Unknown   tamsulosin  (FLOMAX ) 0.4 MG CAPS capsule Take 0.4 mg by mouth at bedtime.   08/30/2023 Bedtime   tiZANidine  (ZANAFLEX ) 4 MG tablet Take 1 tablet (4 mg total) by mouth 3 (three) times daily. 30 tablet 0 08/31/2023 Morning   vitamin B-12 (CYANOCOBALAMIN ) 500 MCG tablet Take 1,000 mcg by mouth daily.   08/31/2023 Morning    Family History  Problem Relation Age of Onset   Ovarian cancer Mother    CAD Father    CAD Sister    CAD Brother    CAD Brother    CAD  Brother    Colon cancer Neg Hx    Esophageal cancer Neg Hx    Stomach cancer Neg Hx    Rectal cancer Neg Hx    Colon polyps Neg Hx    Review of Systems:    Cardiac Review of Systems: Y or  [ N   ]= no  Chest Pain [   N ] Exertional SOB  [ Y ]     Pedal Edema [ Y  ]       General Review of Systems: [Y] = yes [ N ]=no Constitional: nausea [ N ];  Resp: cough [ Y ];  wheezing[ N ];  hemoptysis[  N];  GI:  vomiting[N  ];              Musculoskeletal: yalgias[  ];  joint swelling[  ];  joint erythema[  ]; joint pain[  ];  back pain[ Y ];  Heme/Lymph:   anemia[ Y ];   Endocrine: Pre diabetes[Y  ];                 Physical Exam: BP (!) 113/57   Pulse 77   Temp 99.8 F (37.7 C) (Oral)   Resp 19   Ht 5' 11 (1.803 m)   Wt 95.4 kg   SpO2 98%   BMI 29.33 kg/m    General appearance: alert, cooperative, and no distress Head: Normocephalic, without obvious abnormality, atraumatic Neck: supple, symmetrical, trachea midline Resp: Diminished right basilar breath sounds, left lung is clear Cardio: RRR GI: soft, non-tender; bowel sounds normal; no masses,  no organomegaly Extremities: SCDs in place Neurologic: Grossly normal  Diagnostic Studies & Laboratory data:     Recent Radiology Findings:   CT ANGIO CHEST AORTA W/CM & OR WO/CM Result Date: 09/07/2023 CLINICAL DATA:  Pleural effusion, concern for hemothorax post TPA/DNASE for empyema EXAM: CT ANGIOGRAPHY CHEST WITH AND WITHOUT CONTRAST TECHNIQUE: Non-contrast CT of the chest was initially obtained. Multidetector CT imaging through the chest was performed using the standard protocol during bolus administration of intravenous contrast. Multiplanar reconstructed images and MIPs were obtained and reviewed to evaluate the vascular anatomy. RADIATION DOSE REDUCTION: This exam was performed according to the departmental dose-optimization program which includes automated exposure control, adjustment of the mA and/or kV according to patient  size and/or use of iterative reconstruction technique. CONTRAST:  75mL OMNIPAQUE  IOHEXOL  350 MG/ML SOLN COMPARISON:  September 03, 2023 FINDINGS: Pulmonary Embolism: No pulmonary embolism. Cardiovascular: No cardiomegaly or pericardial effusion. No aortic aneurysm, intramural hematoma, or aortic dissection. CABG changes. Extensive multi-vessel coronary atherosclerosis. Mediastinum/Nodes: No mediastinal mass. No mediastinal, hilar, or  axillary lymphadenopathy. Lungs/Pleura: The midline trachea and bronchi are patent. Decreased size of the multiloculated right pleural effusion with multifocal compressive atelectasis in the right lung. Percutaneous pigtail thoracostomy tube in place within the largest pocket along the dependent lower pleura. Multifocal subsegmental atelectasis also noted in the lingula and left lower lobe. No pneumothorax. Intermediate density material layering within the inferior pleural effusion (axial 33). Multiple small locules of gas noted in the effusion, likely related to the thoracostomy tube. No extravasation of contrast within the pleural space to suggest active bleeding, at this time. Musculoskeletal: No acute fracture or destructive bone lesion. Osteopenia. Multilevel degenerative disc disease of the spine. Upper Abdomen: No acute abnormality within the partially visualized upper abdomen. Multiple small gallstones again noted. Small volume symmetric bilateral gynecomastia. Review of the MIP images confirms the above findings. IMPRESSION: 1. Interval decrease in size of the multiloculated right pleural effusion with adjacent compressive atelectasis throughout the right lung. Well-positioned pigtail thoracostomy tube. Intermediate density material layering in the inferior aspect of the pleural effusion, which may represent infectious debris or evolving blood products. No extravasation of contrast to suggest active bleeding, at this time. 2. No pneumonia, pulmonary edema, or pneumothorax. 3.  Cholecystolithiasis. Aortic Atherosclerosis (ICD10-I70.0). Electronically Signed   By: Rogelia Myers M.D.   On: 09/07/2023 10:19   DG CHEST PORT 1 VIEW Result Date: 09/06/2023 CLINICAL DATA:  Pleural effusion EXAM: PORTABLE CHEST 1 VIEW COMPARISON:  09/06/2023 FINDINGS: Right basilar pigtail pleural catheter in unchanged position with similar acute angle projecting over the right hemidiaphragm. Right pleural effusion and associated airspace opacities in the right lung. Low lung volumes accentuate pulmonary vascularity and cardiomediastinal silhouette. No pneumothorax. Sternotomy and CABG. Gas-filled loops of colon in the upper abdomen. IMPRESSION: 1. Right basilar pigtail pleural catheter in unchanged position with acute angle projecting over the diaphragm. 2. Right pleural effusion and associated airspace opacities in the right lung are unchanged. Electronically Signed   By: Norman Gatlin M.D.   On: 09/06/2023 22:57   DG CHEST PORT 1 VIEW Result Date: 09/06/2023 CLINICAL DATA:  Empyema. EXAM: PORTABLE CHEST 1 VIEW COMPARISON:  Radiograph yesterday FINDINGS: Stable positioning of right basilar pigtail catheter. The tubing is noted to be looped or a kinked projecting over the mid aspect of the hemithorax. Grossly unchanged right basilar opacity and pleural thickening/effusion. No evidence of pneumothorax. Overall low lung volumes are unchanged. Prior median sternotomy with stable heart size. IMPRESSION: 1. Stable positioning of right basilar pigtail catheter. The tubing is noted to be looped or kinked projecting over the mid aspect of the hemithorax. 2. Grossly unchanged right basilar opacity and pleural thickening/effusion. Electronically Signed   By: Andrea Gasman M.D.   On: 09/06/2023 15:20     I have independently reviewed the above radiologic studies and discussed with the patient   Recent Lab Findings: Lab Results  Component Value Date   WBC 12.7 (H) 09/07/2023   HGB 6.6 (LL) 09/07/2023    HCT 20.3 (L) 09/07/2023   PLT 316 09/07/2023   GLUCOSE 125 (H) 09/07/2023   CHOL 155 01/15/2022   TRIG 95 01/15/2022   HDL 38 (L) 01/15/2022   LDLDIRECT 71 01/24/2013   LDLCALC 98 01/15/2022   ALT 37 09/04/2023   AST 24 09/04/2023   NA 133 (L) 09/07/2023   K 4.1 09/07/2023   CL 102 09/07/2023   CREATININE 1.11 09/07/2023   BUN 18 09/07/2023   CO2 24 09/07/2023   TSH 1.515 09/01/2023   INR  1.1 01/14/2022   HGBA1C 5.8 (H) 01/15/2022   Assessment / Plan:   Right empyema secondary to below-s/p TPA/DNASE 08/15 and 08/18. MSSA bacteremia- on Cefazolin  Anemia- H and H decreased from 9.4 and 28.7 (08/18) to 6.6 and 20.3 this am. Per pulmonary, suspected right hemothorax. CTA showed no extravasation of contrast to suggest active bleeding and        interval decrease in size of the multiloculated right pleural       effusion with adjacent compressive atelectasis throughout the right       lung. Chest tube output to be monitored closely, flushes ordered (d not want clotting to occur in tube), and will be placed back to suction soon. No need for surgery at this time, but will continue to monitor 4.  New onset atrial fibrillation-was on Heparin  drip 08/13 but stopped earlier this am. Converted to sinus rhythm. On Amiodarone  200 mg bid  5. History of chronic back pain (generalized spondylosis worse at L3-4 and facet arthropathy L5-S1)-seen by neurosurgery and recommendation was follow up with pain management for consideration of L3-4 ESI and/or L5-S1 MBB    I  spent 20 minutes counseling the patient face to face.   Alpha Mysliwiec PA-C 09/07/2023 10:49 AM    Agree H/H stable Good drainage on imaging Continue CT management No plans for surgery at this point  Linnie MALVA Rayas

## 2023-09-07 NOTE — Progress Notes (Signed)
 OT Cancellation Note  Patient Details Name: Jacob [name] MRN: [id number] DOB: 1951/01/12   Cancelled Treatment:    Reason Eval/Treat Not Completed:  (patient to have blood transfusion.  OT will hold today and will follow)  Jacob [name] 09/07/2023, 2:25 PM

## 2023-09-07 NOTE — Plan of Care (Signed)
 ?  Problem: Coping: ?Goal: Level of anxiety will decrease ?Outcome: Progressing ?  ?Problem: Safety: ?Goal: Ability to remain free from injury will improve ?Outcome: Progressing ?  ?

## 2023-09-07 NOTE — Plan of Care (Signed)

## 2023-09-08 ENCOUNTER — Inpatient Hospital Stay (HOSPITAL_COMMUNITY)

## 2023-09-08 DIAGNOSIS — I4891 Unspecified atrial fibrillation: Secondary | ICD-10-CM | POA: Diagnosis not present

## 2023-09-08 LAB — CULTURE, BLOOD (ROUTINE X 2)
Culture: NO GROWTH
Culture: NO GROWTH
Special Requests: ADEQUATE

## 2023-09-08 LAB — BASIC METABOLIC PANEL WITH GFR
Anion gap: 11 (ref 5–15)
BUN: 15 mg/dL (ref 8–23)
CO2: 24 mmol/L (ref 22–32)
Calcium: 7.8 mg/dL — ABNORMAL LOW (ref 8.9–10.3)
Chloride: 100 mmol/L (ref 98–111)
Creatinine, Ser: 1.02 mg/dL (ref 0.61–1.24)
GFR, Estimated: >60 mL/min (ref >60)
Glucose, Bld: 117 mg/dL — ABNORMAL HIGH (ref 70–99)
Potassium: 4 mmol/L (ref 3.5–5.1)
Sodium: 135 mmol/L (ref 135–145)

## 2023-09-08 LAB — CBC
HCT: 22 % — ABNORMAL LOW (ref 39.0–52.0)
Hemoglobin: 7.5 g/dL — ABNORMAL LOW (ref 13.0–17.0)
MCH: 30.5 pg (ref 26.0–34.0)
MCHC: 34.1 g/dL (ref 30.0–36.0)
MCV: 89.4 fL (ref 80.0–100.0)
Platelets: 259 K/uL (ref 150–400)
RBC: 2.46 MIL/uL — ABNORMAL LOW (ref 4.22–5.81)
RDW: 13.8 % (ref 11.5–15.5)
WBC: 11 K/uL — ABNORMAL HIGH (ref 4.0–10.5)
nRBC: 0 % (ref 0.0–0.2)

## 2023-09-08 LAB — HEMOGLOBIN AND HEMATOCRIT, BLOOD
HCT: 23.2 % — ABNORMAL LOW (ref 39.0–52.0)
Hemoglobin: 7.7 g/dL — ABNORMAL LOW (ref 13.0–17.0)

## 2023-09-08 LAB — SARS CORONAVIRUS 2 BY RT PCR: SARS Coronavirus 2 by RT PCR: NEGATIVE

## 2023-09-08 MED ORDER — HYDROCORTISONE 1 % EX CREA
TOPICAL_CREAM | Freq: Three times a day (TID) | CUTANEOUS | Status: DC
  Administered 2023-09-08 (×2): 1 via TOPICAL
  Filled 2023-09-08: qty 28

## 2023-09-08 NOTE — Progress Notes (Signed)
 NAME:  Jacob [name], MRN:  [id number], DOB:  11/01/1950, LOS: 8 ADMISSION DATE:  08/31/2023, CONSULTATION DATE: 09/08/23  REFERRING MD:  Jacob [name], CHIEF COMPLAINT:  back pain   History of Present Illness:  73 year old man w/ CAD, previously functional presenting with 1 month of worsening back pain and multiple falls.  Workup to date neg.  This time coming in with associated afib/RVR so admitted.  Imaging revealed R pleural effusion and this was drained yesterday.  This am began having R pleurisy associated with ongoing back pain and increasing dyspnea.  PCCM consulted for admission due to ongoing worsening dyspnea.    Pertinent  Medical History   Past Medical History:  Diagnosis Date   CAD (coronary artery disease), native coronary artery     multivessel ASCAD s/p CABG and s/p PCI of SVG to RI 2008   Carotid artery stenosis    patent right CEA site and 1-39% Lcarotid stenosis   Crohn's disease (HCC)    flare 2000   Depression    Dyslipidemia    Dyspnea    ED (erectile dysfunction)    History of blood transfusion 2005   Hypertension    Myocardial infarction [facility]) 2005   5 bypasses   Nephrolithiasis    Stroke (HCC)    TIA- Endartarectemy R side   TIA (transient ischemic attack)    Ulcerative colitis (HCC)    Left sided     Significant Hospital Events: Including procedures, antibiotic start and stop dates in addition to other pertinent events   8/14 admit 8/15 small bore chest tube placed, first dose of pleural lytics instilled 8/16 second dose of pleural lytics instilled 8/17 Am CXR pending, it appears Jacob [name] was changed overnight  8/19 More bloody output. Checking Hb after transfusion of 2units. CTA no extravasation   Interim History / Subjective:  Patient reports pleuritic chest pain simmilar to before.  Objective    Blood pressure 133/79, pulse 69, temperature 98 F (36.7 C), temperature source Oral, resp. rate 19, height 5' 11 (1.803 m), weight 95.4 kg, SpO2  96%.        Intake/Output Summary (Last 24 hours) at 09/08/2023 1244 Last data filed at 09/08/2023 1149 Gross per 24 hour  Intake 980.87 ml  Output 1950 ml  Net -969.13 ml   Filed Weights   08/31/23 1830 09/01/23 1118  Weight: 91.6 kg 95.4 kg    Examination: General: ill appearing elderly male  HEENT: [place]/ATT, MM pink/moist, PERRL,  Neuro: A&Ox3, grossly intact  CV: RRR, Nl s1,s2  PULM:  Chest tube in place, decreased breath sounds on the right  GI: soft, bowel sounds active in all 4 quadrants, non-tender, non-distended, tolerating oral deit Extremities: warm/dry, no edema  Skin: no rashes or lesions  Resolved problem list   Assessment and Plan  Acute hypoxic respiratory failure likely due to pulmonary splinting and pleural effusion Right empyema with pan sensitive Staphylococcus Aureus seen on cultures Right sided pleuritic chest pain Concern for bleeding and hemothorax. Dropped his Hb overnight and had more bloody output. No extravasation on CT. CT shows improved pocket with good drainage with lytics.  P:  - Continue to hold heparin   - Based on CT chest findings, pocket much improved in size, no active extrav, plan to D/C tube tentatively tomorrow as long as tube output is <150cc  - Keep tube on suction  - CBC checks to assess for any bleeding  - Continue antibiotics, he will need at 3-4 weeks  to cover his empyema. Can transition to oral antibiotic on discharge. Will defer antibiotics to ID Routine chest tube care Scheduled flushing of chest tube Monitor chest tube output Daily chest x-ray while chest tube in place Multimodal pain control  Remaining acute on chronic medical conditions managed per primary   I spent 32 minutes caring for this patient today, including preparing to see the patient, obtaining a medical history , reviewing a separately obtained history, performing a medically appropriate examination and/or evaluation, counseling and educating the  patient/family/caregiver, ordering medications, tests, or procedures, referring and communicating with other health care professionals (not separately reported), documenting clinical information in the electronic health record, independently interpreting results (not separately reported/billed) and communicating results to the patient/family/caregiver, and care coordination (not separately reported/billed)    Zola Herter, MD  Pulmonary & Critical Care Office: [phone]

## 2023-09-08 NOTE — Progress Notes (Signed)
 Progress Note  Patient Name: Jacob [name] Date of Encounter: 09/08/2023  [facility] Cardiologist: Wilbert Bihari, MD    Subjective   No cardiac complaints still in NSR   Inpatient Medications    Scheduled Meds:  sodium chloride    Intravenous Once   acetaminophen   1,000 mg Oral TID   amiodarone   200 mg Oral BID   aspirin  EC  81 mg Oral Daily   atorvastatin   80 mg Oral Daily   feeding supplement  237 mL Oral BID BM   gabapentin   100 mg Oral Q8H   HYDROmorphone    Intravenous Q4H   latanoprost   1 drop Both Eyes QHS   lidocaine   1 patch Transdermal Q24H   And   lidocaine   1 patch Transdermal Q24H   polyethylene glycol  17 g Oral BID   senna-docusate  1 tablet Oral BID   sodium chloride  flush  10 mL Intrapleural Q8H   sodium chloride  flush  3 mL Intravenous Q12H   tamsulosin   0.4 mg Oral QHS   Continuous Infusions:   ceFAZolin  (ANCEF ) IV 2 g (09/08/23 0831)   PRN Meds: bisacodyl , diazepam , diphenhydrAMINE  **OR** diphenhydrAMINE , diphenhydrAMINE -zinc  acetate, naloxone  **AND** sodium chloride  flush, prochlorperazine    Vital Signs    Vitals:   09/08/23 0321 09/08/23 0404 09/08/23 0725 09/08/23 0832  BP: (!) 147/69  127/69   Pulse: 67  75   Resp: 20 20 19  (!) 23  Temp: 98 F (36.7 C)  98 F (36.7 C)   TempSrc: Oral  Oral   SpO2: 94% 96% 96%   Weight:      Height:        Intake/Output Summary (Last 24 hours) at 09/08/2023 0918 Last data filed at 09/08/2023 0834 Gross per 24 hour  Intake 1010.87 ml  Output 1470 ml  Net -459.13 ml      09/01/2023   11:18 AM 08/31/2023    6:30 PM 08/28/2023    1:14 PM  Last 3 Weights  Weight (lbs) 210 lb 5.1 oz 202 lb 214 lb  Weight (kg) 95.4 kg 91.627 kg 97.07 kg      Telemetry    Atrial fibrillation with RVR as high as the 160's- Personally Reviewed  ECG    No new EKG to review- Personally Reviewed  Physical Exam   Alert Chest tube right lung No murmur prior sternotomy Prior right CEA Abdomen  benign Trace edema  Labs    High Sensitivity Troponin:   Recent Labs  Lab 08/31/23 1856 08/31/23 2012  TROPONINIHS 5 4      Chemistry Recent Labs  Lab 09/02/23 0424 09/03/23 0118 09/04/23 0221 09/05/23 0222 09/07/23 0048 09/07/23 0852 09/08/23 0228  NA  --    < > 130*   < > 135 133* 135  K  --    < > 4.1   < > 4.3 4.1 4.0  CL  --    < > 96*   < > 103 102 100  CO2  --    < > 23   < > 24 24 24   GLUCOSE  --    < > 126*   < > 141* 125* 117*  BUN  --    < > 19   < > 19 18 15   CREATININE  --    < > 1.29*   < > 1.21 1.11 1.02  CALCIUM   --    < > 8.4*   < > 8.0* 7.6* 7.8*  PROT 5.8*  --  6.3*  --   --   --   --   ALBUMIN 2.2*  --  2.1*  --   --   --   --   AST  --   --  24  --   --   --   --   ALT  --   --  37  --   --   --   --   ALKPHOS  --   --  79  --   --   --   --   BILITOT  --   --  0.5  --   --   --   --   GFRNONAA  --    < > 59*   < > >60 >60 >60  ANIONGAP  --    < > 11   < > 8 7 11    < > = values in this interval not displayed.     Hematology Recent Labs  Lab 09/07/23 0048 09/07/23 0852 09/07/23 1851 09/08/23 0228  WBC 12.9* 12.7*  --  11.0*  RBC 2.66* 2.22*  --  2.46*  HGB 7.8* 6.6* 7.7* 7.5*  HCT 24.1* 20.3* 22.4* 22.0*  MCV 90.6 91.4  --  89.4  MCH 29.3 29.7  --  30.5  MCHC 32.4 32.5  --  34.1  RDW 13.7 13.5  --  13.8  PLT 315 316  --  259    BNP No results for input(s): BNP, PROBNP in the last 168 hours.    DDimer  No results for input(s): DDIMER in the last 168 hours.    CHA2DS2-VASc Score = 5   This indicates a 7.2% annual risk of stroke. The patient's score is based upon: CHF History: 0 HTN History: 1 Diabetes History: 0 Stroke History: 2 Vascular Disease History: 1 Age Score: 1 Gender Score: 0     Radiology    DG CHEST PORT 1 VIEW Result Date: 09/08/2023 CLINICAL DATA:  [id number] [facility]) [id number] EXAM: PORTABLE CHEST 1 VIEW COMPARISON:  CT chest dated 09/07/2023. Chest radiograph dated 09/06/2023. FINDINGS: Right  pigtail pleural catheter in similar position. No significant change in multiloculated right pleural effusion with right basilar opacities. The left lung is clear. No pneumothorax. Stable cardiomediastinal contours. Prior median sternotomy and CABG. Visualized osseous structures are unchanged. IMPRESSION: Right pigtail pleural catheter in similar position. No significant change in multiloculated right pleural effusion with right basilar opacities. Electronically Signed   By: Harrietta Sherry M.D.   On: 09/08/2023 09:09   CT ANGIO CHEST AORTA W/CM & OR WO/CM Result Date: 09/07/2023 CLINICAL DATA:  Pleural effusion, concern for hemothorax post TPA/DNASE for empyema EXAM: CT ANGIOGRAPHY CHEST WITH AND WITHOUT CONTRAST TECHNIQUE: Non-contrast CT of the chest was initially obtained. Multidetector CT imaging through the chest was performed using the standard protocol during bolus administration of intravenous contrast. Multiplanar reconstructed images and MIPs were obtained and reviewed to evaluate the vascular anatomy. RADIATION DOSE REDUCTION: This exam was performed according to the departmental dose-optimization program which includes automated exposure control, adjustment of the mA and/or kV according to patient size and/or use of iterative reconstruction technique. CONTRAST:  75mL OMNIPAQUE  IOHEXOL  350 MG/ML SOLN COMPARISON:  September 03, 2023 FINDINGS: Pulmonary Embolism: No pulmonary embolism. Cardiovascular: No cardiomegaly or pericardial effusion. No aortic aneurysm, intramural hematoma, or aortic dissection. CABG changes. Extensive multi-vessel coronary atherosclerosis. Mediastinum/Nodes: No mediastinal mass. No mediastinal, hilar, or axillary lymphadenopathy. Lungs/Pleura: The midline trachea  and bronchi are patent. Decreased size of the multiloculated right pleural effusion with multifocal compressive atelectasis in the right lung. Percutaneous pigtail thoracostomy tube in place within the largest pocket  along the dependent lower pleura. Multifocal subsegmental atelectasis also noted in the lingula and left lower lobe. No pneumothorax. Intermediate density material layering within the inferior pleural effusion (axial 33). Multiple small locules of gas noted in the effusion, likely related to the thoracostomy tube. No extravasation of contrast within the pleural space to suggest active bleeding, at this time. Musculoskeletal: No acute fracture or destructive bone lesion. Osteopenia. Multilevel degenerative disc disease of the spine. Upper Abdomen: No acute abnormality within the partially visualized upper abdomen. Multiple small gallstones again noted. Small volume symmetric bilateral gynecomastia. Review of the MIP images confirms the above findings. IMPRESSION: 1. Interval decrease in size of the multiloculated right pleural effusion with adjacent compressive atelectasis throughout the right lung. Well-positioned pigtail thoracostomy tube. Intermediate density material layering in the inferior aspect of the pleural effusion, which may represent infectious debris or evolving blood products. No extravasation of contrast to suggest active bleeding, at this time. 2. No pneumonia, pulmonary edema, or pneumothorax. 3. Cholecystolithiasis. Aortic Atherosclerosis (ICD10-I70.0). Electronically Signed   By: Rogelia Myers M.D.   On: 09/07/2023 10:19   DG CHEST PORT 1 VIEW Result Date: 09/06/2023 CLINICAL DATA:  Pleural effusion EXAM: PORTABLE CHEST 1 VIEW COMPARISON:  09/06/2023 FINDINGS: Right basilar pigtail pleural catheter in unchanged position with similar acute angle projecting over the right hemidiaphragm. Right pleural effusion and associated airspace opacities in the right lung. Low lung volumes accentuate pulmonary vascularity and cardiomediastinal silhouette. No pneumothorax. Sternotomy and CABG. Gas-filled loops of colon in the upper abdomen. IMPRESSION: 1. Right basilar pigtail pleural catheter in unchanged  position with acute angle projecting over the diaphragm. 2. Right pleural effusion and associated airspace opacities in the right lung are unchanged. Electronically Signed   By: Norman Gatlin M.D.   On: 09/06/2023 22:57   DG CHEST PORT 1 VIEW Result Date: 09/06/2023 CLINICAL DATA:  [place]. EXAM: PORTABLE CHEST 1 VIEW COMPARISON:  Radiograph yesterday FINDINGS: Stable positioning of right basilar pigtail catheter. The tubing is noted to be looped or a kinked projecting over the mid aspect of the hemithorax. Grossly unchanged right basilar opacity and pleural thickening/effusion. No evidence of pneumothorax. Overall low lung volumes are unchanged. Prior median sternotomy with stable heart size. IMPRESSION: 1. Stable positioning of right basilar pigtail catheter. The tubing is noted to be looped or kinked projecting over the mid aspect of the hemithorax. 2. Grossly unchanged right basilar opacity and pleural thickening/effusion. Electronically Signed   By: Andrea Gasman M.D.   On: 09/06/2023 15:20    Patient Profile     73 y.o. male  with a hx of hypertension, hyperlipidemia, carotid artery stenosis s/p endarterectomy in 2018, prior TIA, prior stroke, Crohn's disease, ulcerative colitis, and multivessel CAD s/p CABG 2005 and PCI to SVG to RI in 2008 who is being seen 09/01/2023 for the evaluation of new onset atrial fibrillation at the request of CHRISTOBAL Guadalajara MD.   Assessment & Plan    #New onset atrial fibrillation with RVR - NSR on oral amiodarone  now 200 mg bid - 2D echo -- mild MR, normal biatrial size.   - TSH normal 1.5 - CHADS2VASC score  5 (hypertension, age> 65, history of CVA, CAD) -no long term oral anticoagulation per Dr Nancey EP due to frequent falls - Currently on IV heparin  drip d/c  on d/c given recent conversion from afib   # Back Pain - Neurosurgery consult appreciated for acute on chronic low back pain with MRI showing mild generalized spondylolysis and DJD.  Plan is for pain  management follow-up outpatient    #ASCAD #Hyperlipidemia -s/p remote history of CABG 2005 -s/p  PCI of SVG to RI in 2008  -Denies any recent anginal symptoms prior to having all the issues with back pain but then started having sharp chest pain in the right chest that seem to radiate from the back into the right chest -High-sensitivity troponin is normal (5>>4) -2D echo ok -Continue aspirin  81 mg daily, atorvastatin  80 mg daily    For questions or updates, please contact [facility] Please consult [email or web address] for contact info under        Signed, Maude Emmer, MD  09/08/2023, 9:18 AM

## 2023-09-08 NOTE — Progress Notes (Signed)
 Occupational Therapy Treatment Patient Details Name: Jacob [name] MRN: [id number] DOB: 1950-05-29 Today's Date: 09/08/2023   History of present illness 73 y.o. male admitted 8/12 with A-fib with RVR and severe back pain. MRI shows mild generalized spondylosis, L5-S1 facet arthropathy, and L3-4 degenerative disease. 8/15 Rt pleural effusion s/p pigtail catheter. PMH: Crohn's disease, CAD s/p CABG, CVA, carotid artery stenosis c/p CEA, HTN, HLD   OT comments  Pt progressing well towards goals. Progressed to complete functional transfers with CGA. Improved standing balance with single UE support. Educated family on use of shower chair for energy conservation. Pt continues to be limited by decreased strength and activity tolerance with standing ADLs. Updated d/c recommendations to HHOT to optimize independence levels. Will continue to follow acutely.       If plan is discharge home, recommend the following:  A little help with walking and/or transfers;A lot of help with bathing/dressing/bathroom;Assistance with cooking/housework;Assist for transportation;Help with stairs or ramp for entrance   Equipment Recommendations  None recommended by OT       Precautions / Restrictions Precautions Precautions: Fall Recall of Precautions/Restrictions: Intact Restrictions Weight Bearing Restrictions Per Provider Order: No       Mobility Bed Mobility Overal bed mobility: Needs Assistance Bed Mobility: Supine to Sit     Supine to sit: Contact guard     General bed mobility comments: HOB elevated, slow to complete but no assist    Transfers Overall transfer level: Needs assistance Equipment used: Rolling walker (2 wheels) Transfers: Sit to/from Stand, Bed to chair/wheelchair/BSC Sit to Stand: Contact guard assist           General transfer comment: CGA from bed height, short step pivot to recliner CGA     Balance Overall balance assessment: Needs assistance Sitting-balance  support: No upper extremity supported, Feet supported Sitting balance-Leahy Scale: Good Sitting balance - Comments: EOB without support   Standing balance support: Bilateral upper extremity supported, Reliant on assistive device for balance, During functional activity Standing balance-Leahy Scale: Fair Standing balance comment: RW in standing         ADL either performed or assessed with clinical judgement   ADL Overall ADL's : Needs assistance/impaired     Toilet Transfer: Contact guard assist;Rolling walker (2 wheels);Ambulation Toilet Transfer Details (indicate cue type and reason): Short distance simulated in room Toileting- Clothing Manipulation and Hygiene: Contact guard assist;Sit to/from stand         General ADL Comments: Decreased activity tolerance    Extremity/Trunk Assessment Upper Extremity Assessment Upper Extremity Assessment: Generalized weakness   Lower Extremity Assessment Lower Extremity Assessment: Defer to PT evaluation        Vision   Vision Assessment?: No apparent visual deficits         Communication Communication Communication: No apparent difficulties   Cognition Arousal: Alert, Suspect due to medications Behavior During Therapy: Flat affect Cognition: Cognition impaired           Executive functioning impairment (select all impairments): Problem solving OT - Cognition Comments: Difficulty probelm solving during mobility, decreased processing speed, overall WFL       Following commands: Intact        Cueing   Cueing Techniques: Verbal cues        General Comments Slightly elevated HR 121, O2 stats stable on RA    Pertinent Vitals/ Pain       Pain Assessment Pain Assessment: Faces Faces Pain Scale: Hurts even more Pain Descriptors / Indicators: Aching, Sore,  Constant Pain Intervention(s): Monitored during session   Frequency  Min 2X/week        Progress Toward Goals  OT Goals(current goals can now be found  in the care plan section)  Progress towards OT goals: Progressing toward goals  Acute Rehab OT Goals Patient Stated Goal: To go home OT Goal Formulation: With patient Time For Goal Achievement: 09/16/23 Potential to Achieve Goals: Good ADL Goals Pt Will Perform Grooming: standing;with modified independence Pt Will Perform Upper Body Bathing: with set-up;sitting Pt Will Perform Lower Body Bathing: with min assist Pt Will Perform Upper Body Dressing: with set-up;sitting Pt Will Perform Lower Body Dressing: with mod assist Pt Will Transfer to Toilet: with modified independence;ambulating;bedside commode Pt Will Perform Toileting - Clothing Manipulation and hygiene: with min assist Additional ADL Goal #1: Pt will be Mod I in and OOB for basic ADLs  Plan         AM-PAC OT 6 Clicks Daily Activity     Outcome Measure   Help from another person eating meals?: None Help from another person taking care of personal grooming?: A Little Help from another person toileting, which includes using toliet, bedpan, or urinal?: A Little Help from another person bathing (including washing, rinsing, drying)?: A Lot Help from another person to put on and taking off regular upper body clothing?: A Little Help from another person to put on and taking off regular lower body clothing?: A Lot 6 Click Score: 17    End of Session Equipment Utilized During Treatment: Rolling walker (2 wheels)  OT Visit Diagnosis: Unsteadiness on feet (R26.81);Other abnormalities of gait and mobility (R26.89);Pain   Activity Tolerance Patient tolerated treatment well   Patient Left in chair;with call bell/phone within reach;with family/visitor present   Nurse Communication Mobility status        Time: 8590-8567 OT Time Calculation (min): 23 min  Charges: OT General Charges $OT Visit: 1 Visit OT Treatments $Self Care/Home Management : 8-22 mins  Adrianne BROCKS, OT  Acute Rehabilitation Services Office  [phone] Secure chat preferred   Adrianne GORMAN Savers 09/08/2023, 3:00 PM

## 2023-09-08 NOTE — Progress Notes (Signed)
 Progress Note   Patient: Jacob [name] FMW:[id number] DOB: 06/21/1950 DOA: 08/31/2023     8 DOS: the patient was seen and examined on 09/08/2023 at 10:06AM      Brief hospital course: 73 y.o. M with HTN, Crohn's disease not on disease specific therapy, CAD s/p CABG 2008, hx TIA, hx carotid disease s/p R CEA 2018 who presented with subacute weakness, falls, dizziness and worsening back pain.  Seen three times in ER and third time was in Afib with RVR and so admitted.  CT on admission 8/12 showed pleural effusion.  Cardiology consulted for Afib, Neurosurgery consulted for back pain.  Afib controlled, NSGY recommended outpatient follow up.  Subsequently underwent thoracentesis 8/14 that showed empyema, pleural fluid culture growing MSSA.  Chest tube placed 8/15.  Lytics given 8/15, 8/16, 8/17 and 8/18 then 8/18 overnight started to have larger volume dark red drainage from chest tube.  Placed to water  seal, IV heparin  stopped, Hgb down to 6.6, transfused 2u PRBCs on 8/19. CT surgery consulted.     Assessment and Plan: MSSA empyema - Continue cefazolin  - Consult ID, apprecaite cares - Chest tube per Pulmonology, appreciate cares - CT surgery consulted, recommend non-operative mgmt for now   Hemothorax Acute blood loss anemia Hgb stable today - Hold heparin /anticoagulation - Trend Hgb this afternoon - Transfusion threshold 7 g/dL - Okay to continue aspirin  for now  Atrial fibrillation with RVR, new onset New onset.  CHA2DS2-Vasc 5 (age, HTN, stroke, vascular disease).  On heparin  but held due to #2.   Back in sinus on amiodarone .  TSH normal, echo unremarkable. - Hold anticoagulation for now - Continue amiodarone  - Cards are thinking no AC at discharge - Home metoprolol  stopped  Acute kidney injury Developed soft BP with diltiazem  drip, Cr up to 2.36 over 48 hours, treated conservatively and resolved to normal baseline Cr 1.1  Coronary disease Hypertension - Continue  aspirin , Lipitor - Hold amlodipine , lisinopril , metoprolol   Crohn's disease Quiescent many yaers, not on disease specific therapy  Chronic back pain Unclear if his presenting complaint was spine related or empyema related.  MRI here showed pretty typical degenerative disease. Seen by NSGY here who recommended outpatient pain medicine follow up. - Outaptient referral  New rash Since last three days.  Itching, red papules on back.  Seemed to pop up after lots of sweating into the bedsheets while in ICU. Low suspicion for drug rash at present - Monitor - HC cream       Subjective: Has a new rash on his back.  Overall breathing and pain are unchanged, tolerable.  No confusion, no fever.     Physical Exam: BP 133/79 (BP Location: Right Arm)   Pulse 69   Temp 98 F (36.7 C) (Oral)   Resp 19   Ht 5' 11 (1.803 m)   Wt 95.4 kg   SpO2 96%   BMI 29.33 kg/m   Elderly adult male, lying in bed, interactive and appropriate Heart RRR, soft systolic murmur, no peripheral edema Respiratory normal, lung sounds diminished on the right but not too bad, normal on the left, no rales or wheezes appreciated Abdomen soft, no tenderness to palpation Attention normal, affect normal, judgment and insight appear normal, generalized weakness but symmetric strength     Data Reviewed: Basic metabolic panel shows normal electrolytes and renal function CBC shows improved leukocytosis, hemoglobin relatively stable at 7.5 Discussed with infectious disease    Family Communication: Wife and son at the bedside  Disposition: Status is: Inpatient         Author: Lonni SHAUNNA Dalton, MD 09/08/2023 2:09 PM  For on call review [email or web address].

## 2023-09-08 NOTE — Progress Notes (Signed)
 [facility]    Date of Admission:  08/31/2023   Total days of antibiotics 6   ID: Jacob [name] is a 73 y.o. male with MSSA right sided empyema s/p chest tube drainage Principal Problem:   Atrial fibrillation with RVR (HCC) Active Problems:   Coronary artery disease due to lipid rich plaque   Essential hypertension, benign   Crohn's colitis (HCC)   Acute on chronic back pain   Abdominal pain   Dizziness   Frequent falls   Acute low back pain    Subjective: Afebrile. Feeling better than yesterday, where he had nw onset afib with RVR. Output from chest tube roughly  Reports having pruritic rash on back   Family at bedside  Medications:   sodium chloride    Intravenous Once   acetaminophen   1,000 mg Oral TID   amiodarone   200 mg Oral BID   aspirin  EC  81 mg Oral Daily   atorvastatin   80 mg Oral Daily   feeding supplement  237 mL Oral BID BM   gabapentin   100 mg Oral Q8H   hydrocortisone  cream   Topical TID   HYDROmorphone    Intravenous Q4H   latanoprost   1 drop Both Eyes QHS   lidocaine   1 patch Transdermal Q24H   And   lidocaine   1 patch Transdermal Q24H   polyethylene glycol  17 g Oral BID   senna-docusate  1 tablet Oral BID   sodium chloride  flush  10 mL Intrapleural Q8H   sodium chloride  flush  3 mL Intravenous Q12H   tamsulosin   0.4 mg Oral QHS    Objective: Vital signs in last 24 hours: Temp:  [98 F (36.7 C)-98.3 F (36.8 C)] 98 F (36.7 C) (08/20 1658) Pulse Rate:  [67-75] 73 (08/20 1658) Resp:  [16-23] 16 (08/20 1658) BP: (125-147)/(64-79) 127/66 (08/20 1658) SpO2:  [94 %-100 %] 95 % (08/20 1658) Physical Exam  Constitutional: He is oriented to person, place, and time. He appears well-developed and well-nourished. No distress.  HENT:  Mouth/Throat: Oropharynx is clear and moist. No oropharyngeal exudate.  Cardiovascular: Normal rate, regular rhythm and normal heart sounds. Exam reveals no gallop and no friction  rub.  No murmur heard.  Pulmonary/Chest: Effort normal and breath sounds normal. No respiratory distress. He has no wheezes.  Abdominal: Soft. Bowel sounds are normal. He exhibits no distension. There is no tenderness.  Lymphadenopathy:  He has no cervical adenopathy.  Neurological: He is alert and oriented to person, place, and time.  Skin: Skin is warm and dry. Back has scattered macular papular rash more so towards top of shoulders. No surrounding cellulitis. Psychiatric: He has a normal mood and affect. His behavior is normal.    Lab Results Recent Labs    09/07/23 0852 09/07/23 1851 09/08/23 0228  WBC 12.7*  --  11.0*  HGB 6.6* 7.7* 7.5*  HCT 20.3* 22.4* 22.0*  NA 133*  --  135  K 4.1  --  4.0  CL 102  --  100  CO2 24  --  24  BUN 18  --  15  CREATININE 1.11  --  1.02   Liver Panel No results for input(s): PROT, ALBUMIN, AST, ALT, ALKPHOS, BILITOT, BILIDIR, IBILI in the last 72 hours. Sedimentation Rate No results for input(s): ESRSEDRATE in the last 72 hours. C-Reactive Protein No results for input(s): CRP in the last 72 hours.  Microbiology: 8/15 blood cx NGTD 8/14 pleural fluid  MSSA Studies/Results: DG CHEST PORT 1 VIEW Result Date: 09/08/2023 CLINICAL DATA:  [id number] [facility]) [id number] EXAM: PORTABLE CHEST 1 VIEW COMPARISON:  CT chest dated 09/07/2023. Chest radiograph dated 09/06/2023. FINDINGS: Right pigtail pleural catheter in similar position. No significant change in multiloculated right pleural effusion with right basilar opacities. The left lung is clear. No pneumothorax. Stable cardiomediastinal contours. Prior median sternotomy and CABG. Visualized osseous structures are unchanged. IMPRESSION: Right pigtail pleural catheter in similar position. No significant change in multiloculated right pleural effusion with right basilar opacities. Electronically Signed   By: Harrietta Sherry M.D.   On: 09/08/2023 09:09   CT ANGIO CHEST AORTA W/CM &  OR WO/CM Result Date: 09/07/2023 CLINICAL DATA:  Pleural effusion, concern for hemothorax post TPA/DNASE for empyema EXAM: CT ANGIOGRAPHY CHEST WITH AND WITHOUT CONTRAST TECHNIQUE: Non-contrast CT of the chest was initially obtained. Multidetector CT imaging through the chest was performed using the standard protocol during bolus administration of intravenous contrast. Multiplanar reconstructed images and MIPs were obtained and reviewed to evaluate the vascular anatomy. RADIATION DOSE REDUCTION: This exam was performed according to the departmental dose-optimization program which includes automated exposure control, adjustment of the mA and/or kV according to patient size and/or use of iterative reconstruction technique. CONTRAST:  75mL OMNIPAQUE  IOHEXOL  350 MG/ML SOLN COMPARISON:  September 03, 2023 FINDINGS: Pulmonary Embolism: No pulmonary embolism. Cardiovascular: No cardiomegaly or pericardial effusion. No aortic aneurysm, intramural hematoma, or aortic dissection. CABG changes. Extensive multi-vessel coronary atherosclerosis. Mediastinum/Nodes: No mediastinal mass. No mediastinal, hilar, or axillary lymphadenopathy. Lungs/Pleura: The midline trachea and bronchi are patent. Decreased size of the multiloculated right pleural effusion with multifocal compressive atelectasis in the right lung. Percutaneous pigtail thoracostomy tube in place within the largest pocket along the dependent lower pleura. Multifocal subsegmental atelectasis also noted in the lingula and left lower lobe. No pneumothorax. Intermediate density material layering within the inferior pleural effusion (axial 33). Multiple small locules of gas noted in the effusion, likely related to the thoracostomy tube. No extravasation of contrast within the pleural space to suggest active bleeding, at this time. Musculoskeletal: No acute fracture or destructive bone lesion. Osteopenia. Multilevel degenerative disc disease of the spine. Upper Abdomen: No  acute abnormality within the partially visualized upper abdomen. Multiple small gallstones again noted. Small volume symmetric bilateral gynecomastia. Review of the MIP images confirms the above findings. IMPRESSION: 1. Interval decrease in size of the multiloculated right pleural effusion with adjacent compressive atelectasis throughout the right lung. Well-positioned pigtail thoracostomy tube. Intermediate density material layering in the inferior aspect of the pleural effusion, which may represent infectious debris or evolving blood products. No extravasation of contrast to suggest active bleeding, at this time. 2. No pneumonia, pulmonary edema, or pneumothorax. 3. Cholecystolithiasis. Aortic Atherosclerosis (ICD10-I70.0). Electronically Signed   By: Rogelia Myers M.D.   On: 09/07/2023 10:19   DG CHEST PORT 1 VIEW Result Date: 09/06/2023 CLINICAL DATA:  Pleural effusion EXAM: PORTABLE CHEST 1 VIEW COMPARISON:  09/06/2023 FINDINGS: Right basilar pigtail pleural catheter in unchanged position with similar acute angle projecting over the right hemidiaphragm. Right pleural effusion and associated airspace opacities in the right lung. Low lung volumes accentuate pulmonary vascularity and cardiomediastinal silhouette. No pneumothorax. Sternotomy and CABG. Gas-filled loops of colon in the upper abdomen. IMPRESSION: 1. Right basilar pigtail pleural catheter in unchanged position with acute angle projecting over the diaphragm. 2. Right pleural effusion and associated airspace opacities in the right lung are unchanged. Electronically Signed   By: Norman Gatlin  M.D.   On: 09/06/2023 22:57     Assessment/Plan: MSSA complicated empyema s/p chest tube with pleuradesis. - continue on iv cefazolin  2gm IV Q 8hr.  Will discuss transition to oral abtx  when ready for discharge antiicpated for a total of 4 wk.  Rash = likely heat rash. Will recommend hydrocortisone  if needed for itching. Does not appear to be  consistent with abtx rash  Back pain = anticipate that he will need to transition to oral pain regimen once off of pca.  [facility] for Infectious Diseases Pager: [phone]  09/08/2023, 6:45 PM

## 2023-09-08 NOTE — Progress Notes (Signed)
 Physical Therapy Treatment Patient Details Name: Jacob [name] MRN: [id number] DOB: [birth year] Today's Date: 09/08/2023   History of Present Illness 73 y.o. male admitted 8/12 with A-fib with RVR and severe back pain. MRI shows mild generalized spondylosis, L5-S1 facet arthropathy, and L3-4 degenerative disease. 8/15 Rt pleural effusion s/p pigtail catheter. PMH: Crohn's disease, CAD s/p CABG, CVA, carotid artery stenosis c/p CEA, HTN, HLD    PT Comments  Pt is progressing well towards goals. Currently pt is CGA for bed mobility, CGA for sit to stand and Min A for transfers with assist navigating AD and lines/leads. Pt requires Min A for balance with transfer. Due to pt current functional status, home set up and available assistance at home recommending skilled physical therapy services 3x/week in order to address strength, balance and functional mobility to decrease risk for falls, injury and re-hospitalization.       If plan is discharge home, recommend the following: A little help with walking and/or transfers;Assistance with cooking/housework;Assist for transportation;Help with stairs or ramp for entrance   Can travel by private vehicle     Yes  Equipment Recommendations  None recommended by PT       Precautions / Restrictions Precautions Precautions: Fall Recall of Precautions/Restrictions: Intact Restrictions Weight Bearing Restrictions Per Provider Order: No     Mobility  Bed Mobility Overal bed mobility: Needs Assistance Bed Mobility: Supine to Sit     Supine to sit: Contact guard     General bed mobility comments: HOB 25 degrees; CGA to rise to sitting EOB    Transfers Overall transfer level: Needs assistance Equipment used: Rolling walker (2 wheels) Transfers: Sit to/from Stand Sit to Stand: Contact guard assist           General transfer comment: cues for hand placement    Ambulation/Gait Ambulation/Gait assistance: Contact guard assist Gait  Distance (Feet): 2 Feet   Gait Pattern/deviations: Step-through pattern, Decreased stride length, Trunk flexed Gait velocity: decreased Gait velocity interpretation: <1.8 ft/sec, indicate of risk for recurrent falls   General Gait Details: Pt with verbal cues for stepping and AD navigation      Balance Overall balance assessment: Needs assistance Sitting-balance support: No upper extremity supported, Feet supported Sitting balance-Leahy Scale: Good Sitting balance - Comments: EOB without support   Standing balance support: Bilateral upper extremity supported, Reliant on assistive device for balance, During functional activity Standing balance-Leahy Scale: Fair Standing balance comment: RW in standing      Communication Communication Communication: No apparent difficulties  Cognition Arousal: Alert, Suspect due to medications Behavior During Therapy: Flat affect   PT - Cognitive impairments: Problem solving       PT - Cognition Comments: slow processing and anticipate due to medication Following commands: Intact      Cueing Cueing Techniques: Verbal cues     General Comments General comments (skin integrity, edema, etc.): Pt pain improving. HR 121 bpm during activity. O2 sats up to 94% with activity on room air. Family in room.      Pertinent Vitals/Pain Pain Assessment Pain Assessment: Faces Faces Pain Scale: Hurts even more Pain Descriptors / Indicators: Aching, Sore, Constant Pain Intervention(s): Monitored during session, Limited activity within patient's tolerance, PCA encouraged     PT Goals (current goals can now be found in the care plan section) Acute Rehab PT Goals Patient Stated Goal: Reduce back pain. PT Goal Formulation: With patient/family Time For Goal Achievement: 09/16/23 Potential to Achieve Goals: Good Progress towards PT goals: Progressing  toward goals    Frequency    Min 2X/week      PT Plan  Continue with current POC         AM-PAC PT 6 Clicks Mobility   Outcome Measure  Help needed turning from your back to your side while in a flat bed without using bedrails?: A Little Help needed moving from lying on your back to sitting on the side of a flat bed without using bedrails?: A Little Help needed moving to and from a bed to a chair (including a wheelchair)?: A Little Help needed standing up from a chair using your arms (e.g., wheelchair or bedside chair)?: A Little Help needed to walk in hospital room?: A Little Help needed climbing 3-5 steps with a railing? : A Little 6 Click Score: 18    End of Session   Activity Tolerance: Patient tolerated treatment well Patient left: in chair;with call bell/phone within reach;with family/visitor present Nurse Communication: Mobility status PT Visit Diagnosis: Other abnormalities of gait and mobility (R26.89);Muscle weakness (generalized) (M62.81);History of falling (Z91.81);Difficulty in walking, not elsewhere classified (R26.2);Pain     Time: 8586-8564 PT Time Calculation (min) (ACUTE ONLY): 22 min  Charges:    $Therapeutic Activity: 8-22 mins PT General Charges $$ ACUTE PT VISIT: 1 Visit                    Jacob [name], DPT, CLT  Acute Rehabilitation Services Office: [phone] (Secure chat preferred)    Jacob [name] 09/08/2023, 2:42 PM

## 2023-09-09 ENCOUNTER — Inpatient Hospital Stay (HOSPITAL_COMMUNITY)

## 2023-09-09 ENCOUNTER — Encounter (HOSPITAL_COMMUNITY): Payer: Self-pay | Admitting: Cardiology

## 2023-09-09 DIAGNOSIS — I4891 Unspecified atrial fibrillation: Secondary | ICD-10-CM | POA: Diagnosis not present

## 2023-09-09 LAB — COMPREHENSIVE METABOLIC PANEL WITH GFR
ALT: 18 U/L (ref 0–44)
AST: 41 U/L (ref 15–41)
Albumin: 1.8 g/dL — ABNORMAL LOW (ref 3.5–5.0)
Alkaline Phosphatase: 70 U/L (ref 38–126)
Anion gap: 10 (ref 5–15)
BUN: 13 mg/dL (ref 8–23)
CO2: 25 mmol/L (ref 22–32)
Calcium: 7.9 mg/dL — ABNORMAL LOW (ref 8.9–10.3)
Chloride: 102 mmol/L (ref 98–111)
Creatinine, Ser: 1.07 mg/dL (ref 0.61–1.24)
GFR, Estimated: >60 mL/min (ref >60)
Glucose, Bld: 142 mg/dL — ABNORMAL HIGH (ref 70–99)
Potassium: 3.7 mmol/L (ref 3.5–5.1)
Sodium: 137 mmol/L (ref 135–145)
Total Bilirubin: 0.4 mg/dL (ref 0.0–1.2)
Total Protein: 5.2 g/dL — ABNORMAL LOW (ref 6.5–8.1)

## 2023-09-09 LAB — HEMOGLOBIN AND HEMATOCRIT, BLOOD
HCT: 25.3 % — ABNORMAL LOW (ref 39.0–52.0)
Hemoglobin: 8.5 g/dL — ABNORMAL LOW (ref 13.0–17.0)

## 2023-09-09 LAB — CBC
HCT: 20.6 % — ABNORMAL LOW (ref 39.0–52.0)
Hemoglobin: 6.8 g/dL — CL (ref 13.0–17.0)
MCH: 30 pg (ref 26.0–34.0)
MCHC: 33 g/dL (ref 30.0–36.0)
MCV: 90.7 fL (ref 80.0–100.0)
Platelets: 249 K/uL (ref 150–400)
RBC: 2.27 MIL/uL — ABNORMAL LOW (ref 4.22–5.81)
RDW: 14.2 % (ref 11.5–15.5)
WBC: 10.9 K/uL — ABNORMAL HIGH (ref 4.0–10.5)
nRBC: 0.4 % — ABNORMAL HIGH (ref 0.0–0.2)

## 2023-09-09 LAB — PREPARE RBC (CROSSMATCH)

## 2023-09-09 MED ORDER — SODIUM CHLORIDE 0.9% IV SOLUTION: Freq: Once | INTRAVENOUS | Status: AC

## 2023-09-09 MED ORDER — OXYCODONE HCL 5 MG PO TABS
5.0000 mg | ORAL_TABLET | ORAL | Status: DC | PRN
  Administered 2023-09-09: 5 mg via ORAL
  Filled 2023-09-09: qty 1

## 2023-09-09 NOTE — Plan of Care (Signed)
 Hemoglobin dropped to 6.8 today morning.  Transfusing 1 unit of blood.  No active source of bleeding except small amount of bleeding from chest tube.

## 2023-09-09 NOTE — Plan of Care (Signed)
  Problem: Clinical Measurements: Goal: Diagnostic test results will improve Outcome: Progressing Goal: Respiratory complications will improve Outcome: Progressing Goal: Cardiovascular complication will be avoided Outcome: Progressing   Problem: Activity: Goal: Risk for activity intolerance will decrease Outcome: Progressing   Problem: Nutrition: Goal: Adequate nutrition will be maintained Outcome: Progressing   Problem: Coping: Goal: Level of anxiety will decrease Outcome: Progressing   Problem: Pain Managment: Goal: General experience of comfort will improve and/or be controlled Outcome: Progressing   Problem: Safety: Goal: Ability to remain free from injury will improve Outcome: Progressing

## 2023-09-09 NOTE — Progress Notes (Signed)
 Progress Note  Patient Name: Jacob [name] Date of Encounter: 09/09/2023  [facility] Cardiologist: Wilbert Bihari, MD    Subjective   No cardiac complaints still in NSR   Inpatient Medications    Scheduled Meds:  sodium chloride    Intravenous Once   acetaminophen   1,000 mg Oral TID   amiodarone   200 mg Oral BID   aspirin  EC  81 mg Oral Daily   atorvastatin   80 mg Oral Daily   feeding supplement  237 mL Oral BID BM   gabapentin   100 mg Oral Q8H   hydrocortisone  cream   Topical TID   HYDROmorphone    Intravenous Q4H   latanoprost   1 drop Both Eyes QHS   lidocaine   1 patch Transdermal Q24H   And   lidocaine   1 patch Transdermal Q24H   polyethylene glycol  17 g Oral BID   senna-docusate  1 tablet Oral BID   sodium chloride  flush  10 mL Intrapleural Q8H   sodium chloride  flush  3 mL Intravenous Q12H   tamsulosin   0.4 mg Oral QHS   Continuous Infusions:   ceFAZolin  (ANCEF ) IV 2 g (09/09/23 0028)   PRN Meds: bisacodyl , diazepam , diphenhydrAMINE  **OR** diphenhydrAMINE , diphenhydrAMINE -zinc  acetate, naloxone  **AND** sodium chloride  flush, prochlorperazine    Vital Signs    Vitals:   09/09/23 0434 09/09/23 0437 09/09/23 0655 09/09/23 0732  BP:  (!) 112/57 138/68 (!) 164/85  Pulse:  68 72 77  Resp: 14 20 12  (!) 27  Temp:  98.9 F (37.2 C) 98.9 F (37.2 C) 98.7 F (37.1 C)  TempSrc:  Oral Oral Oral  SpO2: 99% 99% 97% 97%  Weight:      Height:        Intake/Output Summary (Last 24 hours) at 09/09/2023 0836 Last data filed at 09/09/2023 0744 Gross per 24 hour  Intake 1019 ml  Output 1300 ml  Net -281 ml      09/01/2023   11:18 AM 08/31/2023    6:30 PM 08/28/2023    1:14 PM  Last 3 Weights  Weight (lbs) 210 lb 5.1 oz 202 lb 214 lb  Weight (kg) 95.4 kg 91.627 kg 97.07 kg      Telemetry    Atrial fibrillation with RVR as high as the 160's- Personally Reviewed  ECG    No new EKG to review- Personally Reviewed  Physical Exam   Alert Chest tube  right lung No murmur prior sternotomy Prior right CEA Abdomen benign Trace edema  Labs    High Sensitivity Troponin:   Recent Labs  Lab 08/31/23 1856 08/31/23 2012  TROPONINIHS 5 4      Chemistry Recent Labs  Lab 09/04/23 0221 09/05/23 0222 09/07/23 0852 09/08/23 0228 09/09/23 0228  NA 130*   < > 133* 135 137  K 4.1   < > 4.1 4.0 3.7  CL 96*   < > 102 100 102  CO2 23   < > 24 24 25   GLUCOSE 126*   < > 125* 117* 142*  BUN 19   < > 18 15 13   CREATININE 1.29*   < > 1.11 1.02 1.07  CALCIUM  8.4*   < > 7.6* 7.8* 7.9*  PROT 6.3*  --   --   --  5.2*  ALBUMIN 2.1*  --   --   --  1.8*  AST 24  --   --   --  41  ALT 37  --   --   --  18  ALKPHOS 79  --   --   --  70  BILITOT 0.5  --   --   --  0.4  GFRNONAA 59*   < > >60 >60 >60  ANIONGAP 11   < > 7 11 10    < > = values in this interval not displayed.     Hematology Recent Labs  Lab 09/07/23 0852 09/07/23 1851 09/08/23 0228 09/08/23 1933 09/09/23 0228  WBC 12.7*  --  11.0*  --  10.9*  RBC 2.22*  --  2.46*  --  2.27*  HGB 6.6*   < > 7.5* 7.7* 6.8*  HCT 20.3*   < > 22.0* 23.2* 20.6*  MCV 91.4  --  89.4  --  90.7  MCH 29.7  --  30.5  --  30.0  MCHC 32.5  --  34.1  --  33.0  RDW 13.5  --  13.8  --  14.2  PLT 316  --  259  --  249   < > = values in this interval not displayed.    BNP No results for input(s): BNP, PROBNP in the last 168 hours.    DDimer  No results for input(s): DDIMER in the last 168 hours.    CHA2DS2-VASc Score = 5   This indicates a 7.2% annual risk of stroke. The patient's score is based upon: CHF History: 0 HTN History: 1 Diabetes History: 0 Stroke History: 2 Vascular Disease History: 1 Age Score: 1 Gender Score: 0     Radiology    DG CHEST PORT 1 VIEW Result Date: 09/08/2023 CLINICAL DATA:  [id number] [facility]) [id number] EXAM: PORTABLE CHEST 1 VIEW COMPARISON:  CT chest dated 09/07/2023. Chest radiograph dated 09/06/2023. FINDINGS: Right pigtail pleural catheter in  similar position. No significant change in multiloculated right pleural effusion with right basilar opacities. The left lung is clear. No pneumothorax. Stable cardiomediastinal contours. Prior median sternotomy and CABG. Visualized osseous structures are unchanged. IMPRESSION: Right pigtail pleural catheter in similar position. No significant change in multiloculated right pleural effusion with right basilar opacities. Electronically Signed   By: Harrietta Sherry M.D.   On: 09/08/2023 09:09   CT ANGIO CHEST AORTA W/CM & OR WO/CM Result Date: 09/07/2023 CLINICAL DATA:  Pleural effusion, concern for hemothorax post TPA/DNASE for empyema EXAM: CT ANGIOGRAPHY CHEST WITH AND WITHOUT CONTRAST TECHNIQUE: Non-contrast CT of the chest was initially obtained. Multidetector CT imaging through the chest was performed using the standard protocol during bolus administration of intravenous contrast. Multiplanar reconstructed images and MIPs were obtained and reviewed to evaluate the vascular anatomy. RADIATION DOSE REDUCTION: This exam was performed according to the departmental dose-optimization program which includes automated exposure control, adjustment of the mA and/or kV according to patient size and/or use of iterative reconstruction technique. CONTRAST:  75mL OMNIPAQUE  IOHEXOL  350 MG/ML SOLN COMPARISON:  September 03, 2023 FINDINGS: Pulmonary Embolism: No pulmonary embolism. Cardiovascular: No cardiomegaly or pericardial effusion. No aortic aneurysm, intramural hematoma, or aortic dissection. CABG changes. Extensive multi-vessel coronary atherosclerosis. Mediastinum/Nodes: No mediastinal mass. No mediastinal, hilar, or axillary lymphadenopathy. Lungs/Pleura: The midline trachea and bronchi are patent. Decreased size of the multiloculated right pleural effusion with multifocal compressive atelectasis in the right lung. Percutaneous pigtail thoracostomy tube in place within the largest pocket along the dependent lower  pleura. Multifocal subsegmental atelectasis also noted in the lingula and left lower lobe. No pneumothorax. Intermediate density material layering within the inferior pleural effusion (axial 33). Multiple small locules of gas  noted in the effusion, likely related to the thoracostomy tube. No extravasation of contrast within the pleural space to suggest active bleeding, at this time. Musculoskeletal: No acute fracture or destructive bone lesion. Osteopenia. Multilevel degenerative disc disease of the spine. Upper Abdomen: No acute abnormality within the partially visualized upper abdomen. Multiple small gallstones again noted. Small volume symmetric bilateral gynecomastia. Review of the MIP images confirms the above findings. IMPRESSION: 1. Interval decrease in size of the multiloculated right pleural effusion with adjacent compressive atelectasis throughout the right lung. Well-positioned pigtail thoracostomy tube. Intermediate density material layering in the inferior aspect of the pleural effusion, which may represent infectious debris or evolving blood products. No extravasation of contrast to suggest active bleeding, at this time. 2. No pneumonia, pulmonary edema, or pneumothorax. 3. Cholecystolithiasis. Aortic Atherosclerosis (ICD10-I70.0). Electronically Signed   By: Rogelia Myers M.D.   On: 09/07/2023 10:19    Patient Profile     73 y.o. male  with a hx of hypertension, hyperlipidemia, carotid artery stenosis s/p endarterectomy in 2018, prior TIA, prior stroke, Crohn's disease, ulcerative colitis, and multivessel CAD s/p CABG 2005 and PCI to SVG to RI in 2008 who is being seen 09/01/2023 for the evaluation of new onset atrial fibrillation at the request of CHRISTOBAL Guadalajara MD.   Assessment & Plan    #New onset atrial fibrillation with RVR - NSR on oral amiodarone  now 200 mg bid - 2D echo -- mild MR, normal biatrial size.   - TSH normal 1.5 - CHADS2VASC score  5 (hypertension, age> 65, history of CVA,  CAD) -no long term oral anticoagulation per Dr Nancey EP due to frequent falls - IV heparin  d/c with chest tube and now 4-5 days post conversion from afib    # Back Pain - Neurosurgery consult appreciated for acute on chronic low back pain with MRI showing mild generalized spondylolysis and DJD.  Plan is for pain management follow-up outpatient    #ASCAD #Hyperlipidemia -s/p remote history of CABG 2005 -s/p  PCI of SVG to RI in 2008  -Denies any recent anginal symptoms prior to having all the issues with back pain but then started having sharp chest pain in the right chest that seem to radiate from the back into the right chest -High-sensitivity troponin is normal (5>>4) -2D echo ok -Continue aspirin  81 mg daily, atorvastatin  80 mg daily    For questions or updates, please contact [facility] Please consult [email or web address] for contact info under        Signed, Maude Emmer, MD  09/09/2023, 8:36 AM

## 2023-09-09 NOTE — Progress Notes (Signed)
 [facility]  Date of Admission:  08/31/2023     Reason for Follow Up: Atrial fibrillation with RVR (HCC)  Total days of antibiotics 7         ASSESSMENT:  [name] is a 73 y/o caucasian male with complicated pleural effusion/right sided empyema with cultures growing MSSA s/p chest tube placement.   [name] blood cultures with no bacteremia. Tolerating antibiotics with no adverse side effects. Chest tube drainage decreasing. Hemoglobin lower today and given blood today. Discussed plan of care to continue with IV Cefazolin  while in the hospital and then transition to oral Augmentin  875/125 mg PO bid. End date of treatment will be set for 10/01/23. Continue chest tube management per Pulmonology. Atrial fibrillation per Cardiology. Continue standard/universal precautions. Has follow up in ID clinic prior to end of treatment. Remaining medical and supportive care per Internal Medicine.   PLAN:  Continue current dose of Cefazolin  with chest tube in place.  Transition to Augmentin  875/125 mg PO bid once chest tube removed until 10/01/23 Chest tube management per Pulmonology. Atrial fibrillation per Cardiology.  Follow up established in ID clinic.  Remaining medical and supportive care per Internal Medicine.   Principal Problem:   Atrial fibrillation with RVR (HCC) Active Problems:   Coronary artery disease due to lipid rich plaque   Essential hypertension, benign   Crohn's colitis (HCC)   Acute on chronic back pain   Abdominal pain   Dizziness   Frequent falls   Acute low back pain    sodium chloride    Intravenous Once   acetaminophen   1,000 mg Oral TID   amiodarone   200 mg Oral BID   aspirin  EC  81 mg Oral Daily   atorvastatin   80 mg Oral Daily   feeding supplement  237 mL Oral BID BM   gabapentin   100 mg Oral Q8H   hydrocortisone  cream   Topical TID   HYDROmorphone    Intravenous Q4H   latanoprost   1 drop Both Eyes QHS   lidocaine    1 patch Transdermal Q24H   And   lidocaine   1 patch Transdermal Q24H   polyethylene glycol  17 g Oral BID   senna-docusate  1 tablet Oral BID   sodium chloride  flush  10 mL Intrapleural Q8H   sodium chloride  flush  3 mL Intravenous Q12H   tamsulosin   0.4 mg Oral QHS    SUBJECTIVE:  Afebrile overnight. Hgb down to 6.8. Tolerating antibiotics with no adverse side effects. Family at bedside.   No Known Allergies   Review of Systems: Review of Systems  Constitutional:  Negative for chills, fever and weight loss.  Respiratory:  Negative for cough, shortness of breath and wheezing.   Cardiovascular:  Negative for chest pain and leg swelling.  Gastrointestinal:  Negative for abdominal pain, constipation, diarrhea, nausea and vomiting.  Skin:  Negative for rash.      OBJECTIVE: Vitals:   09/09/23 0732 09/09/23 0900 09/09/23 1110 09/09/23 1127  BP: (!) 164/85   130/63  Pulse: 77   68  Resp: (!) 27 17 18  (!) 22  Temp: 98.7 F (37.1 C)   98.9 F (37.2 C)  TempSrc: Oral   Oral  SpO2: 97%   98%  Weight:      Height:       Body mass index is 29.33 kg/m.  Physical Exam Constitutional:      General: He is not in acute distress.    Appearance:  He is well-developed.     Interventions: Nasal cannula in place.  Cardiovascular:     Rate and Rhythm: Normal rate and regular rhythm.     Heart sounds: Normal heart sounds.  Pulmonary:     Effort: Pulmonary effort is normal.     Breath sounds: Normal breath sounds.     Comments: Chest tube present and patent Skin:    General: Skin is warm and dry.  Neurological:     Mental Status: He is alert and oriented to person, place, and time.  Psychiatric:        Mood and Affect: Mood normal.     Lab Results Lab Results  Component Value Date   WBC 10.9 (H) 09/09/2023   HGB 8.5 (L) 09/09/2023   HCT 25.3 (L) 09/09/2023   MCV 90.7 09/09/2023   PLT 249 09/09/2023    Lab Results  Component Value Date   CREATININE 1.07 09/09/2023    BUN 13 09/09/2023   NA 137 09/09/2023   K 3.7 09/09/2023   CL 102 09/09/2023   CO2 25 09/09/2023    Lab Results  Component Value Date   ALT 18 09/09/2023   AST 41 09/09/2023   ALKPHOS 70 09/09/2023   BILITOT 0.4 09/09/2023     Microbiology: Recent Results (from the past 240 hours)  Culture, body fluid w Gram Stain-bottle     Status: Abnormal   Collection Time: 09/02/23 12:36 PM   Specimen: Pleura  Result Value Ref Range Status   Specimen Description PLEURAL RIGHT  Final   Special Requests NONE  Final   Gram Stain   Final    GRAM POSITIVE COCCI IN CLUSTERS IN BOTH AEROBIC AND ANAEROBIC BOTTLES CRITICAL RESULT CALLED TO, READ BACK BY AND VERIFIED WITH: RN LONELL MATSU [phone] FCP Performed at [facility], [address], [place], [place] [address]    Culture STAPHYLOCOCCUS AUREUS (A)  Final   Report Status 09/05/2023 FINAL  Final   Organism ID, Bacteria STAPHYLOCOCCUS AUREUS  Final      Susceptibility   Staphylococcus aureus - MIC*    CIPROFLOXACIN <=0.5 SENSITIVE Sensitive     ERYTHROMYCIN <=0.25 SENSITIVE Sensitive     GENTAMICIN <=0.5 SENSITIVE Sensitive     OXACILLIN <=0.25 SENSITIVE Sensitive     TETRACYCLINE <=1 SENSITIVE Sensitive     VANCOMYCIN  1 SENSITIVE Sensitive     TRIMETH/SULFA <=10 SENSITIVE Sensitive     CLINDAMYCIN <=0.25 SENSITIVE Sensitive     RIFAMPIN <=0.5 SENSITIVE Sensitive     Inducible Clindamycin NEGATIVE Sensitive     LINEZOLID 2 SENSITIVE Sensitive     * STAPHYLOCOCCUS AUREUS  Gram stain     Status: None   Collection Time: 09/02/23 12:36 PM   Specimen: Pleura  Result Value Ref Range Status   Specimen Description PLEURAL RIGHT  Final   Special Requests NONE  Final   Gram Stain   Final    RARE WBC PRESENT, PREDOMINANTLY PMN NO ORGANISMS SEEN Performed at [facility], [address] [place], [place] [address]    Report Status 09/02/2023 FINAL  Final  Culture, blood (Routine X 2) w Reflex to ID Panel     Status: None    Collection Time: 09/03/23 10:22 AM   Specimen: BLOOD RIGHT HAND  Result Value Ref Range Status   Specimen Description BLOOD RIGHT HAND  Final   Special Requests   Final    BOTTLES DRAWN AEROBIC AND ANAEROBIC Blood Culture results  may not be optimal due to an inadequate volume of blood received in culture bottles   Culture   Final    NO GROWTH 5 DAYS Performed at [facility], [address], [place], [place] [address]    Report Status 09/08/2023 FINAL  Final  Culture, blood (Routine X 2) w Reflex to ID Panel     Status: None   Collection Time: 09/03/23 10:36 AM   Specimen: BLOOD LEFT HAND  Result Value Ref Range Status   Specimen Description BLOOD LEFT HAND  Final   Special Requests   Final    BOTTLES DRAWN AEROBIC ONLY Blood Culture adequate volume   Culture   Final    NO GROWTH 5 DAYS Performed at [facility], [address] [place], [place] [address]    Report Status 09/08/2023 FINAL  Final  MRSA Next Gen by PCR, Nasal     Status: None   Collection Time: 09/03/23  2:22 PM   Specimen: Nasal Mucosa; Nasal Swab  Result Value Ref Range Status   MRSA by PCR Next Gen NOT DETECTED NOT DETECTED Final    Comment: (NOTE) The GeneXpert MRSA Assay (FDA approved for NASAL specimens only), is one component of a comprehensive MRSA colonization surveillance program. It is not intended to diagnose MRSA infection nor to guide or monitor treatment for MRSA infections. Test performance is not FDA approved in patients less than 28 years old. Performed at [facility], [address] [place], [place] [address]   SARS Coronavirus 2 by RT PCR (hospital order, performed in [facility] hospital lab) *cepheid single result test* Anterior Nasal Swab     Status: None   Collection Time: 09/08/23 12:34 PM   Specimen: Anterior Nasal Swab  Result Value Ref Range Status   SARS Coronavirus 2 by RT PCR NEGATIVE NEGATIVE Final    Comment: Performed at [facility], [address] [place], [place] [address]   I have personally spent 28 minutes involved in face-to-face and non-face-to-face activities for this patient on the day of the visit. Professional time spent includes the following activities: preparing to see the patient (review of tests), performing a medically appropriate examination, ordering medications, communicating with other health care professionals, documenting clinical information in the EMR, communicating results and counseling patient and family regarding medication and plan of care, and care coordination.    Greg Robben Jagiello, NP [facility]  09/09/2023  11:44 AM

## 2023-09-09 NOTE — Progress Notes (Signed)
 NAME:  Jacob [name], MRN:  [id number], DOB:  12/21/1950, LOS: 9 ADMISSION DATE:  08/31/2023, CONSULTATION DATE: 09/09/23  REFERRING MD:  Shlomo, CHIEF COMPLAINT:  back pain   History of Present Illness:  73 year old man w/ CAD, previously functional presenting with 1 month of worsening back pain and multiple falls.  Workup to date neg.  This time coming in with associated afib/RVR so admitted.  Imaging revealed R pleural effusion and this was drained yesterday.  This am began having R pleurisy associated with ongoing back pain and increasing dyspnea.  PCCM consulted for admission due to ongoing worsening dyspnea.    Pertinent  Medical History   Past Medical History:  Diagnosis Date   CAD (coronary artery disease), native coronary artery     multivessel ASCAD s/p CABG and s/p PCI of SVG to RI 2008   Carotid artery stenosis    patent right CEA site and 1-39% Lcarotid stenosis   Crohn's disease (HCC)    flare 2000   Depression    Dyslipidemia    Dyspnea    ED (erectile dysfunction)    History of blood transfusion 2005   Hypertension    Myocardial infarction [facility]) 2005   5 bypasses   Nephrolithiasis    Stroke (HCC)    TIA- Endartarectemy R side   TIA (transient ischemic attack)    Ulcerative colitis (HCC)    Left sided     Significant Hospital Events: Including procedures, antibiotic start and stop dates in addition to other pertinent events   8/14 admit 8/15 small bore chest tube placed, first dose of pleural lytics instilled 8/16 second dose of pleural lytics instilled 8/17 Am CXR pending, it appears Dwane was changed overnight  8/19 More bloody output. Checking Hb after transfusion of 2units. CTA no extravasation   Interim History / Subjective:  Patient reports pleuritic chest pain simmilar to before.  Objective    Blood pressure 130/63, pulse 68, temperature 98.9 F (37.2 C), temperature source Oral, resp. rate (!) 22, height 5' 11 (1.803 m), weight 95.4 kg,  SpO2 98%.        Intake/Output Summary (Last 24 hours) at 09/09/2023 1409 Last data filed at 09/09/2023 1134 Gross per 24 hour  Intake 1869 ml  Output 356 ml  Net 1513 ml   Filed Weights   08/31/23 1830 09/01/23 1118  Weight: 91.6 kg 95.4 kg    Examination: General: ill appearing male, no distress  HEENT: [place]/ATT, MM pink/moist, PERRL,  Neuro: alert and oriented in good spirits  CV: RRR, Nl s1,s2  PULM:  Chest tube removed, sight erythema and redness   GI: soft, bowel sounds active in all 4 quadrants, non-tender, non-distended, tolerating oral deit Extremities: warm/dry, no edema  Skin: no rashes or lesions  Resolved problem list   Assessment and Plan  Acute hypoxic respiratory failure likely due to pulmonary splinting and pleural effusion Right empyema with pan sensitive Staphylococcus Aureus seen on cultures Right sided pleuritic chest pain Concern for bleeding and hemothorax. Dropped his Hb overnight and had more bloody output. No extravasation on CT. CT shows improved pocket with good drainage with lytics.  No further output in the past 24 hours. No change in pocket size suggestive of no further bleeding.   P: - No AC long term due to falls which will be helpful over the next few days to make sure he has no hemothorax  -  Pigtail catheter removed today without issues  - Based  on CT chest findings, pocket much improved in size. Will need to follow up with ID regarding antibiotics  Pulmonary will sign off     I spent 30 minutes caring for this patient today, including preparing to see the patient, obtaining a medical history , reviewing a separately obtained history, performing a medically appropriate examination and/or evaluation, counseling and educating the patient/family/caregiver, ordering medications, tests, or procedures, referring and communicating with other health care professionals (not separately reported), documenting clinical information in the electronic health  record, independently interpreting results (not separately reported/billed) and communicating results to the patient/family/caregiver, and care coordination (not separately reported/billed)    Zola Herter, MD [facility] Office: [phone]

## 2023-09-09 NOTE — Progress Notes (Signed)
 Physical Therapy Treatment Patient Details Name: Jacob [name] MRN: [id number] DOB: Jun 16, 1950 Today's Date: 09/09/2023   History of Present Illness 73 y.o. male admitted 8/12 with A-fib with RVR and severe back pain. MRI shows mild generalized spondylosis, L5-S1 facet arthropathy, and L3-4 degenerative disease. 8/15 Rt pleural effusion s/p pigtail catheter. PMH: Crohn's disease, CAD s/p CABG, CVA, carotid artery stenosis c/p CEA, HTN, HLD    PT Comments  Pt is progressing towards goals. Currently pt is supervision for bed mobility, CGA for sit to stand and gait with RW. Pt gait with very short steps with minimal heel toe gait pattern; improved significantly with verbal cues for upright posture and larger steps that would continue for ~ 20 ft and then need follow up reminder. Due to pt current functional status, home set up and available assistance at home recommending skilled physical therapy services 3x/week in order to address strength, balance and functional mobility to decrease risk for falls, injury and re-hospitalization.       If plan is discharge home, recommend the following: A little help with walking and/or transfers;Assistance with cooking/housework;Assist for transportation;Help with stairs or ramp for entrance   Can travel by private vehicle     Yes  Equipment Recommendations  None recommended by PT       Precautions / Restrictions Precautions Precautions: Fall Recall of Precautions/Restrictions: Intact Restrictions Weight Bearing Restrictions Per Provider Order: No     Mobility  Bed Mobility Overal bed mobility: Needs Assistance Bed Mobility: Supine to Sit, Sit to Supine     Supine to sit: Supervision Sit to supine: Supervision   General bed mobility comments: Supervision for safety    Transfers Overall transfer level: Needs assistance Equipment used: Rolling walker (2 wheels) Transfers: Sit to/from Stand Sit to Stand: Contact guard assist            General transfer comment: CGA from bed height    Ambulation/Gait Ambulation/Gait assistance: Contact guard assist Gait Distance (Feet): 350 Feet Assistive device: Rolling walker (2 wheels) Gait Pattern/deviations: Step-through pattern, Decreased stride length, Trunk flexed Gait velocity: decreased Gait velocity interpretation: 1.31 - 2.62 ft/sec, indicative of limited community ambulator   General Gait Details: Pt with verbal cues for stepping and AD navigation intermittently. Cues for large steps and upright posture, Improved intermittently. Better O2 sats and gait sequencing following cues      Balance Overall balance assessment: Needs assistance Sitting-balance support: No upper extremity supported, Feet supported Sitting balance-Leahy Scale: Good Sitting balance - Comments: EOB without support   Standing balance support: Bilateral upper extremity supported, Reliant on assistive device for balance, During functional activity Standing balance-Leahy Scale: Fair Standing balance comment: RW in standing      Communication Communication Communication: No apparent difficulties  Cognition Arousal: Alert Behavior During Therapy: Flat affect   PT - Cognitive impairments: Problem solving       PT - Cognition Comments: continues with some slow processing Following commands: Intact      Cueing Cueing Techniques: Verbal cues     General Comments General comments (skin integrity, edema, etc.): O2 sats 94% during gait on room air      Pertinent Vitals/Pain Pain Assessment Pain Score: 3  Pain Descriptors / Indicators: Sore Pain Intervention(s): Monitored during session     PT Goals (current goals can now be found in the care plan section) Acute Rehab PT Goals Patient Stated Goal: Reduce back pain. PT Goal Formulation: With patient/family Time For Goal Achievement: 09/16/23 Potential to  Achieve Goals: Good Progress towards PT goals: Progressing toward goals     Frequency    Min 2X/week      PT Plan  Continue with current POC        AM-PAC PT 6 Clicks Mobility   Outcome Measure  Help needed turning from your back to your side while in a flat bed without using bedrails?: A Little Help needed moving from lying on your back to sitting on the side of a flat bed without using bedrails?: A Little Help needed moving to and from a bed to a chair (including a wheelchair)?: A Little Help needed standing up from a chair using your arms (e.g., wheelchair or bedside chair)?: A Little Help needed to walk in hospital room?: A Little Help needed climbing 3-5 steps with a railing? : A Little 6 Click Score: 18    End of Session Equipment Utilized During Treatment: Gait belt Activity Tolerance: Patient tolerated treatment well Patient left: in bed;with call bell/phone within reach;with family/visitor present Nurse Communication: Mobility status PT Visit Diagnosis: Other abnormalities of gait and mobility (R26.89);Muscle weakness (generalized) (M62.81);History of falling (Z91.81);Difficulty in walking, not elsewhere classified (R26.2);Pain     Time: 8591-8566 PT Time Calculation (min) (ACUTE ONLY): 25 min  Charges:    $Gait Training: 8-22 mins $Therapeutic Activity: 8-22 mins PT General Charges $$ ACUTE PT VISIT: 1 Visit                     Dorothyann Maier, DPT, CLT  Acute Rehabilitation Services Office: [phone] (Secure chat preferred)    Dorothyann VEAR Maier 09/09/2023, 3:54 PM

## 2023-09-09 NOTE — Progress Notes (Signed)
 Dilaudid  PCA discontinued. 9ml Dilaudid  wasted with Harlene Waddell PEAK.

## 2023-09-09 NOTE — Progress Notes (Signed)
  Progress Note   Patient: Jacob [name] FMW:[id number] DOB: 01-07-1951 DOA: 08/31/2023     9 DOS: the patient was seen and examined on 09/09/2023 at 8:05AM      Brief hospital course: 73 y.o. M with HTN, Crohn's disease not on disease specific therapy, CAD s/p CABG 2008, hx TIA, hx carotid disease s/p R CEA 2018 who presented with empyema   Assessment and Plan: MSSA empyema - Continue cefazolin  - Chest tube out today -Transition off PCA pump  Hemothorax Acute blood loss anemia Hemoglobin down to 6.8 again today Posttransfusion H&H bumped up appropriately - Hold anticoagulation - Transfuse hemoglobin again now  Atrial fibrillation with RVR, new onset -Hold anticoagulation - Continue amiodarone   Coronary artery disease Hypertension - Continue aspirin , Lipitor          Subjective: Patient's pain is stable, his breathing is okay, he thinks pulmonology will plan to take the tube out     Physical Exam: BP 130/63 (BP Location: Left Arm)   Pulse 68   Temp 98.9 F (37.2 C) (Oral)   Resp (!) 22   Ht 5' 11 (1.803 m)   Wt 95.4 kg   SpO2 98%   BMI 29.33 kg/m   Adult male, lying in bed, interactive and appropriate RRR, no peripheral edema Respiratory rate seems normal, lung sounds diminished overall, no rales or wheezes appreciated Abdomen soft, no tenderness palpation or guarding Attention normal, affect blunted, judgment insight appear normal    Data Reviewed: Basic metabolic panel shows normal electrolytes and renal function CBC shows hemoglobin down to 6.8, white count improving Posttransfusion H&H up to 8.5    Family Communication: Son at the bedside    Disposition: Status is: Inpatient         Author: Lonni SHAUNNA Dalton, MD 09/09/2023 2:33 PM  For on call review [email or web address].

## 2023-09-10 ENCOUNTER — Telehealth: Payer: Self-pay | Admitting: Acute Care

## 2023-09-10 ENCOUNTER — Other Ambulatory Visit (HOSPITAL_COMMUNITY): Payer: Self-pay

## 2023-09-10 DIAGNOSIS — I4891 Unspecified atrial fibrillation: Secondary | ICD-10-CM | POA: Diagnosis not present

## 2023-09-10 LAB — TYPE AND SCREEN
ABO/RH(D): O POS
Antibody Screen: NEGATIVE
Unit division: 0
Unit division: 0
Unit division: 0

## 2023-09-10 LAB — BPAM RBC
Blood Product Expiration Date: 202508262359
Blood Product Expiration Date: 202509092359
Blood Product Expiration Date: 202509092359
ISSUE DATE / TIME: 202508210439
ISSUE DATE / TIME: 202508191027
ISSUE DATE / TIME: 202508191428
Unit Type and Rh: 9500
Unit Type and Rh: 5100
Unit Type and Rh: 5100

## 2023-09-10 LAB — CBC
HCT: 24.4 % — ABNORMAL LOW (ref 39.0–52.0)
Hemoglobin: 8.3 g/dL — ABNORMAL LOW (ref 13.0–17.0)
MCH: 30.7 pg (ref 26.0–34.0)
MCHC: 34 g/dL (ref 30.0–36.0)
MCV: 90.4 fL (ref 80.0–100.0)
Platelets: 306 K/uL (ref 150–400)
RBC: 2.7 MIL/uL — ABNORMAL LOW (ref 4.22–5.81)
RDW: 14.4 % (ref 11.5–15.5)
WBC: 11.3 K/uL — ABNORMAL HIGH (ref 4.0–10.5)
nRBC: 0 % (ref 0.0–0.2)

## 2023-09-10 MED ORDER — AMIODARONE HCL 200 MG PO TABS
200.0000 mg | ORAL_TABLET | Freq: Two times a day (BID) | ORAL | 0 refills | Status: DC
Start: 2023-09-10 — End: 2023-10-11
  Filled 2023-09-10: qty 90, 45d supply, fill #0

## 2023-09-10 MED ORDER — AMOXICILLIN-POT CLAVULANATE 875-125 MG PO TABS
1.0000 | ORAL_TABLET | Freq: Two times a day (BID) | ORAL | 0 refills | Status: AC
  Filled 2023-09-10: qty 48, 24d supply, fill #0

## 2023-09-10 MED ORDER — AMOXICILLIN-POT CLAVULANATE 875-125 MG PO TABS
1.0000 | ORAL_TABLET | Freq: Two times a day (BID) | ORAL | 0 refills | Status: DC
  Filled 2023-09-10: qty 42, 21d supply, fill #0

## 2023-09-10 NOTE — Progress Notes (Signed)
 Physical Therapy Treatment Patient Details Name: Jacob [name] MRN: [id number] DOB: 04-29-1950 Today's Date: 09/10/2023   History of Present Illness 73 y.o. male admitted 8/12 with A-fib with RVR and severe back pain. MRI shows mild generalized spondylosis, L5-S1 facet arthropathy, and L3-4 degenerative disease. 8/15 Rt pleural effusion s/p pigtail catheter. PMH: Crohn's disease, CAD s/p CABG, CVA, carotid artery stenosis c/p CEA, HTN, HLD    PT Comments  Significant progress towards functional goals. Pt mobilizing at a level family can safely assist. Encouraged RW use. Reviewed stair navigation today at CGA level, instructed family on guarding techniques (behind pt when ascending and in front when descending.) Ambulates >150 feet on RA SpO2 94% and greater, no episodes of LOB. Benefit from HHPT follow up. Patient will continue to benefit from skilled physical therapy services to further improve independence with functional mobility.     If plan is discharge home, recommend the following: A little help with walking and/or transfers;Assistance with cooking/housework;Assist for transportation;Help with stairs or ramp for entrance   Can travel by private vehicle     Yes  Equipment Recommendations  None recommended by PT    Recommendations for Other Services       Precautions / Restrictions Precautions Precautions: Fall Recall of Precautions/Restrictions: Intact Precaution/Restrictions Comments: educated on back precautions for comfort Restrictions Weight Bearing Restrictions Per Provider Order: No     Mobility  Bed Mobility Overal bed mobility: Modified Independent Bed Mobility: Sit to Supine       Sit to supine: Modified independent (Device/Increase time)   General bed mobility comments: Reviewed log roll technique. pt perfered to spin and lift legs. Performed without increased pain and no assist.    Transfers Overall transfer level: Needs assistance Equipment used:  Rolling walker (2 wheels) Transfers: Sit to/from Stand Sit to Stand: Supervision           General transfer comment: Supervision for safety. Good recall of hand placement. Slow effortful rise but no assist.    Ambulation/Gait Ambulation/Gait assistance: Supervision Gait Distance (Feet): 180 Feet Assistive device: Rolling walker (2 wheels) Gait Pattern/deviations: Step-through pattern, Decreased stride length, Trunk flexed Gait velocity: decreased Gait velocity interpretation: <1.31 ft/sec, indicative of household ambulator   General Gait Details: Cues for upright posture and to keep feet apart when turning. Good recall of placement within RW for maximum support. No buckling noted. Spo2 94% and greater on Room air. Modified BORG of 6/10. Educated on awareness and pacing, rest as needed.  Supervision for safety only.   Stairs Stairs: Yes Stairs assistance: Contact guard assist Stair Management: One rail Right, Step to pattern, Sideways Number of Stairs: 1 (x4) General stair comments: Simulated home set-up in room with single step and support from rail on Rt. Practiced forward with step-to pattern, and sideways with both hands on rail. Educated family on guarding techniques. CGA for safety, no physical assist needed. Pt reports confidence with task.   Wheelchair Mobility     Tilt Bed    Modified Rankin (Stroke Patients Only)       Balance Overall balance assessment: Needs assistance Sitting-balance support: No upper extremity supported, Feet supported Sitting balance-Leahy Scale: Good     Standing balance support: Reliant on assistive device for balance, During functional activity, No upper extremity supported Standing balance-Leahy Scale: Fair Standing balance comment: Benefits from single UE support in standing  Communication Communication Communication: No apparent difficulties  Cognition Arousal: Alert Behavior During  Therapy: WFL for tasks assessed/performed   PT - Cognitive impairments: Problem solving, Awareness, Sequencing                       PT - Cognition Comments: continues with some slow processing Following commands: Intact      Cueing Cueing Techniques: Verbal cues  Exercises      General Comments General comments (skin integrity, edema, etc.): Spo2 94% on RA, HR 76      Pertinent Vitals/Pain Pain Assessment Pain Assessment: 0-10 Pain Score: 6  Pain Location: back Pain Descriptors / Indicators: Sore Pain Intervention(s): Monitored during session    Home Living                          Prior Function            PT Goals (current goals can now be found in the care plan section) Acute Rehab PT Goals Patient Stated Goal: Reduce back pain. PT Goal Formulation: With patient/family Time For Goal Achievement: 09/16/23 Potential to Achieve Goals: Good Progress towards PT goals: Progressing toward goals    Frequency    Min 2X/week      PT Plan      Co-evaluation              AM-PAC PT 6 Clicks Mobility   Outcome Measure  Help needed turning from your back to your side while in a flat bed without using bedrails?: A Little Help needed moving from lying on your back to sitting on the side of a flat bed without using bedrails?: A Little Help needed moving to and from a bed to a chair (including a wheelchair)?: A Little Help needed standing up from a chair using your arms (e.g., wheelchair or bedside chair)?: A Little Help needed to walk in hospital room?: A Little Help needed climbing 3-5 steps with a railing? : A Little 6 Click Score: 18    End of Session Equipment Utilized During Treatment: Gait belt Activity Tolerance: Patient tolerated treatment well Patient left: in bed;with call bell/phone within reach;with bed alarm set;with family/visitor present;with SCD's reapplied Nurse Communication: Mobility status PT Visit Diagnosis: Other  abnormalities of gait and mobility (R26.89);Muscle weakness (generalized) (M62.81);History of falling (Z91.81);Difficulty in walking, not elsewhere classified (R26.2);Pain Pain - part of body:  (back, Rt flank.)     Time: 9065-9047 PT Time Calculation (min) (ACUTE ONLY): 18 min  Charges:    $Gait Training: 8-22 mins PT General Charges $$ ACUTE PT VISIT: 1 Visit                     Leontine Roads, PT, DPT [facility]  Rehabilitation Services Physical Therapist Office: [phone] Website: [email or web address]     Leontine GORMAN Roads 09/10/2023, 10:40 AM

## 2023-09-10 NOTE — TOC Progression Note (Signed)
 Transition of Care [facility]) - Progression Note    Patient Details  Name: CURRIE DENNIN MRN: [id number] Date of Birth: 02-12-1950  Transition of Care [facility]) CM/SW Contact  Stephane Powell Jansky, RN Phone Number: 09/10/2023, 12:12 PM  Clinical Narrative:     Patient from home with wife.   Orders for HHPT/OT , both in agreement, no preference. Burnard with [facility] accepted  Patient has walker at home already   Expected Discharge Plan: Home w Home Health Services Barriers to Discharge: No Barriers Identified               Expected Discharge Plan and Services In-house Referral: NA Discharge Planning Services: CM Consult Post Acute Care Choice: Home Health Living arrangements for the past 2 months: Single Family Home Expected Discharge Date: 09/10/23               DME Arranged: N/A         HH Arranged: PT, OT HH Agency: [facility] Date HH Agency Contacted: 09/10/23 Time HH Agency Contacted: 1211 Representative spoke with at [facility] Agency: Burnard   Social Drivers of Health (SDOH) Interventions SDOH Screenings   Food Insecurity: Food Insecurity Present (09/01/2023)  Housing: Low Risk  (09/01/2023)  Transportation Needs: No Transportation Needs (09/01/2023)  Utilities: Not At Risk (09/01/2023)  Social Connections: Socially Integrated (09/01/2023)  Tobacco Use: High Risk (08/31/2023)    Readmission Risk Interventions     No data to display

## 2023-09-10 NOTE — Telephone Encounter (Signed)
 Called PT no answer unable to leave VM

## 2023-09-10 NOTE — Discharge Summary (Signed)
 Physician Discharge Summary   Patient: Jacob [name] MRN: [id number] DOB: 1950-10-24  Admit date:     08/31/2023  Discharge date: 09/10/23  Discharge Physician: Lonni SHAUNNA Dalton   PCP: [name], MD     Recommendations at discharge:  Follow-up with pulmonology for MSSA empyema in 1 to 2 weeks, referral sent Follow-up with infectious disease, Dr. Luiz on Sep 15 for empyema Follow-up with PCP for anemia, hypertension Follow-up with cardiology for new atrial fibrillation not on anticoagulation  Dr. Stephanie: Please check CBC, BMP, iron studies and CXR in 1 week (discharge Hgb 8.3, Cr 1.07)  Return to sinus rhythm at discharge; Defer decisions re: prolonged cardiac monitoring and need for anticoagulation to AFib clinic        Discharge Diagnoses: Principal Problem:   MSSA empyema Active Problems:   New onset Atrial fibrillation with RVR (HCC)   Hemothorax   Acute blood loss anemia   Acute kidney injury   Coronary artery disease   Hypertension   Crohn's disease   Chronic back pain   Essential hypertension, benign     Hospital Course: 73 y.o. M with HTN, Crohn's disease not on disease specific therapy, CAD s/p CABG 2008, hx TIA, hx carotid disease s/p R CEA 2018 who presented with subacute weakness, falls, dizziness and worsening back pain.  Seen three times in ER and third time was in Afib with RVR and so admitted.  CT on admission 8/12 showed pleural effusion.  Cardiology consulted for Afib, Neurosurgery consulted for back pain.  Afib controlled, NSGY recommended outpatient follow up.  Subsequently underwent thoracentesis 8/14 that showed empyema, pleural fluid culture growing MSSA.  Chest tube placed 8/15.  Lytics given 8/15, 8/16, 8/17 and 8/18 then 8/18 overnight started to have larger volume dark red drainage from chest tube.  Placed to water  seal, IV heparin  stopped, Hgb down to 6.6, transfused 2u PRBCs on 8/19. CT surgery  consulted.       MSSA empyema The patient was admitted initially mostly for atrial fibrillation with RVR and noted incidentally to have pleural effusion.   Given ongoing symptoms, this was sent for thoracentesis and found to be purulent with GPC's on Gram stain.  Subsequent analysis showed that this was clearly an empyema, chest tube was placed, and pulmonology were consulted.  ID were consulted, and after resolution of #2 below, his chest tube was removed, his symptoms were considerably improved, ID recommended 3 more weeks of oral antibiotics with Augmentin , and he was discharged with ID and pulmonology follow-up.     Hemothorax Acute blood loss anemia After lytic instillation #4, patient developed grossly bloody fluid in his chest tube canister.  Hemoglobin dropped to 6, and he required transfusion of 3 units packed red blood cells.  Heparin  infusion (for A-fib) was stopped.  In the last 72 hours, his hemoglobin stabilized, he has not required further transfusions, Hgb stable today.  Recommend close follow-up with PCP.  Okay to continue aspirin .    Atrial fibrillation with RVR, new onset New onset.  CHA2DS2-Vasc 5 (age, HTN, stroke, vascular disease).  Initially was on heparin , but this was held due to hemothorax.  TSH normal, echo unremarkable.  He was treated with amiodarone , and reverted to sinus rhythm.  At the time of discharge, given his hemothorax, anticoagulation was held.  He was discharged on amiodarone .  Hemothorax in the setting of repeated pleural fibrinolytics would not be a long-term contraindication to anticoagulation if he indeed has recurrent Afib.  Acute kidney injury Resolved to baseline.   Coronary disease Hypertension BP low normal at discharge.  Amlodipine  and lisinopril  held until PCP follow up   Crohn's disease Quiescent many yaers, not on disease specific therapy   Chronic back pain There was some interpretation of his presenting complaint  of back pain to his spine.  However given complete resolution with treatment of his empyema and removal of the chest tube, it seems as if this was likely misinterpretation.  MRI here showed fairly typical degenerative disease of the spine, unlikely to require significant pain management or neurosurgery follow-up, but this could be arranged if lumbar back pain returns.            [place] Sub[place] was reviewed for this patient prior to discharge.  Consultants: Pulmonology Neurosurgery Cardiology Infectious disease Cardiothoracic surgery  Procedures performed: Chest tube placement, pleural fibrinolytics, chest tube removal Disposition: Home health Diet recommendation:  Discharge Diet Orders (From admission, onward)     Start     Ordered   09/10/23 0000  Diet - low sodium heart healthy        09/10/23 1132             DISCHARGE MEDICATION: Allergies as of 09/10/2023   No Known Allergies      Medication List     PAUSE taking these medications    amLODipine  5 MG tablet Wait to take this until your doctor or other care provider tells you to start again. Commonly known as: NORVASC  Take 1 tablet (5 mg total) by mouth daily.   lisinopril  20 MG tablet Wait to take this until your doctor or other care provider tells you to start again. Commonly known as: ZESTRIL  Take 1 tablet (20 mg total) by mouth daily.       STOP taking these medications    metoprolol  succinate 25 MG 24 hr tablet Commonly known as: TOPROL -XL   nitroGLYCERIN  0.4 MG SL tablet Commonly known as: NITROSTAT    predniSONE 10 MG (21) Tbpk tablet Commonly known as: STERAPRED UNI-PAK 21 TAB       TAKE these medications    amiodarone  200 MG tablet Commonly known as: PACERONE  Take 1 tablet (200 mg total) by mouth 2 (two) times daily.   amoxicillin -clavulanate 875-125 MG tablet Commonly known as: AUGMENTIN  Take 1 tablet by mouth 2 (two) times daily for 24  days.   aspirin  EC 81 MG tablet Take 81 mg by mouth daily. Swallow whole.   atorvastatin  80 MG tablet Commonly known as: LIPITOR Take 1 tablet (80 mg total) by mouth daily.   cyanocobalamin  500 MCG tablet Commonly known as: VITAMIN B12 Take 1,000 mcg by mouth daily.   glucosamine-chondroitin 500-400 MG tablet Take 1 tablet by mouth 2 (two) times daily.   HYDROcodone -acetaminophen  5-325 MG tablet Commonly known as: NORCO/VICODIN Take 1 tablet by mouth every 8 (eight) hours as needed for severe pain (pain score 7-10).   latanoprost  0.005 % ophthalmic solution Commonly known as: XALATAN  Place 1 drop into both eyes at bedtime.   Salonpas Pain Relieving 4 % Generic drug: lidocaine  Place 1 patch onto the skin daily.   sildenafil 100 MG tablet Commonly known as: VIAGRA Take 100 mg by mouth daily as needed for erectile dysfunction.   tamsulosin  0.4 MG Caps capsule Commonly known as: FLOMAX  Take 0.4 mg by mouth at bedtime.   tiZANidine  4 MG tablet Commonly known as: Zanaflex  Take 1 tablet (4 mg total) by mouth 3 (three)  times daily.        Follow-up Information     Hamrick, Charlene CROME, MD. Schedule an appointment as soon as possible for a visit in 1 week(s).   Specialty: Family Medicine Contact information: [address] [place] [address] [phone]         Luiz Channel, MD Follow up.   Specialty: Infectious Diseases Contact information: [address] [place] [address] [phone]         [facility] .   Specialty: Pulmonology Contact information: [address] [place]  [address] [phone] Additional information: [address]  [address]  [place], [place] [address]        [facility] Follow up.   Contact information: [address] [place]  [address] [phone]         [facility] Follow up.   Specialty: Home Health Services Contact information: [address] [place] [address] [place] [address] [phone]                 Discharge Instructions     Amb referral to AFIB Clinic   Complete by: As directed    Diet - low sodium heart healthy   Complete by: As directed    Discharge instructions   Complete by: As directed    **IMPORTANT DISCHARGE INSTRUCTIONS**   From Dr. Jonel: You were admitted for an empyema This is a bacterial infection in the lining around the lung  You had a chest tube to drain it, and antibiotics  Complete 3 more weeks of antibiotics, ending Sep 15 Go see the Infectious Disease specialist on Sep 15 Dr. Luiz  Call the Pulmonologist office [facility], see below) to arrange a lung specialist follow up in 3-4 weeks  Go see your primary doctor in 1 week Have her check your blood level (hemoglobin) your kidney function and a chest x-ray  Ask her if you should resume amlodipine  and/or lisinopril     You also had a new heart abnormal rhythm while you were here  STOP metoprolol  for heart slowing START amiodarone  for regulating heart rhythm  Go see the heart doctors within 1 month (we have sent a referral, they should call you, if not, call the numbers below under HeartCare)   Increase activity slowly   Complete by: As directed    No wound care   Complete by: As directed        Discharge Exam: Filed Weights   08/31/23 1830 09/01/23 1118  Weight: 91.6 kg 95.4 kg    General: Pt is alert, awake, not in acute distress Cardiovascular: RRR, nl S1-S2, no murmurs appreciated.   No LE edema.   Respiratory: Normal respiratory rate and rhythm.  CTAB without rales or wheezes. Abdominal: Abdomen soft and non-tender.  No distension or HSM.   Neuro/Psych: Strength symmetric in upper and lower extremities.  Judgment and insight appear normal.   Condition at discharge: good  The results of  significant diagnostics from this hospitalization (including imaging, microbiology, ancillary and laboratory) are listed below for reference.   Imaging Studies: DG CHEST PORT 1 VIEW Result Date: 09/09/2023 CLINICAL DATA:  [id number] Empyema [facility]) [id number] EXAM: PORTABLE CHEST 1 VIEW COMPARISON:  09/08/2023. FINDINGS: Right basilar pigtail pleural catheter with similar acute angle projecting over the right hemidiaphragm. Loculated right pleural effusion with right basilar opacities, not significantly  changed. No pneumothorax identified. Stable cardiomediastinal contours. Prior median sternotomy and CABG. IMPRESSION: 1. Right basilar pigtail pleural catheter with similar acute angle projecting over the right hemidiaphragm. 2. Loculated right pleural effusion with right basilar opacities, not significantly changed. Electronically Signed   By: Harrietta Sherry M.D.   On: 09/09/2023 10:04   DG CHEST PORT 1 VIEW Result Date: 09/08/2023 CLINICAL DATA:  [id number] Empyema [facility]) [id number] EXAM: PORTABLE CHEST 1 VIEW COMPARISON:  CT chest dated 09/07/2023. Chest radiograph dated 09/06/2023. FINDINGS: Right pigtail pleural catheter in similar position. No significant change in multiloculated right pleural effusion with right basilar opacities. The left lung is clear. No pneumothorax. Stable cardiomediastinal contours. Prior median sternotomy and CABG. Visualized osseous structures are unchanged. IMPRESSION: Right pigtail pleural catheter in similar position. No significant change in multiloculated right pleural effusion with right basilar opacities. Electronically Signed   By: Harrietta Sherry M.D.   On: 09/08/2023 09:09   CT ANGIO CHEST AORTA W/CM & OR WO/CM Result Date: 09/07/2023 CLINICAL DATA:  Pleural effusion, concern for hemothorax post TPA/DNASE for empyema EXAM: CT ANGIOGRAPHY CHEST WITH AND WITHOUT CONTRAST TECHNIQUE: Non-contrast CT of the chest was initially obtained. Multidetector CT imaging through the chest was  performed using the standard protocol during bolus administration of intravenous contrast. Multiplanar reconstructed images and MIPs were obtained and reviewed to evaluate the vascular anatomy. RADIATION DOSE REDUCTION: This exam was performed according to the departmental dose-optimization program which includes automated exposure control, adjustment of the mA and/or kV according to patient size and/or use of iterative reconstruction technique. CONTRAST:  75mL OMNIPAQUE  IOHEXOL  350 MG/ML SOLN COMPARISON:  September 03, 2023 FINDINGS: Pulmonary Embolism: No pulmonary embolism. Cardiovascular: No cardiomegaly or pericardial effusion. No aortic aneurysm, intramural hematoma, or aortic dissection. CABG changes. Extensive multi-vessel coronary atherosclerosis. Mediastinum/Nodes: No mediastinal mass. No mediastinal, hilar, or axillary lymphadenopathy. Lungs/Pleura: The midline trachea and bronchi are patent. Decreased size of the multiloculated right pleural effusion with multifocal compressive atelectasis in the right lung. Percutaneous pigtail thoracostomy tube in place within the largest pocket along the dependent lower pleura. Multifocal subsegmental atelectasis also noted in the lingula and left lower lobe. No pneumothorax. Intermediate density material layering within the inferior pleural effusion (axial 33). Multiple small locules of gas noted in the effusion, likely related to the thoracostomy tube. No extravasation of contrast within the pleural space to suggest active bleeding, at this time. Musculoskeletal: No acute fracture or destructive bone lesion. Osteopenia. Multilevel degenerative disc disease of the spine. Upper Abdomen: No acute abnormality within the partially visualized upper abdomen. Multiple small gallstones again noted. Small volume symmetric bilateral gynecomastia. Review of the MIP images confirms the above findings. IMPRESSION: 1. Interval decrease in size of the multiloculated right pleural  effusion with adjacent compressive atelectasis throughout the right lung. Well-positioned pigtail thoracostomy tube. Intermediate density material layering in the inferior aspect of the pleural effusion, which may represent infectious debris or evolving blood products. No extravasation of contrast to suggest active bleeding, at this time. 2. No pneumonia, pulmonary edema, or pneumothorax. 3. Cholecystolithiasis. Aortic Atherosclerosis (ICD10-I70.0). Electronically Signed   By: Rogelia Myers M.D.   On: 09/07/2023 10:19   DG CHEST PORT 1 VIEW Result Date: 09/06/2023 CLINICAL DATA:  Pleural effusion EXAM: PORTABLE CHEST 1 VIEW COMPARISON:  09/06/2023 FINDINGS: Right basilar pigtail pleural catheter in unchanged position with similar acute angle projecting over the right hemidiaphragm. Right pleural effusion and associated airspace opacities in the right lung. Low lung volumes accentuate pulmonary vascularity and  cardiomediastinal silhouette. No pneumothorax. Sternotomy and CABG. Gas-filled loops of colon in the upper abdomen. IMPRESSION: 1. Right basilar pigtail pleural catheter in unchanged position with acute angle projecting over the diaphragm. 2. Right pleural effusion and associated airspace opacities in the right lung are unchanged. Electronically Signed   By: Norman Gatlin M.D.   On: 09/06/2023 22:57   DG CHEST PORT 1 VIEW Result Date: 09/06/2023 CLINICAL DATA:  Empyema. EXAM: PORTABLE CHEST 1 VIEW COMPARISON:  Radiograph yesterday FINDINGS: Stable positioning of right basilar pigtail catheter. The tubing is noted to be looped or a kinked projecting over the mid aspect of the hemithorax. Grossly unchanged right basilar opacity and pleural thickening/effusion. No evidence of pneumothorax. Overall low lung volumes are unchanged. Prior median sternotomy with stable heart size. IMPRESSION: 1. Stable positioning of right basilar pigtail catheter. The tubing is noted to be looped or kinked projecting over  the mid aspect of the hemithorax. 2. Grossly unchanged right basilar opacity and pleural thickening/effusion. Electronically Signed   By: Andrea Gasman M.D.   On: 09/06/2023 15:20   DG CHEST PORT 1 VIEW Result Date: 09/05/2023 CLINICAL DATA:  [id number] Chest tube in place [id number] EXAM: PORTABLE CHEST 1 VIEW COMPARISON:  Chest x-ray 09/03/2023 FINDINGS: Patient is rotated limiting evaluation. Redemonstration of a right chest tube with pigtail overlying the right hemidiaphragm with persistent kinking. The heart and mediastinal contours are within normal limits. Right mid to lower lung zone patchy airspace opacities. No pulmonary edema. Persistent at least trace to small volume right pleural effusion. No pneumothorax. No acute osseous abnormality. IMPRESSION: 1. Persistent at least trace to small volume right pleural effusion. 2. Right mid to lower lung zone patchy airspace opacities. 3. Redemonstration of a right chest tube with pigtail overlying the right hemidiaphragm with persistent kinking. Electronically Signed   By: Morgane  Naveau M.D.   On: 09/05/2023 13:59   US  RENAL Result Date: 09/05/2023 CLINICAL DATA:  Acute renal insufficiency, left renal cysts EXAM: RENAL / URINARY TRACT ULTRASOUND COMPLETE COMPARISON:  09/03/2023 FINDINGS: Right Kidney: Renal measurements: 10.4 x 5.2 x 5.2 cm = volume: 145.6 mL. Echogenicity within normal limits. No mass or hydronephrosis visualized. Left Kidney: Renal measurements: 11.5 x 4.7 x 6.2 cm = volume: 172.4 mL. Echogenicity within normal limits. No mass or hydronephrosis visualized. The simple left renal cysts seen on prior CT are not well visualized on this exam, but do not require specific imaging follow-up. Bladder: Appears normal for degree of bladder distention. Other: None. IMPRESSION: 1. Unremarkable renal ultrasound. Electronically Signed   By: Ozell Daring M.D.   On: 09/05/2023 11:26   ECHOCARDIOGRAM COMPLETE Result Date: 09/03/2023    ECHOCARDIOGRAM  REPORT   Patient Name:   HAIK MAHONEY Date of Exam: 09/03/2023 Medical Rec #:  [id number]         Height:       71.0 in Accession #:    [id number]        Weight:       210.3 lb Date of Birth:  [birth year]         BSA:          2.154 m Patient Age:    73 years          BP:           123/85 mmHg Patient Gender: M                 HR:  87 bpm. Exam Location:  Inpatient Procedure: 2D Echo, Color Doppler and Cardiac Doppler (Both Spectral and Color            Flow Doppler were utilized during procedure). Indications:    A fib  History:        Patient has prior history of Echocardiogram examinations, most                 recent 05/03/2023. CAD.  Sonographer:    Benard Stallion Referring Phys: [id number] RAMESH KC IMPRESSIONS  1. Left ventricular ejection fraction, by estimation, is 55 to 60%. The left ventricle has normal function. Left ventricular diastolic parameters were normal.  2. Right ventricular systolic function is normal. The right ventricular size is normal.  3. Mild mitral valve regurgitation.  4. The aortic valve is tricuspid. Aortic valve regurgitation is trivial. Aortic valve sclerosis/calcification is present, without any evidence of aortic stenosis. FINDINGS  Left Ventricle: Left ventricular ejection fraction, by estimation, is 55 to 60%. The left ventricle has normal function. The left ventricular internal cavity size was normal in size. There is no left ventricular hypertrophy. Left ventricular diastolic parameters were normal. Right Ventricle: The right ventricular size is normal. Right vetricular wall thickness was not assessed. Right ventricular systolic function is normal. Left Atrium: Left atrial size was normal in size. Right Atrium: Right atrial size was normal in size. Pericardium: There is no evidence of pericardial effusion. Mitral Valve: There is mild thickening of the mitral valve leaflet(s). Mild mitral valve regurgitation. Tricuspid Valve: The tricuspid valve is normal in  structure. Tricuspid valve regurgitation is mild. Aortic Valve: The aortic valve is tricuspid. Aortic valve regurgitation is trivial. Aortic valve sclerosis/calcification is present, without any evidence of aortic stenosis. Aortic valve mean gradient measures 3.0 mmHg. Aortic valve peak gradient measures 5.9 mmHg. Aortic valve area, by VTI measures 3.23 cm. Pulmonic Valve: The pulmonic valve was normal in structure. Pulmonic valve regurgitation is not visualized. Aorta: The aortic root is normal in size and structure. IAS/Shunts: No atrial level shunt detected by color flow Doppler.  LEFT VENTRICLE PLAX 2D LVIDd:         3.80 cm   Diastology LVIDs:         2.66 cm   LV e' medial:    10.10 cm/s LV PW:         1.00 cm   LV E/e' medial:  10.0 LV IVS:        1.04 cm   LV e' lateral:   15.70 cm/s LVOT diam:     2.20 cm   LV E/e' lateral: 6.4 LV SV:         58 LV SV Index:   27 LVOT Area:     3.80 cm  RIGHT VENTRICLE RV S prime:     18.50 cm/s TAPSE (M-mode): 1.6 cm LEFT ATRIUM             Index        RIGHT ATRIUM          Index LA diam:        4.48 cm 2.08 cm/m   RA Area:     9.25 cm LA Vol (A2C):   61.5 ml 28.55 ml/m  RA Volume:   14.40 ml 6.68 ml/m LA Vol (A4C):   57.8 ml 26.83 ml/m LA Biplane Vol: 62.1 ml 28.83 ml/m  AORTIC VALVE AV Area (Vmax):    3.14 cm AV Area (Vmean):   2.99 cm AV  Area (VTI):     3.23 cm AV Vmax:           121.00 cm/s AV Vmean:          81.300 cm/s AV VTI:            0.179 m AV Peak Grad:      5.9 mmHg AV Mean Grad:      3.0 mmHg LVOT Vmax:         100.00 cm/s LVOT Vmean:        63.900 cm/s LVOT VTI:          0.152 m LVOT/AV VTI ratio: 0.85  AORTA Ao Root diam: 3.52 cm MITRAL VALVE MV Area (PHT): 3.77 cm     SHUNTS MV Decel Time: 201 msec     Systemic VTI:  0.15 m MV E velocity: 101.00 cm/s  Systemic Diam: 2.20 cm MV A velocity: 94.30 cm/s MV E/A ratio:  1.07 Vina Gull MD Electronically signed by Vina Gull MD Signature Date/Time: 09/03/2023/4:13:37 PM    Final    DG Chest Port 1  View Result Date: 09/03/2023 CLINICAL DATA:  Chest tube in place. EXAM: PORTABLE CHEST 1 VIEW COMPARISON:  Radiograph and CT earlier today FINDINGS: Right pigtail catheter coiled in the medial lower hemithorax. Diminished right pleural effusion. Persistent fluid tracking into the inter lobar fissure low lung volumes limit assessment. Stable heart size and mediastinal contours. Bronchovascular crowding related to low lung volumes. No visible pneumothorax. IMPRESSION: Right pigtail catheter in place with diminished right pleural effusion. Persistent small effusion and fluid tracking into the inter lobar fissure. No visible pneumothorax. Electronically Signed   By: Andrea Gasman M.D.   On: 09/03/2023 15:47   CT Angio Chest/Abd/Pel for Dissection W and/or W/WO Result Date: 09/03/2023 EXAM: CTA CHEST, ABDOMEN AND PELVIS WITHOUT AND WITH CONTRAST 09/03/2023 10:52:01 AM TECHNIQUE: CTA of the chest was performed without and with the administration of intravenous contrast. CTA of the abdomen and pelvis was performed without and with the administration of intravenous contrast. Multiplanar reformatted images are provided for review. MIP images are provided for review. Automated exposure control, iterative reconstruction, and/or weight based adjustment of the mA/kV was utilized to reduce the radiation dose to as low as reasonably achievable. COMPARISON: AP radiograph of the chest dated 09/03/2023 and CT of the abdomen and pelvis dated 08/31/2023 and CT angiogram of the chest dated 08/31/2023. CLINICAL HISTORY: Chest pain, nonspecific. FINDINGS: VASCULATURE: AORTA: No acute finding. No abdominal aortic aneurysm. No dissection. PULMONARY ARTERIES: No pulmonary embolism with the limits of this exam. The main pulmonary artery is mildly prominent. GREAT VESSELS OF AORTIC ARCH: No acute finding. No dissection. No arterial occlusion or significant stenosis. CELIAC TRUNK: No acute finding. No occlusion or significant stenosis.  SUPERIOR MESENTERIC ARTERY: No acute finding. No occlusion or significant stenosis. INFERIOR MESENTERIC ARTERY: No acute finding. No occlusion or significant stenosis. RENAL ARTERIES: No acute finding. No occlusion or significant stenosis. ILIAC ARTERIES: No acute finding. No occlusion or significant stenosis. CHEST: MEDIASTINUM: The heart is mildly enlarged and there is moderate calcific coronary artery disease present. No mediastinal lymphadenopathy. The pericardium demonstrates no acute abnormality. LUNGS AND PLEURA: There are hazy and streaky opacities present dependently within the lungs bilaterally, worse on the left. There is a mild-to-moderate right-sided pleural effusion. No focal consolidation or pulmonary edema. No evidence of pneumothorax. THORACIC BONES AND SOFT TISSUES: No acute bone or soft tissue abnormality. ABDOMEN AND PELVIS: LIVER: The liver is unremarkable. GALLBLADDER AND  BILE DUCTS: There are numerous stones laying dependently within the gallbladder. No biliary ductal dilatation. SPLEEN: The spleen is unremarkable. PANCREAS: The pancreas is unremarkable. ADRENAL GLANDS: Bilateral adrenal glands demonstrate no acute abnormality. KIDNEYS, URETERS AND BLADDER: No stones in the kidneys or ureters. No hydronephrosis. No perinephric or periureteral stranding. Urinary bladder is unremarkable. There is a simple cyst arising anteriorly from the left kidney, which does not require follow up. GI AND BOWEL: Stomach and duodenal sweep demonstrate no acute abnormality. There is no bowel obstruction. No abnormal bowel wall thickening or distension. REPRODUCTIVE: Reproductive organs are unremarkable. PERITONEUM AND RETROPERITONEUM: No ascites or free air. LYMPH NODES: No lymphadenopathy. ABDOMINAL BONES AND SOFT TISSUES: No acute abnormality of the bones. No acute soft tissue abnormality. IMPRESSION: 1. No evidence of pulmonary embolism. 2. Mildly enlarged heart with moderate calcific coronary artery disease.  3. Bibasilar dependent atelectasis, worse on the right and mild-to-moderate right-sided pleural effusion. 4. Numerous stones laying dependently within the gallbladder. Electronically signed by: evalene berrigan 09/03/2023 11:20 AM EDT RP Workstation: [id number]   DG Abd 1 View Result Date: 09/03/2023 CLINICAL DATA:  10323 Abdominal distension [id number] EXAM: ABDOMEN - 1 VIEW COMPARISON:  August 28, 2023 FINDINGS: Nonobstructive bowel gas pattern. Moderate volume fecal loading throughout the ascending colon. Diffuse gaseous distention of the remaining colon. No pneumoperitoneum. No organomegaly or radiopaque calculi. No acute fracture or destructive lesion. Multilevel thoracolumbar osteophytosis. IMPRESSION: Moderate volume fecal loading throughout the ascending colon with progressive, diffuse gaseous distension of the remaining colon, worrisome for constipation and ileus. Otherwise, no findings of small bowel obstruction. Electronically Signed   By: Rogelia Myers M.D.   On: 09/03/2023 10:23   DG CHEST PORT 1 VIEW Result Date: 09/03/2023 CLINICAL DATA:  13927 Pleuritic chest pain 13927 EXAM: PORTABLE CHEST - 1 VIEW COMPARISON:  09/02/2023 FINDINGS: Redemonstrated low lung volumes. Patchy, nodular airspace opacities in the right lung base with blunting of the right costophrenic sulcus. Streaky atelectasis in the left lung base. Mild cardiomegaly. Sternotomy wires and CABG markers. Tortuous aorta with aortic atherosclerosis. No acute fracture or destructive lesions. Multilevel thoracic osteophytosis. IMPRESSION: Low lung volumes. Small right pleural effusion with patchy, nodular opacities in the right lung base, likely atelectasis. Electronically Signed   By: Rogelia Myers M.D.   On: 09/03/2023 10:11   DG Ribs Unilateral W/Chest Right Result Date: 09/02/2023 CLINICAL DATA:  Right-sided chest pain EXAM: RIGHT RIBS AND CHEST - 3+ VIEW COMPARISON:  Chest x-ray 09/02/2023.  Chest 08/31/2023 FINDINGS: No fracture or  other bone lesions are seen involving the ribs. Lung volumes are low. There are atelectatic changes in the right lower lung. There is no pleural effusion or pneumothorax. Patient is status post cardiac surgery. Cardiomediastinal silhouette is stable. There is no pneumothorax. IMPRESSION: 1. No evidence of rib fracture. 2. Low lung volumes with atelectatic changes in the right lower lung. Electronically Signed   By: Greig Pique M.D.   On: 09/02/2023 22:57   IR THORACENTESIS ASP PLEURAL SPACE W/IMG GUIDE Result Date: 09/02/2023 INDICATION: Inpatient with new onset Afib with RVR on amiodarone  and acute CHF. Right pleural effusion noted on recent imaging, request for diagnostic and therapeutic thoracentesis. EXAM: ULTRASOUND GUIDED RIGHT THORACENTESIS MEDICATIONS: 10 mL 1% lidocaine  COMPLICATIONS: None immediate. PROCEDURE: An ultrasound guided thoracentesis was thoroughly discussed with the patient and questions answered. The benefits, risks, alternatives and complications were also discussed. The patient understands and wishes to proceed with the procedure. Written consent was obtained. Ultrasound was performed to localize  and mark an adequate pocket of fluid in the right chest. The area was then prepped and draped in the normal sterile fashion. 1% Lidocaine  was used for local anesthesia. Under ultrasound guidance a 6 Fr Safe-T-Centesis catheter was introduced. Thoracentesis was performed. Patient reported back spasm during exam and requested exam be ended due to pain. The catheter was removed and a dressing applied. CXR ordered post-procedure. FINDINGS: A total of approximately 350 milliliters of amber fluid was removed. Samples were sent to the laboratory as requested by the clinical team. IMPRESSION: Successful ultrasound guided right thoracentesis yielding 350 milliliters of pleural fluid. Performed and dictated by Laymon Coast, NP Electronically Signed   By: Ester Sides M.D.   On: 09/02/2023 13:07    DG Chest Port 1 View Result Date: 09/02/2023 CLINICAL DATA:  Pleural effusion.  Status post right thoracentesis. EXAM: PORTABLE CHEST 1 VIEW COMPARISON:  08/31/2023 FINDINGS: Low volume film. No evidence for pneumothorax. Decrease in right pleural effusion since prior study with persistent bibasilar atelectasis. Gaseous distention of the stomach evident under the left hemidiaphragm. Nodular density noted left lung base with no suspicious pulmonary nodule or mass in this region on CT scan 2 days ago. The cardio pericardial silhouette is enlarged. IMPRESSION: 1. No evidence for pneumothorax after right thoracentesis. 2. Decrease in right pleural effusion with persistent bibasilar atelectasis. Electronically Signed   By: Camellia Candle M.D.   On: 09/02/2023 12:53   MR LUMBAR SPINE WO CONTRAST Result Date: 09/01/2023 CLINICAL DATA:  Initial evaluation for acute lumbar radiculopathy. EXAM: MRI LUMBAR SPINE WITHOUT CONTRAST TECHNIQUE: Multiplanar, multisequence MR imaging of the lumbar spine was performed. No intravenous contrast was administered. COMPARISON:  CT from 08/31/2023. FINDINGS: Segmentation: Transitional features about the lumbosacral junction with a lumbarized S1 segment and rudimentary S1-2 interspace. Alignment: Mild exaggeration of the normal lumbar lordosis. Trace degenerative retrolisthesis of L1 on L2. Vertebrae: Vertebral body height maintained without acute or chronic fracture. Bone marrow signal intensity within normal limits. No discrete or worrisome osseous lesions or abnormal marrow edema. Conus medullaris and cauda equina: Conus extends to the T12-L1 level. Conus and cauda equina appear normal. Paraspinal and other soft tissues: Unremarkable. Disc levels: L1-2: Disc desiccation with diffuse disc bulge, slightly asymmetric to the left. Minimal reactive endplate spurring. No spinal stenosis. Foramina remain patent. L2-3: Disc desiccation. Small left foraminal to extraforaminal disc  protrusion closely approximates the exiting left L2 nerve root. Mild bilateral facet hypertrophy. No spinal stenosis. Foramina remain patent. L3-4: Mild intervertebral disc space narrowing with diffuse disc bulge and disc desiccation. Disc bulging asymmetric to the right with right-sided reactive endplate spurring. Mild bilateral facet arthrosis. Resultant mild to moderate narrowing of the right lateral recess. Central canal remains patent. Moderate right L3 foraminal stenosis. Left neural foramen remains patent. L4-5: Disc desiccation with mild disc bulge. Superimposed right foraminal to extraforaminal disc protrusion closely approximates the exiting right L4 nerve root (series 16, image 17). Mild moderate bilateral facet hypertrophy. No significant spinal stenosis. No more than mild bilateral foraminal narrowing. L5-S1: Disc desiccation with diffuse disc bulge. Mild reactive endplate spurring, worse on the right. Moderate facet and ligament flavum hypertrophy. Resultant mild narrowing of the lateral recesses bilaterally. Greater on the right. Central canal remains patent. No significant foraminal stenosis. S1-2: Negative interspace.  Mild facet hypertrophy.  No stenosis. IMPRESSION: 1. No acute abnormality within the lumbar spine. 2. Right foraminal to extraforaminal disc protrusion at L4-5, potentially affecting the exiting right L4 nerve root. 3. Small left  foraminal to extraforaminal disc protrusion at L2-3, potentially affecting the exiting left L2 nerve root. 4. Right eccentric disc bulge with facet hypertrophy at L3-4 with resultant mild to moderate right lateral recess stenosis, with moderate right L3 foraminal narrowing. 5. Disc bulge with facet hypertrophy at L5-S1 with resultant mild bilateral lateral recess stenosis. 6. Transitional lumbosacral anatomy with a lumbarized S1 segment. Careful correlation with numbering system on this exam recommended prior to any potential future intervention.  Electronically Signed   By: Morene Hoard M.D.   On: 09/01/2023 03:03   MR BRAIN WO CONTRAST Result Date: 09/01/2023 CLINICAL DATA:  Initial evaluation for acute neuro deficit, stroke suspected. EXAM: MRI HEAD WITHOUT CONTRAST TECHNIQUE: Multiplanar, multiecho pulse sequences of the brain and surrounding structures were obtained without intravenous contrast. COMPARISON:  CT from 08/31/2023 FINDINGS: Brain: Cerebral volume within normal limits. Scattered subcentimeter foci of T2/FLAIR hyperintensity noted involving the periventricular, deep, and subcortical white matter both cerebral hemispheres, nonspecific, but most commonly related to chronic microvascular ischemic disease. Overall, changes are mild in nature. No evidence for acute or subacute infarct. No areas of chronic cortical infarction. No acute or chronic intracranial blood products. No mass lesion, midline shift or mass effect no hydrocephalus or extra-axial fluid collection. Pituitary gland within normal limits. Vascular: Major intracranial vascular flow voids are maintained. Skull and upper cervical spine: Cranial junction with normal limits. Bone marrow signal intensity normal. No scalp soft tissue abnormality. Sinuses/Orbits: Prior bilateral ocular lens replacement. Small left maxillary sinus retention cyst. Paranasal sinuses are otherwise largely clear. No significant mastoid effusion. Other: None. IMPRESSION: 1. No acute intracranial abnormality. 2. Mild cerebral white matter disease, nonspecific, but most commonly related to chronic microvascular ischemic disease. Electronically Signed   By: Morene Hoard M.D.   On: 09/01/2023 02:56   US  Abdomen Limited RUQ (LIVER/GB) Result Date: 08/31/2023 CLINICAL DATA:  Pain EXAM: ULTRASOUND ABDOMEN LIMITED RIGHT UPPER QUADRANT COMPARISON:  CT abdomen and pelvis 08/31/2023. FINDINGS: Gallbladder: Multiple gallstones are present measuring up to 1.8 cm. There is no pericholecystic fluid or  gallbladder wall thickening. No sonographic Murphy sign noted by sonographer. Common bile duct: Diameter: 4.2 mm Liver: No focal lesion identified. Within normal limits in parenchymal echogenicity. Portal vein is patent on color Doppler imaging with normal direction of blood flow towards the liver. Other: None. IMPRESSION: Cholelithiasis without sonographic evidence of acute cholecystitis. Electronically Signed   By: Greig Pique M.D.   On: 08/31/2023 23:52   CT Angio Chest PE W and/or Wo Contrast Result Date: 08/31/2023 CLINICAL DATA:  Probability pulmonary embolism EXAM: CT ANGIOGRAPHY CHEST WITH CONTRAST TECHNIQUE: Multidetector CT imaging of the chest was performed using the standard protocol during bolus administration of intravenous contrast. Multiplanar CT image reconstructions and MIPs were obtained to evaluate the vascular anatomy. RADIATION DOSE REDUCTION: This exam was performed according to the departmental dose-optimization program which includes automated exposure control, adjustment of the mA and/or kV according to patient size and/or use of iterative reconstruction technique. CONTRAST:  75mL OMNIPAQUE  IOHEXOL  350 MG/ML SOLN COMPARISON:  None Available. FINDINGS: Cardiovascular: There is adequate opacification of the pulmonary arterial tree. No intraluminal filling defect identified to suggest acute pulmonary embolism. The central pulmonary arteries are enlarged in keeping with changes of pulmonary arterial hypertension. Coronary artery bypass grafting has been performed. Mild global cardiomegaly. No pericardial effusion. No significant atherosclerotic calcification within the thoracic aorta. No aortic aneurysm. Mediastinum/Nodes: No enlarged mediastinal, hilar, or axillary lymph nodes. Thyroid  gland, trachea, and esophagus demonstrate no  significant findings. Lungs/Pleura: Moderate right pleural effusion with associated right basilar dependent atelectasis. No superimposed confluent pulmonary  infiltrate. No pneumothorax. No pleural effusion on the left. No central obstructing lesion. Upper Abdomen: No acute abnormality. Musculoskeletal: No chest wall abnormality. No acute or significant osseous findings. Review of the MIP images confirms the above findings. IMPRESSION: 1. No pulmonary embolism. 2. Morphologic changes in keeping with pulmonary arterial hypertension. 3. Mild global cardiomegaly. Status post coronary artery bypass grafting. 4. Moderate right pleural effusion with associated right basilar dependent atelectasis. Electronically Signed   By: Dorethia Molt M.D.   On: 08/31/2023 22:04   CT ABDOMEN PELVIS W CONTRAST Result Date: 08/31/2023 CLINICAL DATA:  Acute abdominal pain EXAM: CT ABDOMEN AND PELVIS WITH CONTRAST TECHNIQUE: Multidetector CT imaging of the abdomen and pelvis was performed using the standard protocol following bolus administration of intravenous contrast. RADIATION DOSE REDUCTION: This exam was performed according to the departmental dose-optimization program which includes automated exposure control, adjustment of the mA and/or kV according to patient size and/or use of iterative reconstruction technique. CONTRAST:  75mL OMNIPAQUE  IOHEXOL  350 MG/ML SOLN COMPARISON:  None Available. FINDINGS: Hepatobiliary: Gallbladder is well distended with multiple dependent gallstones. The liver is within normal limits. Pancreas: Unremarkable. No pancreatic ductal dilatation or surrounding inflammatory changes. Spleen: Normal in size without focal abnormality. Adrenals/Urinary Tract: Adrenal glands are within normal limits bilaterally. Kidneys demonstrate a normal enhancement pattern bilaterally. Few small cysts are noted stable from the prior exam. No follow-up is recommended. No obstructive changes are seen. The bladder is partially distended. Stomach/Bowel: Colon is distended with air although no obstructive or inflammatory changes are seen. Mild scattered fecal material is noted. The  appendix is not well visualized. No inflammatory changes to suggest appendicitis are noted. Small bowel and stomach are within normal limits. Vascular/Lymphatic: Aortic atherosclerosis. No enlarged abdominal or pelvic lymph nodes. Reproductive: Prostate is unremarkable. Other: No abdominal wall hernia or abnormality. No abdominopelvic ascites. Musculoskeletal: No acute or significant osseous findings. IMPRESSION: Cholelithiasis without complicating factors. No other focal abnormality is seen. Electronically Signed   By: Oneil Devonshire M.D.   On: 08/31/2023 21:54   CT L-SPINE NO CHARGE Result Date: 08/31/2023 CLINICAL DATA:  Low back pain EXAM: CT LUMBAR SPINE WITHOUT CONTRAST TECHNIQUE: Multidetector CT imaging of the lumbar spine was performed without intravenous contrast administration. Multiplanar CT image reconstructions were also generated. RADIATION DOSE REDUCTION: This exam was performed according to the departmental dose-optimization program which includes automated exposure control, adjustment of the mA and/or kV according to patient size and/or use of iterative reconstruction technique. COMPARISON:  None Available. FINDINGS: Segmentation: Transitional lumbar anatomy with a lumbarized S1 present and a relatively well developed disc space at S1-2. Alignment: 2-3 mm anterolisthesis L5-S1. Vertebrae: No acute fracture or focal pathologic process. Paraspinal and other soft tissues: See report for accompanying CT examination of the abdomen and pelvis. No paraspinal inflammatory change or fluid collection identified. Disc levels: Intervertebral disc heights are preserved. Spinal canal is widely patent. Osteophytic spurring results in mild bilateral neuroforaminal narrowing at L4-5 and on the right at L5-S1. Remaining neural foramina are widely patent. Mild to moderate facet arthrosis at L5-S1, left greater than right. IMPRESSION: 1. No acute fracture or listhesis of the lumbar spine. 2. Transitional lumbar  anatomy with a lumbarized S1 present and a relatively well developed disc space at S1-2. 3. Mild to moderate facet arthrosis at L5-S1, left greater than right. 4. Mild bilateral neuroforaminal narrowing at L4-5 and on the  right at L5-S1. Electronically Signed   By: Dorethia Molt M.D.   On: 08/31/2023 21:53   CT Head Wo Contrast Result Date: 08/31/2023 CLINICAL DATA:  Altered mental status EXAM: CT HEAD WITHOUT CONTRAST TECHNIQUE: Contiguous axial images were obtained from the base of the skull through the vertex without intravenous contrast. RADIATION DOSE REDUCTION: This exam was performed according to the departmental dose-optimization program which includes automated exposure control, adjustment of the mA and/or kV according to patient size and/or use of iterative reconstruction technique. COMPARISON:  None Available. FINDINGS: Brain: No evidence of acute infarction, hemorrhage, hydrocephalus, extra-axial collection or mass lesion/mass effect. Vascular: No hyperdense vessel or unexpected calcification. Skull: Normal. Negative for fracture or focal lesion. Sinuses/Orbits: No acute finding. Other: Mastoid air cells and middle ear cavities are clear. IMPRESSION: 1. No acute intracranial abnormality. Electronically Signed   By: Dorethia Molt M.D.   On: 08/31/2023 21:47   DG Chest Portable 1 View Result Date: 08/31/2023 CLINICAL DATA:  Shortness of breath EXAM: PORTABLE CHEST 1 VIEW COMPARISON:  01/14/2022 FINDINGS: Cardiac shadow is enlarged. Postsurgical changes are again seen. The overall inspiratory effort is poor. Small right pleural effusion is noted with basilar atelectasis. No bony abnormality is noted. IMPRESSION: Right basilar atelectasis and small effusion. Electronically Signed   By: Oneil Devonshire M.D.   On: 08/31/2023 19:04   DG Lumbar Spine 2-3 Views Result Date: 08/28/2023 CLINICAL DATA:  Low back pain. EXAM: LUMBAR SPINE - 2-3 VIEW COMPARISON:  Lumbar radiograph 01/21/2022 FINDINGS: There are  6 non-rib-bearing lumbar vertebra, the lower most lumbar vertebra is hemi transitional with enlarged left transverse process and pseudoarticulation with the sacrum. The lower most lumbar vertebra will be labeled L5 by convention. Broad-based levo scoliotic curvature. Grade 1 anterolisthesis of L4 on L5. No evidence of fracture or compression deformity. Mild diffuse disc space narrowing and spurring. Moderate L4-L5 and mild L5-S1 facet hypertrophy. IMPRESSION: 1. Mild diffuse degenerative disc disease. Moderate L4-L5 and mild L5-S1 facet hypertrophy. 2. Grade 1 anterolisthesis of L4 on L5. 3. Transitional lumbosacral anatomy. Electronically Signed   By: Andrea Gasman M.D.   On: 08/28/2023 14:37   DG Pelvis 1-2 Views Result Date: 08/28/2023 CLINICAL DATA:  Low back and hip pain. EXAM: PELVIS - 1-2 VIEW COMPARISON:  None Available. FINDINGS: The hip joint spaces are preserved. Femoral heads are well seated. No fracture. No evidence of erosion or focal bone abnormality. No visualized avascular necrosis. The pubic symphysis and sacroiliac joints are congruent. Pelvic phleboliths and vascular calcifications. IMPRESSION: Negative radiograph of the pelvis. Electronically Signed   By: Andrea Gasman M.D.   On: 08/28/2023 14:35    Microbiology: Results for orders placed or performed during the hospital encounter of 08/31/23  Culture, body fluid w Gram Stain-bottle     Status: Abnormal   Collection Time: 09/02/23 12:36 PM   Specimen: Pleura  Result Value Ref Range Status   Specimen Description PLEURAL RIGHT  Final   Special Requests NONE  Final   Gram Stain   Final    GRAM POSITIVE COCCI IN CLUSTERS IN BOTH AEROBIC AND ANAEROBIC BOTTLES CRITICAL RESULT CALLED TO, READ BACK BY AND VERIFIED WITH: RN LONELL MATSU [id number] FCP Performed at [facility], [address], [place], [place] [address]    Culture STAPHYLOCOCCUS AUREUS (A)  Final   Report Status 09/05/2023 FINAL  Final   Organism ID, Bacteria  STAPHYLOCOCCUS AUREUS  Final      Susceptibility   Staphylococcus  aureus - MIC*    CIPROFLOXACIN <=0.5 SENSITIVE Sensitive     ERYTHROMYCIN <=0.25 SENSITIVE Sensitive     GENTAMICIN <=0.5 SENSITIVE Sensitive     OXACILLIN <=0.25 SENSITIVE Sensitive     TETRACYCLINE <=1 SENSITIVE Sensitive     VANCOMYCIN  1 SENSITIVE Sensitive     TRIMETH/SULFA <=10 SENSITIVE Sensitive     CLINDAMYCIN <=0.25 SENSITIVE Sensitive     RIFAMPIN <=0.5 SENSITIVE Sensitive     Inducible Clindamycin NEGATIVE Sensitive     LINEZOLID 2 SENSITIVE Sensitive     * STAPHYLOCOCCUS AUREUS  Gram stain     Status: None   Collection Time: 09/02/23 12:36 PM   Specimen: Pleura  Result Value Ref Range Status   Specimen Description PLEURAL RIGHT  Final   Special Requests NONE  Final   Gram Stain   Final    RARE WBC PRESENT, PREDOMINANTLY PMN NO ORGANISMS SEEN Performed at [facility], [address] [place], [place] [address]    Report Status 09/02/2023 FINAL  Final  Culture, blood (Routine X 2) w Reflex to ID Panel     Status: None   Collection Time: 09/03/23 10:22 AM   Specimen: BLOOD RIGHT HAND  Result Value Ref Range Status   Specimen Description BLOOD RIGHT HAND  Final   Special Requests   Final    BOTTLES DRAWN AEROBIC AND ANAEROBIC Blood Culture results may not be optimal due to an inadequate volume of blood received in culture bottles   Culture   Final    NO GROWTH 5 DAYS Performed at [facility], [address], [place], [place] [address]    Report Status 09/08/2023 FINAL  Final  Culture, blood (Routine X 2) w Reflex to ID Panel     Status: None   Collection Time: 09/03/23 10:36 AM   Specimen: BLOOD LEFT HAND  Result Value Ref Range Status   Specimen Description BLOOD LEFT HAND  Final   Special Requests   Final    BOTTLES DRAWN AEROBIC ONLY Blood Culture adequate volume   Culture   Final    NO GROWTH 5 DAYS Performed at [facility], [address], [place], [place] [address]     Report Status 09/08/2023 FINAL  Final  MRSA Next Gen by PCR, Nasal     Status: None   Collection Time: 09/03/23  2:22 PM   Specimen: Nasal Mucosa; Nasal Swab  Result Value Ref Range Status   MRSA by PCR Next Gen NOT DETECTED NOT DETECTED Final    Comment: (NOTE) The GeneXpert MRSA Assay (FDA approved for NASAL specimens only), is one component of a comprehensive MRSA colonization surveillance program. It is not intended to diagnose MRSA infection nor to guide or monitor treatment for MRSA infections. Test performance is not FDA approved in patients less than 31 years old. Performed at [facility], [address] [place], [place] [address]   SARS Coronavirus 2 by RT PCR (hospital order, performed in [facility] hospital lab) *cepheid single result test* Anterior Nasal Swab     Status: None   Collection Time: 09/08/23 12:34 PM   Specimen: Anterior Nasal Swab  Result Value Ref Range Status   SARS Coronavirus 2 by RT PCR NEGATIVE NEGATIVE Final    Comment: Performed at [facility], [address], [place], [place] [address]    Labs: CBC: Recent Labs  Lab 09/07/23 [phone] 09/07/23 [phone] 09/07/23 1851 09/08/23 0228 09/08/23 1933 09/09/23  9771 09/09/23 0945 09/10/23 0722  WBC 12.9* 12.7*  --  11.0*  --  10.9*  --  11.3*  HGB 7.8* 6.6*   < > 7.5* 7.7* 6.8* 8.5* 8.3*  HCT 24.1* 20.3*   < > 22.0* 23.2* 20.6* 25.3* 24.4*  MCV 90.6 91.4  --  89.4  --  90.7  --  90.4  PLT 315 316  --  259  --  249  --  306   < > = values in this interval not displayed.   Basic Metabolic Panel: Recent Labs  Lab 09/06/23 0307 09/07/23 0048 09/07/23 0852 09/08/23 0228 09/09/23 0228  NA 137 135 133* 135 137  K 4.0 4.3 4.1 4.0 3.7  CL 105 103 102 100 102  CO2 26 24 24 24 25   GLUCOSE 112* 141* 125* 117* 142*  BUN 18 19 18 15 13   CREATININE 1.32* 1.21 1.11 1.02 1.07  CALCIUM  7.9* 8.0* 7.6* 7.8* 7.9*   Liver Function Tests: Recent Labs  Lab 09/04/23 0221 09/09/23 0228  AST 24 41   ALT 37 18  ALKPHOS 79 70  BILITOT 0.5 0.4  PROT 6.3* 5.2*  ALBUMIN 2.1* 1.8*   CBG: No results for input(s): GLUCAP in the last 168 hours.  Discharge time spent: approximately 45 minutes spent on discharge counseling, evaluation of patient on day of discharge, and coordination of discharge planning with nursing, social work, pharmacy and case management  Signed: Lonni SHAUNNA Dalton, MD [facility] 09/10/2023

## 2023-09-10 NOTE — Progress Notes (Signed)
 Occupational Therapy Treatment Patient Details Name: Jacob [name] MRN: [id number] DOB: August 06, 1950 Today's Date: 09/10/2023   History of present illness 73 y.o. male admitted 8/12 with A-fib with RVR and severe back pain. MRI shows mild generalized spondylosis, L5-S1 facet arthropathy, and L3-4 degenerative disease. 8/15 Rt pleural effusion s/p pigtail catheter. PMH: Crohn's disease, CAD s/p CABG, CVA, carotid artery stenosis c/p CEA, HTN, HLD   OT comments  Pt progressing well towards goals. Progressed to complete standing grooming tasks at sink with CGA for balance. Pt still limited by decreased activity tolerance, causing decreased balance. Pt reporting fatigue level 8/10 post standing ADLs. Provided and reviewed energy conservation strategy educational handout with pt and family, verbalizing understanding of education provided. Continue to recommend HHOT to optimize independence levels. Will continue to follow acutely.       If plan is discharge home, recommend the following:  A little help with walking and/or transfers;A lot of help with bathing/dressing/bathroom;Assistance with cooking/housework;Assist for transportation;Help with stairs or ramp for entrance   Equipment Recommendations  None recommended by OT       Precautions / Restrictions Precautions Precautions: Fall Recall of Precautions/Restrictions: Intact Restrictions Weight Bearing Restrictions Per Provider Order: No       Mobility Bed Mobility Overal bed mobility: Modified Independent      Transfers Overall transfer level: Needs assistance Equipment used: Rolling walker (2 wheels) Transfers: Sit to/from Stand Sit to Stand: Min assist   General transfer comment: Min assist to rise, cues for hand placement     Balance Overall balance assessment: Needs assistance Sitting-balance support: No upper extremity supported, Feet supported Sitting balance-Leahy Scale: Good     Standing balance support:  Bilateral upper extremity supported, Reliant on assistive device for balance, During functional activity Standing balance-Leahy Scale: Fair Standing balance comment: Benefits from single UE support in standing         ADL either performed or assessed with clinical judgement   ADL Overall ADL's : Needs assistance/impaired     Grooming: Oral care;Wash/dry hands;Wash/dry face;Brushing hair;Contact guard assist;Standing Grooming Details (indicate cue type and reason): CGA for balance   Toilet Transfer: Contact guard assist;Rolling walker (2 wheels);Ambulation Toilet Transfer Details (indicate cue type and reason): Simulated in room         Functional mobility during ADLs: Contact guard assist;Rolling walker (2 wheels) General ADL Comments: Improved standing activity tolerance, as fatigue increases, balance decreases    Extremity/Trunk Assessment Upper Extremity Assessment Upper Extremity Assessment: Generalized weakness   Lower Extremity Assessment Lower Extremity Assessment: Defer to PT evaluation        Vision   Vision Assessment?: No apparent visual deficits         Communication Communication Communication: No apparent difficulties   Cognition Arousal: Alert Behavior During Therapy: WFL for tasks assessed/performed Cognition: No apparent impairments     Following commands: Intact        Cueing   Cueing Techniques: Verbal cues        General Comments VSS on RA.Access Code: [email or web address]  URL: [email or web address]  Date: 09/10/2023  Prepared by: Adrianne Savers    Patient Education  - Understanding Energy Conservation  - Energy Conservation During Daily Tasks    Pertinent Vitals/ Pain       Pain Assessment Pain Assessment: 0-10 Pain Score: 6  Pain Location: back Pain Descriptors / Indicators: Sore Pain Intervention(s): Monitored during session   Frequency  Min 2X/week        Progress Toward Goals  OT Goals(current goals can now be  found in the care plan section)  Progress towards OT goals: Progressing toward goals  Acute Rehab OT Goals Patient Stated Goal: To go home OT Goal Formulation: With patient Time For Goal Achievement: 09/16/23 Potential to Achieve Goals: Good ADL Goals Pt Will Perform Grooming: standing;with modified independence Pt Will Perform Upper Body Bathing: with set-up;sitting Pt Will Perform Lower Body Bathing: with min assist Pt Will Perform Upper Body Dressing: with set-up;sitting Pt Will Perform Lower Body Dressing: with min assist Pt Will Transfer to Toilet: with modified independence;ambulating;bedside commode Pt Will Perform Toileting - Clothing Manipulation and hygiene: with min assist Additional ADL Goal #1: Pt will be Mod I in and OOB for basic ADLs  Plan         AM-PAC OT 6 Clicks Daily Activity     Outcome Measure   Help from another person eating meals?: None Help from another person taking care of personal grooming?: A Little Help from another person toileting, which includes using toliet, bedpan, or urinal?: A Little Help from another person bathing (including washing, rinsing, drying)?: A Little Help from another person to put on and taking off regular upper body clothing?: A Little Help from another person to put on and taking off regular lower body clothing?: A Little 6 Click Score: 19    End of Session Equipment Utilized During Treatment: Gait belt;Rolling walker (2 wheels)  OT Visit Diagnosis: Unsteadiness on feet (R26.81);Other abnormalities of gait and mobility (R26.89);Pain   Activity Tolerance Patient tolerated treatment well   Patient Left in chair;with call bell/phone within reach;with family/visitor present   Nurse Communication Mobility status        Time: 9158-9091 OT Time Calculation (min): 27 min  Charges: OT General Charges $OT Visit: 1 Visit OT Treatments $Self Care/Home Management : 23-37 mins  Adrianne BROCKS, OT  Acute Rehabilitation  Services Office [phone] Secure chat preferred   Adrianne GORMAN Savers 09/10/2023, 9:20 AM

## 2023-09-13 NOTE — Telephone Encounter (Signed)
 Patient scheduled.

## 2023-09-14 ENCOUNTER — Telehealth: Payer: Self-pay

## 2023-09-14 NOTE — Telephone Encounter (Signed)
 Central scheduling Jacob [name] called to let us  know patient was contacted to schedule him for his Dat scan and patient informed her that he would like to hold on scheduling his Dat scan since he just got out of the hospital.

## 2023-09-21 ENCOUNTER — Ambulatory Visit
Admission: RE | Admit: 2023-09-21 | Discharge: 2023-09-21 | Disposition: A | Discharge status: Home / Self Care | Source: Ambulatory Visit | Attending: Physician Assistant | Admitting: Physician Assistant

## 2023-09-21 ENCOUNTER — Other Ambulatory Visit: Payer: Self-pay | Admitting: Physician Assistant

## 2023-09-21 DIAGNOSIS — J869 Pyothorax without fistula: Secondary | ICD-10-CM

## 2023-10-04 ENCOUNTER — Encounter: Payer: Self-pay | Admitting: Internal Medicine

## 2023-10-04 ENCOUNTER — Other Ambulatory Visit: Payer: Self-pay

## 2023-10-04 ENCOUNTER — Ambulatory Visit (INDEPENDENT_AMBULATORY_CARE_PROVIDER_SITE_OTHER): Admitting: Internal Medicine

## 2023-10-04 VITALS — BP 116/72 | HR 72 | Temp 98.3°F | Wt 188.0 lb

## 2023-10-04 DIAGNOSIS — J869 Pyothorax without fistula: Secondary | ICD-10-CM | POA: Diagnosis not present

## 2023-10-04 NOTE — Progress Notes (Unsigned)
 RFV: follow up for hospitalization for MSSA pneumonia and empyema  Patient ID: Jacob [name], male   DOB: 22-Sep-1950, 73 y.o.   MRN: [id number]  HPI Mr Longoria, is a 73yo M with complicated pneumonia with empyema, htn, and  When he coughs he has back painreferred from the left side. Has seen his pcp, dr stephanie. Anemia improved by pcp. Has upcoming appt. He is almost finished up with taking amox/clav through end of the week   Outpatient Encounter Medications as of 10/04/2023  Medication Sig   amiodarone  (PACERONE ) 200 MG tablet Take 1 tablet (200 mg total) by mouth 2 (two) times daily.   [Paused] amLODipine  (NORVASC ) 5 MG tablet Take 1 tablet (5 mg total) by mouth daily.   amoxicillin -clavulanate (AUGMENTIN ) 875-125 MG tablet Take 1 tablet by mouth 2 (two) times daily for 24 days.   aspirin  EC 81 MG tablet Take 81 mg by mouth daily. Swallow whole.   atorvastatin  (LIPITOR) 80 MG tablet Take 1 tablet (80 mg total) by mouth daily.   glucosamine-chondroitin 500-400 MG tablet Take 1 tablet by mouth 2 (two) times daily.   HYDROcodone -acetaminophen  (NORCO/VICODIN) 5-325 MG tablet Take 1 tablet by mouth every 8 (eight) hours as needed for severe pain (pain score 7-10).   latanoprost  (XALATAN ) 0.005 % ophthalmic solution Place 1 drop into both eyes at bedtime.   lidocaine  (SALONPAS PAIN RELIEVING) 4 % Place 1 patch onto the skin daily.   [Paused] lisinopril  (ZESTRIL ) 20 MG tablet Take 1 tablet (20 mg total) by mouth daily.   sildenafil (VIAGRA) 100 MG tablet Take 100 mg by mouth daily as needed for erectile dysfunction.   tamsulosin  (FLOMAX ) 0.4 MG CAPS capsule Take 0.4 mg by mouth at bedtime.   tiZANidine  (ZANAFLEX ) 4 MG tablet Take 1 tablet (4 mg total) by mouth 3 (three) times daily.   vitamin B-12 (CYANOCOBALAMIN ) 500 MCG tablet Take 1,000 mcg by mouth daily.   No facility-administered encounter medications on file as of 10/04/2023.     Patient Active Problem List   Diagnosis Date  Noted   Dizziness 09/01/2023   Frequent falls 09/01/2023   Acute low back pain 09/01/2023   Atrial fibrillation with RVR (HCC) 08/31/2023   Acute on chronic back pain 08/31/2023   Abdominal pain 08/31/2023   Acute focal neurological deficit 01/14/2022   Crohn's colitis (HCC) 03/10/2018   Scrotal pain 12/07/2017   Carotid artery stenosis    Confusion    TIA (transient ischemic attack) 06/16/2016   Coronary artery disease due to lipid rich plaque 09/14/2013   Pure hypercholesterolemia 09/14/2013   Essential hypertension, benign 09/14/2013   Bradycardia, sinus 09/14/2013     Health Maintenance Due  Topic Date Due   COVID-19 Vaccine (1) Never done   DTaP/Tdap/Td (1 - Tdap) Never done   Zoster Vaccines- Shingrix (1 of 2) Never done   Colonoscopy  03/24/2020   Influenza Vaccine  08/20/2023   Medicare Annual Wellness (AWV)  10/29/2023     Review of Systems Still has occasional cough. 12 point ros is negative Physical Exam   BP 116/72   Pulse 72   Temp 98.3 F (36.8 C) (Oral)   Wt 188 lb (85.3 kg)   SpO2 97%   BMI 26.22 kg/m   Physical Exam  Constitutional: He is oriented to person, place, and time. He appears well-developed and well-nourished. No distress.  HENT:  Mouth/Throat: Oropharynx is clear and moist. No oropharyngeal exudate.  Cardiovascular: Normal rate, regular rhythm and normal  heart sounds. Exam reveals no gallop and no friction rub.  No murmur heard.  Pulmonary/Chest: Effort normal and breath sounds normal. No respiratory distress. He has no wheezes. Decreased breath sounds on the right. Lymphadenopathy:  He has no cervical adenopathy.  Neurological: He is alert and oriented to person, place, and time.  Skin: Skin is warm and dry. No rash noted. No erythema.  Psychiatric: He has a normal mood and affect. His behavior is normal.    CBC Lab Results  Component Value Date   WBC 11.3 (H) 09/10/2023   RBC 2.70 (L) 09/10/2023   HGB 8.3 (L) 09/10/2023   HCT  24.4 (L) 09/10/2023   PLT 306 09/10/2023   MCV 90.4 09/10/2023   MCH 30.7 09/10/2023   MCHC 34.0 09/10/2023   RDW 14.4 09/10/2023   LYMPHSABS 1.3 08/31/2023   MONOABS 0.9 08/31/2023   EOSABS 0.2 08/31/2023    BMET Lab Results  Component Value Date   NA 137 09/09/2023   K 3.7 09/09/2023   CL 102 09/09/2023   CO2 25 09/09/2023   GLUCOSE 142 (H) 09/09/2023   BUN 13 09/09/2023   CREATININE 1.07 09/09/2023   CALCIUM  7.9 (L) 09/09/2023   GFRNONAA >60 09/09/2023   GFRAA 66 06/07/2019   Lab Results  Component Value Date   WBC 6.3 10/04/2023   HGB 10.9 (L) 10/04/2023   HCT 33.6 (L) 10/04/2023   MCV 92.3 10/04/2023   PLT 222 10/04/2023   Lab Results  Component Value Date   ESRSEDRATE 39 (H) 10/04/2023   Lab Results  Component Value Date   CRP 11.2 (H) 10/04/2023      Assessment and Plan Complicated pneumonia with empyema s/p chest tube drainage= patient prefers to have cxr done with pulmonary Continue on amox.clav until done at the end of week, may need to consider extending depending on repeat cxr findings.   We will get labs today to see leukocytosis resolved, and see down trend in inflammatory markers  Defering flu shot and covid vax until feeling better from pneumonia

## 2023-10-05 LAB — CBC WITH DIFFERENTIAL/PLATELET
Absolute Lymphocytes: 1436 {cells}/uL (ref 850–3900)
Absolute Monocytes: 391 {cells}/uL (ref 200–950)
Basophils Absolute: 57 {cells}/uL (ref 0–200)
Basophils Relative: 0.9 %
Eosinophils Absolute: 132 {cells}/uL (ref 15–500)
Eosinophils Relative: 2.1 %
HCT: 33.6 % — ABNORMAL LOW (ref 38.5–50.0)
Hemoglobin: 10.9 g/dL — ABNORMAL LOW (ref 13.2–17.1)
MCH: 29.9 pg (ref 27.0–33.0)
MCHC: 32.4 g/dL (ref 32.0–36.0)
MCV: 92.3 fL (ref 80.0–100.0)
MPV: 10.8 fL (ref 7.5–12.5)
Monocytes Relative: 6.2 %
Neutro Abs: 4284 {cells}/uL (ref 1500–7800)
Neutrophils Relative %: 68 %
Platelets: 222 Thousand/uL (ref 140–400)
RBC: 3.64 Million/uL — ABNORMAL LOW (ref 4.20–5.80)
RDW: 13.5 % (ref 11.0–15.0)
Total Lymphocyte: 22.8 %
WBC: 6.3 Thousand/uL (ref 3.8–10.8)

## 2023-10-05 LAB — BASIC METABOLIC PANEL WITH GFR
BUN: 21 mg/dL (ref 7–25)
CO2: 28 mmol/L (ref 20–32)
Calcium: 9.3 mg/dL (ref 8.6–10.3)
Chloride: 99 mmol/L (ref 98–110)
Creat: 1.15 mg/dL (ref 0.70–1.28)
Glucose, Bld: 91 mg/dL (ref 65–99)
Potassium: 4.5 mmol/L (ref 3.5–5.3)
Sodium: 134 mmol/L — ABNORMAL LOW (ref 135–146)
eGFR: 68 mL/min/1.73m2 (ref >=60)

## 2023-10-05 LAB — SEDIMENTATION RATE: Sed Rate: 39 mm/h — ABNORMAL HIGH (ref 0–20)

## 2023-10-05 LAB — C-REACTIVE PROTEIN: CRP: 11.2 mg/L — ABNORMAL HIGH (ref <8.0)

## 2023-10-07 ENCOUNTER — Ambulatory Visit

## 2023-10-07 ENCOUNTER — Ambulatory Visit (INDEPENDENT_AMBULATORY_CARE_PROVIDER_SITE_OTHER)

## 2023-10-07 VITALS — BP 100/63 | HR 67 | Temp 97.8°F | Ht 71.0 in | Wt 188.0 lb

## 2023-10-07 DIAGNOSIS — J90 Pleural effusion, not elsewhere classified: Secondary | ICD-10-CM | POA: Diagnosis not present

## 2023-10-07 DIAGNOSIS — J869 Pyothorax without fistula: Secondary | ICD-10-CM

## 2023-10-07 LAB — CBC WITH DIFFERENTIAL/PLATELET
Basophils Absolute: 0 K/uL (ref 0.0–0.1)
Basophils Relative: 0.4 % (ref 0.0–3.0)
Eosinophils Absolute: 0.1 K/uL (ref 0.0–0.7)
Eosinophils Relative: 0.6 % (ref 0.0–5.0)
HCT: 32.9 % — ABNORMAL LOW (ref 39.0–52.0)
Hemoglobin: 11 g/dL — ABNORMAL LOW (ref 13.0–17.0)
Lymphocytes Relative: 10.7 % — ABNORMAL LOW (ref 12.0–46.0)
Lymphs Abs: 1 K/uL (ref 0.7–4.0)
MCHC: 33.5 g/dL (ref 30.0–36.0)
MCV: 89.3 fl (ref 78.0–100.0)
Monocytes Absolute: 0.5 K/uL (ref 0.1–1.0)
Monocytes Relative: 5.4 % (ref 3.0–12.0)
Neutro Abs: 7.6 K/uL (ref 1.4–7.7)
Neutrophils Relative %: 82.9 % — ABNORMAL HIGH (ref 43.0–77.0)
Platelets: 249 K/uL (ref 150.0–400.0)
RBC: 3.68 Mil/uL — ABNORMAL LOW (ref 4.22–5.81)
RDW: 15.6 % — ABNORMAL HIGH (ref 11.5–15.5)
WBC: 9.1 K/uL (ref 4.0–10.5)

## 2023-10-07 LAB — C-REACTIVE PROTEIN: CRP: 1.5 mg/dL (ref 0.5–20.0)

## 2023-10-07 NOTE — Patient Instructions (Signed)
 It was nice seeing you you again in the clinic today.  Your chest x-ray looks very reassuring.  The infection looks much improved.  He still have a small residual scar that would likely stay there forever.  I will get some blood work to make sure inflammatory markers are down.  Continue to follow-up with your primary care physician  Follow-up as needed.  Please reach out to us  if you have any questions or concerns

## 2023-10-07 NOTE — Progress Notes (Signed)
 Subjective:   PATIENT ID: Jacob [name] GENDER: male DOB: 01-03-1951, MRN: [id number]   HPI 73 year old male with a past medical history of coronary artery disease, hypertension, Crohn's disease not on therapy, peripheral arterial disease who presented to the emergency room due to A-fib RVR.  Workup including CT scan of the chest performed showed pneumonia with a pleural effusion.  Thoracentesis performed on 09/02/2023 with findings concerning for empyema with pleural fluid growing MSSA bacteremia.  Subsequently a chest tube was placed and he was started on lytic therapy.  Patient had a lot of pain symptoms related to tube placement and lytic therapy and after 3 doses of lytic therapy it was noted that he had more bloody output with a drop in his hemoglobin and mild hypotension.  He was monitored closely with repeat hemoglobin checks and fluid output checks.  He had a repeat CT scan on August 19 which showed some improvement in his complicated pleural effusion but continued to have at least 2 residual pockets that were not drained.  He was deemed not a good candidate for VATS.  Subsequently he was discharged on a prolonged course of antibiotics.  He was seen in the infectious disease clinic 3 days ago and was noted to be having ongoing referred chest pain.  He was continuing his Amoxiclav till the end of the week.  Patient is in the clinic today, denies any fevers however states that he has some chills and night sweats that he had a week ago.  He states that he is eating well.  He is complaining of new onset left-sided back pain.  He states that he has no problems on the right side.  He denies any coughing or sputum production.  He denies any problems with shortness of breath.  I repeated a bedside ultrasound today which shows improvement in his pleural effusion, there is a residual complex appearing very small pocket that is too small to drain.   Past Medical History:  Diagnosis Date   CAD  (coronary artery disease), native coronary artery     multivessel ASCAD s/p CABG and s/p PCI of SVG to RI 2008   Carotid artery stenosis    patent right CEA site and 1-39% Lcarotid stenosis   Crohn's disease (HCC)    flare 2000   Depression    Dyslipidemia    Dyspnea    ED (erectile dysfunction)    History of blood transfusion 2005   Hypertension    Myocardial infarction (HCC) 2005   5 bypasses   Nephrolithiasis    Stroke (HCC)    TIA- Endartarectemy R side   TIA (transient ischemic attack)    Ulcerative colitis (HCC)    Left sided     Family History  Problem Relation Age of Onset   Ovarian cancer Mother    CAD Father    CAD Sister    CAD Brother    CAD Brother    CAD Brother    Colon cancer Neg Hx    Esophageal cancer Neg Hx    Stomach cancer Neg Hx    Rectal cancer Neg Hx    Colon polyps Neg Hx      Social History   Socioeconomic History   Marital status: Married    Spouse name: Not on file   Number of children: 5   Years of education: Not on file   Highest education level: Not on file  Occupational History   Occupation: Retired   Tobacco  Use   Smoking status: Never   Smokeless tobacco: Former    Types: Snuff  Vaping Use   Vaping status: Never Used  Substance and Sexual Activity   Alcohol use: No   Drug use: No   Sexual activity: Not on file  Other Topics Concern   Not on file  Social History Narrative   Are you right handed or left handed? Right Handed   Are you currently employed ?    What is your current occupation?   Do you live at home alone? No    Who lives with you? Wife, grandson, and adopted son.    What type of home do you live in: 1 story or 2 story? Lives in a one story home       Social Drivers of Health   Financial Resource Strain: Not on file  Food Insecurity: Food Insecurity Present (09/01/2023)   Hunger Vital Sign    Worried About Running Out of Food in the Last Year: Often true    Ran Out of Food in the Last Year: Often true   Transportation Needs: No Transportation Needs (09/01/2023)   PRAPARE - Administrator, Civil Service (Medical): No    Lack of Transportation (Non-Medical): No  Physical Activity: Not on file  Stress: Not on file  Social Connections: Socially Integrated (09/01/2023)   Social Connection and Isolation Panel    Frequency of Communication with Friends and Family: More than three times a week    Frequency of Social Gatherings with Friends and Family: More than three times a week    Attends Religious Services: More than 4 times per year    Active Member of [employer] or Organizations: Yes    Attends Engineer, structural: More than 4 times per year    Marital Status: Married  Catering manager Violence: Not At Risk (09/01/2023)   Humiliation, Afraid, Rape, and Kick questionnaire    Fear of Current or Ex-Partner: No    Emotionally Abused: No    Physically Abused: No    Sexually Abused: No     No Known Allergies   Outpatient Medications Prior to Visit  Medication Sig Dispense Refill   amiodarone  (PACERONE ) 200 MG tablet Take 1 tablet (200 mg total) by mouth 2 (two) times daily. 90 tablet 0   amLODipine  (NORVASC ) 5 MG tablet Take 1 tablet (5 mg total) by mouth daily. 90 tablet 0   aspirin  EC 81 MG tablet Take 81 mg by mouth daily. Swallow whole.     atorvastatin  (LIPITOR) 80 MG tablet Take 1 tablet (80 mg total) by mouth daily. 90 tablet 3   glucosamine-chondroitin 500-400 MG tablet Take 1 tablet by mouth 2 (two) times daily.     HYDROcodone -acetaminophen  (NORCO/VICODIN) 5-325 MG tablet Take 1 tablet by mouth every 8 (eight) hours as needed for severe pain (pain score 7-10). 20 tablet 0   latanoprost  (XALATAN ) 0.005 % ophthalmic solution Place 1 drop into both eyes at bedtime.     lidocaine  (SALONPAS PAIN RELIEVING) 4 % Place 1 patch onto the skin daily.     lisinopril  (ZESTRIL ) 20 MG tablet Take 1 tablet (20 mg total) by mouth daily. 90 tablet 3   sildenafil (VIAGRA) 100 MG  tablet Take 100 mg by mouth daily as needed for erectile dysfunction.     tamsulosin  (FLOMAX ) 0.4 MG CAPS capsule Take 0.4 mg by mouth at bedtime.     tiZANidine  (ZANAFLEX ) 4 MG tablet Take 1 tablet (  4 mg total) by mouth 3 (three) times daily. 30 tablet 0   vitamin B-12 (CYANOCOBALAMIN ) 500 MCG tablet Take 1,000 mcg by mouth daily.     No facility-administered medications prior to visit.    ROS Reviewed all systems and reported negative except as above     Objective:  There were no vitals filed for this visit.  Physical Exam General: Elderly male not in acute distress Chest: There is decreased air entry on the right side compared to the left side Abdomen: Soft, nontender Heart: Irregular, normal S1, normal S2 Extremities: Warm, well-perfused Neuro: Grossly intact    CBC    Component Value Date/Time   WBC 6.3 10/04/2023 1042   RBC 3.64 (L) 10/04/2023 1042   HGB 10.9 (L) 10/04/2023 1042   HCT 33.6 (L) 10/04/2023 1042   PLT 222 10/04/2023 1042   MCV 92.3 10/04/2023 1042   MCH 29.9 10/04/2023 1042   MCHC 32.4 10/04/2023 1042   RDW 13.5 10/04/2023 1042   LYMPHSABS 1.3 08/31/2023 1856   MONOABS 0.9 08/31/2023 1856   EOSABS 132 10/04/2023 1042   BASOSABS 57 10/04/2023 1042     Chest imaging: I reviewed his CT chest performed on 09/07/2023.  At that time he had residual loculated pockets of fluid around the left lung.  Chest x-ray today with some improvement in the irrigation of the left lung.  There is a persistent left-sided pleural effusion with some increased reticular markings around the left lung.    Assessment & Plan:   Assessment & Plan Empyema [facility]) Patient with MSSA empyema.  With residual pockets despite tPA DNase.  Chest x-ray appears reassuring.  Patient functionally doing better.  On a prolonged course of antibiotics.  Bedside ultrasound with no evidence of worsening pleural effusion.  Plan to obtain repeat CBC and CRP today.  Orders Placed This  Encounter  Procedures   DG Chest 2 View    Standing Status:   Future    Number of Occurrences:   1    Expiration Date:   10/06/2024    Reason for Exam (SYMPTOM  OR DIAGNOSIS REQUIRED):   f/u pleural effusion    Preferred imaging location?:   Internal      Zola Herter, MD [facility] Office: [phone]

## 2023-10-11 ENCOUNTER — Inpatient Hospital Stay (HOSPITAL_COMMUNITY)
Admission: RE | Admit: 2023-10-11 | Discharge: 2023-10-11 | Disposition: A | Discharge status: Home / Self Care | Source: Ambulatory Visit | Attending: Internal Medicine | Admitting: Internal Medicine

## 2023-10-11 ENCOUNTER — Ambulatory Visit (HOSPITAL_COMMUNITY)
Admit: 2023-10-11 | Discharge: 2023-10-11 | Disposition: A | Discharge status: Home / Self Care | Attending: Internal Medicine | Admitting: Internal Medicine

## 2023-10-11 VITALS — BP 100/60 | HR 67 | Ht 71.0 in | Wt 182.4 lb

## 2023-10-11 DIAGNOSIS — D6869 Other thrombophilia: Secondary | ICD-10-CM

## 2023-10-11 DIAGNOSIS — Z5181 Encounter for therapeutic drug level monitoring: Secondary | ICD-10-CM | POA: Diagnosis not present

## 2023-10-11 DIAGNOSIS — I480 Paroxysmal atrial fibrillation: Secondary | ICD-10-CM | POA: Diagnosis not present

## 2023-10-11 DIAGNOSIS — Z79899 Other long term (current) drug therapy: Secondary | ICD-10-CM

## 2023-10-11 DIAGNOSIS — I4891 Unspecified atrial fibrillation: Secondary | ICD-10-CM

## 2023-10-11 MED ORDER — AMIODARONE HCL 200 MG PO TABS: 200.0000 mg | ORAL_TABLET | Freq: Two times a day (BID) | ORAL | 1 refills | Status: DC

## 2023-10-11 MED ORDER — AMIODARONE HCL 200 MG PO TABS: 200.0000 mg | ORAL_TABLET | Freq: Every day | ORAL | 1 refills | Status: DC

## 2023-10-11 NOTE — Patient Instructions (Signed)
 Wear monitor for 14 days and remove place in box and send off in the mail.  We will call you for follow up   Decrease amiodarone  to 200 mg daily

## 2023-10-11 NOTE — Progress Notes (Signed)
 Primary Care Physician: [name], MD Primary Cardiologist: Wilbert Bihari, MD Electrophysiologist: None     Referring Physician: Jonel Lonni SQUIBB, MD     Jacob [name] is a 73 y.o. male with a history of CAD s/p CABG 2005 and PCI to SVG to RI in 2008, HTN, HLD, carotid artery stenosis s/p endarterectomy 2018, prior TIA, prior stroke, Crohn's disease, ulcerative colitis, chronic back pain, and atrial fibrillation who presents for consultation in the [facility]. Hospital admission 8/12-22/2025 for MSSA empyema with new Afib with RVR. Converted to NSR on IV amiodarone  on 8/15. With history of frequent falls and Afib triggered by acute illness, suspect the risk of anticoagulation may outweigh the benefit. Consider live monitor off anticoagulation to help with risk/benefit discussion for future anticoagulation. Patient has a CHADS2VASC score of 5.  On evaluation today, patient is currently in NSR. He is currently taking amiodarone  200 mg BID. He has not had any falls since hospital discharge. He lives at home with his wife, son, and grandson.   Today, he denies symptoms of palpitations, chest pain, shortness of breath, orthopnea, PND, lower extremity edema, dizziness, presyncope, syncope, snoring, daytime somnolence, bleeding, or neurologic sequela. The patient is tolerating medications without difficulties and is otherwise without complaint today.   he has a BMI of Body mass index is 25.44 kg/m.SABRA Filed Weights   10/11/23 1106  Weight: 82.7 kg    Current Outpatient Medications  Medication Sig Dispense Refill   amiodarone  (PACERONE ) 200 MG tablet Take 1 tablet (200 mg total) by mouth 2 (two) times daily. 90 tablet 0   aspirin  EC 81 MG tablet Take 81 mg by mouth daily. Swallow whole.     atorvastatin  (LIPITOR) 80 MG tablet Take 1 tablet (80 mg total) by mouth daily. 90 tablet 3   glucosamine-chondroitin 500-400 MG tablet Take 1 tablet by mouth 2  (two) times daily.     HYDROcodone -acetaminophen  (NORCO/VICODIN) 5-325 MG tablet Take 1 tablet by mouth every 8 (eight) hours as needed for severe pain (pain score 7-10). 20 tablet 0   latanoprost  (XALATAN ) 0.005 % ophthalmic solution Place 1 drop into both eyes at bedtime.     lidocaine  (SALONPAS PAIN RELIEVING) 4 % Place 1 patch onto the skin daily.     metoprolol  succinate (TOPROL -XL) 25 MG 24 hr tablet  (Patient taking differently: Take 25 mg by mouth daily.)     oxyCODONE -acetaminophen  (PERCOCET/ROXICET) 5-325 MG tablet Take 1 tablet by mouth 4 (four) times daily as needed.     sildenafil (VIAGRA) 100 MG tablet Take 100 mg by mouth daily as needed for erectile dysfunction.     tamsulosin  (FLOMAX ) 0.4 MG CAPS capsule Take 0.4 mg by mouth at bedtime.     tiZANidine  (ZANAFLEX ) 4 MG tablet Take 1 tablet (4 mg total) by mouth 3 (three) times daily. 30 tablet 0   vitamin B-12 (CYANOCOBALAMIN ) 500 MCG tablet Take 1,000 mcg by mouth daily.     [Paused] amLODipine  (NORVASC ) 5 MG tablet Take 1 tablet (5 mg total) by mouth daily. (Patient not taking: Reported on 10/11/2023) 90 tablet 0   [Paused] lisinopril  (ZESTRIL ) 20 MG tablet Take 1 tablet (20 mg total) by mouth daily. (Patient not taking: Reported on 10/11/2023) 90 tablet 3   No current facility-administered medications for this encounter.    Atrial Fibrillation Management history:  Previous antiarrhythmic drugs: amiodarone  Previous cardioversions: none Previous ablations: none Anticoagulation history: none   ROS- All systems are  reviewed and negative except as per the HPI above.  Physical Exam: BP 100/60   Pulse 67   Ht 5' 11 (1.803 m)   Wt 82.7 kg   BMI 25.44 kg/m   GEN: Well nourished, well developed in no acute distress NECK: No JVD; No carotid bruits CARDIAC: Regular rate and rhythm, no murmurs, rubs, gallops RESPIRATORY:  Clear to auscultation without rales, wheezing or rhonchi  ABDOMEN: Soft, non-tender,  non-distended EXTREMITIES:  No edema; No deformity   EKG today demonstrates  Vent. rate 67 BPM PR interval 154 ms QRS duration 92 ms QT/QTcB 412/435 ms P-R-T axes 40 19 -27 Normal sinus rhythm Minimal voltage criteria for LVH, may be normal variant ( R in aVL ) ST & T wave abnormality, consider inferior ischemia Abnormal ECG When compared with ECG of 03-Sep-2023 09:28, Previous ECG is present  Echo 09/03/23 demonstrated  1. Left ventricular ejection fraction, by estimation, is 55 to 60%. The  left ventricle has normal function. Left ventricular diastolic parameters  were normal.   2. Right ventricular systolic function is normal. The right ventricular  size is normal.   3. Mild mitral valve regurgitation.   4. The aortic valve is tricuspid. Aortic valve regurgitation is trivial.  Aortic valve sclerosis/calcification is present, without any evidence of  aortic stenosis.    ASSESSMENT & PLAN CHA2DS2-VASc Score = 5  The patient's score is based upon: CHF History: 0 HTN History: 1 Diabetes History: 0 Stroke History: 2 Vascular Disease History: 1 Age Score: 1 Gender Score: 0       ASSESSMENT AND PLAN: Paroxysmal Atrial Fibrillation (ICD10:  I48.0) The patient's CHA2DS2-VASc score is 5, indicating a 7.2% annual risk of stroke.    Patient is currently in NSR. He has loaded on amiodarone  for the past month so will decrease dose. Review of Dr. Marko notes while in hospital - will place Zio to assess burden for 2 weeks. This can help determine plan for anticoagulation long term. Will review results with EP.   Secondary Hypercoagulable State (ICD10:  D68.69) The patient is at significant risk for stroke/thromboembolism based upon his CHA2DS2-VASc Score of 5.  We discussed the reasoning behind anticoagulation in the setting of stroke prevention related to Afib. We discussed the benefits vs risks of anticoagulation. After discussion, patient is interested in considering  anticoagulation to prevent recurrent stroke if that is the decision made by the team.Will continue ASA for now.   High risk medication monitoring (ICD10: J342684) Patient requires ongoing monitoring for anti-arrhythmic medication which has the potential to cause life threatening arrhythmias or AV block. Qtc stable. Decrease amiodarone  to 200 mg once daily.    Follow up will be determined based on monitor.    Terra Pac, [facility]  Afib Clinic [address] [place], [place] [address] [phone]

## 2023-10-18 ENCOUNTER — Ambulatory Visit: Payer: Self-pay

## 2023-10-18 NOTE — Telephone Encounter (Signed)
   FYI Only or Action Required?: FYI only for provider.  Patient is followed in Pulmonology for Empyema, last seen on 10/07/2023 by Hattar, Zola SAILOR, MD.  Called Nurse Triage reporting Medication Refill.  Symptoms began about a month ago.  Interventions attempted: Prescription medications: unknown abx name.  Symptoms are: unchanged.  Triage Disposition: See PCP When Office is Open (Within 3 Days)  Patient/caregiver understands and will follow disposition?: Yes         Copied from CRM #[id number]. Topic: Clinical - Medication Question >> Oct 18, 2023 12:37 PM Essie A wrote: Reason for CRM: Patient says he took an antibiotic medicine for fluid in his lungs.  He doesn't know the name of the medication and his wife threw away the bottle because it said no refills.  He said it got rid of the fluid on the right side but not the left side.  He would like a higher dose of this medication.  Please return his call at [phone] to let him know if he can get a refill.  Thanks. Reason for Disposition  Prescription request for new medicine (not a refill)    Pt reports recent hospitalization for lung infection and saw Dr. Zaida 2 weeks ago. Pt reports that he ran out of abx, but is unsure what the name is. Pt has tried to reach out to PCP for refill, but was advised to reach out to [facility].  Pt denies any worsening of sx. He endorses that the infection has gone from his R side to L side-- unknown how this information was obtained from pt. Scheduled with alternate provider at [facility].  Answer Assessment - Initial Assessment Questions 1. DRUG NAME: What medicine do you need to have refilled?     unknown 2. REFILLS REMAINING: How many refills are remaining? Notes: The label on the medicine or pill bottle will show how many refills are remaining. If there are no refills remaining, then a renewal may be needed.     0 3. EXPIRATION DATE: What is the expiration date? Note: The label states when the  prescription will expire, and thus can no longer be refilled.)     N/a 4. PRESCRIBER: Who prescribed it? Note: The prescribing doctor or group is responsible for refill approvals..     Does not recall-- endorsed calling PCP and was instructed call [facility] 5. PHARMACY: Have you contacted your pharmacy (drugstore)? Note: Some pharmacies will contact the doctor (or NP/PA).      N/a 6. SYMPTOMS: Do you have any symptoms?     Denies SOB, but endorses a staph infection in my back. Endorses went from R side to L side. Endorses L shoulder pain. 7. PREGNANCY: Is there any chance that you are pregnant? When was your last menstrual period?     N/a  Protocols used: Medication Refill and Renewal Call-A-AH

## 2023-10-19 ENCOUNTER — Encounter (HOSPITAL_COMMUNITY): Payer: Self-pay | Admitting: *Deleted

## 2023-10-19 ENCOUNTER — Emergency Department (HOSPITAL_COMMUNITY)

## 2023-10-19 ENCOUNTER — Encounter: Payer: Self-pay | Admitting: Internal Medicine

## 2023-10-19 ENCOUNTER — Other Ambulatory Visit: Payer: Self-pay

## 2023-10-19 ENCOUNTER — Ambulatory Visit

## 2023-10-19 ENCOUNTER — Ambulatory Visit: Admitting: Internal Medicine

## 2023-10-19 ENCOUNTER — Inpatient Hospital Stay (HOSPITAL_COMMUNITY)
Admission: EM | Admit: 2023-10-19 | Discharge: 2023-10-26 | DRG: 478 | Disposition: A | Discharge status: Home Health | Attending: Student | Admitting: Student

## 2023-10-19 VITALS — BP 120/58 | HR 70 | Ht 71.0 in | Wt 179.0 lb

## 2023-10-19 DIAGNOSIS — F32A Depression, unspecified: Secondary | ICD-10-CM | POA: Diagnosis present

## 2023-10-19 DIAGNOSIS — Z8041 Family history of malignant neoplasm of ovary: Secondary | ICD-10-CM | POA: Diagnosis not present

## 2023-10-19 DIAGNOSIS — I2510 Atherosclerotic heart disease of native coronary artery without angina pectoris: Secondary | ICD-10-CM | POA: Diagnosis present

## 2023-10-19 DIAGNOSIS — E611 Iron deficiency: Secondary | ICD-10-CM | POA: Diagnosis present

## 2023-10-19 DIAGNOSIS — Y92009 Unspecified place in unspecified non-institutional (private) residence as the place of occurrence of the external cause: Secondary | ICD-10-CM

## 2023-10-19 DIAGNOSIS — I252 Old myocardial infarction: Secondary | ICD-10-CM

## 2023-10-19 DIAGNOSIS — Z8673 Personal history of transient ischemic attack (TIA), and cerebral infarction without residual deficits: Secondary | ICD-10-CM

## 2023-10-19 DIAGNOSIS — E559 Vitamin D deficiency, unspecified: Secondary | ICD-10-CM | POA: Diagnosis present

## 2023-10-19 DIAGNOSIS — J869 Pyothorax without fistula: Secondary | ICD-10-CM | POA: Diagnosis not present

## 2023-10-19 DIAGNOSIS — D649 Anemia, unspecified: Secondary | ICD-10-CM | POA: Diagnosis present

## 2023-10-19 DIAGNOSIS — Z79899 Other long term (current) drug therapy: Secondary | ICD-10-CM | POA: Diagnosis not present

## 2023-10-19 DIAGNOSIS — K5900 Constipation, unspecified: Secondary | ICD-10-CM | POA: Diagnosis present

## 2023-10-19 DIAGNOSIS — Z8249 Family history of ischemic heart disease and other diseases of the circulatory system: Secondary | ICD-10-CM

## 2023-10-19 DIAGNOSIS — E538 Deficiency of other specified B group vitamins: Secondary | ICD-10-CM | POA: Diagnosis present

## 2023-10-19 DIAGNOSIS — M869 Osteomyelitis, unspecified: Secondary | ICD-10-CM | POA: Diagnosis not present

## 2023-10-19 DIAGNOSIS — E785 Hyperlipidemia, unspecified: Secondary | ICD-10-CM | POA: Diagnosis present

## 2023-10-19 DIAGNOSIS — Z87442 Personal history of urinary calculi: Secondary | ICD-10-CM

## 2023-10-19 DIAGNOSIS — I480 Paroxysmal atrial fibrillation: Secondary | ICD-10-CM | POA: Diagnosis present

## 2023-10-19 DIAGNOSIS — W19XXXA Unspecified fall, initial encounter: Secondary | ICD-10-CM | POA: Diagnosis present

## 2023-10-19 DIAGNOSIS — M75112 Incomplete rotator cuff tear or rupture of left shoulder, not specified as traumatic: Secondary | ICD-10-CM | POA: Diagnosis not present

## 2023-10-19 DIAGNOSIS — Z5941 Food insecurity: Secondary | ICD-10-CM

## 2023-10-19 DIAGNOSIS — Z452 Encounter for adjustment and management of vascular access device: Secondary | ICD-10-CM

## 2023-10-19 DIAGNOSIS — I38 Endocarditis, valve unspecified: Secondary | ICD-10-CM | POA: Diagnosis not present

## 2023-10-19 DIAGNOSIS — M4644 Discitis, unspecified, thoracic region: Principal | ICD-10-CM | POA: Diagnosis present

## 2023-10-19 DIAGNOSIS — S46012A Strain of muscle(s) and tendon(s) of the rotator cuff of left shoulder, initial encounter: Secondary | ICD-10-CM | POA: Diagnosis present

## 2023-10-19 DIAGNOSIS — M4624 Osteomyelitis of vertebra, thoracic region: Principal | ICD-10-CM | POA: Diagnosis present

## 2023-10-19 DIAGNOSIS — Z951 Presence of aortocoronary bypass graft: Secondary | ICD-10-CM

## 2023-10-19 DIAGNOSIS — B9561 Methicillin susceptible Staphylococcus aureus infection as the cause of diseases classified elsewhere: Secondary | ICD-10-CM | POA: Diagnosis present

## 2023-10-19 DIAGNOSIS — M25512 Pain in left shoulder: Secondary | ICD-10-CM | POA: Diagnosis not present

## 2023-10-19 DIAGNOSIS — J90 Pleural effusion, not elsewhere classified: Secondary | ICD-10-CM | POA: Diagnosis present

## 2023-10-19 DIAGNOSIS — Z87891 Personal history of nicotine dependence: Secondary | ICD-10-CM | POA: Diagnosis not present

## 2023-10-19 DIAGNOSIS — Z7982 Long term (current) use of aspirin: Secondary | ICD-10-CM | POA: Diagnosis not present

## 2023-10-19 DIAGNOSIS — M4645 Discitis, unspecified, thoracolumbar region: Secondary | ICD-10-CM | POA: Diagnosis not present

## 2023-10-19 DIAGNOSIS — I10 Essential (primary) hypertension: Secondary | ICD-10-CM | POA: Diagnosis present

## 2023-10-19 DIAGNOSIS — Z5948 Other specified lack of adequate food: Secondary | ICD-10-CM

## 2023-10-19 LAB — BASIC METABOLIC PANEL WITH GFR
Anion gap: 12 (ref 5–15)
BUN: 19 mg/dL (ref 8–23)
CO2: 21 mmol/L — ABNORMAL LOW (ref 22–32)
Calcium: 8.8 mg/dL — ABNORMAL LOW (ref 8.9–10.3)
Chloride: 102 mmol/L (ref 98–111)
Creatinine, Ser: 1.14 mg/dL (ref 0.61–1.24)
GFR, Estimated: >60 mL/min (ref >60)
Glucose, Bld: 91 mg/dL (ref 70–99)
Potassium: 3.8 mmol/L (ref 3.5–5.1)
Sodium: 135 mmol/L (ref 135–145)

## 2023-10-19 LAB — CBC WITH DIFFERENTIAL/PLATELET
Abs Immature Granulocytes: 0.05 K/uL (ref 0.00–0.07)
Basophils Absolute: 0 K/uL (ref 0.0–0.1)
Basophils Relative: 0 %
Eosinophils Absolute: 0 K/uL (ref 0.0–0.5)
Eosinophils Relative: 0 %
HCT: 30.6 % — ABNORMAL LOW (ref 39.0–52.0)
Hemoglobin: 9.6 g/dL — ABNORMAL LOW (ref 13.0–17.0)
Immature Granulocytes: 1 %
Lymphocytes Relative: 15 %
Lymphs Abs: 1.1 K/uL (ref 0.7–4.0)
MCH: 28.6 pg (ref 26.0–34.0)
MCHC: 31.4 g/dL (ref 30.0–36.0)
MCV: 91.1 fL (ref 80.0–100.0)
Monocytes Absolute: 0.6 K/uL (ref 0.1–1.0)
Monocytes Relative: 8 %
Neutro Abs: 5.8 K/uL (ref 1.7–7.7)
Neutrophils Relative %: 76 %
Platelets: 339 K/uL (ref 150–400)
RBC: 3.36 MIL/uL — ABNORMAL LOW (ref 4.22–5.81)
RDW: 14.1 % (ref 11.5–15.5)
WBC: 7.7 K/uL (ref 4.0–10.5)
nRBC: 0 % (ref 0.0–0.2)

## 2023-10-19 LAB — SEDIMENTATION RATE: Sed Rate: 95 mm/h — ABNORMAL HIGH (ref 0–16)

## 2023-10-19 MED ORDER — ACETAMINOPHEN 325 MG PO TABS: 650.0000 mg | ORAL_TABLET | Freq: Four times a day (QID) | ORAL | Status: DC | PRN

## 2023-10-19 MED ORDER — HEPARIN SODIUM (PORCINE) 5000 UNIT/ML IJ SOLN
5000.0000 [IU] | Freq: Three times a day (TID) | INTRAMUSCULAR | Status: DC
  Administered 2023-10-20 – 2023-10-23 (×11): 5000 [IU] via SUBCUTANEOUS
  Filled 2023-10-19 (×11): qty 1

## 2023-10-19 MED ORDER — METOPROLOL SUCCINATE ER 25 MG PO TB24
25.0000 mg | ORAL_TABLET | Freq: Every day | ORAL | Status: DC
  Administered 2023-10-21 – 2023-10-26 (×5): 25 mg via ORAL
  Filled 2023-10-19 (×5): qty 1

## 2023-10-19 MED ORDER — SENNOSIDES-DOCUSATE SODIUM 8.6-50 MG PO TABS: 1.0000 | ORAL_TABLET | Freq: Every evening | ORAL | Status: DC | PRN

## 2023-10-19 MED ORDER — TAMSULOSIN HCL 0.4 MG PO CAPS
0.4000 mg | ORAL_CAPSULE | Freq: Every day | ORAL | Status: DC
  Administered 2023-10-20 – 2023-10-25 (×7): 0.4 mg via ORAL
  Filled 2023-10-19 (×7): qty 1

## 2023-10-19 MED ORDER — HYDROCODONE-ACETAMINOPHEN 5-325 MG PO TABS
1.0000 | ORAL_TABLET | ORAL | Status: DC | PRN
  Administered 2023-10-20: 1 via ORAL
  Filled 2023-10-19: qty 1

## 2023-10-19 MED ORDER — SODIUM CHLORIDE 0.9% FLUSH
3.0000 mL | Freq: Two times a day (BID) | INTRAVENOUS | Status: DC
  Administered 2023-10-20 – 2023-10-26 (×13): 3 mL via INTRAVENOUS

## 2023-10-19 MED ORDER — BISACODYL 5 MG PO TBEC: 5.0000 mg | DELAYED_RELEASE_TABLET | Freq: Every day | ORAL | Status: DC | PRN

## 2023-10-19 MED ORDER — ONDANSETRON HCL 4 MG PO TABS: 4.0000 mg | ORAL_TABLET | Freq: Four times a day (QID) | ORAL | Status: DC | PRN

## 2023-10-19 MED ORDER — CEFAZOLIN SODIUM-DEXTROSE 1-4 GM/50ML-% IV SOLN
1.0000 g | Freq: Once | INTRAVENOUS | Status: AC
  Administered 2023-10-19: 1 g via INTRAVENOUS
  Filled 2023-10-19: qty 50

## 2023-10-19 MED ORDER — ATORVASTATIN CALCIUM 80 MG PO TABS
80.0000 mg | ORAL_TABLET | Freq: Every day | ORAL | Status: DC
  Administered 2023-10-20 – 2023-10-26 (×7): 80 mg via ORAL
  Filled 2023-10-19 (×7): qty 1

## 2023-10-19 MED ORDER — ACETAMINOPHEN 650 MG RE SUPP: 650.0000 mg | Freq: Four times a day (QID) | RECTAL | Status: DC | PRN

## 2023-10-19 MED ORDER — ONDANSETRON HCL 4 MG/2ML IJ SOLN: 4.0000 mg | Freq: Four times a day (QID) | INTRAMUSCULAR | Status: DC | PRN

## 2023-10-19 MED ORDER — AMIODARONE HCL 200 MG PO TABS
200.0000 mg | ORAL_TABLET | Freq: Every day | ORAL | Status: DC
  Administered 2023-10-20 – 2023-10-26 (×7): 200 mg via ORAL
  Filled 2023-10-19 (×7): qty 1

## 2023-10-19 MED ORDER — GADOBUTROL 1 MMOL/ML IV SOLN
8.0000 mL | Freq: Once | INTRAVENOUS | Status: AC | PRN
Start: 2023-10-19 — End: 2023-10-19
  Administered 2023-10-19: 8 mL via INTRAVENOUS

## 2023-10-19 MED ORDER — CEFAZOLIN SODIUM-DEXTROSE 2-4 GM/100ML-% IV SOLN
2.0000 g | Freq: Three times a day (TID) | INTRAVENOUS | Status: DC
  Administered 2023-10-20 – 2023-10-26 (×20): 2 g via INTRAVENOUS
  Filled 2023-10-19 (×21): qty 100

## 2023-10-19 MED ORDER — MORPHINE SULFATE (PF) 2 MG/ML IV SOLN
2.0000 mg | INTRAVENOUS | Status: DC | PRN
  Administered 2023-10-19 – 2023-10-20 (×2): 2 mg via INTRAVENOUS
  Filled 2023-10-19 (×2): qty 1

## 2023-10-19 MED ORDER — LORAZEPAM 2 MG/ML IJ SOLN: 1.0000 mg | Freq: Once | INTRAMUSCULAR | Status: DC | PRN

## 2023-10-19 MED ORDER — ASPIRIN 81 MG PO TBEC
81.0000 mg | DELAYED_RELEASE_TABLET | Freq: Every day | ORAL | Status: DC
  Administered 2023-10-20 – 2023-10-26 (×7): 81 mg via ORAL
  Filled 2023-10-19 (×7): qty 1

## 2023-10-19 MED ORDER — ONDANSETRON HCL 4 MG/2ML IJ SOLN
4.0000 mg | Freq: Once | INTRAMUSCULAR | Status: AC
  Administered 2023-10-19: 4 mg via INTRAVENOUS
  Filled 2023-10-19: qty 2

## 2023-10-19 MED ORDER — TIZANIDINE HCL 2 MG PO TABS
4.0000 mg | ORAL_TABLET | Freq: Three times a day (TID) | ORAL | Status: DC | PRN
  Administered 2023-10-20 – 2023-10-23 (×7): 4 mg via ORAL
  Filled 2023-10-19 (×3): qty 2
  Filled 2023-10-19: qty 1
  Filled 2023-10-19 (×3): qty 2

## 2023-10-19 MED ORDER — MORPHINE SULFATE (PF) 4 MG/ML IV SOLN
4.0000 mg | Freq: Once | INTRAVENOUS | Status: AC
  Administered 2023-10-19: 4 mg via INTRAVENOUS
  Filled 2023-10-19: qty 1

## 2023-10-19 NOTE — Hospital Course (Signed)
 Jacob [name] is a 73 y.o. male with medical history significant for CAD s/p CABG, CAS s/p right CEA, PAF not on AC, HTN, Crohn's disease, right MSSA empyema who is admitted with acute osteomyelitis discitis of T11-T12.

## 2023-10-19 NOTE — ED Notes (Signed)
 Patient transported to MRI

## 2023-10-19 NOTE — Progress Notes (Addendum)
 Jacob [name], male    DOB: [birth year]   MRN: [id number]   Brief patient profile:  45 yowm never smoker  referred to pulmonary clinic 10/19/2023 by Dr Malcolm  for L effusion        10/07/23 [facility] eval: Patient with MSSA empyema. With residual pockets despite tPA DNase. Chest x-ray appears reassuring. Patient functionally doing better. On a prolonged course of antibiotics. Bedside ultrasound with no evidence of worsening pleural effusion and L shoulder pain  but no fever.  Completed amox 10/13/23 and feel a lot worse sinc     History of Present Illness  10/19/2023  Pulmonary/ 1st office eval/Evona Westra  Chief Complaint  Patient presents with   Acute Visit    Left side pain, dizziness.   Dyspnea:  using a 2 wheeled walker (was using cane)  Cough: mucus is grey/ hurts in L shoulder to cough  Sleep: bed is flat with pillows under head  SABA use: none  02 ldz:[id number]   No obvious day to day or daytime pattern/variability or assoc excess/ purulent sputum or mucus plugs or hemoptysis or cp or chest tightness, subjective wheeze or overt sinus or hb symptoms.    Also denies any obvious fluctuation of symptoms with weather or environmental changes or other aggravating or alleviating factors except as outlined above   No unusual exposure hx or h/o childhood pna/ asthma or knowledge of premature birth.  Current Allergies, Complete Past Medical History, Past Surgical History, Family History, and Social History were reviewed in [employer] record.  ROS  The following are not active complaints unless bolded Hoarseness, sore throat, dysphagia, dental problems, itching, sneezing,  nasal congestion or discharge of excess mucus or purulent secretions, ear ache,   fever, chills, sweats, unintended wt loss or wt gain, classically pleuritic or exertional cp,  orthopnea pnd or arm/hand swelling  or leg swelling, presyncope, palpitations, abdominal pain, anorexia, nausea, vomiting, diarrhea   or change in bowel habits or change in bladder habits, change in stools or change in urine, dysuria, hematuria,  rash, arthralgias, visual complaints, headache, numbness, weakness or ataxia or problems with walking or coordination,  change in mood or  memory.             Outpatient Medications Prior to Visit  Medication Sig Dispense Refill   amiodarone  (PACERONE ) 200 MG tablet Take 1 tablet (200 mg total) by mouth daily. 90 tablet 1   amLODipine  (NORVASC ) 5 MG tablet Take 1 tablet (5 mg total) by mouth daily. 90 tablet 0   aspirin  EC 81 MG tablet Take 81 mg by mouth daily. Swallow whole.     atorvastatin  (LIPITOR) 80 MG tablet Take 1 tablet (80 mg total) by mouth daily. 90 tablet 3   glucosamine-chondroitin 500-400 MG tablet Take 1 tablet by mouth 2 (two) times daily.     HYDROcodone -acetaminophen  (NORCO/VICODIN) 5-325 MG tablet Take 1 tablet by mouth every 8 (eight) hours as needed for severe pain (pain score 7-10). 20 tablet 0   latanoprost  (XALATAN ) 0.005 % ophthalmic solution Place 1 drop into both eyes at bedtime.     lidocaine  (SALONPAS PAIN RELIEVING) 4 % Place 1 patch onto the skin daily.     lisinopril  (ZESTRIL ) 20 MG tablet Take 1 tablet (20 mg total) by mouth daily. 90 tablet 3   metoprolol  succinate (TOPROL -XL) 25 MG 24 hr tablet  (Patient taking differently: Take 25 mg by mouth daily.)     oxyCODONE -acetaminophen  (PERCOCET/ROXICET) 5-325 MG tablet  Take 1 tablet by mouth 4 (four) times daily as needed.     sildenafil (VIAGRA) 100 MG tablet Take 100 mg by mouth daily as needed for erectile dysfunction.     tamsulosin  (FLOMAX ) 0.4 MG CAPS capsule Take 0.4 mg by mouth at bedtime.     tiZANidine  (ZANAFLEX ) 4 MG tablet Take 1 tablet (4 mg total) by mouth 3 (three) times daily. 30 tablet 0   vitamin B-12 (CYANOCOBALAMIN ) 500 MCG tablet Take 1,000 mcg by mouth daily.     No facility-administered medications prior to visit.    Past Medical History:  Diagnosis Date   CAD (coronary  artery disease), native coronary artery     multivessel ASCAD s/p CABG and s/p PCI of SVG to RI 2008   Carotid artery stenosis    patent right CEA site and 1-39% Lcarotid stenosis   Crohn's disease (HCC)    flare 2000   Depression    Dyslipidemia    Dyspnea    ED (erectile dysfunction)    History of blood transfusion 2005   Hypertension    Myocardial infarction (HCC) 2005   5 bypasses   Nephrolithiasis    Stroke (HCC)    TIA- Endartarectemy R side   TIA (transient ischemic attack)    Ulcerative colitis (HCC)    Left sided      Objective:     BP (!) 120/58   Pulse 70   Ht 5' 11 (1.803 m)   Wt 179 lb (81.2 kg)   SpO2 97% Comment: on  RA  BMI 24.97 kg/m   SpO2: 97 % (on  RA)   chronically ill hoarse wm nad using 2 wheeled  walker very frail   Wt Readings from Last 3 Encounters:  10/19/23 179 lb (81.2 kg)  10/11/23 182 lb 6.4 oz (82.7 kg)  10/07/23 188 lb (85.3 kg)     HEENT : Oropharynx  clear       NECK :  without  apparent JVD/ palpable Nodes/TM    LUNGS: no acc muscle use,  Nl contour chest  Decreased bs both bases    CV:  RRR  no s3 or murmur or increase in P2, and no edema   ABD:  soft and nontender   MS:   ext warm without deformities  - cannot move L shoulder much at all SKIN: warm and dry without lesions    NEURO:  alert, approp, nl sensorium with  no motor or cerebellar deficits apparent.         Assessment     Assessment & Plan Empyema lung (HCC) Complicated by osteomyelitis and likely septic arthitis and failure of both lytics and out pt rx  Dr Sisto not reviewed from 10/12/23 indicates serious deterioration in MRI and advised to go to ER so I did the same today and will notify Triage of our concerns.   F/u with PCCM as inpt   Discussed in detail all the  indications, usual  risks and alternatives  relative to the benefits with patient who agrees to proceed with w/u as outlined.            Each maintenance medication was  reviewed in detail including emphasizing most importantly the difference between maintenance and prns and under what circumstances the prns are to be triggered using an action plan format where appropriate.  Total time for H and P, chart review, counseling,  visit / same day charting = 43 ,om  AVS  Patient Instructions  Go to [facility] ER ASAP   Ozell America, MD 10/19/2023

## 2023-10-19 NOTE — ED Notes (Signed)
 Pt returned from MRI

## 2023-10-19 NOTE — ED Triage Notes (Addendum)
 Pt was seen by his MD, Dr Darlean and was told to come to ED due to continued fluid on his lungs, pt denies any sob, pain in ribs with coughing.   Note by Dr. Darlean from today: Empyema lung [facility]) Complicated by osteomyelitis and likely septic arthitis and failure of both lytics and out pt rx   Dr Sisto not reviewed from 10/12/23 indicates serious deterioration in MRI and advised to go to ER

## 2023-10-19 NOTE — H&P (Signed)
 History and Physical    Jacob [name] FMW:[id number] DOB: 01/10/1951 DOA: 10/19/2023  PCP: [name], MD  Patient coming from: Home  I have personally briefly reviewed patient's old medical records in [facility]  Chief Complaint: Back pain  HPI: Jacob [name] is a 73 y.o. male with medical history significant for CAD s/p CABG, CAS s/p right CEA, PAF not on AC, HTN, Crohn's disease, right MSSA empyema who presented to the ED for evaluation of T11-12 osteomyelitis discitis.  Patient was recently admitted 08/31/23-09/10/23 initially with new onset atrial fibrillation with RVR.  CT on admission showed a right pleural effusion.    Thoracentesis 8/14 consistent with empyema, pleural fluid culture grew MSSA.  Chest tube was placed on 8/15 and he was given lytics 8/15-8/18.   Night of 8/18 patient developed large-volume dark red drainage from his chest tube.  He had been on IV heparin  for atrial fibrillation which was discontinued.    Hemoglobin dropped to 6.6.  He received total 3 unit PRBC transfusion.  Hemoglobin stabilized and anticoagulation was not restarted due to the hemothorax.  He was continued on aspirin .    He received IV Ancef  in hospital and was discharged with 3 more weeks of oral antibiotics with Augmentin  per ID recommendation.  During hospitalization he was complaining of back pain.  He underwent MRI lumbar spine on 8/13 which showed degenerative disc disease of the spine.  He was seen by neurosurgery Dr. Darnella in hospital who recommended outpatient follow-up.  Patient had an outpatient MRI of the thoracic spine performed on 9/11.  Report was read on 9/19 and showed discitis-osteomyelitis at T11-12 with associated phlegmon extending into the right T11-12 neuroforamen.  T8-9 ventral spinal cord compression and central disc protrusion at T6-7 resulting in ventral spinal cord compression also noted.  Patient was seen in the pulmonology office earlier today.  Due  to concern for worsening back pain and left shoulder pain with abnormal prior MRI findings he was advised to come to the ED for further evaluation and management.  Patient reports persistent pain in the middle of his lower back.  Pain radiates to both hips but not down his legs.  He reports good strength in his lower extremities.  No numbness/tingling.  He reports some dyspnea with exertion and occasional cough productive of gray sputum.  He is also experiencing left shoulder pain, worse at his acromioclavicular joint with range of motion.  ED Course  Labs/Imaging on admission: I have personally reviewed following labs and imaging studies.  Initial vitals showed BP 135/70, pulse 67, RR 16, temp 98.2 F, SpO2 98% on room air.  Labs showed WBC 7.7, hemoglobin 9.6, platelets 339, sodium 135, potassium 3.8, bicarb 21, BUN 19, creatinine 1.14, serum glucose 91, ESR 95, CRP pending.  2 view chest x-ray showed low lung volumes with trace right pleural effusion.  Left shoulder x-ray negative for acute fracture or dislocation.  MRI thoracic spine with and without contrast IMPRESSION: 1. Findings consistent with acute osteomyelitis discitis at T11-12. No significant epidural involvement at this time. 2. Small right pleural effusion with 3.8 x 2.6 cm ovoid T1/T2 hyperintense lesion at the posterior right pleural space, nonspecific, but possibly a loculated complex effusion. Correlation with dedicated chest imaging suggested for further evaluation. 3. Underlying multilevel thoracic spondylosis as above without significant stenosis.  MRI lumbar spine with and without contrast IMPRESSION: 1. No MRI evidence for acute osteomyelitis discitis or septic arthritis within the lumbar spine. 2.  Diffuse edema within the posterior paraspinous musculature of the lower back, nonspecific, but could reflect changes of acute myositis. Possible muscular injury/strain could also be considered. No collections. 3.  Underlying multilevel degenerative spondylosis and facet arthrosis as above. No significant spinal stenosis. Mild to moderate bilateral L3 through L5 foraminal narrowing as above. 4. Changes of acute osteomyelitis discitis at T11-12, described on corresponding MRI of the thoracic spine.  Patient was given IV morphine  4 mg, IV Ancef .  EDP discussed with neurosurgery, Dr. Louis, who recommended admission for IV antibiotics with no current surgical indications.  EDP also discussed with pulmonology (Dr. Claudene) who felt empyema was improving.  The hospitalist service was consulted for admission.  Review of Systems: All systems reviewed and are negative except as documented in history of present illness above.   Past Medical History:  Diagnosis Date   CAD (coronary artery disease), native coronary artery     multivessel ASCAD s/p CABG and s/p PCI of SVG to RI 2008   Carotid artery stenosis    patent right CEA site and 1-39% Lcarotid stenosis   Crohn's disease (HCC)    flare 2000   Depression    Dyslipidemia    Dyspnea    ED (erectile dysfunction)    History of blood transfusion 2005   Hypertension    Myocardial infarction [facility]) 2005   5 bypasses   Nephrolithiasis    Stroke [facility])    TIA- Endartarectemy R side   TIA (transient ischemic attack)    Ulcerative colitis (HCC)    Left sided    Past Surgical History:  Procedure Laterality Date   CARDIAC CATHETERIZATION  06/08   Patnet grafts LAD, marginal, distal right   carpel tunnel surgery Left    COLONOSCOPY     COLONOSCOPY WITH PROPOFOL  N/A 10/25/2012   Procedure: COLONOSCOPY WITH PROPOFOL ;  Surgeon: Gladis MARLA Louder, MD;  Location: [facility] ENDOSCOPY;  Service: Endoscopy;  Laterality: N/A;   CORONARY ARTERY BYPASS GRAFT  2005   ASCVD,multivessel,S/P CABG  x5 total per patient   ENDARTERECTOMY Right 06/24/2016   Procedure: ENDARTERECTOMY CAROTID;  Surgeon: Marea Selinda RAMAN, MD;  Location: [facility] ORS;  Service: Vascular;  Laterality: Right;    HERNIA REPAIR  many years ago   IR THORACENTESIS ASP PLEURAL SPACE W/IMG GUIDE  09/02/2023   KNEE ARTHROSCOPY Right 1995    Social History: Social History   Tobacco Use   Smoking status: Never   Smokeless tobacco: Former    Types: Snuff  Vaping Use   Vaping status: Never Used  Substance Use Topics   Alcohol use: No   Drug use: No   No Known Allergies  Family History  Problem Relation Age of Onset   Ovarian cancer Mother    CAD Father    CAD Sister    CAD Brother    CAD Brother    CAD Brother    Colon cancer Neg Hx    Esophageal cancer Neg Hx    Stomach cancer Neg Hx    Rectal cancer Neg Hx    Colon polyps Neg Hx      Prior to Admission medications   Medication Sig Start Date End Date Taking? Authorizing Provider  amiodarone  (PACERONE ) 200 MG tablet Take 1 tablet (200 mg total) by mouth daily. 10/11/23   Terra Fairy PARAS, PA-C  amLODipine  (NORVASC ) 5 MG tablet Take 1 tablet (5 mg total) by mouth daily. 11/05/22   Lucien Orren SAILOR, PA-C  aspirin  EC 81 MG tablet  Take 81 mg by mouth daily. Swallow whole.    [provider]  atorvastatin  (LIPITOR) 80 MG tablet Take 1 tablet (80 mg total) by mouth daily. 05/24/23   Shlomo Wilbert SAUNDERS, MD  glucosamine-chondroitin 500-400 MG tablet Take 1 tablet by mouth 2 (two) times daily.    [provider]  HYDROcodone -acetaminophen  (NORCO/VICODIN) 5-325 MG tablet Take 1 tablet by mouth every 8 (eight) hours as needed for severe pain (pain score 7-10). 08/28/23   Charlene Debby BROCKS, PA-C  latanoprost  (XALATAN ) 0.005 % ophthalmic solution Place 1 drop into both eyes at bedtime. 07/19/23   [provider]  lidocaine  (SALONPAS PAIN RELIEVING) 4 % Place 1 patch onto the skin daily.    [provider]  lisinopril  (ZESTRIL ) 20 MG tablet Take 1 tablet (20 mg total) by mouth daily. 03/24/23   Lucien Orren SAILOR, PA-C  metoprolol  succinate (TOPROL -XL) 25 MG 24 hr tablet     [provider]  oxyCODONE -acetaminophen   (PERCOCET/ROXICET) 5-325 MG tablet Take 1 tablet by mouth 4 (four) times daily as needed. 09/28/23   [provider]  sildenafil (VIAGRA) 100 MG tablet Take 100 mg by mouth daily as needed for erectile dysfunction. 12/13/21   [provider]  tamsulosin  (FLOMAX ) 0.4 MG CAPS capsule Take 0.4 mg by mouth at bedtime. 07/04/21   [provider]  tiZANidine  (ZANAFLEX ) 4 MG tablet Take 1 tablet (4 mg total) by mouth 3 (three) times daily. 08/28/23   Charlene Debby BROCKS, PA-C  vitamin B-12 (CYANOCOBALAMIN ) 500 MCG tablet Take 1,000 mcg by mouth daily.    [provider]    Physical Exam: Vitals:   10/19/23 1730 10/19/23 1800 10/19/23 2108 10/19/23 2109  BP: 138/78 132/85    Pulse: 70 71  72  Resp: (!) 21 20  20   Temp:   97.6 F (36.4 C)   TempSrc:   Oral   SpO2: 99% 100%  97%   Constitutional: Resting in bed with head elevated.  NAD, calm, comfortable Eyes: EOMI, lids and conjunctivae normal ENMT: Mucous membranes are moist. Posterior pharynx clear of any exudate or lesions.Normal dentition.  Neck: normal, supple, no masses. Respiratory: clear to auscultation bilaterally, no wheezing, no crackles. Normal respiratory effort. No accessory muscle use.  Cardiovascular: Regular rate and rhythm, no murmurs / rubs / gallops. No extremity edema. 2+ pedal pulses. Abdomen: no tenderness, no masses palpated. Musculoskeletal: Pain over the left acromioclavicular joint, ROM left shoulder diminished due to pain.  ROM of back limited.  Patient unable to sit up or turn to side due to significant lower back pain.  ROM bilateral lower extremities intact. Skin: no rashes, lesions, ulcers. No induration Neurologic: Sensation intact. Strength diminished LUE due to left shoulder pain otherwise 5/5 in all 4.  Psychiatric: Normal judgment and insight. Alert and oriented x 3. Normal mood.   EKG: Not performed.  Assessment/Plan Principal Problem:   Acute osteomyelitis discitis of  thoracic spine T11-T12 (HCC) Active Problems:   Empyema of right pleural space (HCC)   Coronary artery disease due to lipid rich plaque   Essential hypertension, benign   Paroxysmal atrial fibrillation (HCC)   Jacob [name] is a 73 y.o. male with medical history significant for CAD s/p CABG, CAS s/p right CEA, PAF not on AC, HTN, Crohn's disease, right MSSA empyema who is admitted with acute osteomyelitis discitis of T11-T12. *** Assessment and Plan: Acute osteomyelitis discitis at T11-12: ***  History of right MSSA empyema: ***  Left shoulder  pain: ***  Paroxysmal atrial fibrillation: ***  Recent ABLA due to hemothorax while on heparin  anticoagulation: ***  CAD s/p CABG: ***  Hypertension: On previous admission his amlodipine  and lisinopril  were held due to low normal BP.  BP remained stable, will continue Toprol -XL and continue to hold lisinopril  and amlodipine .  Crohn's disease: Quiescent for many years, not on disease-specific therapy.   DVT prophylaxis: heparin  injection 5,000 Units Start: 10/19/23 2300 Code Status: Full code, confirmed with patient on admission Family Communication: Discussed with patient, he has discussed with family Disposition Plan: From home, dispo pending clinical progress Consults called: Neurosurgery Severity of Illness: The appropriate patient status for this patient is INPATIENT. Inpatient status is judged to be reasonable and necessary in order to provide the required intensity of service to ensure the patient's safety. The patient's presenting symptoms, physical exam findings, and initial radiographic and laboratory data in the context of their chronic comorbidities is felt to place them at high risk for further clinical deterioration. Furthermore, it is not anticipated that the patient will be medically stable for discharge from the hospital within 2 midnights of admission.   * I certify that at the point of admission it is my clinical  judgment that the patient will require inpatient hospital care spanning beyond 2 midnights from the point of admission due to high intensity of service, high risk for further deterioration and high frequency of surveillance required.DEWAINE Jorie Blanch MD [facility]  If 7PM-7AM, please contact night-coverage [email or web address]  10/19/2023, 11:01 PM

## 2023-10-19 NOTE — ED Provider Notes (Signed)
 [place] EMERGENCY DEPARTMENT AT [facility] Provider Note   CSN: [id number] Arrival date & time: 10/19/23  1122     Patient presents with: Fluid Buildup In Lungs   Jacob [name] is a 73 y.o. male.   Pt is a 73 yo with pmhx significant for anemia, hld, ulcerative colitis, kidney stones, ED, HTN, TIA, carotid artery stenosis, CAD, depression, crohn's, CVA, CAD, empyema and a new dx of thoracic discitis. Pt had a MSSA empyema that's been treated with chest tuba dn tpa and DNase.  Pt completed amox on 9/24.  He has been feeling worse since then.  Pt went to see his pulmonologist, Dr. Darlean, and was told to come here for IV abx.  He did have a MRI on 9/23 (not visible on epic) which showed some worsening of the discitis, but nothing surgical.  Pt has a lot of pain to his left chest and to his left shoulder.          Prior to Admission medications   Medication Sig Start Date End Date Taking? Authorizing Provider  amiodarone  (PACERONE ) 200 MG tablet Take 1 tablet (200 mg total) by mouth daily. 10/11/23   Terra Fairy PARAS, PA-C  amLODipine  (NORVASC ) 5 MG tablet Take 1 tablet (5 mg total) by mouth daily. 11/05/22   Lucien Orren SAILOR, PA-C  aspirin  EC 81 MG tablet Take 81 mg by mouth daily. Swallow whole.    [provider]  atorvastatin  (LIPITOR) 80 MG tablet Take 1 tablet (80 mg total) by mouth daily. 05/24/23   Shlomo Wilbert SAUNDERS, MD  glucosamine-chondroitin 500-400 MG tablet Take 1 tablet by mouth 2 (two) times daily.    [provider]  HYDROcodone -acetaminophen  (NORCO/VICODIN) 5-325 MG tablet Take 1 tablet by mouth every 8 (eight) hours as needed for severe pain (pain score 7-10). 08/28/23   Charlene Debby BROCKS, PA-C  latanoprost  (XALATAN ) 0.005 % ophthalmic solution Place 1 drop into both eyes at bedtime. 07/19/23   [provider]  lidocaine  (SALONPAS PAIN RELIEVING) 4 % Place 1 patch onto the skin daily.    [provider]  lisinopril  (ZESTRIL ) 20  MG tablet Take 1 tablet (20 mg total) by mouth daily. 03/24/23   Lucien Orren SAILOR, PA-C  metoprolol  succinate (TOPROL -XL) 25 MG 24 hr tablet     [provider]  oxyCODONE -acetaminophen  (PERCOCET/ROXICET) 5-325 MG tablet Take 1 tablet by mouth 4 (four) times daily as needed. 09/28/23   [provider]  sildenafil (VIAGRA) 100 MG tablet Take 100 mg by mouth daily as needed for erectile dysfunction. 12/13/21   [provider]  tamsulosin  (FLOMAX ) 0.4 MG CAPS capsule Take 0.4 mg by mouth at bedtime. 07/04/21   [provider]  tiZANidine  (ZANAFLEX ) 4 MG tablet Take 1 tablet (4 mg total) by mouth 3 (three) times daily. 08/28/23   Charlene Debby BROCKS, PA-C  vitamin B-12 (CYANOCOBALAMIN ) 500 MCG tablet Take 1,000 mcg by mouth daily.    [provider]    Allergies: Patient has no known allergies.    Review of Systems  Respiratory:  Positive for shortness of breath.   Musculoskeletal:        Left shoulder pain Left chest wall pain  All other systems reviewed and are negative.   Updated Vital Signs BP 132/85   Pulse 71   Temp 97.8 F (36.6 C) (Oral)   Resp 20   SpO2 100%   Physical Exam Vitals and nursing note reviewed.  Constitutional:  Appearance: Normal appearance.  HENT:     Head: Normocephalic and atraumatic.     Right Ear: External ear normal.     Left Ear: External ear normal.     Nose: Nose normal.     Mouth/Throat:     Mouth: Mucous membranes are moist.     Pharynx: Oropharynx is clear.  Eyes:     Extraocular Movements: Extraocular movements intact.     Conjunctiva/sclera: Conjunctivae normal.     Pupils: Pupils are equal, round, and reactive to light.  Cardiovascular:     Rate and Rhythm: Normal rate and regular rhythm.     Pulses: Normal pulses.     Heart sounds: Normal heart sounds.  Pulmonary:     Effort: Pulmonary effort is normal.     Breath sounds: Normal breath sounds.  Abdominal:     General: Abdomen is flat. Bowel  sounds are normal.     Palpations: Abdomen is soft.  Musculoskeletal:        General: Normal range of motion.     Cervical back: Normal range of motion and neck supple.  Skin:    General: Skin is warm.     Capillary Refill: Capillary refill takes less than 2 seconds.  Neurological:     General: No focal deficit present.     Mental Status: He is alert and oriented to person, place, and time.  Psychiatric:        Mood and Affect: Mood normal.        Behavior: Behavior normal.     (all labs ordered are listed, but only abnormal results are displayed) Labs Reviewed  CBC WITH DIFFERENTIAL/PLATELET - Abnormal; Notable for the following components:      Result Value   RBC 3.36 (*)    Hemoglobin 9.6 (*)    HCT 30.6 (*)    All other components within normal limits  BASIC METABOLIC PANEL WITH GFR - Abnormal; Notable for the following components:   CO2 21 (*)    Calcium  8.8 (*)    All other components within normal limits  SEDIMENTATION RATE - Abnormal; Notable for the following components:   Sed Rate 95 (*)    All other components within normal limits  C-REACTIVE PROTEIN    EKG: None  Radiology: MR LUMBAR SPINE W WO CONTRAST Result Date: 10/19/2023 CLINICAL DATA:  Initial evaluation for acute osteomyelitis. EXAM: MRI LUMBAR SPINE WITHOUT AND WITH CONTRAST TECHNIQUE: Multiplanar and multiecho pulse sequences of the lumbar spine were obtained without and with intravenous contrast. CONTRAST:  8mL GADAVIST GADOBUTROL 1 MMOL/ML IV SOLN COMPARISON:  Prior MRI from 09/01/2023. FINDINGS: Segmentation: Transitional lumbosacral anatomy with lumbarization of the S1 segment and fairly well-formed S1-2 interspace is seen. Vertebral body count is made from the dens on this exam. Alignment: 3 mm retrolisthesis of L1 on L2, with trace degenerative retrolisthesis of L2 on L3 through L4-5. 3 mm anterolisthesis of L5 on S1. Findings chronic and degenerative. Vertebrae: Vertebral body height maintained  without acute or chronic fracture. Changes of acute osteomyelitis discitis partially visualized at the level of T11-12, described on corresponding MRI of the thoracic spine. No other evidence for acute osteomyelitis discitis or septic arthritis within the lumbar spine. Bone marrow signal intensity within normal limits. No worrisome osseous lesions. No other abnormal marrow edema or enhancement. Conus medullaris and cauda equina: Conus extends to the L1 level. Conus and cauda equina appear normal. Paraspinal and other soft tissues: Diffuse edema present within the posterior paraspinous  musculature of the lower back (series 6, images 17, 1). Findings are nonspecific, but could reflect changes of acute myositis. Possible muscular injury/strain could also be considered. No collections. Multiple T2 hyperintense cyst noted about the visualized kidneys, benign in appearance, no follow-up imaging recommended. Disc levels: L1-2: Retrolisthesis with diffuse disc bulge and disc desiccation. Superimposed small right subarticular disc protrusion. Mild facet hypertrophy. No significant spinal stenosis. Mild left L1 foraminal narrowing. Right neural foramen remains patent. L2-3: Diffuse disc bulge with disc desiccation. Reactive endplate spurring, greater on the left. Mild facet hypertrophy with trace joint effusions. No significant spinal stenosis. Mild left L2 foraminal narrowing. Right neural foramen remains patent. L3-4: Degenerate intervertebral disc space narrowing with disc desiccation diffuse disc bulge, asymmetric to the right. Reactive endplate spurring, also worse on the right. Mild to moderate bilateral facet hypertrophy. Resultant mild narrowing of the right lateral recess. Central canal remains patent. Moderate right L3 foraminal stenosis for the left neural foramen remains patent. L4-5: Degenerative disc space narrowing with disc desiccation diffuse disc bulge. Small right extraforaminal disc protrusion closely  approximates the exiting right L4 nerve root. Mild facet and ligament flavum hypertrophy. No significant spinal stenosis. Mild bilateral L4 foraminal narrowing. L5-S1: Anterolisthesis. Disc desiccation with diffuse disc bulge. Moderate bilateral facet arthrosis. No significant spinal stenosis. Mild bilateral L5 foraminal narrowing. S1-2: Transitional lumbosacral anatomy with a well-formed S1-2 interspace. No disc bulge or focal disc herniation. Mild right worse than left facet arthrosis. No stenosis. IMPRESSION: 1. No MRI evidence for acute osteomyelitis discitis or septic arthritis within the lumbar spine. 2. Diffuse edema within the posterior paraspinous musculature of the lower back, nonspecific, but could reflect changes of acute myositis. Possible muscular injury/strain could also be considered. No collections. 3. Underlying multilevel degenerative spondylosis and facet arthrosis as above. No significant spinal stenosis. Mild to moderate bilateral L3 through L5 foraminal narrowing as above. 4. Changes of acute osteomyelitis discitis at T11-12, described on corresponding MRI of the thoracic spine. Electronically Signed   By: Morene Hoard M.D.   On: 10/19/2023 20:18   MR THORACIC SPINE W WO CONTRAST Result Date: 10/19/2023 CLINICAL DATA:  Initial evaluation for thoracic osteomyelitis. EXAM: MRI THORACIC WITHOUT AND WITH CONTRAST TECHNIQUE: Multiplanar and multiecho pulse sequences of the thoracic spine were obtained without and with intravenous contrast. CONTRAST:  8mL GADAVIST GADOBUTROL 1 MMOL/ML IV SOLN COMPARISON:  None Available. FINDINGS: Alignment: Exaggeration of the normal thoracic kyphosis. No significant listhesis. Vertebrae: Findings consistent with acute osteomyelitis discitis at T11-12. Associated exuberant reactive marrow edema and enhancement. Endplate irregularity about the T11-12 interspace, with mild height loss about the T11 and T12 vertebral bodies. Mild paraspinous  edema/inflammation but no loculated paraspinous collections. No significant epidural involvement at this time. No other evidence for distant infection elsewhere within the thoracic spine. Vertebral body height otherwise maintained. Bone marrow signal intensity within normal limits. No worrisome osseous lesions. Cord:  Normal signal and morphology. Paraspinal and other soft tissues: Mild paraspinous inflammation adjacent to the T11-12 interspace. Small right pleural effusion. Ovoid T1/T2 hyperintense lesion at the posterior right pleural space measures 3.8 x 2.6 cm (series 20, image 32), nonspecific, but possibly a loculated complex effusion. Few small T2 hyperintense left renal cyst noted, benign in appearance, no follow-up imaging recommended. Disc levels: T1-2:  Unremarkable. T2-3: Unremarkable. T3-4:  Small right paracentral disc protrusion.  No stenosis. T4-5:  Small central disc protrusion without stenosis. T5-6:  Small right paracentral disc protrusion without stenosis. T6-7:  Small central disc  protrusion without significant stenosis. T7-8: Tiny left paracentral disc protrusion without significant stenosis. T8-9: Central disc protrusion indents the ventral thecal sac. Minimal cord flattening without cord signal changes or significant spinal stenosis. Mild right-sided facet hypertrophy. Foramina remain patent. T9-10: Mild disc bulge with facet hypertrophy.  No stenosis. T10-11: Negative interspace. Mild right greater left facet hypertrophy. No stenosis. T11-12: Findings consistent with osteomyelitis discitis. No significant epidural involvement. No spinal stenosis. Foramina remain grossly patent. T12-L1:  Negative interspace.  Mild facet hypertrophy.  No stenosis. IMPRESSION: 1. Findings consistent with acute osteomyelitis discitis at T11-12. No significant epidural involvement at this time. 2. Small right pleural effusion with 3.8 x 2.6 cm ovoid T1/T2 hyperintense lesion at the posterior right pleural space,  nonspecific, but possibly a loculated complex effusion. Correlation with dedicated chest imaging suggested for further evaluation. 3. Underlying multilevel thoracic spondylosis as above without significant stenosis. Electronically Signed   By: Morene Hoard M.D.   On: 10/19/2023 19:58   DG Chest 2 View Result Date: 10/19/2023 CLINICAL DATA:  fluid buildup on lungs EXAM: CHEST - 2 VIEW COMPARISON:  10/07/23 FINDINGS: Low lung volumes. No new, airspace consolidation. Trace pleural effusion, unchanged. No pneumothorax. No cardiomegaly. CABG changes with sternotomy wires. No acute fracture or destructive lesion. IMPRESSION: Low lung volumes with a trace right pleural effusion. Otherwise, no acute cardiopulmonary abnormality. Electronically Signed   By: Rogelia Myers M.D.   On: 10/19/2023 12:51   DG Shoulder Left Result Date: 10/19/2023 CLINICAL DATA:  shoulder pain EXAM: LEFT SHOULDER - 2+ VIEW COMPARISON:  None Available. FINDINGS: No acute fracture or dislocation. There is no evidence of arthropathy or other focal bone abnormality. Soft tissues are unremarkable. Sternotomy wires and postsurgical changes of a prior CABG. IMPRESSION: No acute fracture or dislocation. Electronically Signed   By: Rogelia Myers M.D.   On: 10/19/2023 12:45     Procedures   Medications Ordered in the ED  LORazepam (ATIVAN) injection 1 mg (has no administration in time range)  morphine  (PF) 4 MG/ML injection 4 mg (4 mg Intravenous Given 10/19/23 1656)  ondansetron  (ZOFRAN ) injection 4 mg (4 mg Intravenous Given 10/19/23 1658)  ceFAZolin  (ANCEF ) IVPB 1 g/50 mL premix (0 g Intravenous Stopped 10/19/23 1731)  gadobutrol (GADAVIST) 1 MMOL/ML injection 8 mL (8 mLs Intravenous Contrast Given 10/19/23 1920)                                    Medical Decision Making Amount and/or Complexity of Data Reviewed Labs: ordered. Radiology: ordered.  Risk Prescription drug management. Decision regarding  hospitalization.   This patient presents to the ED for concern of worsening discitis, this involves an extensive number of treatment options, and is a complaint that carries with it a high risk of complications and morbidity.  The differential diagnosis includes discitis, osteomyelitis, empyema   Co morbidities that complicate the patient evaluation  anemia, hld, ulcerative colitis, kidney stones, ED, HTN, TIA, carotid artery stenosis, CAD, depression, crohn's, CVA, CAD, and recent dx of empyema and thoracic discitis   Additional history obtained:  Additional history obtained from epic chart review External records from outside source obtained and reviewed including wife   Lab Tests:  I Ordered, and personally interpreted labs.  The pertinent results include:  cbc with hgb low at 9.6 (hgb 11.0 on 9/18);bmp nl; ESR up to 95   Imaging Studies ordered:  I ordered imaging studies  including cxr, shoulder, mri thoracic spine/lumbar spine/shoulder  I independently visualized and interpreted imaging which showed  CXR: Low lung volumes with a trace right pleural effusion. Otherwise, no  acute cardiopulmonary abnormality.  L shoulder: No acute fracture or dislocation.  MR thoracic: 1. Findings consistent with acute osteomyelitis discitis at T11-12.  No significant epidural involvement at this time.  2. Small right pleural effusion with 3.8 x 2.6 cm ovoid T1/T2  hyperintense lesion at the posterior right pleural space,  nonspecific, but possibly a loculated complex effusion. Correlation  with dedicated chest imaging suggested for further evaluation.  3. Underlying multilevel thoracic spondylosis as above without  significant stenosis.  MRI Lumbar: . No MRI evidence for acute osteomyelitis discitis or septic  arthritis within the lumbar spine.  2. Diffuse edema within the posterior paraspinous musculature of the  lower back, nonspecific, but could reflect changes of acute  myositis.  Possible muscular injury/strain could also be considered.  No collections.  3. Underlying multilevel degenerative spondylosis and facet  arthrosis as above. No significant spinal stenosis. Mild to moderate  bilateral L3 through L5 foraminal narrowing as above.  4. Changes of acute osteomyelitis discitis at T11-12, described on  corresponding MRI of the thoracic spine.   I agree with the radiologist interpretation   Cardiac Monitoring:  The patient was maintained on a cardiac monitor.  I personally viewed and interpreted the cardiac monitored which showed an underlying rhythm of: nsr   Medicines ordered and prescription drug management:  I ordered medication including ancef   for sx  Reevaluation of the patient after these medicines showed that the patient improved I have reviewed the patients home medicines and have made adjustments as needed   Test Considered:  mri   Critical Interventions:  Iv abx   Consultations Obtained:  I requested consultation with the pulmonologist (Dr. Claudene),  and discussed lab and imaging findings as well as pertinent plan - he did speak with Dr. Darlean and their main concern was the discitis.  Empyema seems to be improving. Pt d/w Dr. Louis (NS).  He requests pt to be admitted for IV abx as his discitis is not improving. Pt d/w Dr. Tobie (triad) for admission   Problem List / ED Course:  Discitis:  Empyema was MSSA, so I have given pt iv ancef .  Pain is under control with iv morphine .   Reevaluation:  After the interventions noted above, I reevaluated the patient and found that they have :improved   Social Determinants of Health:  Lives at home   Dispostion:  After consideration of the diagnostic results and the patients response to treatment, I feel that the patent would benefit from admission.    CRITICAL CARE Performed by: Mliss Boyers   Total critical care time: 30 minutes  Critical care time was exclusive of separately  billable procedures and treating other patients.  Critical care was necessary to treat or prevent imminent or life-threatening deterioration.  Critical care was time spent personally by me on the following activities: development of treatment plan with patient and/or surrogate as well as nursing, discussions with consultants, evaluation of patient's response to treatment, examination of patient, obtaining history from patient or surrogate, ordering and performing treatments and interventions, ordering and review of laboratory studies, ordering and review of radiographic studies, pulse oximetry and re-evaluation of patient's condition.      Final diagnoses:  Discitis of thoracic region    ED Discharge Orders     None  Dean Clarity, MD 10/19/23 2055

## 2023-10-19 NOTE — Assessment & Plan Note (Addendum)
 Complicated by osteomyelitis and likely septic arthitis and failure of both lytics and out pt rx  Dr Sisto not reviewed from 10/12/23 indicates serious deterioration in MRI and advised to go to ER so I did the same today and will notify Triage of our concerns.   F/u with PCCM as inpt   Discussed in detail all the  indications, usual  risks and alternatives  relative to the benefits with patient who agrees to proceed with w/u as outlined.            Each maintenance medication was reviewed in detail including emphasizing most importantly the difference between maintenance and prns and under what circumstances the prns are to be triggered using an action plan format where appropriate.  Total time for H and P, chart review, counseling,  visit / same day charting = 43 ,om

## 2023-10-19 NOTE — ED Triage Notes (Signed)
 Pt sent by doctor for fluid on left lung, per pt. Pt states he was in the hospital recently and taking abx after discharge but doctor said there was still fluid on his left lung.

## 2023-10-19 NOTE — Patient Instructions (Signed)
 Go to [facility] ER ASAP

## 2023-10-19 NOTE — ED Provider Triage Note (Signed)
 Emergency Medicine Provider Triage Evaluation Note  [name] , a 73 y.o. male  was evaluated in triage.  Pt complains of shortness of breath and left shoulder pain.  States he was sent in by his primary care provider for possible septic arthritis of his left shoulder.  Recently had a empyema that was drained in his lungs and stopped antibiotics about a week ago.  Review of Systems  Positive: Shortness of breath, cough, left shoulder pain Negative: Chest pain, back pain, fevers  Physical Exam  BP 135/70   Pulse 67   Temp 98.2 F (36.8 C)   Resp 16   SpO2 98%  Gen:   Awake, no distress   Resp:  Normal effort  MSK:   Left shoulder is tenderness to palpation over the anterior posterior aspect but there is no swelling or stiffness of the joint, patient does have full range of motion with some limitation due to pain   Medical Decision Making  Medically screening exam initiated at 12:00 PM.  Appropriate orders placed.  [name] was informed that the remainder of the evaluation will be completed by another provider, this initial triage assessment does not replace that evaluation, and the importance of remaining in the ED until their evaluation is complete.     [name], DO 10/19/23 1201

## 2023-10-20 DIAGNOSIS — M4624 Osteomyelitis of vertebra, thoracic region: Secondary | ICD-10-CM | POA: Diagnosis not present

## 2023-10-20 LAB — CBC
HCT: 28 % — ABNORMAL LOW (ref 39.0–52.0)
Hemoglobin: 8.9 g/dL — ABNORMAL LOW (ref 13.0–17.0)
MCH: 28.8 pg (ref 26.0–34.0)
MCHC: 31.8 g/dL (ref 30.0–36.0)
MCV: 90.6 fL (ref 80.0–100.0)
Platelets: 277 K/uL (ref 150–400)
RBC: 3.09 MIL/uL — ABNORMAL LOW (ref 4.22–5.81)
RDW: 14.1 % (ref 11.5–15.5)
WBC: 7.6 K/uL (ref 4.0–10.5)
nRBC: 0 % (ref 0.0–0.2)

## 2023-10-20 LAB — BASIC METABOLIC PANEL WITH GFR
Anion gap: 10 (ref 5–15)
BUN: 13 mg/dL (ref 8–23)
CO2: 24 mmol/L (ref 22–32)
Calcium: 8.5 mg/dL — ABNORMAL LOW (ref 8.9–10.3)
Chloride: 102 mmol/L (ref 98–111)
Creatinine, Ser: 1.05 mg/dL (ref 0.61–1.24)
GFR, Estimated: >60 mL/min (ref >60)
Glucose, Bld: 101 mg/dL — ABNORMAL HIGH (ref 70–99)
Potassium: 3.9 mmol/L (ref 3.5–5.1)
Sodium: 136 mmol/L (ref 135–145)

## 2023-10-20 LAB — C-REACTIVE PROTEIN: CRP: 14.6 mg/dL — ABNORMAL HIGH (ref <1.0)

## 2023-10-20 MED ORDER — MORPHINE SULFATE (PF) 2 MG/ML IV SOLN
2.0000 mg | INTRAVENOUS | Status: DC | PRN
  Administered 2023-10-23: 2 mg via INTRAVENOUS
  Filled 2023-10-20: qty 1

## 2023-10-20 MED ORDER — ACETAMINOPHEN 650 MG RE SUPP: 650.0000 mg | Freq: Three times a day (TID) | RECTAL | Status: DC

## 2023-10-20 MED ORDER — OXYCODONE HCL 5 MG PO TABS
5.0000 mg | ORAL_TABLET | ORAL | Status: DC | PRN
  Administered 2023-10-20 – 2023-10-26 (×18): 10 mg via ORAL
  Filled 2023-10-20 (×15): qty 2
  Filled 2023-10-20: qty 1
  Filled 2023-10-20 (×3): qty 2

## 2023-10-20 MED ORDER — ACETAMINOPHEN 500 MG PO TABS
1000.0000 mg | ORAL_TABLET | Freq: Three times a day (TID) | ORAL | Status: DC
  Administered 2023-10-20 – 2023-10-26 (×17): 1000 mg via ORAL
  Filled 2023-10-20 (×17): qty 2

## 2023-10-20 NOTE — Plan of Care (Signed)
  Problem: Pain Managment: Goal: General experience of comfort will improve and/or be controlled Outcome: Progressing   Problem: Safety: Goal: Ability to remain free from injury will improve Outcome: Progressing

## 2023-10-20 NOTE — Progress Notes (Signed)
   10/20/23 0855  TOC Brief Assessment  Insurance and Status Reviewed  Patient has primary care physician Yes  Home environment has been reviewed wife, walker  Prior level of function: independent with walker  Prior/Current Home Services Current home services ([facility] active for HHPT)  Social Drivers of Health Review SDOH reviewed no interventions necessary  Readmission risk has been reviewed Yes  Transition of care needs no transition of care needs at this time   Patient from home with wife. Has walker at home. Confirmed with Burnard with [facility] he is active with them for HHPT. They will need new order to continue services.   Inpatient Care Management Team will continue to follow for discharge needs .

## 2023-10-20 NOTE — Consult Note (Signed)
 [facility]    Date of Admission:  10/19/2023     Total days of antibiotics 2   Cefazolin  9/30 >> c           Reason for Consult: T11-12 Discitis w/o abscess     Referring Provider: Fausto  Primary Care Provider: Pachia Strum Charlene CROME, MD   Assessment: Jacob [name] is a 73 y.o. male admitted with    New T11-12 Discitis w/o Abscess -  Most certainly this would be related to recent MSSA infection. Blood cultures drawn on admit. No systemic features of illness however.  Will repeat TTE and consider Jacob given what seems to be seeded area. Not clear his back pain ever really improved in talking with him and prior imaging only dedicated Lspine.  FU Blood cultures drawn 9/30.  Crp 14.5, ESR 95 here  If not improving will need to have IR arrange biopsy of the area or if NSGY feels we should pursue this now for more microbiologic data given emergence on MSSA targated therapy, that's reasonable   Left Shoulder Pain -  Acute in the setting of fall at home. Small partial thickness tear of posterior supraspinatus tendun, mild AC joint arthropathy, bursitis, trace edema of supraspinatus muscle indicative of strain. No effusion.   Empyema, MSSA -  Small pleural effusion. Had FU with Dr. Darlean with Pulm yesterday    Plan: Continue cefazolin  for known MSSA infection history   Repeat TTE given metastatic infection site.  Would have neurosurgery see him if not already requested.  If not improving will need to have IR arrange biopsy of the area or if NSGY feels we should pursue this now for more microbiologic data given emergence on MSSA targated therapy, that's reasonable    Principal Problem:   Acute osteomyelitis discitis of thoracic spine T11-T12 (HCC) Active Problems:   Coronary artery disease due to lipid rich plaque   Essential hypertension, benign   Empyema of right pleural space (HCC)   Paroxysmal atrial fibrillation (HCC)    acetaminophen    1,000 mg Oral Q8H   Or   acetaminophen   650 mg Rectal Q8H   amiodarone   200 mg Oral Daily   aspirin  EC  81 mg Oral Daily   atorvastatin   80 mg Oral Daily   heparin   5,000 Units Subcutaneous Q8H   metoprolol  succinate  25 mg Oral Daily   sodium chloride  flush  3 mL Intravenous Q12H   tamsulosin   0.4 mg Oral QHS    HPI: Jacob [name] is a 73 y.o. male admitted from ER after worsening left sided back and shoulder pain after stopping antibiotics for known MSSA infection recently.   Seen with Dr. Luiz in August with staphylococcus aureus pneumonia and new onset atrial fibrillation. MRI of the back at that time revealed DDD worse L3-4 w/o epidural abscess. CXR showed pleural effusion that was drained and eventually required pig tail catheter placed to drain along with fibrinolytic's. Cultures were + for MSSA. He was not bacteremic at this time and had an injury to back in July to explain discomfort. TTE was unremarkable for infection at that time.  He was transitioned to a prolonged course of Augmentin  through 9/24 (his last reported dose).  FU with Dr. Luiz on 9/15 -->   He has terrible left shoulder pain -his wife clarifies that he had a bad fall at home where he ended up on his left side from  standing height. MRI reviewed and non-infectious pathology observed with tendon tear and inflammation likely from injury.   H/O HTN, HLD, CAD s/p CABG 2005, endarderactomy 2018, Chron's disease, prior CVA/TIA,   Review of Systems: Review of Systems  Constitutional:  Negative for chills, diaphoresis, fever, malaise/fatigue and weight loss.  Respiratory: Negative.    Cardiovascular: Negative.   Musculoskeletal:  Positive for back pain and joint pain.  Neurological:  Negative for dizziness and headaches.    Past Medical History:  Diagnosis Date   CAD (coronary artery disease), native coronary artery     multivessel ASCAD s/p CABG and s/p PCI of SVG to RI 2008   Carotid artery stenosis     patent right CEA site and 1-39% Lcarotid stenosis   Crohn's disease (HCC)    flare 2000   Depression    Dyslipidemia    Dyspnea    ED (erectile dysfunction)    History of blood transfusion 2005   Hypertension    Myocardial infarction (HCC) 2005   5 bypasses   Nephrolithiasis    Stroke (HCC)    TIA- Endartarectemy R side   TIA (transient ischemic attack)    Ulcerative colitis (HCC)    Left sided    Social History   Tobacco Use   Smoking status: Never   Smokeless tobacco: Former    Types: Snuff  Vaping Use   Vaping status: Never Used  Substance Use Topics   Alcohol use: No   Drug use: No    Family History  Problem Relation Age of Onset   Ovarian cancer Mother    CAD Father    CAD Sister    CAD Brother    CAD Brother    CAD Brother    Colon cancer Neg Hx    Esophageal cancer Neg Hx    Stomach cancer Neg Hx    Rectal cancer Neg Hx    Colon polyps Neg Hx    No Known Allergies  OBJECTIVE: Blood pressure 100/67, pulse (!) 52, temperature 98.2 F (36.8 C), temperature source Oral, resp. rate (!) 21, SpO2 98%.  Physical Exam  Lab Results Lab Results  Component Value Date   WBC 7.6 10/20/2023   HGB 8.9 (L) 10/20/2023   HCT 28.0 (L) 10/20/2023   MCV 90.6 10/20/2023   PLT 277 10/20/2023    Lab Results  Component Value Date   CREATININE 1.05 10/20/2023   BUN 13 10/20/2023   NA 136 10/20/2023   K 3.9 10/20/2023   CL 102 10/20/2023   CO2 24 10/20/2023    Lab Results  Component Value Date   ALT 18 09/09/2023   AST 41 09/09/2023   ALKPHOS 70 09/09/2023   BILITOT 0.4 09/09/2023     Microbiology: Recent Results (from the past 240 hours)  Culture, blood (Routine X 2) w Reflex to ID Panel     Status: None (Preliminary result)   Collection Time: 10/19/23  9:40 PM   Specimen: BLOOD LEFT WRIST  Result Value Ref Range Status   Specimen Description BLOOD LEFT WRIST  Final   Special Requests   Final    BOTTLES DRAWN AEROBIC AND ANAEROBIC Blood Culture  adequate volume   Culture   Final    NO GROWTH < 12 HOURS Performed at [facility], [address] [place], [place] [address]    Report Status PENDING  Incomplete  Culture, blood (Routine X 2) w Reflex to ID Panel     Status: None (  Preliminary result)   Collection Time: 10/19/23  9:50 PM   Specimen: BLOOD RIGHT FOREARM  Result Value Ref Range Status   Specimen Description BLOOD RIGHT FOREARM  Final   Special Requests   Final    BOTTLES DRAWN AEROBIC AND ANAEROBIC Blood Culture results may not be optimal due to an inadequate volume of blood received in culture bottles   Culture   Final    NO GROWTH < 12 HOURS Performed at [facility], [address], [place], [place] [address]    Report Status PENDING  Incomplete    Corean Fireman, MSN, NP-C [facility] Pager: [phone]  10/20/2023 3:19 PM    Total Encounter Time: 74m

## 2023-10-20 NOTE — ED Notes (Signed)
 Courtesy call given to Chasity-RN for 6N

## 2023-10-20 NOTE — Progress Notes (Signed)
 Progress Note   Patient: Jacob [name] FMW:[id number] DOB: 1950-10-02 DOA: 10/19/2023     1 DOS: the patient was seen and examined on 10/20/2023   Brief hospital course: Jacob [name] is a 73 y.o. male with medical history significant for CAD s/p CABG, CAS s/p right CEA, PAF not on AC, HTN, Crohn's disease, right MSSA empyema who is admitted with acute osteomyelitis discitis of T11-T12.  This was seen on outpatient MRI 9/11, read on 9/19 and referred to ED from Pulmonology follow up appointment due to worsening back and left shoulder pain in the setting of abnormal MRI findings.  Of note, pt had recent MSSA empyema during admission in August. See H&P for full HPI on admission & ED course.   Assessment and Plan:  Acute osteomyelitis discitis at T11-12: Presumed due to MSSA given recent empyema.   --Consult ID, appreciate recommendations --Continue IV Ancef   --Follow blood cultures   History of right MSSA empyema: admitted in August.  Has since completed course of oral antibiotics with Augmentin  as ID recommended that admission.  Imaging shows improvement in pleural effusion, small residual fluid remains. Seen  in Pulmonology clinic follow up 9/30 --EDP discussed case with Dr. Claudene.  Empyema felt to be improving.   Left shoulder pain: Patient reports pain limiting range of motion of left shoulder.  Pain seems to primarily be at the [facility] joint area.  He denies any injury.  Concern is for possible septic arthritis.   MRI left shoulder shows small partial-thickness tear of the posterior supraspinatus tendon, diffuse subclavius muscular edema and mild enhancement which may reflect strain, inflammation or denervation edema...   Paroxysmal atrial fibrillation: NSR on admission. --Pt has been off anticoagulation due to recent acute blood loss anemia from hemothorax.  --Continue amiodarone  and Toprol -XL --Continue on aspirin  81 mg daily for now.   Recent ABLA due to hemothorax while on  heparin  anticoagulation: Hemoglobin is stable at 9.6.   --Monitor CBC --Monitor for bleeding   CAD s/p CABG: --Continue aspirin , Toprol -XL, atorvastatin .   Hypertension: BP's controlled, soft this AM --Continue Toprol -XL  --Holding lisinopril  and amlodipine  - resume when BP indicates   Crohn's disease: Quiescent for many years, not on disease-specific therapy.      Subjective: Pt seen sitting up in bed, wife at bedside this AM.  He reports ongoing back and left sided shoulder pain.  No fever or chills.    Physical Exam: Vitals:   10/20/23 0607 10/20/23 0737 10/20/23 1025 10/20/23 1541  BP: 113/72 123/79 100/67   Pulse: 72  (!) 52   Resp: 17 (!) 21    Temp: 98.2 F (36.8 C)     TempSrc: Oral     SpO2: 93%  98%   Weight:    81 kg  Height:    5' 11 (1.803 m)   General exam: awake, alert, no acute distress HEENT: moist mucus membranes, hearing grossly normal  Respiratory system: CTAB, no wheezes, rales or rhonchi, normal respiratory effort. Cardiovascular system: normal S1/S2, RRR, no pedal edema.   Gastrointestinal system: soft, NT, ND, no HSM felt, +bowel sounds. Central nervous system: A&O x 3. no gross focal neurologic deficits, normal speech Extremities: moves all, no edema, normal tone Skin: dry, intact, normal temperature Psychiatry: normal mood, congruent affect, judgement and insight appear normal  Data Reviewed:  Notable labs -- CRP 14.6, Hbg 9.6 >> 8.9  Family Communication: at bedside on rounds  Disposition: Status is: Inpatient Remains inpatient appropriate because: remain  on IV antibiotics, ongoing evaluation as per ID.   Planned Discharge Destination: Home with Home Health    Time spent: 45 minutes  Author: Burnard DELENA Cunning, DO 10/20/2023 3:44 PM  For on call review [email or web address].

## 2023-10-20 NOTE — Plan of Care (Signed)

## 2023-10-21 ENCOUNTER — Inpatient Hospital Stay (HOSPITAL_COMMUNITY)

## 2023-10-21 DIAGNOSIS — M4624 Osteomyelitis of vertebra, thoracic region: Secondary | ICD-10-CM | POA: Diagnosis not present

## 2023-10-21 DIAGNOSIS — I38 Endocarditis, valve unspecified: Secondary | ICD-10-CM

## 2023-10-21 LAB — CBC
HCT: 28 % — ABNORMAL LOW (ref 39.0–52.0)
Hemoglobin: 8.8 g/dL — ABNORMAL LOW (ref 13.0–17.0)
MCH: 28.3 pg (ref 26.0–34.0)
MCHC: 31.4 g/dL (ref 30.0–36.0)
MCV: 90 fL (ref 80.0–100.0)
Platelets: 293 K/uL (ref 150–400)
RBC: 3.11 MIL/uL — ABNORMAL LOW (ref 4.22–5.81)
RDW: 14 % (ref 11.5–15.5)
WBC: 6.7 K/uL (ref 4.0–10.5)
nRBC: 0 % (ref 0.0–0.2)

## 2023-10-21 LAB — BASIC METABOLIC PANEL WITH GFR
Anion gap: 7 (ref 5–15)
BUN: 10 mg/dL (ref 8–23)
CO2: 25 mmol/L (ref 22–32)
Calcium: 8.5 mg/dL — ABNORMAL LOW (ref 8.9–10.3)
Chloride: 102 mmol/L (ref 98–111)
Creatinine, Ser: 1.23 mg/dL (ref 0.61–1.24)
GFR, Estimated: >60 mL/min (ref >60)
Glucose, Bld: 104 mg/dL — ABNORMAL HIGH (ref 70–99)
Potassium: 3.5 mmol/L (ref 3.5–5.1)
Sodium: 134 mmol/L — ABNORMAL LOW (ref 135–145)

## 2023-10-21 LAB — ECHOCARDIOGRAM COMPLETE
AR max vel: 2.43 cm2
AV Area VTI: 2.76 cm2
AV Area mean vel: 2.71 cm2
AV Mean grad: 4 mmHg
AV Peak grad: 7.2 mmHg
Ao pk vel: 1.34 m/s
Area-P 1/2: 2.87 cm2
Height: 71 in
S' Lateral: 3.5 cm
Weight: 2857.16 [oz_av]

## 2023-10-21 MED ORDER — LIDOCAINE 5 % EX PTCH
1.0000 | MEDICATED_PATCH | CUTANEOUS | Status: DC
  Administered 2023-10-21 – 2023-10-25 (×4): 1 via TRANSDERMAL
  Filled 2023-10-21 (×4): qty 1

## 2023-10-21 NOTE — Progress Notes (Signed)
 Orthopedic Tech Progress Note Patient Details:  Jacob [name] [birth year] [id number]  Ortho Devices Type of Ortho Device: Arm sling Ortho Device/Splint Location: LUE Ortho Device/Splint Interventions: Application, Ordered      Elke Holtry A Liandro Thelin 10/21/2023, 5:08 PM

## 2023-10-21 NOTE — Consult Note (Signed)
 Reason for Consult: Thoracic osteomyelitis discitis Referring Physician: Medicine  Jacob [name] is an 73 y.o. male.  HPI: 73 year old male with past history of pleural empyema treated with oral antibiotics.  Patient with increasing thoracic pain with minimal radiating pain numbness or weakness.  Outpatient evaluation last week demonstrated osteomy discitis at T11-12.  Patient's segmentation rate had gone down and he just recently stopped taking antibiotics for his pleural empyema.  The patient's back pain has worsened.  Recent MRI scan demonstrates progression of his osteomyelitis discitis at T11-T12.  No evidence of significant epidural abscess.  Patient also with significant multilevel thoracic disc degeneration with some small central disc bulges which do not cause severe cord compression.  MRI scanning of his lumbar spine is negative.  Cervical MRI scanning is limited on his thoracic series but no evidence of compressive pathology there.  Past Medical History:  Diagnosis Date   CAD (coronary artery disease), native coronary artery     multivessel ASCAD s/p CABG and s/p PCI of SVG to RI 2008   Carotid artery stenosis    patent right CEA site and 1-39% Lcarotid stenosis   Crohn's disease (HCC)    flare 2000   Depression    Dyslipidemia    Dyspnea    ED (erectile dysfunction)    History of blood transfusion 2005   Hypertension    Myocardial infarction [facility]) 2005   5 bypasses   Nephrolithiasis    Stroke [facility])    TIA- Endartarectemy R side   TIA (transient ischemic attack)    Ulcerative colitis (HCC)    Left sided    Past Surgical History:  Procedure Laterality Date   CARDIAC CATHETERIZATION  06/08   Patnet grafts LAD, marginal, distal right   carpel tunnel surgery Left    COLONOSCOPY     COLONOSCOPY WITH PROPOFOL  N/A 10/25/2012   Procedure: COLONOSCOPY WITH PROPOFOL ;  Surgeon: Gladis MARLA Louder, MD;  Location: [facility];  Service: Endoscopy;  Laterality: N/A;   CORONARY  ARTERY BYPASS GRAFT  2005   ASCVD,multivessel,S/P CABG  x5 total per patient   ENDARTERECTOMY Right 06/24/2016   Procedure: ENDARTERECTOMY CAROTID;  Surgeon: Marea Selinda RAMAN, MD;  Location: [facility] ORS;  Service: Vascular;  Laterality: Right;   HERNIA REPAIR  many years ago   IR THORACENTESIS ASP PLEURAL SPACE W/IMG GUIDE  09/02/2023   KNEE ARTHROSCOPY Right 1995    Family History  Problem Relation Age of Onset   Ovarian cancer Mother    CAD Father    CAD Sister    CAD Brother    CAD Brother    CAD Brother    Colon cancer Neg Hx    Esophageal cancer Neg Hx    Stomach cancer Neg Hx    Rectal cancer Neg Hx    Colon polyps Neg Hx     Social History:  reports that he has never smoked. He has quit using smokeless tobacco.  His smokeless tobacco use included snuff. He reports that he does not drink alcohol and does not use drugs.  Allergies: No Known Allergies  Medications: I have reviewed the patient's current medications.  Results for orders placed or performed during the hospital encounter of 10/19/23 (from the past 48 hours)  Culture, blood (Routine X 2) w Reflex to ID Panel     Status: None (Preliminary result)   Collection Time: 10/19/23  9:40 PM   Specimen: BLOOD LEFT WRIST  Result Value Ref Range   Specimen Description BLOOD  LEFT WRIST    Special Requests      BOTTLES DRAWN AEROBIC AND ANAEROBIC Blood Culture adequate volume   Culture      NO GROWTH 2 DAYS Performed at [facility], [address] [place], [place] [address]    Report Status PENDING   Culture, blood (Routine X 2) w Reflex to ID Panel     Status: None (Preliminary result)   Collection Time: 10/19/23  9:50 PM   Specimen: BLOOD RIGHT FOREARM  Result Value Ref Range   Specimen Description BLOOD RIGHT FOREARM    Special Requests      BOTTLES DRAWN AEROBIC AND ANAEROBIC Blood Culture results may not be optimal due to an inadequate volume of blood received in culture bottles   Culture      NO GROWTH 2  DAYS Performed at [facility], [address], [place], [place] [address]    Report Status PENDING   Basic metabolic panel     Status: Abnormal   Collection Time: 10/20/23  6:15 AM  Result Value Ref Range   Sodium 136 135 - 145 mmol/L   Potassium 3.9 3.5 - 5.1 mmol/L   Chloride 102 98 - 111 mmol/L   CO2 24 22 - 32 mmol/L   Glucose, Bld 101 (H) 70 - 99 mg/dL    Comment: Glucose reference range applies only to samples taken after fasting for at least 8 hours.   BUN 13 8 - 23 mg/dL   Creatinine, Ser 8.94 0.61 - 1.24 mg/dL   Calcium  8.5 (L) 8.9 - 10.3 mg/dL   GFR, Estimated >39 >39 mL/min    Comment: (NOTE) Calculated using the CKD-EPI Creatinine Equation (2021)    Anion gap 10 5 - 15    Comment: Performed at [facility], [address], [place], [place] [address]  CBC     Status: Abnormal   Collection Time: 10/20/23  6:15 AM  Result Value Ref Range   WBC 7.6 4.0 - 10.5 K/uL   RBC 3.09 (L) 4.22 - 5.81 MIL/uL   Hemoglobin 8.9 (L) 13.0 - 17.0 g/dL   HCT 71.9 (L) 60.9 - 47.9 %   MCV 90.6 80.0 - 100.0 fL   MCH 28.8 26.0 - 34.0 pg   MCHC 31.8 30.0 - 36.0 g/dL   RDW 85.8 88.4 - 84.4 %   Platelets 277 150 - 400 K/uL   nRBC 0.0 0.0 - 0.2 %    Comment: Performed at [facility], [address], [place], [place] [address]  C-reactive protein     Status: Abnormal   Collection Time: 10/20/23 10:02 AM  Result Value Ref Range   CRP 14.6 (H) <1.0 mg/dL    Comment: Performed at [facility], [address], [place], [place] [address]  CBC     Status: Abnormal   Collection Time: 10/21/23  2:56 AM  Result Value Ref Range   WBC 6.7 4.0 - 10.5 K/uL   RBC 3.11 (L) 4.22 - 5.81 MIL/uL   Hemoglobin 8.8 (L) 13.0 - 17.0 g/dL   HCT 71.9 (L) 60.9 - 47.9 %   MCV 90.0 80.0 - 100.0 fL   MCH 28.3 26.0 - 34.0 pg   MCHC 31.4 30.0 - 36.0 g/dL   RDW 85.9 88.4 - 84.4 %   Platelets 293 150 - 400 K/uL   nRBC 0.0 0.0 - 0.2 %    Comment: Performed at [facility], [address] [place], [place] [address]  Basic metabolic panel with GFR     Status: Abnormal   Collection Time: 10/21/23  2:56 AM  Result Value Ref Range   Sodium 134 (L) 135 - 145 mmol/L   Potassium 3.5 3.5 - 5.1 mmol/L   Chloride 102 98 - 111 mmol/L   CO2 25 22 - 32 mmol/L   Glucose, Bld 104 (H) 70 - 99 mg/dL    Comment: Glucose reference range applies only to samples taken after fasting for at least 8 hours.   BUN 10 8 - 23 mg/dL   Creatinine, Ser 8.76 0.61 - 1.24 mg/dL   Calcium  8.5 (L) 8.9 - 10.3 mg/dL   GFR, Estimated >39 >39 mL/min    Comment: (NOTE) Calculated using the CKD-EPI Creatinine Equation (2021)    Anion gap 7 5 - 15    Comment: Performed at [facility], [address] [place], [place] [address]    ECHOCARDIOGRAM COMPLETE Result Date: 10/21/2023    ECHOCARDIOGRAM REPORT   Patient Name:   BEVERLEY SHERRARD Date of Exam: 10/21/2023 Medical Rec #:  [id number]         Height:       71.0 in Accession #:    [id number]        Weight:       178.6 lb Date of Birth:  10-29-1950         BSA:          2.009 m Patient Age:    73 years          BP:           144/96 mmHg Patient Gender: M                 HR:           51 bpm. Exam Location:  Inpatient Procedure: 2D Echo, Cardiac Doppler and Color Doppler (Both Spectral and Color            Flow Doppler were utilized during procedure). Indications:    Endocarditis I38  History:        Patient has prior history of Echocardiogram examinations, most                 recent 09/03/2023. CAD, Prior CABG, Stroke; Risk                 Factors:Hypertension.  Sonographer:    Jayson Gaskins Referring Phys: [phone] STEPHANIE N DIXON IMPRESSIONS  1. Left ventricular ejection fraction, by estimation, is 50 to 55%. The left ventricle has low normal function. The left ventricle has no regional wall motion abnormalities. Left ventricular diastolic parameters are consistent with Grade I diastolic dysfunction (impaired relaxation).  2. Right ventricular systolic  function is mildly reduced. The right ventricular size is normal.  3. The mitral valve is grossly normal. Trivial mitral valve regurgitation.  4. The aortic valve is tricuspid. There is mild calcification of the aortic valve. Aortic valve regurgitation is not visualized. Comparison(s): Prior images reviewed side by side. 09/02/2013: LVEF 55-60%. Compared to this prior study the mitral leaflets appear more thickened, but there is no obvious mobile vegetation. Conclusion(s)/Recommendation(s): No evidence of valvular vegetations on this transthoracic echocardiogram. Consider a transesophageal echocardiogram to exclude infective endocarditis if clinically indicated. FINDINGS  Left Ventricle: Left ventricular ejection fraction, by estimation, is 50 to 55%. The left ventricle has low normal function. The left ventricle has no regional wall motion abnormalities. The  left ventricular internal cavity size was normal in size. There is no left ventricular hypertrophy. Left ventricular diastolic parameters are consistent with Grade I diastolic dysfunction (impaired relaxation). Indeterminate filling pressures. Right Ventricle: The right ventricular size is normal. No increase in right ventricular wall thickness. Right ventricular systolic function is mildly reduced. Left Atrium: Left atrial size was normal in size. Right Atrium: Right atrial size was normal in size. Pericardium: There is no evidence of pericardial effusion. Mitral Valve: The mitral valve is grossly normal. There is mild thickening of the anterior and posterior mitral valve leaflet(s). Trivial mitral valve regurgitation. Tricuspid Valve: The tricuspid valve is grossly normal. Tricuspid valve regurgitation is trivial. Aortic Valve: The aortic valve is tricuspid. There is mild calcification of the aortic valve. Aortic valve regurgitation is not visualized. Aortic valve mean gradient measures 4.0 mmHg. Aortic valve peak gradient measures 7.2 mmHg. Aortic valve area,  by VTI measures 2.76 cm. Pulmonic Valve: The pulmonic valve was not well visualized. Pulmonic valve regurgitation is not visualized. Aorta: The aortic root is normal in size and structure. Venous: The inferior vena cava was not well visualized. IAS/Shunts: No atrial level shunt detected by color flow Doppler.  LEFT VENTRICLE PLAX 2D LVIDd:         5.00 cm   Diastology LVIDs:         3.50 cm   LV e' medial:    8.70 cm/s LV PW:         1.00 cm   LV E/e' medial:  8.2 LV IVS:        0.70 cm   LV e' lateral:   5.87 cm/s LVOT diam:     1.90 cm   LV E/e' lateral: 12.1 LV SV:         63 LV SV Index:   31 LVOT Area:     2.84 cm  RIGHT VENTRICLE RV S prime:     7.18 cm/s TAPSE (M-mode): 1.8 cm LEFT ATRIUM           Index        RIGHT ATRIUM           Index LA Vol (A4C): 52.5 ml 26.13 ml/m  RA Area:     18.20 cm                                    RA Volume:   47.10 ml  23.44 ml/m  AORTIC VALVE AV Area (Vmax):    2.43 cm AV Area (Vmean):   2.71 cm AV Area (VTI):     2.76 cm AV Vmax:           134.00 cm/s AV Vmean:          89.900 cm/s AV VTI:            0.229 m AV Peak Grad:      7.2 mmHg AV Mean Grad:      4.0 mmHg LVOT Vmax:         115.00 cm/s LVOT Vmean:        86.000 cm/s LVOT VTI:          0.223 m LVOT/AV VTI ratio: 0.97  AORTA Ao Root diam: 2.90 cm MITRAL VALVE MV Area (PHT): 2.87 cm    SHUNTS MV Decel Time: 264 msec    Systemic VTI:  0.22 m MV E velocity: 71.30 cm/s  Systemic Diam: 1.90  cm MV A velocity: 83.30 cm/s MV E/A ratio:  0.86 Vinie Maxcy MD Electronically signed by Vinie Maxcy MD Signature Date/Time: 10/21/2023/2:30:48 PM    Final    MR Shoulder Left W Wo Contrast Result Date: 10/20/2023 EXAM: MRI OF THE LEFT SHOULDER WITHOUT AND WITH CONTRAST 10/19/2023 07:31:02 PM TECHNIQUE: Multiplanar multisequence MRI of the left shoulder was performed without and with the administration of 8 mL of gadobutrol (GADAVIST) 1 MMOL/ML injection. COMPARISON: Shoulder radiographs 10/19/2023. CLINICAL HISTORY:  Osteomyelitis suspected, shoulder. X-ray done. 8mL gadavist administered. FINDINGS: ROTATOR CUFF: Small partial-thickness articular surface tear of the posterior supraspinatus tendon on image 15 series 6. Subtle edema inferiorly in the supraspinatus muscle on image 13 series 6 potentially from mild muscle strain. Intact infraspinatus, subscapularis and teres minor tendons. No significant muscle edema or atrophy in the infraspinatus, subscapularis, and teres minor. BICEPS TENDON: Long head biceps tendon is intact and normally located. LABRUM: The glenoid labrum appears grossly intact. No paralabral cyst. GLENOHUMERAL JOINT: Physiologic amount of joint fluid. No high-grade cartilage loss. Normal alignment. AC JOINT AND ACROMIOCLAVICULAR ARCH: Mild degenerative arthropathy of the AC joint. Type 2 (curve) subacromial morphology. No significant acromial downsloping or subacromial spur. Intact acromioclavicular and coracoclavicular ligaments. Trace edema along the coracoclavicular ligament without overt tear. BURSA: Trace subacromial subdeltoid bursitis without significant synovitis. BONE MARROW: No acute fracture or aggressive marrow replacing lesion. OUTLET SPACES: Normal MRI appearance of the quadrilateral space. No significant narrowing of the supraspinatus outlet. SOFT TISSUES: Diffuse edema and mild enhancement in the subclavius muscle may reflect a strain, inflammation, or denervation edema. The sternoclavicular joint was not included on today's exam, but if the patient has pain or tenderness over the sternoclavicular joint, additional imaging directed towards the sternoclavicular joint will be suggested. No focal abnormality of the subcutaneous soft tissues. IMPRESSION: 1. Small partial-thickness articular surface tear of the posterior supraspinatus tendon. 2. Diffuse subclavius muscular edema and mild enhancement, which may reflect strain, inflammation, or denervation edema. 3. Mild degenerative arthropathy of  the [facility] joint with type 2 subacromial morphology. 4. Trace subacromial subdeltoid bursitis. 5. Trace edema along the coracoclavicular ligament without tear. 6. Subtle inferior supraspinatus muscular edema, suggestive of mild strain. Electronically signed by: Ryan Salvage MD 10/20/2023 09:10 AM EDT RP Workstation: [id number]   MR LUMBAR SPINE W WO CONTRAST Result Date: 10/19/2023 CLINICAL DATA:  Initial evaluation for acute osteomyelitis. EXAM: MRI LUMBAR SPINE WITHOUT AND WITH CONTRAST TECHNIQUE: Multiplanar and multiecho pulse sequences of the lumbar spine were obtained without and with intravenous contrast. CONTRAST:  8mL GADAVIST GADOBUTROL 1 MMOL/ML IV SOLN COMPARISON:  Prior MRI from 09/01/2023. FINDINGS: Segmentation: Transitional lumbosacral anatomy with lumbarization of the S1 segment and fairly well-formed S1-2 interspace is seen. Vertebral body count is made from the dens on this exam. Alignment: 3 mm retrolisthesis of L1 on L2, with trace degenerative retrolisthesis of L2 on L3 through L4-5. 3 mm anterolisthesis of L5 on S1. Findings chronic and degenerative. Vertebrae: Vertebral body height maintained without acute or chronic fracture. Changes of acute osteomyelitis discitis partially visualized at the level of T11-12, described on corresponding MRI of the thoracic spine. No other evidence for acute osteomyelitis discitis or septic arthritis within the lumbar spine. Bone marrow signal intensity within normal limits. No worrisome osseous lesions. No other abnormal marrow edema or enhancement. Conus medullaris and cauda equina: Conus extends to the L1 level. Conus and cauda equina appear normal. Paraspinal and other soft tissues: Diffuse edema present within the posterior paraspinous musculature of  the lower back (series 6, images 17, 1). Findings are nonspecific, but could reflect changes of acute myositis. Possible muscular injury/strain could also be considered. No collections. Multiple T2  hyperintense cyst noted about the visualized kidneys, benign in appearance, no follow-up imaging recommended. Disc levels: L1-2: Retrolisthesis with diffuse disc bulge and disc desiccation. Superimposed small right subarticular disc protrusion. Mild facet hypertrophy. No significant spinal stenosis. Mild left L1 foraminal narrowing. Right neural foramen remains patent. L2-3: Diffuse disc bulge with disc desiccation. Reactive endplate spurring, greater on the left. Mild facet hypertrophy with trace joint effusions. No significant spinal stenosis. Mild left L2 foraminal narrowing. Right neural foramen remains patent. L3-4: Degenerate intervertebral disc space narrowing with disc desiccation diffuse disc bulge, asymmetric to the right. Reactive endplate spurring, also worse on the right. Mild to moderate bilateral facet hypertrophy. Resultant mild narrowing of the right lateral recess. Central canal remains patent. Moderate right L3 foraminal stenosis for the left neural foramen remains patent. L4-5: Degenerative disc space narrowing with disc desiccation diffuse disc bulge. Small right extraforaminal disc protrusion closely approximates the exiting right L4 nerve root. Mild facet and ligament flavum hypertrophy. No significant spinal stenosis. Mild bilateral L4 foraminal narrowing. L5-S1: Anterolisthesis. Disc desiccation with diffuse disc bulge. Moderate bilateral facet arthrosis. No significant spinal stenosis. Mild bilateral L5 foraminal narrowing. S1-2: Transitional lumbosacral anatomy with a well-formed S1-2 interspace. No disc bulge or focal disc herniation. Mild right worse than left facet arthrosis. No stenosis. IMPRESSION: 1. No MRI evidence for acute osteomyelitis discitis or septic arthritis within the lumbar spine. 2. Diffuse edema within the posterior paraspinous musculature of the lower back, nonspecific, but could reflect changes of acute myositis. Possible muscular injury/strain could also be  considered. No collections. 3. Underlying multilevel degenerative spondylosis and facet arthrosis as above. No significant spinal stenosis. Mild to moderate bilateral L3 through L5 foraminal narrowing as above. 4. Changes of acute osteomyelitis discitis at T11-12, described on corresponding MRI of the thoracic spine. Electronically Signed   By: Morene Hoard M.D.   On: 10/19/2023 20:18   MR THORACIC SPINE W WO CONTRAST Result Date: 10/19/2023 CLINICAL DATA:  Initial evaluation for thoracic osteomyelitis. EXAM: MRI THORACIC WITHOUT AND WITH CONTRAST TECHNIQUE: Multiplanar and multiecho pulse sequences of the thoracic spine were obtained without and with intravenous contrast. CONTRAST:  8mL GADAVIST GADOBUTROL 1 MMOL/ML IV SOLN COMPARISON:  None Available. FINDINGS: Alignment: Exaggeration of the normal thoracic kyphosis. No significant listhesis. Vertebrae: Findings consistent with acute osteomyelitis discitis at T11-12. Associated exuberant reactive marrow edema and enhancement. Endplate irregularity about the T11-12 interspace, with mild height loss about the T11 and T12 vertebral bodies. Mild paraspinous edema/inflammation but no loculated paraspinous collections. No significant epidural involvement at this time. No other evidence for distant infection elsewhere within the thoracic spine. Vertebral body height otherwise maintained. Bone marrow signal intensity within normal limits. No worrisome osseous lesions. Cord:  Normal signal and morphology. Paraspinal and other soft tissues: Mild paraspinous inflammation adjacent to the T11-12 interspace. Small right pleural effusion. Ovoid T1/T2 hyperintense lesion at the posterior right pleural space measures 3.8 x 2.6 cm (series 20, image 32), nonspecific, but possibly a loculated complex effusion. Few small T2 hyperintense left renal cyst noted, benign in appearance, no follow-up imaging recommended. Disc levels: T1-2:  Unremarkable. T2-3: Unremarkable. T3-4:   Small right paracentral disc protrusion.  No stenosis. T4-5:  Small central disc protrusion without stenosis. T5-6:  Small right paracentral disc protrusion without stenosis. T6-7:  Small central disc protrusion  without significant stenosis. T7-8: Tiny left paracentral disc protrusion without significant stenosis. T8-9: Central disc protrusion indents the ventral thecal sac. Minimal cord flattening without cord signal changes or significant spinal stenosis. Mild right-sided facet hypertrophy. Foramina remain patent. T9-10: Mild disc bulge with facet hypertrophy.  No stenosis. T10-11: Negative interspace. Mild right greater left facet hypertrophy. No stenosis. T11-12: Findings consistent with osteomyelitis discitis. No significant epidural involvement. No spinal stenosis. Foramina remain grossly patent. T12-L1:  Negative interspace.  Mild facet hypertrophy.  No stenosis. IMPRESSION: 1. Findings consistent with acute osteomyelitis discitis at T11-12. No significant epidural involvement at this time. 2. Small right pleural effusion with 3.8 x 2.6 cm ovoid T1/T2 hyperintense lesion at the posterior right pleural space, nonspecific, but possibly a loculated complex effusion. Correlation with dedicated chest imaging suggested for further evaluation. 3. Underlying multilevel thoracic spondylosis as above without significant stenosis. Electronically Signed   By: Morene Hoard M.D.   On: 10/19/2023 19:58    Pertinent items noted in HPI and remainder of comprehensive ROS otherwise negative. Blood pressure 130/80, pulse (!) 59, temperature 98.2 F (36.8 C), temperature source Oral, resp. rate 18, height 5' 11 (1.803 m), weight 81 kg, SpO2 96%. Patient awake and alert.  He is oriented and appropriate.  Speech is fluent.  Judgment insight are intact.  Cranial nerve function normal bilateral.  Motor examination with intact motor strength bilateral.  Sensory examination nonfocal.  Reflexes normal active.  No evidence  of long track signs.  Examination head ears eyes nose throat is unremarkable chest and abdomen benign.  Extremities are free of major deformity.  Assessment/Plan: Partially treated/worsening T11-T12 osteomyelitis discitis without significant epidural abscess.  I would probably recommend aspiration of the T11-12 disc space by interventional radiology for cultures although is likely organism is similar to what was present with his pleural empyema as I believe these processes are related.  No indication for surgical intervention at this point.  Continue efforts at pain control and mobilization.  Patient will require IV antibiotics for at least 6 more weeks.  Recommend infectious disease evaluation.  [facility] Parys Elenbaas 10/21/2023, 5:32 PM

## 2023-10-21 NOTE — Plan of Care (Signed)

## 2023-10-21 NOTE — Progress Notes (Signed)
 Progress Note   Patient: Jacob [name] FMW:[id number] DOB: 08-27-1950 DOA: 10/19/2023     2 DOS: the patient was seen and examined on 10/21/2023   Brief hospital course: KISHON GARRIGA is a 73 y.o. male with medical history significant for CAD s/p CABG, CAS s/p right CEA, PAF not on AC, HTN, Crohn's disease, right MSSA empyema who is admitted with acute osteomyelitis discitis of T11-T12.  This was seen on outpatient MRI 9/11, read on 9/19 and referred to ED from Pulmonology follow up appointment due to worsening back and left shoulder pain in the setting of abnormal MRI findings.  Of note, pt had recent MSSA empyema during admission in August. See H&P for full HPI on admission & ED course.   Assessment and Plan:  Acute osteomyelitis discitis at T11-12: Presumed due to MSSA given recent empyema.   --Consult ID, appreciate recommendations --Neurosurgery was consulted in the ED, recs pending. Secure chat sent to Dr. Louis --Continue IV Ancef   --Follow blood cultures --2D echo pending, may need TEE --May need IR vs Neurosurgery to obtain biopsy for culture   History of right MSSA empyema: admitted in August.  Has since completed course of oral antibiotics with Augmentin  as ID recommended that admission.  Imaging shows improvement in pleural effusion, small residual fluid remains. Seen  in Pulmonology clinic follow up 9/30 --EDP discussed case with Dr. Claudene.  Empyema felt to be improving.   Left shoulder pain: Patient reports pain limiting range of motion of left shoulder.  Pain seems to primarily be at the [facility] joint area.  He denies any injury.  Concern is for possible septic arthritis.   MRI left shoulder shows small partial-thickness tear of the posterior supraspinatus tendon, diffuse subclavius muscular edema and mild enhancement which may reflect strain, inflammation or denervation edema... --Orthopedics consulted --pain control PRN --PT/OT   Paroxysmal atrial fibrillation: NSR  on admission. --Pt has been off anticoagulation due to recent acute blood loss anemia from hemothorax.  --Continue amiodarone  and Toprol -XL --Continue on aspirin  81 mg daily for now.   Recent ABLA due to hemothorax while on heparin  anticoagulation: Hemoglobin is stable at 9.6.   --Monitor CBC --Monitor for bleeding   CAD s/p CABG: --Continue aspirin , Toprol -XL, atorvastatin .   Hypertension: BP's controlled, soft this AM --Continue Toprol -XL  --Holding lisinopril  and amlodipine  - resume when BP indicates   Crohn's disease: Quiescent for many years, not on disease-specific therapy.      Subjective: Pt seen sitting up in bed, family at bedside.  He reports ongoing left shoulder pain today.  He asks about neurosurgery seeing him.  He had a fall onto his left shoulder and has struggled with pain since then.   Physical Exam: Vitals:   10/20/23 2036 10/21/23 0532 10/21/23 0953 10/21/23 1021  BP: 127/76 115/76 137/86 (!) 144/96  Pulse: 65 63 66 74  Resp: 17 17  18   Temp: 98.3 F (36.8 C) 97.9 F (36.6 C) 97.6 F (36.4 C) 97.8 F (36.6 C)  TempSrc: Oral  Oral   SpO2: 92% 91% 98% 100%  Weight:      Height:       General exam: awake, alert, no acute distress HEENT: moist mucus membranes, hearing grossly normal  Respiratory system: on room air, normal respiratory effort. Cardiovascular system: normal S1/S2, RRR, no pedal edema.   Gastrointestinal system: soft, NT, ND Central nervous system: A&O x 3. no gross focal neurologic deficits, normal speech Extremities: moves all, no edema, normal tone  Skin: dry, intact, normal temperature Psychiatry: normal mood, congruent affect, judgement and insight appear normal  Data Reviewed:  Notable labs -- Na 134, glucose 104, Ca 8.5, Hbg 11.0 >> 9.6 >> 8.9 >> 8.8  Family Communication: at bedside on rounds  Disposition: Status is: Inpatient Remains inpatient appropriate because: remains on IV antibiotics, ongoing evaluation as per  ID.   Planned Discharge Destination: Home with Home Health    Time spent: 42 minutes  Author: Burnard DELENA Cunning, DO 10/21/2023 2:24 PM  For on call review [email or web address].

## 2023-10-21 NOTE — Consult Note (Signed)
 Reason for Consult:Left shoulder pain Referring Physician: Burnard Cunning Time called: 1435 Time at bedside: 1449   Jacob [name] is an 73 y.o. male.  HPI: Joud was admitted 2d ago with thoracic discitis. He was in the hospital for about 10d in early September. Shortly after discharge, about 2w ago, he fell and hurt his left shoulder. It's been hurting ever since but is no worse or better. He notes the pain is worse with use or certain movements and waxes and wanes. He is RHD and denies prior hx/o shoulder pain. He underwent MRI that showed some mild degerative changes as well as a small cuff tear and orthopedic surgery was consulted.   Past Medical History:  Diagnosis Date  . CAD (coronary artery disease), native coronary artery     multivessel ASCAD s/p CABG and s/p PCI of SVG to RI 2008  . Carotid artery stenosis    patent right CEA site and 1-39% Lcarotid stenosis  . Crohn's disease (HCC)    flare 2000  . Depression   . Dyslipidemia   . Dyspnea   . ED (erectile dysfunction)   . History of blood transfusion 2005  . Hypertension   . Myocardial infarction (HCC) 2005   5 bypasses  . Nephrolithiasis   . Stroke [facility])    TIA- Endartarectemy R side  . TIA (transient ischemic attack)   . Ulcerative colitis (HCC)    Left sided    Past Surgical History:  Procedure Laterality Date  . CARDIAC CATHETERIZATION  06/08   Patnet grafts LAD, marginal, distal right  . carpel tunnel surgery Left   . COLONOSCOPY    . COLONOSCOPY WITH PROPOFOL  N/A 10/25/2012   Procedure: COLONOSCOPY WITH PROPOFOL ;  Surgeon: Gladis MARLA Louder, MD;  Location: [facility];  Service: Endoscopy;  Laterality: N/A;  . CORONARY ARTERY BYPASS GRAFT  2005   ASCVD,multivessel,S/P CABG  x5 total per patient  . ENDARTERECTOMY Right 06/24/2016   Procedure: ENDARTERECTOMY CAROTID;  Surgeon: Marea Selinda RAMAN, MD;  Location: [facility] ORS;  Service: Vascular;  Laterality: Right;  . HERNIA REPAIR  many years ago  . IR THORACENTESIS  ASP PLEURAL SPACE W/IMG GUIDE  09/02/2023  . KNEE ARTHROSCOPY Right 1995    Family History  Problem Relation Age of Onset  . Ovarian cancer Mother   . CAD Father   . CAD Sister   . CAD Brother   . CAD Brother   . CAD Brother   . Colon cancer Neg Hx   . Esophageal cancer Neg Hx   . Stomach cancer Neg Hx   . Rectal cancer Neg Hx   . Colon polyps Neg Hx     Social History:  reports that he has never smoked. He has quit using smokeless tobacco.  His smokeless tobacco use included snuff. He reports that he does not drink alcohol and does not use drugs.  Allergies: No Known Allergies  Medications: I have reviewed the patient's current medications.  Results for orders placed or performed during the hospital encounter of 10/19/23 (from the past 48 hours)  Culture, blood (Routine X 2) w Reflex to ID Panel     Status: None (Preliminary result)   Collection Time: 10/19/23  9:40 PM   Specimen: BLOOD LEFT WRIST  Result Value Ref Range   Specimen Description BLOOD LEFT WRIST    Special Requests      BOTTLES DRAWN AEROBIC AND ANAEROBIC Blood Culture adequate volume   Culture      NO  GROWTH 2 DAYS Performed at [facility], [address] [place], [place] [address]    Report Status PENDING   Culture, blood (Routine X 2) w Reflex to ID Panel     Status: None (Preliminary result)   Collection Time: 10/19/23  9:50 PM   Specimen: BLOOD RIGHT FOREARM  Result Value Ref Range   Specimen Description BLOOD RIGHT FOREARM    Special Requests      BOTTLES DRAWN AEROBIC AND ANAEROBIC Blood Culture results may not be optimal due to an inadequate volume of blood received in culture bottles   Culture      NO GROWTH 2 DAYS Performed at [facility], [address], [place], [place] [address]    Report Status PENDING   Basic metabolic panel     Status: Abnormal   Collection Time: 10/20/23  6:15 AM  Result Value Ref Range   Sodium 136 135 - 145 mmol/L   Potassium 3.9 3.5 - 5.1 mmol/L    Chloride 102 98 - 111 mmol/L   CO2 24 22 - 32 mmol/L   Glucose, Bld 101 (H) 70 - 99 mg/dL    Comment: Glucose reference range applies only to samples taken after fasting for at least 8 hours.   BUN 13 8 - 23 mg/dL   Creatinine, Ser 8.94 0.61 - 1.24 mg/dL   Calcium  8.5 (L) 8.9 - 10.3 mg/dL   GFR, Estimated >39 >39 mL/min    Comment: (NOTE) Calculated using the CKD-EPI Creatinine Equation (2021)    Anion gap 10 5 - 15    Comment: Performed at [facility], [address], [place], [place] [address]  CBC     Status: Abnormal   Collection Time: 10/20/23  6:15 AM  Result Value Ref Range   WBC 7.6 4.0 - 10.5 K/uL   RBC 3.09 (L) 4.22 - 5.81 MIL/uL   Hemoglobin 8.9 (L) 13.0 - 17.0 g/dL   HCT 71.9 (L) 60.9 - 47.9 %   MCV 90.6 80.0 - 100.0 fL   MCH 28.8 26.0 - 34.0 pg   MCHC 31.8 30.0 - 36.0 g/dL   RDW 85.8 88.4 - 84.4 %   Platelets 277 150 - 400 K/uL   nRBC 0.0 0.0 - 0.2 %    Comment: Performed at [facility], [address] [place], [place] [address]  C-reactive protein     Status: Abnormal   Collection Time: 10/20/23 10:02 AM  Result Value Ref Range   CRP 14.6 (H) <1.0 mg/dL    Comment: Performed at [facility], [address], [place], [place] [address]  CBC     Status: Abnormal   Collection Time: 10/21/23  2:56 AM  Result Value Ref Range   WBC 6.7 4.0 - 10.5 K/uL   RBC 3.11 (L) 4.22 - 5.81 MIL/uL   Hemoglobin 8.8 (L) 13.0 - 17.0 g/dL   HCT 71.9 (L) 60.9 - 47.9 %   MCV 90.0 80.0 - 100.0 fL   MCH 28.3 26.0 - 34.0 pg   MCHC 31.4 30.0 - 36.0 g/dL   RDW 85.9 88.4 - 84.4 %   Platelets 293 150 - 400 K/uL   nRBC 0.0 0.0 - 0.2 %    Comment: Performed at [facility], [address] [place], [place] [address]  Basic metabolic panel with GFR     Status: Abnormal   Collection Time: 10/21/23  2:56 AM  Result Value Ref  Range   Sodium 134 (L) 135 - 145 mmol/L   Potassium 3.5 3.5 - 5.1 mmol/L   Chloride 102 98 - 111 mmol/L   CO2 25 22 - 32 mmol/L    Glucose, Bld 104 (H) 70 - 99 mg/dL    Comment: Glucose reference range applies only to samples taken after fasting for at least 8 hours.   BUN 10 8 - 23 mg/dL   Creatinine, Ser 8.76 0.61 - 1.24 mg/dL   Calcium  8.5 (L) 8.9 - 10.3 mg/dL   GFR, Estimated >39 >39 mL/min    Comment: (NOTE) Calculated using the CKD-EPI Creatinine Equation (2021)    Anion gap 7 5 - 15    Comment: Performed at [facility], [address] [place], [place] [address]    ECHOCARDIOGRAM COMPLETE Result Date: 10/21/2023    ECHOCARDIOGRAM REPORT   Patient Name:   DAILY CRATE Date of Exam: 10/21/2023 Medical Rec #:  [id number]         Height:       71.0 in Accession #:    [id number]        Weight:       178.6 lb Date of Birth:  1950-05-05         BSA:          2.009 m Patient Age:    73 years          BP:           144/96 mmHg Patient Gender: M                 HR:           51 bpm. Exam Location:  Inpatient Procedure: 2D Echo, Cardiac Doppler and Color Doppler (Both Spectral and Color            Flow Doppler were utilized during procedure). Indications:    Endocarditis I38  History:        Patient has prior history of Echocardiogram examinations, most                 recent 09/03/2023. CAD, Prior CABG, Stroke; Risk                 Factors:Hypertension.  Sonographer:    Jayson Gaskins Referring Phys: [phone] STEPHANIE N DIXON IMPRESSIONS  1. Left ventricular ejection fraction, by estimation, is 50 to 55%. The left ventricle has low normal function. The left ventricle has no regional wall motion abnormalities. Left ventricular diastolic parameters are consistent with Grade I diastolic dysfunction (impaired relaxation).  2. Right ventricular systolic function is mildly reduced. The right ventricular size is normal.  3. The mitral valve is grossly normal. Trivial mitral valve regurgitation.  4. The aortic valve is tricuspid. There is mild calcification of the aortic valve. Aortic valve regurgitation is not visualized.  Comparison(s): Prior images reviewed side by side. 09/02/2013: LVEF 55-60%. Compared to this prior study the mitral leaflets appear more thickened, but there is no obvious mobile vegetation. Conclusion(s)/Recommendation(s): No evidence of valvular vegetations on this transthoracic echocardiogram. Consider a transesophageal echocardiogram to exclude infective endocarditis if clinically indicated. FINDINGS  Left Ventricle: Left ventricular ejection fraction, by estimation, is 50 to 55%. The left ventricle has low normal function. The left ventricle has no regional wall motion abnormalities. The left ventricular internal cavity size was normal in size. There is no left ventricular hypertrophy. Left ventricular diastolic parameters are consistent with Grade I diastolic dysfunction (impaired relaxation). Indeterminate  filling pressures. Right Ventricle: The right ventricular size is normal. No increase in right ventricular wall thickness. Right ventricular systolic function is mildly reduced. Left Atrium: Left atrial size was normal in size. Right Atrium: Right atrial size was normal in size. Pericardium: There is no evidence of pericardial effusion. Mitral Valve: The mitral valve is grossly normal. There is mild thickening of the anterior and posterior mitral valve leaflet(s). Trivial mitral valve regurgitation. Tricuspid Valve: The tricuspid valve is grossly normal. Tricuspid valve regurgitation is trivial. Aortic Valve: The aortic valve is tricuspid. There is mild calcification of the aortic valve. Aortic valve regurgitation is not visualized. Aortic valve mean gradient measures 4.0 mmHg. Aortic valve peak gradient measures 7.2 mmHg. Aortic valve area, by VTI measures 2.76 cm. Pulmonic Valve: The pulmonic valve was not well visualized. Pulmonic valve regurgitation is not visualized. Aorta: The aortic root is normal in size and structure. Venous: The inferior vena cava was not well visualized. IAS/Shunts: No atrial  level shunt detected by color flow Doppler.  LEFT VENTRICLE PLAX 2D LVIDd:         5.00 cm   Diastology LVIDs:         3.50 cm   LV e' medial:    8.70 cm/s LV PW:         1.00 cm   LV E/e' medial:  8.2 LV IVS:        0.70 cm   LV e' lateral:   5.87 cm/s LVOT diam:     1.90 cm   LV E/e' lateral: 12.1 LV SV:         63 LV SV Index:   31 LVOT Area:     2.84 cm  RIGHT VENTRICLE RV S prime:     7.18 cm/s TAPSE (M-mode): 1.8 cm LEFT ATRIUM           Index        RIGHT ATRIUM           Index LA Vol (A4C): 52.5 ml 26.13 ml/m  RA Area:     18.20 cm                                    RA Volume:   47.10 ml  23.44 ml/m  AORTIC VALVE AV Area (Vmax):    2.43 cm AV Area (Vmean):   2.71 cm AV Area (VTI):     2.76 cm AV Vmax:           134.00 cm/s AV Vmean:          89.900 cm/s AV VTI:            0.229 m AV Peak Grad:      7.2 mmHg AV Mean Grad:      4.0 mmHg LVOT Vmax:         115.00 cm/s LVOT Vmean:        86.000 cm/s LVOT VTI:          0.223 m LVOT/AV VTI ratio: 0.97  AORTA Ao Root diam: 2.90 cm MITRAL VALVE MV Area (PHT): 2.87 cm    SHUNTS MV Decel Time: 264 msec    Systemic VTI:  0.22 m MV E velocity: 71.30 cm/s  Systemic Diam: 1.90 cm MV A velocity: 83.30 cm/s MV E/A ratio:  0.86 Vinie Maxcy MD Electronically signed by Vinie Maxcy MD Signature Date/Time: 10/21/2023/2:30:48 PM    Final  MR Shoulder Left W Wo Contrast Result Date: 10/20/2023 EXAM: MRI OF THE LEFT SHOULDER WITHOUT AND WITH CONTRAST 10/19/2023 07:31:02 PM TECHNIQUE: Multiplanar multisequence MRI of the left shoulder was performed without and with the administration of 8 mL of gadobutrol (GADAVIST) 1 MMOL/ML injection. COMPARISON: Shoulder radiographs 10/19/2023. CLINICAL HISTORY: Osteomyelitis suspected, shoulder. X-ray done. 8mL gadavist administered. FINDINGS: ROTATOR CUFF: Small partial-thickness articular surface tear of the posterior supraspinatus tendon on image 15 series 6. Subtle edema inferiorly in the supraspinatus muscle on image 13  series 6 potentially from mild muscle strain. Intact infraspinatus, subscapularis and teres minor tendons. No significant muscle edema or atrophy in the infraspinatus, subscapularis, and teres minor. BICEPS TENDON: Long head biceps tendon is intact and normally located. LABRUM: The glenoid labrum appears grossly intact. No paralabral cyst. GLENOHUMERAL JOINT: Physiologic amount of joint fluid. No high-grade cartilage loss. Normal alignment. AC JOINT AND ACROMIOCLAVICULAR ARCH: Mild degenerative arthropathy of the AC joint. Type 2 (curve) subacromial morphology. No significant acromial downsloping or subacromial spur. Intact acromioclavicular and coracoclavicular ligaments. Trace edema along the coracoclavicular ligament without overt tear. BURSA: Trace subacromial subdeltoid bursitis without significant synovitis. BONE MARROW: No acute fracture or aggressive marrow replacing lesion. OUTLET SPACES: Normal MRI appearance of the quadrilateral space. No significant narrowing of the supraspinatus outlet. SOFT TISSUES: Diffuse edema and mild enhancement in the subclavius muscle may reflect a strain, inflammation, or denervation edema. The sternoclavicular joint was not included on today's exam, but if the patient has pain or tenderness over the sternoclavicular joint, additional imaging directed towards the sternoclavicular joint will be suggested. No focal abnormality of the subcutaneous soft tissues. IMPRESSION: 1. Small partial-thickness articular surface tear of the posterior supraspinatus tendon. 2. Diffuse subclavius muscular edema and mild enhancement, which may reflect strain, inflammation, or denervation edema. 3. Mild degenerative arthropathy of the [facility] joint with type 2 subacromial morphology. 4. Trace subacromial subdeltoid bursitis. 5. Trace edema along the coracoclavicular ligament without tear. 6. Subtle inferior supraspinatus muscular edema, suggestive of mild strain. Electronically signed by: Ryan Salvage MD 10/20/2023 09:10 AM EDT RP Workstation: [id number]   MR LUMBAR SPINE W WO CONTRAST Result Date: 10/19/2023 CLINICAL DATA:  Initial evaluation for acute osteomyelitis. EXAM: MRI LUMBAR SPINE WITHOUT AND WITH CONTRAST TECHNIQUE: Multiplanar and multiecho pulse sequences of the lumbar spine were obtained without and with intravenous contrast. CONTRAST:  8mL GADAVIST GADOBUTROL 1 MMOL/ML IV SOLN COMPARISON:  Prior MRI from 09/01/2023. FINDINGS: Segmentation: Transitional lumbosacral anatomy with lumbarization of the S1 segment and fairly well-formed S1-2 interspace is seen. Vertebral body count is made from the dens on this exam. Alignment: 3 mm retrolisthesis of L1 on L2, with trace degenerative retrolisthesis of L2 on L3 through L4-5. 3 mm anterolisthesis of L5 on S1. Findings chronic and degenerative. Vertebrae: Vertebral body height maintained without acute or chronic fracture. Changes of acute osteomyelitis discitis partially visualized at the level of T11-12, described on corresponding MRI of the thoracic spine. No other evidence for acute osteomyelitis discitis or septic arthritis within the lumbar spine. Bone marrow signal intensity within normal limits. No worrisome osseous lesions. No other abnormal marrow edema or enhancement. Conus medullaris and cauda equina: Conus extends to the L1 level. Conus and cauda equina appear normal. Paraspinal and other soft tissues: Diffuse edema present within the posterior paraspinous musculature of the lower back (series 6, images 17, 1). Findings are nonspecific, but could reflect changes of acute myositis. Possible muscular injury/strain could also be considered. No collections. Multiple T2 hyperintense cyst  noted about the visualized kidneys, benign in appearance, no follow-up imaging recommended. Disc levels: L1-2: Retrolisthesis with diffuse disc bulge and disc desiccation. Superimposed small right subarticular disc protrusion. Mild facet hypertrophy. No  significant spinal stenosis. Mild left L1 foraminal narrowing. Right neural foramen remains patent. L2-3: Diffuse disc bulge with disc desiccation. Reactive endplate spurring, greater on the left. Mild facet hypertrophy with trace joint effusions. No significant spinal stenosis. Mild left L2 foraminal narrowing. Right neural foramen remains patent. L3-4: Degenerate intervertebral disc space narrowing with disc desiccation diffuse disc bulge, asymmetric to the right. Reactive endplate spurring, also worse on the right. Mild to moderate bilateral facet hypertrophy. Resultant mild narrowing of the right lateral recess. Central canal remains patent. Moderate right L3 foraminal stenosis for the left neural foramen remains patent. L4-5: Degenerative disc space narrowing with disc desiccation diffuse disc bulge. Small right extraforaminal disc protrusion closely approximates the exiting right L4 nerve root. Mild facet and ligament flavum hypertrophy. No significant spinal stenosis. Mild bilateral L4 foraminal narrowing. L5-S1: Anterolisthesis. Disc desiccation with diffuse disc bulge. Moderate bilateral facet arthrosis. No significant spinal stenosis. Mild bilateral L5 foraminal narrowing. S1-2: Transitional lumbosacral anatomy with a well-formed S1-2 interspace. No disc bulge or focal disc herniation. Mild right worse than left facet arthrosis. No stenosis. IMPRESSION: 1. No MRI evidence for acute osteomyelitis discitis or septic arthritis within the lumbar spine. 2. Diffuse edema within the posterior paraspinous musculature of the lower back, nonspecific, but could reflect changes of acute myositis. Possible muscular injury/strain could also be considered. No collections. 3. Underlying multilevel degenerative spondylosis and facet arthrosis as above. No significant spinal stenosis. Mild to moderate bilateral L3 through L5 foraminal narrowing as above. 4. Changes of acute osteomyelitis discitis at T11-12, described on  corresponding MRI of the thoracic spine. Electronically Signed   By: Morene Hoard M.D.   On: 10/19/2023 20:18   MR THORACIC SPINE W WO CONTRAST Result Date: 10/19/2023 CLINICAL DATA:  Initial evaluation for thoracic osteomyelitis. EXAM: MRI THORACIC WITHOUT AND WITH CONTRAST TECHNIQUE: Multiplanar and multiecho pulse sequences of the thoracic spine were obtained without and with intravenous contrast. CONTRAST:  8mL GADAVIST GADOBUTROL 1 MMOL/ML IV SOLN COMPARISON:  None Available. FINDINGS: Alignment: Exaggeration of the normal thoracic kyphosis. No significant listhesis. Vertebrae: Findings consistent with acute osteomyelitis discitis at T11-12. Associated exuberant reactive marrow edema and enhancement. Endplate irregularity about the T11-12 interspace, with mild height loss about the T11 and T12 vertebral bodies. Mild paraspinous edema/inflammation but no loculated paraspinous collections. No significant epidural involvement at this time. No other evidence for distant infection elsewhere within the thoracic spine. Vertebral body height otherwise maintained. Bone marrow signal intensity within normal limits. No worrisome osseous lesions. Cord:  Normal signal and morphology. Paraspinal and other soft tissues: Mild paraspinous inflammation adjacent to the T11-12 interspace. Small right pleural effusion. Ovoid T1/T2 hyperintense lesion at the posterior right pleural space measures 3.8 x 2.6 cm (series 20, image 32), nonspecific, but possibly a loculated complex effusion. Few small T2 hyperintense left renal cyst noted, benign in appearance, no follow-up imaging recommended. Disc levels: T1-2:  Unremarkable. T2-3: Unremarkable. T3-4:  Small right paracentral disc protrusion.  No stenosis. T4-5:  Small central disc protrusion without stenosis. T5-6:  Small right paracentral disc protrusion without stenosis. T6-7:  Small central disc protrusion without significant stenosis. T7-8: Tiny left paracentral disc  protrusion without significant stenosis. T8-9: Central disc protrusion indents the ventral thecal sac. Minimal cord flattening without cord signal changes or significant spinal  stenosis. Mild right-sided facet hypertrophy. Foramina remain patent. T9-10: Mild disc bulge with facet hypertrophy.  No stenosis. T10-11: Negative interspace. Mild right greater left facet hypertrophy. No stenosis. T11-12: Findings consistent with osteomyelitis discitis. No significant epidural involvement. No spinal stenosis. Foramina remain grossly patent. T12-L1:  Negative interspace.  Mild facet hypertrophy.  No stenosis. IMPRESSION: 1. Findings consistent with acute osteomyelitis discitis at T11-12. No significant epidural involvement at this time. 2. Small right pleural effusion with 3.8 x 2.6 cm ovoid T1/T2 hyperintense lesion at the posterior right pleural space, nonspecific, but possibly a loculated complex effusion. Correlation with dedicated chest imaging suggested for further evaluation. 3. Underlying multilevel thoracic spondylosis as above without significant stenosis. Electronically Signed   By: Morene Hoard M.D.   On: 10/19/2023 19:58    Review of Systems  HENT:  Negative for ear discharge, ear pain, hearing loss and tinnitus.   Eyes:  Negative for photophobia and pain.  Respiratory:  Negative for cough and shortness of breath.   Cardiovascular:  Negative for chest pain.  Gastrointestinal:  Negative for abdominal pain, nausea and vomiting.  Genitourinary:  Negative for dysuria, flank pain, frequency and urgency.  Musculoskeletal:  Positive for arthralgias (Left shoulder) and back pain. Negative for myalgias and neck pain.  Neurological:  Negative for dizziness and headaches.  Hematological:  Does not bruise/bleed easily.  Psychiatric/Behavioral:  The patient is not nervous/anxious.    Blood pressure (!) 144/96, pulse 74, temperature 97.8 F (36.6 C), resp. rate 18, height 5' 11 (1.803 m), weight 81  kg, SpO2 100%. Physical Exam Constitutional:      General: He is not in acute distress.    Appearance: He is well-developed. He is not diaphoretic.  HENT:     Head: Normocephalic and atraumatic.  Eyes:     General: No scleral icterus.       Right eye: No discharge.        Left eye: No discharge.     Conjunctiva/sclera: Conjunctivae normal.  Cardiovascular:     Rate and Rhythm: Normal rate and regular rhythm.  Pulmonary:     Effort: Pulmonary effort is normal. No respiratory distress.  Musculoskeletal:     Cervical back: Normal range of motion.     Comments: Left shoulder, elbow, wrist, digits- no skin wounds, generalized TTP shoulder, esp posteriorly. Also localized clavicular TTP along entire course. AROM 30 abd, 45 ext, PROM about 15 degrees more in each plane, no instability, no blocks to motion  Sens  Ax/R/M/U intact  Mot   Ax/ R/ PIN/ M/ AIN/ U intact  Rad 2+  Skin:    General: Skin is warm and dry.  Neurological:     Mental Status: He is alert.  Psychiatric:        Mood and Affect: Mood normal.        Behavior: Behavior normal.     Assessment/Plan: Left shoulder pain -- Given history and exam favor the rotator cuff tear as the source of his symptoms though it's pretty mild. No evidence to suggest septic joint. Although AC joint is moderately degenerate do not feel this is contributing to his symptoms. Will give sling for comfort, needs standard RC directed PT, ROM/WB ad lib. Will try lidocaine  patch over post shoulder. F/u with Dr. Dozier in 2-3 weeks.    Ozell DOROTHA Ned, PA-C Orthopedic Surgery [phone] 10/21/2023, 3:02 PM

## 2023-10-22 ENCOUNTER — Other Ambulatory Visit: Payer: Self-pay

## 2023-10-22 DIAGNOSIS — M4644 Discitis, unspecified, thoracic region: Secondary | ICD-10-CM

## 2023-10-22 DIAGNOSIS — B9561 Methicillin susceptible Staphylococcus aureus infection as the cause of diseases classified elsewhere: Secondary | ICD-10-CM

## 2023-10-22 DIAGNOSIS — J869 Pyothorax without fistula: Secondary | ICD-10-CM

## 2023-10-22 DIAGNOSIS — M4624 Osteomyelitis of vertebra, thoracic region: Secondary | ICD-10-CM | POA: Diagnosis not present

## 2023-10-22 DIAGNOSIS — M25512 Pain in left shoulder: Secondary | ICD-10-CM

## 2023-10-22 LAB — CBC
HCT: 27.3 % — ABNORMAL LOW (ref 39.0–52.0)
Hemoglobin: 8.6 g/dL — ABNORMAL LOW (ref 13.0–17.0)
MCH: 28.5 pg (ref 26.0–34.0)
MCHC: 31.5 g/dL (ref 30.0–36.0)
MCV: 90.4 fL (ref 80.0–100.0)
Platelets: 311 K/uL (ref 150–400)
RBC: 3.02 MIL/uL — ABNORMAL LOW (ref 4.22–5.81)
RDW: 13.9 % (ref 11.5–15.5)
WBC: 7.5 K/uL (ref 4.0–10.5)
nRBC: 0 % (ref 0.0–0.2)

## 2023-10-22 LAB — BASIC METABOLIC PANEL WITH GFR
Anion gap: 9 (ref 5–15)
BUN: 11 mg/dL (ref 8–23)
CO2: 27 mmol/L (ref 22–32)
Calcium: 8.7 mg/dL — ABNORMAL LOW (ref 8.9–10.3)
Chloride: 100 mmol/L (ref 98–111)
Creatinine, Ser: 1.2 mg/dL (ref 0.61–1.24)
GFR, Estimated: >60 mL/min (ref >60)
Glucose, Bld: 115 mg/dL — ABNORMAL HIGH (ref 70–99)
Potassium: 3.8 mmol/L (ref 3.5–5.1)
Sodium: 136 mmol/L (ref 135–145)

## 2023-10-22 MED ORDER — CHLORHEXIDINE GLUCONATE CLOTH 2 % EX PADS
6.0000 | MEDICATED_PAD | Freq: Every day | CUTANEOUS | Status: DC
  Administered 2023-10-23 – 2023-10-26 (×4): 6 via TOPICAL

## 2023-10-22 MED ORDER — SODIUM CHLORIDE 0.9% FLUSH: 10.0000 mL | INTRAVENOUS | Status: DC | PRN

## 2023-10-22 NOTE — Evaluation (Signed)
 Occupational Therapy Evaluation Patient Details Name: Jacob [name] MRN: [id number] DOB: 07/17/1950 Today's Date: 10/22/2023   History of Present Illness   LOFTON [name] is a 73 y.o. male admitted 10/19/23 for acute osteomyelitis discitis of T11-T12. Seen on outpatient MRI 9/11, read 9/19; progression noted on repeat scan 9/30. Additionally, pt with significant multilevel thoracic disc degeneration with some small central disc bulges which do not cause severe cord compression. MRI left shoulder shows small partial-thickness tear of the posterior supraspinatus tendon, diffuse subclavius muscular edema and mild enhancement which may reflect strain, inflammation or denervation edema. Neurosurgery recommending aspiration by IR, IV antibiotics x6 weeks, and ID consult. PMHx: Crohn's disease, CAD s/p CABG, CVA, carotid artery stenosis c/p CEA, HTN, HLD, and  right MSSA empyema.     Clinical Impressions Pt seen with wife at bedside. Pt has baseline memory deficits. Recent fall happened as he forgot to use his walker. Family has been walking with him in the home. He receives assistance for ADLs as needed and sits to shower. His wife and son are responsible for med and home management and transportation. Pt presents with L shoulder pain he rates 8/10. Full use of L elbow to hand for bilateral tasks. Pt requires min assist to stand with inability to use L UE to push up. He ambulates with CGA and RW. He needs min to max assist for ADLs. Recommending HHOT when pt returns home.      If plan is discharge home, recommend the following:   A little help with walking and/or transfers;A lot of help with bathing/dressing/bathroom;Assistance with cooking/housework;Assistance with feeding;Direct supervision/assist for medications management;Direct supervision/assist for financial management;Assist for transportation;Help with stairs or ramp for entrance     Functional Status Assessment   Patient has had a  recent decline in their functional status and demonstrates the ability to make significant improvements in function in a reasonable and predictable amount of time.     Equipment Recommendations   None recommended by OT     Recommendations for Other Services         Precautions/Restrictions   Precautions Precautions: Fall Recall of Precautions/Restrictions: Impaired Required Braces or Orthoses: Sling (for comfort) Restrictions Weight Bearing Restrictions Per Provider Order: Yes     Mobility Bed Mobility Overal bed mobility: Needs Assistance Bed Mobility: Supine to Sit, Sit to Supine     Supine to sit: Contact guard, HOB elevated, Used rails Sit to supine: Contact guard assist   General bed mobility comments: educated in log roll technique for comfort, pt with poor sequencing    Transfers Overall transfer level: Needs assistance Equipment used: Rolling walker (2 wheels) Transfers: Sit to/from Stand, Bed to chair/wheelchair/BSC Sit to Stand: Min assist           General transfer comment: assist to rise and steady without ability to use L UE to push up      Balance Overall balance assessment: Needs assistance   Sitting balance-Leahy Scale: Fair     Standing balance support: Bilateral upper extremity supported, During functional activity, Reliant on assistive device for balance Standing balance-Leahy Scale: Poor Standing balance comment: Pt dependent on RW                           ADL either performed or assessed with clinical judgement   ADL Overall ADL's : Needs assistance/impaired Eating/Feeding: Minimal assistance;Sitting Eating/Feeding Details (indicate cue type and reason): to cut food Grooming: Wash/dry hands;Wash/dry  face;Sitting;Supervision/safety   Upper Body Bathing: Moderate assistance;Sitting   Lower Body Bathing: Maximal assistance;Sit to/from stand   Upper Body Dressing : Moderate assistance;Sitting Upper Body Dressing  Details (indicate cue type and reason): instructed to dress L arm first Lower Body Dressing: Maximal assistance;Sit to/from stand   Toilet Transfer: Contact guard assist;Ambulation;Rolling walker (2 wheels)   Toileting- Clothing Manipulation and Hygiene: Moderate assistance;Sit to/from stand       Functional mobility during ADLs: Contact guard assist;Rolling walker (2 wheels)       Vision Ability to See in Adequate Light: 0 Adequate Patient Visual Report: No change from baseline       Perception         Praxis         Pertinent Vitals/Pain Pain Assessment Pain Assessment: 0-10 Pain Score: 8  Pain Location: L shoulder Pain Descriptors / Indicators: Aching Pain Intervention(s): Patient requesting pain meds-RN notified     Extremity/Trunk Assessment Upper Extremity Assessment Upper Extremity Assessment: Overall WFL for tasks assessed;Right hand dominant;LUE deficits/detail LUE Deficits / Details: shoulder limited by pain, educated in positioning L UE in bed and sling LUE Coordination: decreased gross motor   Lower Extremity Assessment Lower Extremity Assessment: Defer to PT evaluation   Cervical / Trunk Assessment Cervical / Trunk Assessment: Other exceptions Cervical / Trunk Exceptions: acute osteomyelitis discitis T11-12   Communication Communication Communication: No apparent difficulties   Cognition Arousal: Alert Behavior During Therapy: Flat affect Cognition: History of cognitive impairments             OT - Cognition Comments: pt with baseline impairment in memory and safety per wife                 Following commands: Impaired Following commands impaired: Only follows one step commands consistently, Follows multi-step commands with increased time     Cueing  General Comments   Cueing Techniques: Verbal cues;Gestural cues      Exercises     Shoulder Instructions      Home Living Family/patient expects to be discharged to::  Private residence Living Arrangements: Spouse/significant other;Other relatives (wife, adult son and 47 year old grandson) Available Help at Discharge: Family;Available 24 hours/day Type of Home: House Home Access: Stairs to enter [employer] of Steps: 2 Entrance Stairs-Rails: Right Home Layout: One level     Bathroom Shower/Tub: Producer, television/film/video: Standard     Home Equipment: Hand held shower head;Shower seat - built in;Cane - single Librarian, academic (2 wheels);Rollator (4 wheels);BSC/3in1;Grab bars - toilet;Grab bars - tub/shower          Prior Functioning/Environment Prior Level of Function : Needs assist             Mobility Comments: walks with RW and supervision, fall occurred when he forgot to use his walker ADLs Comments: family assists, as needed, with bathing, dressing. Wife manages meds and finances. No longer drives.    OT Problem List: Decreased strength;Impaired balance (sitting and/or standing);Decreased cognition;Decreased safety awareness;Decreased knowledge of use of DME or AE;Impaired UE functional use;Pain   OT Treatment/Interventions: Self-care/ADL training;DME and/or AE instruction;Therapeutic activities;Patient/family education;Balance training      OT Goals(Current goals can be found in the care plan section)   Acute Rehab OT Goals OT Goal Formulation: With patient/family Time For Goal Achievement: 11/05/23 Potential to Achieve Goals: Good ADL Goals Pt Will Perform Grooming: with supervision;standing;with caregiver independent in assisting Pt Will Perform Upper Body Dressing: with min assist;with caregiver independent  in assisting;sitting Pt Will Perform Lower Body Dressing: with min assist;with caregiver independent in assisting;sit to/from stand Pt Will Transfer to Toilet: with supervision;ambulating;bedside commode Pt Will Perform Toileting - Clothing Manipulation and hygiene: sit to/from stand;with  supervision Additional ADL Goal #1: Pt and family will be knowledgeable in donning/doffing L UE sling and positioning L UE for comfort.   OT Frequency:  Min 2X/week    Co-evaluation              AM-PAC OT 6 Clicks Daily Activity     Outcome Measure Help from another person eating meals?: A Little Help from another person taking care of personal grooming?: A Little Help from another person toileting, which includes using toliet, bedpan, or urinal?: A Lot Help from another person bathing (including washing, rinsing, drying)?: A Lot Help from another person to put on and taking off regular upper body clothing?: A Lot Help from another person to put on and taking off regular lower body clothing?: A Lot 6 Click Score: 14   End of Session Equipment Utilized During Treatment: Gait belt;Rolling walker (2 wheels)  Activity Tolerance: Patient tolerated treatment well Patient left: in bed;with call bell/phone within reach;with family/visitor present;with bed alarm set  OT Visit Diagnosis: Unsteadiness on feet (R26.81);Other abnormalities of gait and mobility (R26.89);Pain;Muscle weakness (generalized) (M62.81);Other symptoms and signs involving cognitive function Pain - Right/Left: Left Pain - part of body: Shoulder                Time: 1049-1110 OT Time Calculation (min): 21 min Charges:  OT General Charges $OT Visit: 1 Visit OT Evaluation $OT Eval Moderate Complexity: 1 Mod  Mliss HERO, OTR/L Acute Rehabilitation Services Office: [phone]   Kennth Mliss Helling 10/22/2023, 11:18 AM

## 2023-10-22 NOTE — Plan of Care (Signed)
   Problem: Education: Goal: Knowledge of General Education information will improve Description: Including pain rating scale, medication(s)/side effects and non-pharmacologic comfort measures Outcome: Progressing   Problem: Pain Managment: Goal: General experience of comfort will improve and/or be controlled Outcome: Progressing   Problem: Safety: Goal: Ability to remain free from injury will improve Outcome: Progressing

## 2023-10-22 NOTE — Progress Notes (Signed)
 Patient approved by Dr. Philip for T11-12 disc aspiration. However, patient was not NPO at time of request.   Discussed case with on call IR MD for weekend (Dr. Jenna) who approves case to be completed tentatively tomorrow.

## 2023-10-22 NOTE — Plan of Care (Signed)
  Problem: Education: Goal: Knowledge of General Education information will improve Description: Including pain rating scale, medication(s)/side effects and non-pharmacologic comfort measures Outcome: Progressing   Problem: Health Behavior/Discharge Planning: Goal: Ability to manage health-related needs will improve Outcome: Progressing   Problem: Pain Managment: Goal: General experience of comfort will improve and/or be controlled Outcome: Progressing

## 2023-10-22 NOTE — Care Management Important Message (Signed)
 Important Message  Patient Details  Name: Jacob [name] MRN: [id number] Date of Birth: [birth year]   Important Message Given:  Yes - Medicare IM     Jon Cruel 10/22/2023, 3:30 PM

## 2023-10-22 NOTE — Progress Notes (Signed)
 [facility]  Date of Admission:  10/19/2023      Total days of antibiotics 3   Cefazolin            ASSESSMENT: Jacob [name] is a 73 y.o. male admitted with     New T11-12 Discitis w/o Abscess -  Most certainly this would be related to recent MSSA infection. Blood cultures drawn on admit. No systemic features of illness however. Discussed these findings and warrant prolonged IV antibiotics. We discussed Dr. Sisto recommendations for IR biopsy to the area and agree this may provide useful information if there is a new pathogen here.  Repeat TTE clear.  Crp 14.5, ESR 95 baseline  Continue cefazolin  for now.  Place PICC line    Left Shoulder Pain -  Acute in the setting of fall at home. Small partial thickness tear of posterior supraspinatus tendun, mild AC joint arthropathy, bursitis, trace edema of supraspinatus muscle indicative of strain. No effusion.    Empyema, MSSA -  Small pleural effusion. Had FU with Dr. Darlean with Pulm yesterday    Dr. Epifanio is on call for ID related concerns this weekend otherwise will see him back Monday.   PLAN: Continue IV cefazolin   Place PICC line  Agree with IR biopsy to send for routine aerobic/anaerobic cultures    Principal Problem:   Acute osteomyelitis discitis of thoracic spine T11-T12 (HCC) Active Problems:   Coronary artery disease due to lipid rich plaque   Essential hypertension, benign   Empyema of right pleural space (HCC)   Paroxysmal atrial fibrillation (HCC)    acetaminophen   1,000 mg Oral Q8H   Or   acetaminophen   650 mg Rectal Q8H   amiodarone   200 mg Oral Daily   aspirin  EC  81 mg Oral Daily   atorvastatin   80 mg Oral Daily   heparin   5,000 Units Subcutaneous Q8H   lidocaine   1 patch Transdermal Q24H   metoprolol  succinate  25 mg Oral Daily   sodium chloride  flush  3 mL Intravenous Q12H   tamsulosin   0.4 mg Oral QHS    SUBJECTIVE: Shoulder pain is pre=occupying  his condition at present. In a sling for now  Review of Systems: Review of Systems  Constitutional:  Negative for chills and fever.  Gastrointestinal:  Negative for abdominal pain, nausea and vomiting.  Musculoskeletal:  Positive for back pain and joint pain.    No Known Allergies  OBJECTIVE: Vitals:   10/21/23 1714 10/21/23 2038 10/22/23 0504 10/22/23 0814  BP: 130/80 111/77 105/66 (!) 96/58  Pulse: (!) 59 (!) 59 (!) 55 (!) 49  Resp: 18 18 18 16   Temp: 98.2 F (36.8 C) 98.1 F (36.7 C) 97.9 F (36.6 C) 98.3 F (36.8 C)  TempSrc: Oral Oral Oral Oral  SpO2: 96% 94% 98% 93%  Weight:      Height:       Body mass index is 24.91 kg/m.  Physical Exam Constitutional:      Appearance: He is not ill-appearing.  Cardiovascular:     Rate and Rhythm: Normal rate and regular rhythm.     Pulses: Normal pulses.     Heart sounds: Normal heart sounds.  Pulmonary:     Effort: Pulmonary effort is normal.     Breath sounds: Normal breath sounds.  Abdominal:     General: Bowel sounds are normal. There is no distension.  Musculoskeletal:     Comments: L  shoulder in a sling  Skin:    General: Skin is warm and dry.     Capillary Refill: Capillary refill takes 2 to 3 seconds.  Neurological:     Mental Status: He is alert and oriented to person, place, and time.     Lab Results Lab Results  Component Value Date   WBC 7.5 10/22/2023   HGB 8.6 (L) 10/22/2023   HCT 27.3 (L) 10/22/2023   MCV 90.4 10/22/2023   PLT 311 10/22/2023    Lab Results  Component Value Date   CREATININE 1.20 10/22/2023   BUN 11 10/22/2023   NA 136 10/22/2023   K 3.8 10/22/2023   CL 100 10/22/2023   CO2 27 10/22/2023    Lab Results  Component Value Date   ALT 18 09/09/2023   AST 41 09/09/2023   ALKPHOS 70 09/09/2023   BILITOT 0.4 09/09/2023     Microbiology: Recent Results (from the past 240 hours)  Culture, blood (Routine X 2) w Reflex to ID Panel     Status: None (Preliminary result)    Collection Time: 10/19/23  9:40 PM   Specimen: BLOOD LEFT WRIST  Result Value Ref Range Status   Specimen Description BLOOD LEFT WRIST  Final   Special Requests   Final    BOTTLES DRAWN AEROBIC AND ANAEROBIC Blood Culture adequate volume   Culture   Final    NO GROWTH 3 DAYS Performed at [facility], [address], [place], [place] [address]    Report Status PENDING  Incomplete  Culture, blood (Routine X 2) w Reflex to ID Panel     Status: None (Preliminary result)   Collection Time: 10/19/23  9:50 PM   Specimen: BLOOD RIGHT FOREARM  Result Value Ref Range Status   Specimen Description BLOOD RIGHT FOREARM  Final   Special Requests   Final    BOTTLES DRAWN AEROBIC AND ANAEROBIC Blood Culture results may not be optimal due to an inadequate volume of blood received in culture bottles   Culture   Final    NO GROWTH 3 DAYS Performed at [facility], [address] [place], [place] [address]    Report Status PENDING  Incomplete     Corean Fireman, MSN, NP-C [facility]  [email or web address] Pager: [phone] Office: [phone] [facility] Main Line: [phone] *Secure Chat Communication Welcome  Total Encounter Time: 15 m

## 2023-10-22 NOTE — Progress Notes (Signed)
 Peripherally Inserted Central Catheter Placement  The IV Nurse has discussed with the patient and/or persons authorized to consent for the patient, the purpose of this procedure and the potential benefits and risks involved with this procedure.  The benefits include less needle sticks, lab draws from the catheter, and the patient may be discharged home with the catheter. Risks include, but not limited to, infection, bleeding, blood clot (thrombus formation), and puncture of an artery; nerve damage and irregular heartbeat and possibility to perform a PICC exchange if needed/ordered by physician.  Alternatives to this procedure were also discussed.  Bard Power PICC patient education guide, fact sheet on infection prevention and patient information card has been provided to patient /or left at bedside.    PICC Placement Documentation  PICC Single Lumen 10/22/23 Right Brachial 39 cm 0 cm (Active)  Indication for Insertion or Continuance of Line Home intravenous therapies (PICC only) 10/22/23 2152  Exposed Catheter (cm) 0 cm 10/22/23 2152  Site Assessment Clean, Dry, Intact 10/22/23 2152  Line Status Blood return noted;Flushed;Saline locked 10/22/23 2152  Dressing Type Transparent;Securing device 10/22/23 2152  Dressing Status Antimicrobial disc/dressing in place 10/22/23 2152  Line Care Connections checked and tightened 10/22/23 2152  Line Adjustment (NICU/IV Team Only) No 10/22/23 2152  Dressing Intervention New dressing;Adhesive placed at insertion site (IV team only) 10/22/23 2152  Dressing Change Due 10/29/23 10/22/23 2152       Debby Salomon CROME 10/22/2023, 10:02 PM

## 2023-10-22 NOTE — Evaluation (Signed)
 Physical Therapy Evaluation Patient Details Name: Jacob [name] MRN: [id number] DOB: [birth year] Today's Date: 10/22/2023  History of Present Illness  Jacob [name] is a 73 y.o. male admitted 10/19/23 for acute osteomyelitis discitis of T11-T12. Seen on outpatient MRI 9/11, read 9/19; progression noted on repeat scan 9/30. Additionally, pt with significant multilevel thoracic disc degeneration with some small central disc bulges which do not cause severe cord compression. MRI left shoulder shows small partial-thickness tear of the posterior supraspinatus tendon, diffuse subclavius muscular edema and mild enhancement which may reflect strain, inflammation or denervation edema. Neurosurgery recommending aspiration by IR, IV antibiotics x6 weeks, and ID consult. PMHx: Crohn's disease, CAD s/p CABG, CVA, carotid artery stenosis c/p CEA, HTN, HLD, and  right MSSA empyema.   Clinical Impression  Pt admitted with above diagnosis. PTA, pt was modI for functional mobility using RW, modI with most ADLs, and required assist with LB dressing and IADLs. He lives with his wife, son, and grandson in a one story house with 2 STE. Pt currently with functional limitations due to the deficits listed below (see PT Problem List). He required CGA for bed mobility, minA for transfer using RW, and CGA for gait using RW. Educated pt on back precautions as a conservative measure. He recalled 0/3 at end of session and declined to perform log roll technique. Pt demonstrated a slow step-to gait pattern while ambulating in the hallway. He required assist to safely maneuver AD while turning and cues to maintain body inside the AD for improved stability/support. Pt is currently limited by decreased safety awareness, pain, and reduced activity tolerance. Pt will benefit from acute skilled PT to increase his independence and safety with mobility to allow discharge. Recommend HHPT to increase cardiopulmonary endurance, improve balance,  decrease fall risk, and optimize safety within the home environment.      If plan is discharge home, recommend the following: A little help with walking and/or transfers;A little help with bathing/dressing/bathroom;Assistance with cooking/housework;Assist for transportation;Help with stairs or ramp for entrance   Can travel by private vehicle        Equipment Recommendations None recommended by PT (Pt already has DME)  Recommendations for Other Services       Functional Status Assessment Patient has had a recent decline in their functional status and demonstrates the ability to make significant improvements in function in a reasonable and predictable amount of time.     Precautions / Restrictions Precautions Precautions: Fall Recall of Precautions/Restrictions: Impaired Precaution/Restrictions Comments: No formal orders for back precautions, followed for as a conservative measure. Educated pt on precautions and log roll technique. Cues to follow them throughout mobility with minimal to no carryover. Required Braces or Orthoses: Sling (LUE; donned for comfort at end of session.) Restrictions Weight Bearing Restrictions Per Provider Order: Yes LUE Weight Bearing Per Provider Order: Weight bearing as tolerated      Mobility  Bed Mobility Overal bed mobility: Needs Assistance Bed Mobility: Supine to Sit     Supine to sit: Contact guard, HOB elevated, Used rails     General bed mobility comments: Educated pt on log roll technique to follow back precautions. Advised pt to get up on R side of bed d/t L shoulder pain. He declined and opted to pull himself into long sitting using RUE on bed rail and slowly bring BLE off EOB. Assist to elevate trunk and scoot hips fwd with use of bed pad.    Transfers Overall transfer level: Needs assistance Equipment  used: Rolling walker (2 wheels) Transfers: Sit to/from Stand, Bed to chair/wheelchair/BSC Sit to Stand: Min assist   Step pivot  transfers: Min assist       General transfer comment: Pt stood from lowest bed height. Cued proper hand placement. Pt pushed up with RUE and had LUE on RW. Powered up with minA. Pt with posterior bias initially, increased time to achieve upright posture and regain stability. Transferred to recliner chair within room. Good eccentric control. Instructed pt to reach back for arm rest, he opted to use LUE instead of RUE despite pain. Assist to bring RW all the way back to recliner chair to maintain pt inside AD.    Ambulation/Gait Ambulation/Gait assistance: Contact guard assist Gait Distance (Feet): 150 Feet Assistive device: Rolling walker (2 wheels) Gait Pattern/deviations: Step-to pattern, Decreased step length - right, Decreased step length - left, Decreased stride length, Narrow base of support, Trunk flexed Gait velocity: decreased Gait velocity interpretation: <1.31 ft/sec, indicative of household ambulator   General Gait Details: Pt ambulated with short slow steps. He maintained a NBOS with limited, but adequate foot clearence. Pt had a slight fwd lean and kept RW too far infront of him. Cues for upright posture, proximity to RW, increased BOS, and increase step length with minimal to no adjustment noted. Pt slightly unsteady, no overt LOB.  Stairs            Wheelchair Mobility     Tilt Bed    Modified Rankin (Stroke Patients Only)       Balance Overall balance assessment: Needs assistance Sitting-balance support: Bilateral upper extremity supported, Feet supported Sitting balance-Leahy Scale: Fair     Standing balance support: Bilateral upper extremity supported, During functional activity, Reliant on assistive device for balance Standing balance-Leahy Scale: Poor Standing balance comment: Pt dependent on RW                             Pertinent Vitals/Pain Pain Assessment Pain Assessment: 0-10 Pain Score: 9  Pain Location: L Shoulder and Back Pain  Descriptors / Indicators: Discomfort, Aching, Sore, Tender Pain Intervention(s): Monitored during session, Limited activity within patient's tolerance, Repositioned    Home Living Family/patient expects to be discharged to:: Private residence Living Arrangements: Spouse/significant other;Other relatives (Wife, Son (10 y.o.), and Grandson (86 y.o.)) Available Help at Discharge: Family;Available 24 hours/day Type of Home: House Home Access: Stairs to enter Entrance Stairs-Rails: Right Entrance Stairs-Number of Steps: 2   Home Layout: One level Home Equipment: Hand held shower head;Shower seat - built in;Cane - single Librarian, academic (2 wheels);Rollator (4 wheels);BSC/3in1 Additional Comments: Pt reports he is currently recieving HH therapy 2-3x/week.    Prior Function Prior Level of Function : Independent/Modified Independent;Needs assist       Physical Assist : ADLs (physical)   ADLs (physical): Dressing;IADLs Mobility Comments: Ambulates using RW. 1 fall from LOB. ADLs Comments: ModI with most ADLs. Reports he has been sponge bathing at home. Requires assist with LB dressing. Indep with pericare. Wife manages medications. Doesn't drive since Sept, relies on family for transportation.     Extremity/Trunk Assessment   Upper Extremity Assessment Upper Extremity Assessment: Defer to OT evaluation    Lower Extremity Assessment Lower Extremity Assessment: RLE deficits/detail;LLE deficits/detail RLE Deficits / Details: AROM WFL. Decreased strength, grossly 3+/5. RLE Sensation: history of peripheral neuropathy RLE Coordination: decreased gross motor LLE Deficits / Details: AROM WFL. Decreased strength, grossly 3+/5. LLE Sensation: history  of peripheral neuropathy LLE Coordination: decreased gross motor    Cervical / Trunk Assessment Cervical / Trunk Assessment: Other exceptions Cervical / Trunk Exceptions: Significant multilevel thoracic disc degeneration with some small  central disc bulges, which do not cause severe cord compression & acute osteomyelitis discitis at T11-12.  Communication   Communication Communication: No apparent difficulties    Cognition Arousal: Alert Behavior During Therapy: Flat affect   PT - Cognitive impairments: No family/caregiver present to determine baseline, Sequencing, Safety/Judgement                       PT - Cognition Comments: Pt A,Ox4. Moderate cues for increased safety awareness throughout session. Educated pt on back precautions and recommended he rolled to his R given the left shoulder pain, pt declined and opted to get up on his L side but c/o difficulty using LUE. Decreased insight into current condition. Following commands: Impaired Following commands impaired: Only follows one step commands consistently, Follows multi-step commands with increased time     Cueing Cueing Techniques: Verbal cues, Gestural cues     General Comments      Exercises     Assessment/Plan    PT Assessment Patient needs continued PT services  PT Problem List Decreased activity tolerance;Decreased balance;Decreased mobility;Decreased knowledge of use of DME;Decreased safety awareness;Decreased knowledge of precautions;Pain       PT Treatment Interventions DME instruction;Gait training;Stair training;Functional mobility training;Therapeutic activities;Therapeutic exercise;Balance training;Patient/family education    PT Goals (Current goals can be found in the Care Plan section)  Acute Rehab PT Goals Patient Stated Goal: Return Home PT Goal Formulation: With patient Time For Goal Achievement: 11/05/23 Potential to Achieve Goals: Good    Frequency Min 2X/week     Co-evaluation               AM-PAC PT 6 Clicks Mobility  Outcome Measure Help needed turning from your back to your side while in a flat bed without using bedrails?: A Little Help needed moving from lying on your back to sitting on the side of a  flat bed without using bedrails?: A Little Help needed moving to and from a bed to a chair (including a wheelchair)?: A Little Help needed standing up from a chair using your arms (e.g., wheelchair or bedside chair)?: A Little Help needed to walk in hospital room?: A Little Help needed climbing 3-5 steps with a railing? : A Lot 6 Click Score: 17    End of Session Equipment Utilized During Treatment: Gait belt Activity Tolerance: Patient tolerated treatment well Patient left: in chair;with call bell/phone within reach;with chair alarm set Nurse Communication: Mobility status;Other (comment) (lack of gray cord to attach to green chair alarm box to call out to nursing station) PT Visit Diagnosis: Difficulty in walking, not elsewhere classified (R26.2);Unsteadiness on feet (R26.81);Pain Pain - Right/Left: Left Pain - part of body: Shoulder    Time: 9263-9196 PT Time Calculation (min) (ACUTE ONLY): 27 min   Charges:   PT Evaluation $PT Eval Moderate Complexity: 1 Mod PT Treatments $Gait Training: 8-22 mins PT General Charges $$ ACUTE PT VISIT: 1 Visit         Randall SAUNDERS, PT, DPT Acute Rehabilitation Services Office: [phone] Secure Chat Preferred  Delon CHRISTELLA Callander 10/22/2023, 9:00 AM

## 2023-10-22 NOTE — Progress Notes (Signed)
 Progress Note   Patient: Jacob [name] FMW:[id number] DOB: 08-03-1950 DOA: 10/19/2023     3 DOS: the patient was seen and examined on 10/22/2023   Brief hospital course: Jacob [name] is a 73 y.o. male with medical history significant for CAD s/p CABG, CAS s/p right CEA, PAF not on AC, HTN, Crohn's disease, right MSSA empyema who is admitted with acute osteomyelitis discitis of T11-T12.  This was seen on outpatient MRI 9/11, read on 9/19 and referred to ED from Pulmonology follow up appointment due to worsening back and left shoulder pain in the setting of abnormal MRI findings.  Of note, pt had recent MSSA empyema during admission in August. See H&P for full HPI on admission & ED course.   Assessment and Plan:  Acute osteomyelitis discitis at T11-12: Presumed due to MSSA given recent empyema.   --Consult ID, appreciate recommendations --Neurosurgery was consulted in the ED, recs pending. Seen by Dr. Louis - agrees with IV antibiotics, rec IR to aspirate epidural abscess for culture, no indication for surgical intervention --Continue IV Ancef   --Follow blood cultures --2D echo negative for vegetations, may need TEE --IR consult for aspiration & culture of epidural abscess    History of right MSSA empyema: admitted in August.  Has since completed course of oral antibiotics with Augmentin  as ID recommended that admission.  Imaging shows improvement in pleural effusion, small residual fluid remains. Seen  in Pulmonology clinic follow up 9/30 --EDP discussed case with Dr. Claudene.  Empyema felt to be improving.   Left shoulder pain: Patient reports pain limiting range of motion of left shoulder.  Pain seems to primarily be at the [facility] joint area.  He denies any injury.  Concern is for possible septic arthritis.   MRI left shoulder shows small partial-thickness tear of the posterior supraspinatus tendon, diffuse subclavius muscular edema and mild enhancement which may reflect strain,  inflammation or denervation edema... --Orthopedics consulted --pain control PRN --PT/OT   Paroxysmal atrial fibrillation: NSR on admission. --Pt has been off anticoagulation due to recent acute blood loss anemia from hemothorax.  --Continue amiodarone  and Toprol -XL --Continue on aspirin  81 mg daily for now.   Recent ABLA due to hemothorax while on heparin  anticoagulation: Hemoglobin is stable at 9.6.   --Monitor CBC --Monitor for bleeding   CAD s/p CABG: --Continue aspirin , Toprol -XL, atorvastatin .   Hypertension: BP's controlled, soft this AM --Continue Toprol -XL  --Holding lisinopril  and amlodipine  - resume when BP indicates   Crohn's disease: Quiescent for many years, not on disease-specific therapy.      Subjective: Pt seen sitting up in bed, family at bedside.  He reports shoulder pain ongoing, has been placed in a sling.  He denies other acute complaints, but wanting to go home asap.   Physical Exam: Vitals:   10/22/23 0504 10/22/23 0814 10/22/23 1059 10/22/23 1200  BP: 105/66 (!) 96/58 102/62   Pulse: (!) 55 (!) 49 62   Resp: 18 16 18 18   Temp: 97.9 F (36.6 C) 98.3 F (36.8 C)    TempSrc: Oral Oral    SpO2: 98% 93% 95%   Weight:      Height:       General exam: awake, alert, no acute distress HEENT: moist mucus membranes, hearing grossly normal  Respiratory system: on room air, normal respiratory effort. Cardiovascular system: normal S1/S2, RRR, no pedal edema.   Gastrointestinal system: soft, NT, ND Central nervous system: A&O x 3. no gross focal neurologic deficits, normal speech  Extremities: moves all, no edema, normal tone Skin: dry, intact, normal temperature Psychiatry: normal mood, congruent affect, judgement and insight appear normal  Data Reviewed:  Notable labs -- glucose 115, Ca 8.7, Hbg 11.0 >> 9.6 >> 8.9 >> 8.8 >> 8.6   10/2 Echo EF 50-55%, G1DD, no evidence of valvular vegetations seen.   Family Communication: at bedside on  rounds  Disposition: Status is: Inpatient Remains inpatient appropriate because: remains on IV antibiotics, ongoing evaluation   Planned Discharge Destination: Home with Home Health    Time spent: 40 minutes  Author: Burnard DELENA Cunning, DO 10/22/2023 2:34 PM  For on call review [email or web address].

## 2023-10-23 ENCOUNTER — Inpatient Hospital Stay (HOSPITAL_COMMUNITY)

## 2023-10-23 DIAGNOSIS — M4624 Osteomyelitis of vertebra, thoracic region: Secondary | ICD-10-CM | POA: Diagnosis not present

## 2023-10-23 LAB — CBC
HCT: 24.9 % — ABNORMAL LOW (ref 39.0–52.0)
Hemoglobin: 7.8 g/dL — ABNORMAL LOW (ref 13.0–17.0)
MCH: 28.8 pg (ref 26.0–34.0)
MCHC: 31.3 g/dL (ref 30.0–36.0)
MCV: 91.9 fL (ref 80.0–100.0)
Platelets: 299 K/uL (ref 150–400)
RBC: 2.71 MIL/uL — ABNORMAL LOW (ref 4.22–5.81)
RDW: 13.9 % (ref 11.5–15.5)
WBC: 7.5 K/uL (ref 4.0–10.5)
nRBC: 0 % (ref 0.0–0.2)

## 2023-10-23 LAB — RETICULOCYTES
Immature Retic Fract: 23.6 % — ABNORMAL HIGH (ref 2.3–15.9)
RBC.: 2.66 MIL/uL — ABNORMAL LOW (ref 4.22–5.81)
Retic Count, Absolute: 29.8 K/uL (ref 19.0–186.0)
Retic Ct Pct: 1.1 % (ref 0.4–3.1)

## 2023-10-23 LAB — FOLATE: Folate: 5.1 ng/mL — ABNORMAL LOW (ref >5.9)

## 2023-10-23 LAB — PROTIME-INR
INR: 1.1 (ref 0.8–1.2)
Prothrombin Time: 15.2 s (ref 11.4–15.2)

## 2023-10-23 LAB — VITAMIN B12: Vitamin B-12: 222 pg/mL (ref 180–914)

## 2023-10-23 MED ORDER — FENTANYL CITRATE (PF) 100 MCG/2ML IJ SOLN
INTRAMUSCULAR | Status: AC | PRN
  Administered 2023-10-23 (×2): 50 ug via INTRAVENOUS

## 2023-10-23 MED ORDER — MIDAZOLAM HCL 2 MG/2ML IJ SOLN
INTRAMUSCULAR | Status: AC
  Filled 2023-10-23: qty 4

## 2023-10-23 MED ORDER — FOLIC ACID 1 MG PO TABS
1.0000 mg | ORAL_TABLET | Freq: Every day | ORAL | Status: DC
  Administered 2023-10-23 – 2023-10-26 (×4): 1 mg via ORAL
  Filled 2023-10-23 (×4): qty 1

## 2023-10-23 MED ORDER — HEPARIN SODIUM (PORCINE) 5000 UNIT/ML IJ SOLN
5000.0000 [IU] | Freq: Three times a day (TID) | INTRAMUSCULAR | Status: DC
  Administered 2023-10-23 – 2023-10-26 (×8): 5000 [IU] via SUBCUTANEOUS
  Filled 2023-10-23 (×8): qty 1

## 2023-10-23 MED ORDER — MIDAZOLAM HCL 2 MG/2ML IJ SOLN
INTRAMUSCULAR | Status: AC | PRN
  Administered 2023-10-23 (×2): 1 mg via INTRAVENOUS

## 2023-10-23 MED ORDER — FENTANYL CITRATE (PF) 100 MCG/2ML IJ SOLN
INTRAMUSCULAR | Status: AC
  Filled 2023-10-23: qty 4

## 2023-10-23 MED ORDER — LIDOCAINE 1 % OPTIME INJ - NO CHARGE
10.0000 mL | Freq: Once | INTRAMUSCULAR | Status: DC
  Filled 2023-10-23: qty 10

## 2023-10-23 NOTE — Plan of Care (Signed)
°  Problem: Education: Goal: Knowledge of General Education information will improve Description: Including pain rating scale, medication(s)/side effects and non-pharmacologic comfort measures Outcome: Progressing   Problem: Clinical Measurements: Goal: Ability to maintain clinical measurements within normal limits will improve Outcome: Progressing Goal: Will remain free from infection Outcome: Progressing Goal: Diagnostic test results will improve Outcome: Progressing   Problem: Nutrition: Goal: Adequate nutrition will be maintained Outcome: Progressing

## 2023-10-23 NOTE — Procedures (Signed)
 Interventional Radiology Procedure Note  Procedure: CT Guided Biopsy of T11-T12 disc aspiration  Complications: None  Estimated Blood Loss: < 10 mL  Findings: Disc aspiration of T11-12.  Cordella DELENA Banner, MD

## 2023-10-23 NOTE — Consult Note (Signed)
 Chief Complaint: T11-12 discitis  Referring Provider(s): Dr. Burnard Cunning  Supervising Physician: Jenna Hacker  Patient Status: [facility] - In-pt  History of Present Illness: Jacob [name] is a 73 y.o. male who was recently treated for a MSSA empyema and now presents with back pain and was found to have new T11-T12 discitis. Neurosurgery recommends aspiration for culture to guide treatment. IR consulted and case approved for T11-12 disc aspiration.   Confirms NPO since MN.  Does not wear CPAP or use supplemental home O2.  Denies fever, chills, SOB, CP, sore throat, N/V, abd pain, blood in stool or urine, abnormal bruising at this time. He endorses shoulder pain and back pain, had been experiencing night sweats at home intermittently.    Allergies Reviewed:  Patient has no known allergies.   Patient is Full Code  Past Medical History:  Diagnosis Date   CAD (coronary artery disease), native coronary artery     multivessel ASCAD s/p CABG and s/p PCI of SVG to RI 2008   Carotid artery stenosis    patent right CEA site and 1-39% Lcarotid stenosis   Crohn's disease (HCC)    flare 2000   Depression    Dyslipidemia    Dyspnea    ED (erectile dysfunction)    History of blood transfusion 2005   Hypertension    Myocardial infarction [facility]) 2005   5 bypasses   Nephrolithiasis    Stroke [facility])    TIA- Endartarectemy R side   TIA (transient ischemic attack)    Ulcerative colitis (HCC)    Left sided    Past Surgical History:  Procedure Laterality Date   CARDIAC CATHETERIZATION  06/08   Patnet grafts LAD, marginal, distal right   carpel tunnel surgery Left    COLONOSCOPY     COLONOSCOPY WITH PROPOFOL  N/A 10/25/2012   Procedure: COLONOSCOPY WITH PROPOFOL ;  Surgeon: Gladis MARLA Louder, MD;  Location: [facility] ENDOSCOPY;  Service: Endoscopy;  Laterality: N/A;   CORONARY ARTERY BYPASS GRAFT  2005   ASCVD,multivessel,S/P CABG  x5 total per patient   ENDARTERECTOMY Right 06/24/2016    Procedure: ENDARTERECTOMY CAROTID;  Surgeon: Marea Selinda RAMAN, MD;  Location: [facility] ORS;  Service: Vascular;  Laterality: Right;   HERNIA REPAIR  many years ago   IR THORACENTESIS ASP PLEURAL SPACE W/IMG GUIDE  09/02/2023   KNEE ARTHROSCOPY Right 1995      Medications: Prior to Admission medications   Medication Sig Start Date End Date Taking? Authorizing Provider  amiodarone  (PACERONE ) 200 MG tablet Take 1 tablet (200 mg total) by mouth daily. Patient taking differently: Take 100 mg by mouth daily. 10/11/23  Yes Terra Fairy PARAS, PA-C  aspirin  EC 81 MG tablet Take 81 mg by mouth daily. Swallow whole.   Yes [provider]  atorvastatin  (LIPITOR) 80 MG tablet Take 1 tablet (80 mg total) by mouth daily. 05/24/23  Yes Turner, Wilbert SAUNDERS, MD  glucosamine-chondroitin 500-400 MG tablet Take 1 tablet by mouth 2 (two) times daily.   Yes [provider]  HYDROcodone -acetaminophen  (NORCO/VICODIN) 5-325 MG tablet Take 1 tablet by mouth every 8 (eight) hours as needed for severe pain (pain score 7-10). 08/28/23  Yes Charlene Debby BROCKS, PA-C  latanoprost  (XALATAN ) 0.005 % ophthalmic solution Place 1 drop into both eyes at bedtime. 07/19/23  Yes [provider]  lidocaine  (SALONPAS PAIN RELIEVING) 4 % Place 1 patch onto the skin daily as needed (for shoulder and hip pain).   Yes [provider]  oxyCODONE -acetaminophen  (PERCOCET/ROXICET) 5-325 MG tablet Take by mouth 4 (four) times daily as needed. Given 1/2 tablet twice a day 09/28/23  Yes [provider]  sildenafil (VIAGRA) 100 MG tablet Take 100 mg by mouth daily as needed for erectile dysfunction. 12/13/21  Yes [provider]  tamsulosin  (FLOMAX ) 0.4 MG CAPS capsule Take 0.4 mg by mouth at bedtime. 07/04/21  Yes [provider]  tiZANidine  (ZANAFLEX ) 4 MG tablet Take 1 tablet (4 mg total) by mouth 3 (three) times daily. 08/28/23  Yes Charlene Debby BROCKS, PA-C  vitamin B-12 (CYANOCOBALAMIN ) 500 MCG tablet Take  1,000 mcg by mouth daily.   Yes [provider]  amLODipine  (NORVASC ) 5 MG tablet Take 1 tablet (5 mg total) by mouth daily. Patient not taking: Reported on 10/20/2023 11/05/22   Lucien Orren SAILOR, PA-C  lisinopril  (ZESTRIL ) 20 MG tablet Take 1 tablet (20 mg total) by mouth daily. Patient not taking: Reported on 10/20/2023 03/24/23   Lucien Orren SAILOR, PA-C  metoprolol  succinate (TOPROL -XL) 25 MG 24 hr tablet     [provider]     Family History  Problem Relation Age of Onset   Ovarian cancer Mother    CAD Father    CAD Sister    CAD Brother    CAD Brother    CAD Brother    Colon cancer Neg Hx    Esophageal cancer Neg Hx    Stomach cancer Neg Hx    Rectal cancer Neg Hx    Colon polyps Neg Hx     Social History   Socioeconomic History   Marital status: Married    Spouse name: Not on file   Number of children: 5   Years of education: Not on file   Highest education level: Not on file  Occupational History   Occupation: Retired   Tobacco Use   Smoking status: Never   Smokeless tobacco: Former    Types: Snuff  Vaping Use   Vaping status: Never Used  Substance and Sexual Activity   Alcohol use: No   Drug use: No   Sexual activity: Not on file  Other Topics Concern   Not on file  Social History Narrative   Are you right handed or left handed? Right Handed   Are you currently employed ?    What is your current occupation?   Do you live at home alone? No    Who lives with you? Wife, grandson, and adopted son.    What type of home do you live in: 1 story or 2 story? Lives in a one story home       Social Drivers of Health   Financial Resource Strain: Not on file  Food Insecurity: Food Insecurity Present (10/20/2023)   Hunger Vital Sign    Worried About Running Out of Food in the Last Year: Often true    Ran Out of Food in the Last Year: Often true  Transportation Needs: No Transportation Needs (10/20/2023)   PRAPARE - Scientist, research (physical sciences) (Medical): No    Lack of Transportation (Non-Medical): No  Physical Activity: Not on file  Stress: Not on file  Social Connections: Socially Integrated (10/20/2023)   Social Connection and Isolation Panel    Frequency of Communication with Friends and Family: More than three times a week    Frequency of Social Gatherings with Friends and Family: More than three times a week    Attends Religious Services: More than 4  times per year    Active Member of Clubs or Organizations: Yes    Attends Banker Meetings: More than 4 times per year    Marital Status: Married     Review of Systems: A 12 point ROS discussed and pertinent positives are indicated in the HPI above.  All other systems are negative.    Vital Signs: BP (!) 148/69 (BP Location: Right Leg)   Pulse (!) 57   Temp 98.5 F (36.9 C) (Oral)   Resp 16   Ht 5' 11 (1.803 m)   Wt 178 lb 9.2 oz (81 kg)   SpO2 93%   BMI 24.91 kg/m     Physical Exam HENT:     Mouth/Throat:     Mouth: Mucous membranes are moist.     Pharynx: Oropharynx is clear.  Cardiovascular:     Rate and Rhythm: Normal rate and regular rhythm.     Pulses: Normal pulses.     Heart sounds: Normal heart sounds.  Pulmonary:     Effort: Pulmonary effort is normal.     Breath sounds: Normal breath sounds.  Abdominal:     Palpations: Abdomen is soft.     Tenderness: There is no abdominal tenderness.  Musculoskeletal:     Comments: No rash or wound over planned puncture site; thoracic and lumbar spine nontender to palp  Skin:    General: Skin is warm and dry.  Neurological:     Mental Status: He is alert and oriented to person, place, and time.  Psychiatric:        Mood and Affect: Mood normal.        Behavior: Behavior normal.     Imaging: US  EKG SITE RITE Result Date: 10/22/2023 If Site Rite image not attached, placement could not be confirmed due to current cardiac rhythm.  ECHOCARDIOGRAM COMPLETE Result Date:  10/21/2023    ECHOCARDIOGRAM REPORT   Patient Name:   Jacob [name] Date of Exam: 10/21/2023 Medical Rec #:  [id number]         Height:       71.0 in Accession #:    [id number]        Weight:       178.6 lb Date of Birth:  04-07-1950         BSA:          2.009 m Patient Age:    73 years          BP:           144/96 mmHg Patient Gender: M                 HR:           51 bpm. Exam Location:  Inpatient Procedure: 2D Echo, Cardiac Doppler and Color Doppler (Both Spectral and Color            Flow Doppler were utilized during procedure). Indications:    Endocarditis I38  History:        Patient has prior history of Echocardiogram examinations, most                 recent 09/03/2023. CAD, Prior CABG, Stroke; Risk                 Factors:Hypertension.  Sonographer:    Jayson Gaskins Referring Phys: [phone] STEPHANIE N DIXON IMPRESSIONS  1. Left ventricular ejection fraction, by estimation, is 50 to 55%. The left ventricle has low normal function.  The left ventricle has no regional wall motion abnormalities. Left ventricular diastolic parameters are consistent with Grade I diastolic dysfunction (impaired relaxation).  2. Right ventricular systolic function is mildly reduced. The right ventricular size is normal.  3. The mitral valve is grossly normal. Trivial mitral valve regurgitation.  4. The aortic valve is tricuspid. There is mild calcification of the aortic valve. Aortic valve regurgitation is not visualized. Comparison(s): Prior images reviewed side by side. 09/02/2013: LVEF 55-60%. Compared to this prior study the mitral leaflets appear more thickened, but there is no obvious mobile vegetation. Conclusion(s)/Recommendation(s): No evidence of valvular vegetations on this transthoracic echocardiogram. Consider a transesophageal echocardiogram to exclude infective endocarditis if clinically indicated. FINDINGS  Left Ventricle: Left ventricular ejection fraction, by estimation, is 50 to 55%. The left ventricle has low  normal function. The left ventricle has no regional wall motion abnormalities. The left ventricular internal cavity size was normal in size. There is no left ventricular hypertrophy. Left ventricular diastolic parameters are consistent with Grade I diastolic dysfunction (impaired relaxation). Indeterminate filling pressures. Right Ventricle: The right ventricular size is normal. No increase in right ventricular wall thickness. Right ventricular systolic function is mildly reduced. Left Atrium: Left atrial size was normal in size. Right Atrium: Right atrial size was normal in size. Pericardium: There is no evidence of pericardial effusion. Mitral Valve: The mitral valve is grossly normal. There is mild thickening of the anterior and posterior mitral valve leaflet(s). Trivial mitral valve regurgitation. Tricuspid Valve: The tricuspid valve is grossly normal. Tricuspid valve regurgitation is trivial. Aortic Valve: The aortic valve is tricuspid. There is mild calcification of the aortic valve. Aortic valve regurgitation is not visualized. Aortic valve mean gradient measures 4.0 mmHg. Aortic valve peak gradient measures 7.2 mmHg. Aortic valve area, by VTI measures 2.76 cm. Pulmonic Valve: The pulmonic valve was not well visualized. Pulmonic valve regurgitation is not visualized. Aorta: The aortic root is normal in size and structure. Venous: The inferior vena cava was not well visualized. IAS/Shunts: No atrial level shunt detected by color flow Doppler.  LEFT VENTRICLE PLAX 2D LVIDd:         5.00 cm   Diastology LVIDs:         3.50 cm   LV e' medial:    8.70 cm/s LV PW:         1.00 cm   LV E/e' medial:  8.2 LV IVS:        0.70 cm   LV e' lateral:   5.87 cm/s LVOT diam:     1.90 cm   LV E/e' lateral: 12.1 LV SV:         63 LV SV Index:   31 LVOT Area:     2.84 cm  RIGHT VENTRICLE RV S prime:     7.18 cm/s TAPSE (M-mode): 1.8 cm LEFT ATRIUM           Index        RIGHT ATRIUM           Index LA Vol (A4C): 52.5 ml 26.13  ml/m  RA Area:     18.20 cm                                    RA Volume:   47.10 ml  23.44 ml/m  AORTIC VALVE AV Area (Vmax):    2.43 cm AV Area (Vmean):   2.71 cm  AV Area (VTI):     2.76 cm AV Vmax:           134.00 cm/s AV Vmean:          89.900 cm/s AV VTI:            0.229 m AV Peak Grad:      7.2 mmHg AV Mean Grad:      4.0 mmHg LVOT Vmax:         115.00 cm/s LVOT Vmean:        86.000 cm/s LVOT VTI:          0.223 m LVOT/AV VTI ratio: 0.97  AORTA Ao Root diam: 2.90 cm MITRAL VALVE MV Area (PHT): 2.87 cm    SHUNTS MV Decel Time: 264 msec    Systemic VTI:  0.22 m MV E velocity: 71.30 cm/s  Systemic Diam: 1.90 cm MV A velocity: 83.30 cm/s MV E/A ratio:  0.86 Vinie Maxcy MD Electronically signed by Vinie Maxcy MD Signature Date/Time: 10/21/2023/2:30:48 PM    Final    MR Shoulder Left W Wo Contrast Result Date: 10/20/2023 EXAM: MRI OF THE LEFT SHOULDER WITHOUT AND WITH CONTRAST 10/19/2023 07:31:02 PM TECHNIQUE: Multiplanar multisequence MRI of the left shoulder was performed without and with the administration of 8 mL of gadobutrol (GADAVIST) 1 MMOL/ML injection. COMPARISON: Shoulder radiographs 10/19/2023. CLINICAL HISTORY: Osteomyelitis suspected, shoulder. X-ray done. 8mL gadavist administered. FINDINGS: ROTATOR CUFF: Small partial-thickness articular surface tear of the posterior supraspinatus tendon on image 15 series 6. Subtle edema inferiorly in the supraspinatus muscle on image 13 series 6 potentially from mild muscle strain. Intact infraspinatus, subscapularis and teres minor tendons. No significant muscle edema or atrophy in the infraspinatus, subscapularis, and teres minor. BICEPS TENDON: Long head biceps tendon is intact and normally located. LABRUM: The glenoid labrum appears grossly intact. No paralabral cyst. GLENOHUMERAL JOINT: Physiologic amount of joint fluid. No high-grade cartilage loss. Normal alignment. AC JOINT AND ACROMIOCLAVICULAR ARCH: Mild degenerative arthropathy of the AC  joint. Type 2 (curve) subacromial morphology. No significant acromial downsloping or subacromial spur. Intact acromioclavicular and coracoclavicular ligaments. Trace edema along the coracoclavicular ligament without overt tear. BURSA: Trace subacromial subdeltoid bursitis without significant synovitis. BONE MARROW: No acute fracture or aggressive marrow replacing lesion. OUTLET SPACES: Normal MRI appearance of the quadrilateral space. No significant narrowing of the supraspinatus outlet. SOFT TISSUES: Diffuse edema and mild enhancement in the subclavius muscle may reflect a strain, inflammation, or denervation edema. The sternoclavicular joint was not included on today's exam, but if the patient has pain or tenderness over the sternoclavicular joint, additional imaging directed towards the sternoclavicular joint will be suggested. No focal abnormality of the subcutaneous soft tissues. IMPRESSION: 1. Small partial-thickness articular surface tear of the posterior supraspinatus tendon. 2. Diffuse subclavius muscular edema and mild enhancement, which may reflect strain, inflammation, or denervation edema. 3. Mild degenerative arthropathy of the [facility] joint with type 2 subacromial morphology. 4. Trace subacromial subdeltoid bursitis. 5. Trace edema along the coracoclavicular ligament without tear. 6. Subtle inferior supraspinatus muscular edema, suggestive of mild strain. Electronically signed by: Ryan Salvage MD 10/20/2023 09:10 AM EDT RP Workstation: [id number]   MR LUMBAR SPINE W WO CONTRAST Result Date: 10/19/2023 CLINICAL DATA:  Initial evaluation for acute osteomyelitis. EXAM: MRI LUMBAR SPINE WITHOUT AND WITH CONTRAST TECHNIQUE: Multiplanar and multiecho pulse sequences of the lumbar spine were obtained without and with intravenous contrast. CONTRAST:  8mL GADAVIST GADOBUTROL 1 MMOL/ML IV SOLN COMPARISON:  Prior MRI from 09/01/2023. FINDINGS:  Segmentation: Transitional lumbosacral anatomy with lumbarization  of the S1 segment and fairly well-formed S1-2 interspace is seen. Vertebral body count is made from the dens on this exam. Alignment: 3 mm retrolisthesis of L1 on L2, with trace degenerative retrolisthesis of L2 on L3 through L4-5. 3 mm anterolisthesis of L5 on S1. Findings chronic and degenerative. Vertebrae: Vertebral body height maintained without acute or chronic fracture. Changes of acute osteomyelitis discitis partially visualized at the level of T11-12, described on corresponding MRI of the thoracic spine. No other evidence for acute osteomyelitis discitis or septic arthritis within the lumbar spine. Bone marrow signal intensity within normal limits. No worrisome osseous lesions. No other abnormal marrow edema or enhancement. Conus medullaris and cauda equina: Conus extends to the L1 level. Conus and cauda equina appear normal. Paraspinal and other soft tissues: Diffuse edema present within the posterior paraspinous musculature of the lower back (series 6, images 17, 1). Findings are nonspecific, but could reflect changes of acute myositis. Possible muscular injury/strain could also be considered. No collections. Multiple T2 hyperintense cyst noted about the visualized kidneys, benign in appearance, no follow-up imaging recommended. Disc levels: L1-2: Retrolisthesis with diffuse disc bulge and disc desiccation. Superimposed small right subarticular disc protrusion. Mild facet hypertrophy. No significant spinal stenosis. Mild left L1 foraminal narrowing. Right neural foramen remains patent. L2-3: Diffuse disc bulge with disc desiccation. Reactive endplate spurring, greater on the left. Mild facet hypertrophy with trace joint effusions. No significant spinal stenosis. Mild left L2 foraminal narrowing. Right neural foramen remains patent. L3-4: Degenerate intervertebral disc space narrowing with disc desiccation diffuse disc bulge, asymmetric to the right. Reactive endplate spurring, also worse on the right.  Mild to moderate bilateral facet hypertrophy. Resultant mild narrowing of the right lateral recess. Central canal remains patent. Moderate right L3 foraminal stenosis for the left neural foramen remains patent. L4-5: Degenerative disc space narrowing with disc desiccation diffuse disc bulge. Small right extraforaminal disc protrusion closely approximates the exiting right L4 nerve root. Mild facet and ligament flavum hypertrophy. No significant spinal stenosis. Mild bilateral L4 foraminal narrowing. L5-S1: Anterolisthesis. Disc desiccation with diffuse disc bulge. Moderate bilateral facet arthrosis. No significant spinal stenosis. Mild bilateral L5 foraminal narrowing. S1-2: Transitional lumbosacral anatomy with a well-formed S1-2 interspace. No disc bulge or focal disc herniation. Mild right worse than left facet arthrosis. No stenosis. IMPRESSION: 1. No MRI evidence for acute osteomyelitis discitis or septic arthritis within the lumbar spine. 2. Diffuse edema within the posterior paraspinous musculature of the lower back, nonspecific, but could reflect changes of acute myositis. Possible muscular injury/strain could also be considered. No collections. 3. Underlying multilevel degenerative spondylosis and facet arthrosis as above. No significant spinal stenosis. Mild to moderate bilateral L3 through L5 foraminal narrowing as above. 4. Changes of acute osteomyelitis discitis at T11-12, described on corresponding MRI of the thoracic spine. Electronically Signed   By: Morene Hoard M.D.   On: 10/19/2023 20:18   MR THORACIC SPINE W WO CONTRAST Result Date: 10/19/2023 CLINICAL DATA:  Initial evaluation for thoracic osteomyelitis. EXAM: MRI THORACIC WITHOUT AND WITH CONTRAST TECHNIQUE: Multiplanar and multiecho pulse sequences of the thoracic spine were obtained without and with intravenous contrast. CONTRAST:  8mL GADAVIST GADOBUTROL 1 MMOL/ML IV SOLN COMPARISON:  None Available. FINDINGS: Alignment:  Exaggeration of the normal thoracic kyphosis. No significant listhesis. Vertebrae: Findings consistent with acute osteomyelitis discitis at T11-12. Associated exuberant reactive marrow edema and enhancement. Endplate irregularity about the T11-12 interspace, with mild height loss about the T11  and T12 vertebral bodies. Mild paraspinous edema/inflammation but no loculated paraspinous collections. No significant epidural involvement at this time. No other evidence for distant infection elsewhere within the thoracic spine. Vertebral body height otherwise maintained. Bone marrow signal intensity within normal limits. No worrisome osseous lesions. Cord:  Normal signal and morphology. Paraspinal and other soft tissues: Mild paraspinous inflammation adjacent to the T11-12 interspace. Small right pleural effusion. Ovoid T1/T2 hyperintense lesion at the posterior right pleural space measures 3.8 x 2.6 cm (series 20, image 32), nonspecific, but possibly a loculated complex effusion. Few small T2 hyperintense left renal cyst noted, benign in appearance, no follow-up imaging recommended. Disc levels: T1-2:  Unremarkable. T2-3: Unremarkable. T3-4:  Small right paracentral disc protrusion.  No stenosis. T4-5:  Small central disc protrusion without stenosis. T5-6:  Small right paracentral disc protrusion without stenosis. T6-7:  Small central disc protrusion without significant stenosis. T7-8: Tiny left paracentral disc protrusion without significant stenosis. T8-9: Central disc protrusion indents the ventral thecal sac. Minimal cord flattening without cord signal changes or significant spinal stenosis. Mild right-sided facet hypertrophy. Foramina remain patent. T9-10: Mild disc bulge with facet hypertrophy.  No stenosis. T10-11: Negative interspace. Mild right greater left facet hypertrophy. No stenosis. T11-12: Findings consistent with osteomyelitis discitis. No significant epidural involvement. No spinal stenosis. Foramina  remain grossly patent. T12-L1:  Negative interspace.  Mild facet hypertrophy.  No stenosis. IMPRESSION: 1. Findings consistent with acute osteomyelitis discitis at T11-12. No significant epidural involvement at this time. 2. Small right pleural effusion with 3.8 x 2.6 cm ovoid T1/T2 hyperintense lesion at the posterior right pleural space, nonspecific, but possibly a loculated complex effusion. Correlation with dedicated chest imaging suggested for further evaluation. 3. Underlying multilevel thoracic spondylosis as above without significant stenosis. Electronically Signed   By: Morene Hoard M.D.   On: 10/19/2023 19:58   DG Chest 2 View Result Date: 10/19/2023 CLINICAL DATA:  fluid buildup on lungs EXAM: CHEST - 2 VIEW COMPARISON:  10/07/23 FINDINGS: Low lung volumes. No new, airspace consolidation. Trace pleural effusion, unchanged. No pneumothorax. No cardiomegaly. CABG changes with sternotomy wires. No acute fracture or destructive lesion. IMPRESSION: Low lung volumes with a trace right pleural effusion. Otherwise, no acute cardiopulmonary abnormality. Electronically Signed   By: Rogelia Myers M.D.   On: 10/19/2023 12:51   DG Shoulder Left Result Date: 10/19/2023 CLINICAL DATA:  shoulder pain EXAM: LEFT SHOULDER - 2+ VIEW COMPARISON:  None Available. FINDINGS: No acute fracture or dislocation. There is no evidence of arthropathy or other focal bone abnormality. Soft tissues are unremarkable. Sternotomy wires and postsurgical changes of a prior CABG. IMPRESSION: No acute fracture or dislocation. Electronically Signed   By: Rogelia Myers M.D.   On: 10/19/2023 12:45   DG Chest 2 View Result Date: 10/07/2023 EXAM: 2 VIEW(S) XRAY OF THE CHEST 10/07/2023 09:23:06 AM COMPARISON: 09/21/2023 CLINICAL HISTORY: f/u pleural effusion. FINDINGS: LUNGS AND PLEURA: Right lower lobe volume loss and right midlung linear opacities, unchanged when accounting for differences in inspiratory volume. Small right  pleural effusion, unchanged. Trace right apical pneumothorax, unchanged. HEART AND MEDIASTINUM: Post median sternotomy and CABG. BONES AND SOFT TISSUES: No acute osseous abnormality. IMPRESSION: 1. Trace right apical pneumothorax, unchanged. 2. Small right pleural effusion, unchanged. 3. Right lower lobe volume loss and right midlung linear opacities, unchanged when accounting for differences in inspiratory volume. 4. Post median sternotomy and CABG. Electronically signed by: Waddell Calk MD 10/07/2023 01:50 PM EDT RP Workstation: [id number]    Labs:  CBC: Recent Labs    10/20/23 0615 10/21/23 0256 10/22/23 0303 10/23/23 0039  WBC 7.6 6.7 7.5 7.5  HGB 8.9* 8.8* 8.6* 7.8*  HCT 28.0* 28.0* 27.3* 24.9*  PLT 277 293 311 299    COAGS: Recent Labs    10/23/23 0039  INR 1.1    BMP: Recent Labs    10/19/23 1152 10/20/23 0615 10/21/23 0256 10/22/23 0303  NA 135 136 134* 136  K 3.8 3.9 3.5 3.8  CL 102 102 102 100  CO2 21* 24 25 27   GLUCOSE 91 101* 104* 115*  BUN 19 13 10 11   CALCIUM  8.8* 8.5* 8.5* 8.7*  CREATININE 1.14 1.05 1.23 1.20  GFRNONAA >60 >60 >60 >60    LIVER FUNCTION TESTS: Recent Labs    08/28/23 1440 08/31/23 1856 09/02/23 0424 09/04/23 0221 09/09/23 0228  BILITOT 1.0 0.5  --  0.5 0.4  AST 20 23  --  24 41  ALT 16 23  --  37 18  ALKPHOS 79 71  --  79 70  PROT 8.1 6.9 5.8* 6.3* 5.2*  ALBUMIN 3.6 2.7* 2.2* 2.1* 1.8*    TUMOR MARKERS: No results for input(s): AFPTM, CEA, CA199, CHROMGRNA in the last 8760 hours.  Assessment and Plan:  Request for  image guided T11-12 disc asp approved for 10/4. No contraindications for procedure identified in ROS, physical exam, or review of pre-sedation considerations. Labs reviewed and within acceptable range 9/30 imaging available and reviewed VSS, afebrile MD aware of AC and ok to proceed. Abx not indicated    Risks and benefits of disc aspiration was discussed with the patient and/or patient's  family including, but not limited to bleeding, infection, damage to adjacent structures or low yield requiring additional tests.  All of the questions were answered and there is agreement to proceed.  Consent signed and in chart.   Thank you for allowing our service to participate in Jacob [name] 's care.    Electronically Signed: Laymon Coast, NP   10/23/2023, 10:02 AM     I spent a total of 20 Minutes    in face to face in clinical consultation, greater than 50% of which was counseling/coordinating care for disc aspiration   (A copy of this note was sent to the referring provider and the time of visit.)

## 2023-10-23 NOTE — Progress Notes (Signed)
 MEDICATION-RELATED CONSULT NOTE   IR Procedure Consult - Anticoagulant/Antiplatelet PTA/Inpatient Med List Review by Pharmacist    Procedure: CT guided biopsy of T11-T12 disc aspiration    Completed: 12:56  Post-Procedural bleeding risk per IR MD assessment:  low  Antithrombotic medications on inpatient or PTA profile prior to procedure:    - Heparin  5000 units SQ q8hrs for VTE prophylaxis. - last dose given at 0555 this am    Recommended restart time per IR Post-Procedure Guidelines:   - at least 4 hours post-procedure  Other considerations:    - on Aspirin  81 mg daily as PTA; not held for procedure. Next dose in am.   Plan:     - Skip previously schedule 2pm dose of SQ heparin .  Resume at 10pm tonight.  Genaro Zebedee Calin, RPh 10/23/2023 1:20 PM

## 2023-10-23 NOTE — Progress Notes (Signed)
 Progress Note   Patient: Jacob [name] FMW:[id number] DOB: [birth year] DOA: 10/19/2023     4 DOS: the patient was seen and examined on 10/23/2023   Brief hospital course: Jacob [name] is a 73 y.o. male with medical history significant for CAD s/p CABG, CAS s/p right CEA, PAF not on AC, HTN, Crohn's disease, right MSSA empyema who is admitted with acute osteomyelitis discitis of T11-T12.  This was seen on outpatient MRI 9/11, read on 9/19 and referred to ED from Pulmonology follow up appointment due to worsening back and left shoulder pain in the setting of abnormal MRI findings.  Of note, pt had recent MSSA empyema during admission in August. See H&P for full HPI on admission & ED course.   Assessment and Plan:  Acute osteomyelitis discitis at T11-12: Presumed due to MSSA given recent empyema.   --ID following, appreciate recommendations --Neurosurgery was consulted in the ED, recs pending. Seen by Dr. Louis - agrees with IV antibiotics, rec IR to aspirate epidural abscess for culture, no indication for surgical intervention --Continue IV Ancef   --Follow blood cultures --2D echo negative for vegetations, may need TEE --IR consult for aspiration & culture of epidural abscess  - planned for today --Follow aspirate cultures   History of right MSSA empyema: admitted in August.  Has since completed course of oral antibiotics with Augmentin  as ID recommended that admission.  Imaging shows improvement in pleural effusion, small residual fluid remains. Seen  in Pulmonology clinic follow up 9/30 --EDP discussed case with Dr. Claudene.  Empyema felt to be improving.   Left shoulder pain: Patient reports pain limiting range of motion of left shoulder.  Pain seems to primarily be at the [facility] joint area.  He denies any injury.  Concern is for possible septic arthritis.   MRI left shoulder shows small partial-thickness tear of the posterior supraspinatus tendon, diffuse subclavius muscular edema and  mild enhancement which may reflect strain, inflammation or denervation edema... --Orthopedics consulted --pain control PRN --PT/OT --Outpatient follow up in 2-3 weeks   Paroxysmal atrial fibrillation: NSR on admission. --Pt has been off anticoagulation due to recent acute blood loss anemia from hemothorax.  --Continue amiodarone  and Toprol -XL --Continue on aspirin  81 mg daily   Acute on chronic anemia, normocytic Hx of recent ABLA due to hemothorax while on heparin  anticoagulation: Hbg slowly down-trending 9.6 >> 8.9 >> 8.8 >> 8.6 >> 7.8 --Check anemia panel -- low folate, iron levels pending --Monitor CBC --Monitor for bleeding --Transfuse for Hbg < 7  Folic acid deficiency Anemia panel showed folate level low at 5.1 --started folic acid supplement   CAD s/p CABG: --Continue aspirin , Toprol -XL, atorvastatin .   Hypertension: BP's controlled, soft this AM --Continue Toprol -XL  --Holding lisinopril  and amlodipine  - resume when BP indicates   Crohn's disease: Quiescent for many years, not on disease-specific therapy.      Subjective: Pt seen sitting up in bed, family at bedside.  He reports shoulder pain ongoing, has been placed in a sling.  He denies other acute complaints, but wanting to go home asap.   Physical Exam: Vitals:   10/23/23 0613 10/23/23 0759 10/23/23 1155 10/23/23 1200  BP: (!) 146/68 (!) 148/69 (!) 165/67 (!) 164/79  Pulse: 61 (!) 57 60 63  Resp: 18 16 19  (!) 21  Temp: 98.1 F (36.7 C) 98.5 F (36.9 C)    TempSrc: Oral Oral    SpO2: 94% 93% 99% 99%  Weight:      Height:  General exam: awake, alert, no acute distress HEENT: moist mucus membranes, hearing grossly normal  Respiratory system: on room air, normal respiratory effort. Cardiovascular system: normal S1/S2, RRR, no pedal edema.   Gastrointestinal system: soft, NT, ND Central nervous system: A&O x 3. no gross focal neurologic deficits, normal speech Extremities: moves all, no edema,  normal tone Skin: dry, intact, normal temperature Psychiatry: normal mood, congruent affect, judgement and insight appear normal  Data Reviewed:  Notable labs -- glucose 115, Ca 8.7, Hbg 11.0 >> 9.6 >> 8.9 >> 8.8 >> 8.6   10/2 Echo EF 50-55%, G1DD, no evidence of valvular vegetations seen.   Family Communication: son and granddaughter at bedside on rounds  Disposition: Status is: Inpatient Remains inpatient appropriate because: remains on IV antibiotics, ongoing evaluation   Planned Discharge Destination: Home with Home Health    Time spent: 40 minutes  Author: Burnard DELENA Cunning, DO 10/23/2023 12:04 PM  For on call review [email or web address].

## 2023-10-24 DIAGNOSIS — M4624 Osteomyelitis of vertebra, thoracic region: Secondary | ICD-10-CM | POA: Diagnosis not present

## 2023-10-24 LAB — HEMOGLOBIN AND HEMATOCRIT, BLOOD
HCT: 26.5 % — ABNORMAL LOW (ref 39.0–52.0)
Hemoglobin: 8.2 g/dL — ABNORMAL LOW (ref 13.0–17.0)

## 2023-10-24 LAB — IRON AND TIBC
Iron: 25 ug/dL — ABNORMAL LOW (ref 45–182)
Saturation Ratios: 16 % — ABNORMAL LOW (ref 17.9–39.5)
TIBC: 160 ug/dL — ABNORMAL LOW (ref 250–450)
UIBC: 135 ug/dL

## 2023-10-24 LAB — CULTURE, BLOOD (ROUTINE X 2)
Culture: NO GROWTH
Culture: NO GROWTH
Special Requests: ADEQUATE

## 2023-10-24 LAB — FERRITIN: Ferritin: 322 ng/mL (ref 24–336)

## 2023-10-24 MED ORDER — LISINOPRIL 20 MG PO TABS
20.0000 mg | ORAL_TABLET | Freq: Every day | ORAL | Status: DC
  Administered 2023-10-24 – 2023-10-26 (×3): 20 mg via ORAL
  Filled 2023-10-24 (×3): qty 1

## 2023-10-24 NOTE — Progress Notes (Signed)
 Progress Note   Patient: Jacob [name] FMW:[id number] DOB: [birth year] DOA: 10/19/2023     5 DOS: the patient was seen and examined on 10/24/2023   Brief hospital course: Jacob [name] is a 73 y.o. male with medical history significant for CAD s/p CABG, CAS s/p right CEA, PAF not on AC, HTN, Crohn's disease, right MSSA empyema who is admitted with acute osteomyelitis discitis of T11-T12.  This was seen on outpatient MRI 9/11, read on 9/19 and referred to ED from Pulmonology follow up appointment due to worsening back and left shoulder pain in the setting of abnormal MRI findings.  Of note, pt had recent MSSA empyema during admission in August. See H&P for full HPI on admission & ED course.   Assessment and Plan:  Acute osteomyelitis discitis at T11-12: Presumed due to MSSA given recent empyema.   --ID following, appreciate recommendations --Neurosurgery was consulted in the ED, recs pending. Seen by Dr. Louis - agrees with IV antibiotics, rec IR to aspirate epidural abscess for culture, no indication for surgical intervention --Continue IV Ancef   --PICC line placed 10/4 --IR aspirated T11-12 disc on 10/4 - follow cultures --Follow blood cultures - neg to date --2D echo negative for vegetations, may need TEE   History of right MSSA empyema: admitted in August.  Has since completed course of oral antibiotics with Augmentin  as ID recommended that admission.  Imaging shows improvement in pleural effusion, small residual fluid remains. Seen  in Pulmonology clinic follow up 9/30 --EDP discussed case with Dr. Claudene.  Empyema felt to be improving. --Outpatient follow up with pulmonology --Monitor respiratory status -- has been stable   Left shoulder pain: Patient reports pain limiting range of motion of left shoulder.  Pain seems to primarily be at the [facility] joint area.  He denies any injury.  Concern is for possible septic arthritis.   MRI left shoulder shows small partial-thickness tear of  the posterior supraspinatus tendon, diffuse subclavius muscular edema and mild enhancement which may reflect strain, inflammation or denervation edema... --Orthopedics consulted --pain control PRN --PT/OT --Outpatient follow up in 2-3 weeks   Paroxysmal atrial fibrillation: NSR on admission. --Pt has been off anticoagulation due to recent acute blood loss anemia from hemothorax.  --Continue amiodarone  and Toprol -XL --Continue on aspirin  81 mg daily   Acute on chronic anemia, normocytic Hx of recent ABLA due to hemothorax while on heparin  anticoagulation: Hbg slowly down-trending 9.6 >> 8.9 >> 8.8 >> 8.6 >> 7.8 >> 8.2 --Check anemia panel -- low folate --Hold off iron infusion w infection, start PO iron once constipation improves --Monitor CBC --Monitor for bleeding --Transfuse for Hbg < 7  Folic acid deficiency Anemia panel showed folate level low at 5.1 --started folic acid supplement   CAD s/p CABG: --Continue aspirin , Toprol -XL, atorvastatin .   Hypertension: BP's controlled, soft this AM --Continue Toprol -XL  --Holding lisinopril  and amlodipine  - resume when BP indicates   Crohn's disease: Quiescent for many years, not on disease-specific therapy.      Subjective: Pt awake sitting up in bed this AM on rounds. He reports ongoing left shoulder pain.  Back pain only when he's moved around.  No fever/chills but little hot overnight.  He denies other complaints.   Physical Exam: Vitals:   10/23/23 2005 10/24/23 0550 10/24/23 0800 10/24/23 1007  BP: (!) 170/65 (!) 155/71 (!) 149/75 (!) 149/57  Pulse: 73 62 (!) 57 (!) 57  Resp:   16   Temp: 98.2 F (36.8 C) 97.7  F (36.5 C) 98 F (36.7 C)   TempSrc: Oral Oral Oral   SpO2: 99% 97% 97%   Weight:      Height:       General exam: awake, alert, no acute distress HEENT: moist mucus membranes, hearing grossly normal  Respiratory system: on room air, normal respiratory effort. CTAB no wheezes or rhonchi Cardiovascular  system: normal S1/S2, RRR, no pedal edema.   Gastrointestinal system: soft, NT, ND Central nervous system: A&O x 3. no gross focal neurologic deficits, normal speech Extremities: left shoulder in sling, no edema, normal tone Skin: dry, intact, normal temperature Psychiatry: normal mood, congruent affect, judgement and insight appear normal  Data Reviewed:  Notable labs -- Hbg 11.0 >> 9.6 >> 8.9 >> 8.8 >> 8.6 >> 7.8 >> 8.2 improved today  Iron 25 / TIBC 160 / sat ratio 16% B12 low normal 222 Folic acid low 5.1   10/2 Echo EF 50-55%, G1DD, no evidence of valvular vegetations seen.   Family Communication: son and granddaughter at bedside on rounds 10/4.  Wife present prior days. None in room on rounds today.   Disposition: Status is: Inpatient Remains inpatient appropriate because: remains on IV antibiotics, ongoing evaluation   Planned Discharge Destination: Home with Home Health    Time spent: 35 minutes  Author: Burnard DELENA Cunning, DO 10/24/2023 1:46 PM  For on call review [email or web address].

## 2023-10-24 NOTE — Progress Notes (Signed)
 Mobility Specialist Progress Note:   10/24/23 1135  Mobility  Activity Ambulated with assistance (In hallway)  Level of Assistance Contact guard assist, steadying assist  Assistive Device Other (Comment) (IV Pole)  Distance Ambulated (ft) 255 ft  LUE Weight Bearing Per Provider Order WBAT  Activity Response Tolerated well  Mobility Referral Yes  Mobility visit 1 Mobility  Mobility Specialist Start Time (ACUTE ONLY) 1105  Mobility Specialist Stop Time (ACUTE ONLY) 1121  Mobility Specialist Time Calculation (min) (ACUTE ONLY) 16 min   Received pt in bed and agreeable to mobility. Pt required MinG for safety. No c/o. Returned to room without fault. Left pt in bed with alarm on. Personal belongings and call light within reach. All needs met.  Lavanda Pollack Mobility Specialist  Please contact via [employer] or  Rehab Office [phone]

## 2023-10-24 NOTE — Progress Notes (Signed)
 Cardiac monitoring orders need to be renewed. Griffith,DO made aware

## 2023-10-24 NOTE — Progress Notes (Signed)
 CCMD called stated pt was reading asystole. Went in pt room and he's sitting up in bed talking to his visitors. Check monitor and replugged it. All lead on

## 2023-10-25 DIAGNOSIS — M25512 Pain in left shoulder: Secondary | ICD-10-CM | POA: Diagnosis not present

## 2023-10-25 DIAGNOSIS — M4645 Discitis, unspecified, thoracolumbar region: Secondary | ICD-10-CM | POA: Diagnosis not present

## 2023-10-25 DIAGNOSIS — J869 Pyothorax without fistula: Secondary | ICD-10-CM | POA: Diagnosis not present

## 2023-10-25 DIAGNOSIS — W19XXXA Unspecified fall, initial encounter: Secondary | ICD-10-CM

## 2023-10-25 DIAGNOSIS — M75112 Incomplete rotator cuff tear or rupture of left shoulder, not specified as traumatic: Secondary | ICD-10-CM | POA: Diagnosis not present

## 2023-10-25 DIAGNOSIS — B9561 Methicillin susceptible Staphylococcus aureus infection as the cause of diseases classified elsewhere: Secondary | ICD-10-CM | POA: Diagnosis not present

## 2023-10-25 DIAGNOSIS — M4624 Osteomyelitis of vertebra, thoracic region: Secondary | ICD-10-CM | POA: Diagnosis not present

## 2023-10-25 LAB — BASIC METABOLIC PANEL WITH GFR
Anion gap: 8 (ref 5–15)
BUN: 11 mg/dL (ref 8–23)
CO2: 28 mmol/L (ref 22–32)
Calcium: 8.6 mg/dL — ABNORMAL LOW (ref 8.9–10.3)
Chloride: 101 mmol/L (ref 98–111)
Creatinine, Ser: 1.03 mg/dL (ref 0.61–1.24)
GFR, Estimated: >60 mL/min (ref >60)
Glucose, Bld: 94 mg/dL (ref 70–99)
Potassium: 3.9 mmol/L (ref 3.5–5.1)
Sodium: 137 mmol/L (ref 135–145)

## 2023-10-25 LAB — CBC
HCT: 28.4 % — ABNORMAL LOW (ref 39.0–52.0)
Hemoglobin: 8.6 g/dL — ABNORMAL LOW (ref 13.0–17.0)
MCH: 27.8 pg (ref 26.0–34.0)
MCHC: 30.3 g/dL (ref 30.0–36.0)
MCV: 91.9 fL (ref 80.0–100.0)
Platelets: 326 K/uL (ref 150–400)
RBC: 3.09 MIL/uL — ABNORMAL LOW (ref 4.22–5.81)
RDW: 14.2 % (ref 11.5–15.5)
WBC: 6.6 K/uL (ref 4.0–10.5)
nRBC: 0 % (ref 0.0–0.2)

## 2023-10-25 LAB — MAGNESIUM: Magnesium: 2.1 mg/dL (ref 1.7–2.4)

## 2023-10-25 LAB — PHOSPHORUS: Phosphorus: 3.7 mg/dL (ref 2.5–4.6)

## 2023-10-25 MED ORDER — VITAMIN C 500 MG PO TABS
500.0000 mg | ORAL_TABLET | Freq: Every day | ORAL | Status: DC
Start: 2023-10-25 — End: 2023-10-26
  Administered 2023-10-25 – 2023-10-26 (×2): 500 mg via ORAL
  Filled 2023-10-25 (×2): qty 1

## 2023-10-25 MED ORDER — POLYSACCHARIDE IRON COMPLEX 150 MG PO CAPS
150.0000 mg | ORAL_CAPSULE | Freq: Every day | ORAL | Status: DC
  Administered 2023-10-25 – 2023-10-26 (×2): 150 mg via ORAL
  Filled 2023-10-25 (×2): qty 1

## 2023-10-25 MED ORDER — CEFAZOLIN IV (FOR PTA / DISCHARGE USE ONLY): 2.0000 g | Freq: Three times a day (TID) | INTRAVENOUS | 0 refills | Status: AC

## 2023-10-25 MED ORDER — CYANOCOBALAMIN 1000 MCG/ML IJ SOLN
1000.0000 ug | Freq: Every day | INTRAMUSCULAR | Status: DC
  Administered 2023-10-25 – 2023-10-26 (×2): 1000 ug via INTRAMUSCULAR
  Filled 2023-10-25 (×2): qty 1

## 2023-10-25 MED ORDER — VITAMIN B-12 1000 MCG PO TABS: 1000.0000 ug | ORAL_TABLET | Freq: Every day | ORAL | Status: DC

## 2023-10-25 NOTE — Progress Notes (Signed)
 Physical Therapy Treatment Patient Details Name: Jacob [name] MRN: [id number] DOB: [birth year] Today's Date: 10/25/2023   History of Present Illness Jacob [name] is a 73 y.o. male admitted 10/19/23 for acute osteomyelitis discitis of T11-T12. Seen on outpatient MRI 9/11, read 9/19; progression noted on repeat scan 9/30. Additionally, pt with significant multilevel thoracic disc degeneration with some small central disc bulges which do not cause severe cord compression. MRI left shoulder shows small partial-thickness tear of the posterior supraspinatus tendon, diffuse subclavius muscular edema and mild enhancement which may reflect strain, inflammation or denervation edema. Neurosurgery recommending aspiration by IR, IV antibiotics x6 weeks, and ID consult. PMHx: Crohn's disease, CAD s/p CABG, CVA, carotid artery stenosis c/p CEA, HTN, HLD, and  right MSSA empyema.    PT Comments  Pt received in bed and expresses hope that he will go home tomorrow. Pt practiced ambulation with rollator today as son recently purchased one for him. He was able to guide it with R hand as he had LUE in sling. No overt LOB during 250' but occasional sidestep for correction. Educated pt in placing it against wall before sitting down for increased stability. Pt able to STS with supervision. From a mobility standpoint pt ready for d/c home with HHPT.     If plan is discharge home, recommend the following: A little help with walking and/or transfers;A little help with bathing/dressing/bathroom;Assistance with cooking/housework;Assist for transportation;Help with stairs or ramp for entrance   Can travel by private vehicle        Equipment Recommendations  None recommended by PT (Pt already has DME)    Recommendations for Other Services       Precautions / Restrictions Precautions Precautions: Fall Recall of Precautions/Restrictions: Impaired Precaution/Restrictions Comments: No formal orders for back  precautions, followed for as a conservative measure. Required Braces or Orthoses: Sling (for comfort) Restrictions Weight Bearing Restrictions Per Provider Order: Yes LUE Weight Bearing Per Provider Order: Weight bearing as tolerated Other Position/Activity Restrictions: pt does not recall this and reports he cannot put wt through LUE     Mobility  Bed Mobility Overal bed mobility: Needs Assistance Bed Mobility: Supine to Sit, Sit to Supine     Supine to sit: Supervision Sit to supine: Supervision        Transfers Overall transfer level: Needs assistance Equipment used: Rollator (4 wheels) Transfers: Sit to/from Stand Sit to Stand: Supervision           General transfer comment: pt able to set brakes of rollator with R hand before getting up Practiced sit>stand from rollator and advised to set it against a wall before sitting down which he practiced doing.    Ambulation/Gait Ambulation/Gait assistance: Contact guard assist Gait Distance (Feet): 250 Feet Assistive device: Rollator (4 wheels) Gait Pattern/deviations: Step-to pattern, Decreased step length - right, Decreased step length - left, Decreased stride length, Narrow base of support, Trunk flexed Gait velocity: decreased Gait velocity interpretation: 1.31 - 2.62 ft/sec, indicative of limited community ambulator   General Gait Details: pt prefers not to use L hand on RW, was in sling but with swivel wheels was able to guide rollator with R hand. No overt LOB but guarded gait   Stairs             Wheelchair Mobility     Tilt Bed    Modified Rankin (Stroke Patients Only)       Balance Overall balance assessment: Needs assistance Sitting-balance support: Bilateral upper extremity supported, Feet  supported Sitting balance-Leahy Scale: Good     Standing balance support: Bilateral upper extremity supported, During functional activity, Reliant on assistive device for balance Standing balance-Leahy  Scale: Poor Standing balance comment: Pt dependent on RW, can stand statically with fair balance                            Communication Communication Communication: No apparent difficulties Factors Affecting Communication: Other (comment) (pt minimally conversive today)  Cognition Arousal: Alert Behavior During Therapy: Flat affect                           PT - Cognition Comments: decreased ability to recall previous teaching Following commands: Impaired Following commands impaired: Follows one step commands with increased time    Cueing Cueing Techniques: Verbal cues  Exercises      General Comments General comments (skin integrity, edema, etc.): education given on posture with mobility. Practiced standing against wall. Pt able to get shoulder to wall but head ~3 in away. Pt tolerated well x 3 mins      Pertinent Vitals/Pain Pain Assessment Pain Assessment: 0-10 Pain Score: 8  Pain Location: L shoulder Pain Descriptors / Indicators: Aching Pain Intervention(s): Limited activity within patient's tolerance, Monitored during session    Home Living                          Prior Function            PT Goals (current goals can now be found in the care plan section) Acute Rehab PT Goals Patient Stated Goal: Return Home PT Goal Formulation: With patient Time For Goal Achievement: 11/05/23 Potential to Achieve Goals: Good Progress towards PT goals: Progressing toward goals    Frequency    Min 2X/week      PT Plan      Co-evaluation              AM-PAC PT 6 Clicks Mobility   Outcome Measure  Help needed turning from your back to your side while in a flat bed without using bedrails?: A Little Help needed moving from lying on your back to sitting on the side of a flat bed without using bedrails?: A Little Help needed moving to and from a bed to a chair (including a wheelchair)?: A Little Help needed standing up from a  chair using your arms (e.g., wheelchair or bedside chair)?: A Little Help needed to walk in hospital room?: A Little Help needed climbing 3-5 steps with a railing? : A Lot 6 Click Score: 17    End of Session Equipment Utilized During Treatment: Gait belt Activity Tolerance: Patient tolerated treatment well Patient left: with call bell/phone within reach;in bed;with family/visitor present Nurse Communication: Mobility status PT Visit Diagnosis: Difficulty in walking, not elsewhere classified (R26.2);Unsteadiness on feet (R26.81);Pain Pain - Right/Left: Left Pain - part of body: Shoulder     Time: 1354-1415 PT Time Calculation (min) (ACUTE ONLY): 21 min  Charges:    $Gait Training: 8-22 mins PT General Charges $$ ACUTE PT VISIT: 1 Visit                     Richerd Lipoma, PT  Acute Rehab Services Secure chat preferred Office [phone]    Richerd CROME Caeli Linehan 10/25/2023, 2:40 PM

## 2023-10-25 NOTE — Plan of Care (Signed)
  Problem: Pain Managment: Goal: General experience of comfort will improve and/or be controlled Outcome: Progressing   Problem: Safety: Goal: Ability to remain free from injury will improve Outcome: Progressing

## 2023-10-25 NOTE — Progress Notes (Signed)
 Progress Note   Patient: Jacob [name] FMW:[id number] DOB: [birth year] DOA: 10/19/2023     6 DOS: the patient was seen and examined on 10/25/2023   Brief hospital course: Jacob [name] is a 73 y.o. male with medical history significant for CAD s/p CABG, CAS s/p right CEA, PAF not on AC, HTN, Crohn's disease, right MSSA empyema who is admitted with acute osteomyelitis discitis of T11-T12.  This was seen on outpatient MRI 9/11, read on 9/19 and referred to ED from Pulmonology follow up appointment due to worsening back and left shoulder pain in the setting of abnormal MRI findings.  Of note, pt had recent MSSA empyema during admission in August. See H&P for full HPI on admission & ED course.   Assessment and Plan:  Acute osteomyelitis discitis at T11-12: Presumed due to MSSA given recent empyema.   --ID following, appreciate recommendations --Neurosurgery was consulted in the ED, recs pending. Seen by Dr. Louis - agrees with IV antibiotics, rec IR to aspirate epidural abscess for culture, no indication for surgical intervention --Continue IV Ancef   --s/p PICC line placed on 10/4 --IR aspirated T11-12 disc on 10/4 - cultures NGTD --Follow blood cultures - neg to date --2D echo negative for vegetations, but noticed mitral valve thickening.  No need of TEE as per ID, will need TTE after the treatment as an outpatient. TOC consulted for IV antibiotics arrangement at home. Patient can be discharged home once ID finalized the duration of antibiotics and home arrangement by TOC.   History of right MSSA empyema: admitted in August.  Has since completed course of oral antibiotics with Augmentin  as ID recommended that admission.  Imaging shows improvement in pleural effusion, small residual fluid remains. Seen  in Pulmonology clinic follow up 9/30 --EDP discussed case with Dr. Claudene.  Empyema felt to be improving. --Outpatient follow up with pulmonology --Monitor respiratory status -- has been  stable   Left shoulder pain: Patient reports pain limiting range of motion of left shoulder.  Pain seems to primarily be at the [facility] joint area.  He denies any injury.  Concern is for possible septic arthritis.   MRI left shoulder shows small partial-thickness tear of the posterior supraspinatus tendon, diffuse subclavius muscular edema and mild enhancement which may reflect strain, inflammation or denervation edema... --Orthopedics consulted --pain control PRN --PT/OT --Outpatient follow up in 2-3 weeks   Paroxysmal atrial fibrillation: NSR on admission. --Pt has been off anticoagulation due to recent acute blood loss anemia from hemothorax.  --Continue amiodarone  and Toprol -XL --Continue on aspirin  81 mg daily   Acute on chronic anemia, normocytic Hx of recent ABLA due to hemothorax while on heparin  anticoagulation: Hbg slowly down-trending 9.6 >> 8.9 >> 8.8 >> 8.6 >> 7.8 >> 8.2 --Check anemia panel -- low folate --Hold off iron infusion w infection, start PO iron once constipation improves --Monitor CBC --Monitor for bleeding --Transfuse for Hbg < 7  Folic acid deficiency Anemia panel showed folate level low at 5.1 --started folic acid supplement  # Iron deficiency, Tsat 16%, started oral iron supplement with vitamin C.  Follow with PCP to repeat iron profile after 3 to 6 months.  # Vitamin B12 level 222, goal >400, Started vitamin B12 1000 mcg IM injection daily during hospital stay, followed by oral supplement.  Follow-up PCP to repeat vitamin B12 level after 3 to 6 months.  CAD s/p CABG: --Continue aspirin , Toprol -XL, atorvastatin .   Hypertension: BP's controlled, soft this AM --Continue Toprol -XL  --Holding lisinopril   and amlodipine  - resume when BP indicates   Crohn's disease: Quiescent for many years, not on disease-specific therapy.      Subjective: Patient was resting comfortably in the bed, stated that his pain is mainly in the shoulder 7/10 but patient would  like to go home.  Patient was advised that antibiotic needs to be arranged before discharge and we need to consult ID for duration of antibiotics. Patient agreed with the plan.   Physical Exam: Vitals:   10/24/23 2013 10/25/23 0437 10/25/23 0808 10/25/23 0932  BP: (!) 159/80 (!) 148/67 (!) 159/67 (!) 156/81  Pulse: (!) 56 (!) 54 (!) 53 (!) 51  Resp: 18 18 17 17   Temp: 97.8 F (36.6 C) 97.6 F (36.4 C) 97.8 F (36.6 C) 97.8 F (36.6 C)  TempSrc: Oral Oral Oral Oral  SpO2: 97% 97% 98% 98%  Weight:      Height:       General exam: awake, alert, no acute distress HEENT: moist mucus membranes, hearing grossly normal  Respiratory system: on room air, normal respiratory effort. CTAB no wheezes or rhonchi Cardiovascular system: normal S1/S2, RRR, no pedal edema.   Gastrointestinal system: soft, NT, ND Central nervous system: A&O x 3. no gross focal neurologic deficits, normal speech Extremities: left shoulder in sling, no edema, normal tone Skin: dry, intact, normal temperature Psychiatry: normal mood, congruent affect, judgement and insight appear normal  Data Reviewed:  Notable labs -- Hbg 11.0 >> 9.6 >> 8.9 >> 8.8 >> 8.6 >> 7.8 >> 8.6 improved today  Iron 25 / TIBC 160 / sat ratio 16% B12 low normal 222 Folic acid low 5.1   10/2 Echo EF 50-55%, G1DD, no evidence of valvular vegetations seen.   Family Communication:  10/6 patient's spouse was at bedside   Disposition: Status is: Inpatient Remains inpatient appropriate because: remains on IV antibiotics, ongoing evaluation   Planned Discharge Destination: Home with Home Health    Time spent: 35 minutes  Author: Elvan Sor, MD 10/25/2023 3:17 PM  For on call review [email or web address].

## 2023-10-25 NOTE — Progress Notes (Signed)
 PHARMACY CONSULT NOTE FOR:  OUTPATIENT  PARENTERAL ANTIBIOTIC THERAPY (OPAT)  Indication: Discitis Regimen: Cefazolin  2g IV every 8 hours End date: 12/01/23  IV antibiotic discharge orders are pended. To discharging provider:  please sign these orders via discharge navigator,  Select New Orders & click on the button choice - Manage This Unsigned Work.     Thank you for allowing pharmacy to be a part of this patient's care.  Almarie Lunger, PharmD, BCPS, BCIDP Infectious Diseases Clinical Pharmacist 10/25/2023 3:48 PM   **Pharmacist phone directory can now be found on [email or web address] ([email or web address]).  Listed under [facility].

## 2023-10-25 NOTE — Plan of Care (Signed)
  Problem: Pain Managment: Goal: General experience of comfort will improve and/or be controlled Outcome: Progressing   Problem: Safety: Goal: Ability to remain free from injury will improve Outcome: Progressing   Problem: Skin Integrity: Goal: Risk for impaired skin integrity will decrease Outcome: Progressing

## 2023-10-25 NOTE — Progress Notes (Signed)
 Occupational Therapy Treatment Patient Details Name: Jacob [name] MRN: [id number] DOB: February 14, 1950 Today's Date: 10/25/2023   History of present illness Jacob [name] is a 73 y.o. male admitted 10/19/23 for acute osteomyelitis discitis of T11-T12. Seen on outpatient MRI 9/11, read 9/19; progression noted on repeat scan 9/30. Additionally, pt with significant multilevel thoracic disc degeneration with some small central disc bulges which do not cause severe cord compression. MRI left shoulder shows small partial-thickness tear of the posterior supraspinatus tendon, diffuse subclavius muscular edema and mild enhancement which may reflect strain, inflammation or denervation edema. Neurosurgery recommending aspiration by IR, IV antibiotics x6 weeks, and ID consult. PMHx: Crohn's disease, CAD s/p CABG, CVA, carotid artery stenosis c/p CEA, HTN, HLD, and  right MSSA empyema.   OT comments  Pt without complaints of back pain, but continues to have moderate L shoulder pain. Able to use L UE effectively for UB bathing, UB dressing and to apply deodorant. He can doff sling independently, max assist to don. Pt ambulated to bathroom to stand and urinate with supervision for safety and RW. Pt quiet today, eager to go home. Wife at bedside and encouraging pt to work with OT.       If plan is discharge home, recommend the following:  A little help with walking and/or transfers;A lot of help with bathing/dressing/bathroom;Assistance with cooking/housework;Assistance with feeding;Direct supervision/assist for medications management;Direct supervision/assist for financial management;Assist for transportation;Help with stairs or ramp for entrance   Equipment Recommendations  None recommended by OT    Recommendations for Other Services      Precautions / Restrictions Precautions Precautions: Fall Recall of Precautions/Restrictions: Impaired Required Braces or Orthoses: Sling (for  comfort) Restrictions Weight Bearing Restrictions Per Provider Order: Yes LUE Weight Bearing Per Provider Order: Weight bearing as tolerated       Mobility Bed Mobility Overal bed mobility: Needs Assistance Bed Mobility: Supine to Sit, Sit to Supine     Supine to sit: Supervision Sit to supine: Supervision        Transfers Overall transfer level: Needs assistance Equipment used: Rolling walker (2 wheels) Transfers: Sit to/from Stand Sit to Stand: Contact guard assist           General transfer comment: no physical assist, increased effort from low bed     Balance Overall balance assessment: Needs assistance   Sitting balance-Leahy Scale: Good     Standing balance support: Bilateral upper extremity supported, During functional activity, Reliant on assistive device for balance Standing balance-Leahy Scale: Poor Standing balance comment: Pt dependent on RW, can stand statically with fair balance                           ADL either performed or assessed with clinical judgement   ADL Overall ADL's : Needs assistance/impaired     Grooming: Wash/dry hands;Wash/dry face;Sitting;Set up   Upper Body Bathing: Minimal assistance;Sitting Upper Body Bathing Details (indicate cue type and reason): assisted for back     Upper Body Dressing : Minimal assistance;Sitting Upper Body Dressing Details (indicate cue type and reason): max assist for sling     Toilet Transfer: Supervision/safety;Rolling walker (2 wheels);Ambulation           Functional mobility during ADLs: Supervision/safety;Rolling walker (2 wheels)      Extremity/Trunk Assessment              Vision       Perception     Praxis  Communication Communication Factors Affecting Communication: Other (comment) (pt minimally conversive today)   Cognition Arousal: Alert Behavior During Therapy: Flat affect Cognition: History of cognitive impairments             OT -  Cognition Comments: pt with baseline impairment in memory and safety per wife                 Following commands: Impaired Following commands impaired: Follows one step commands with increased time      Cueing   Cueing Techniques: Verbal cues  Exercises      Shoulder Instructions       General Comments      Pertinent Vitals/ Pain       Pain Assessment Pain Assessment: Faces Faces Pain Scale: Hurts little more Pain Location: L shoulder Pain Descriptors / Indicators: Aching Pain Intervention(s): Premedicated before session, Repositioned  Home Living                                          Prior Functioning/Environment              Frequency  Min 2X/week        Progress Toward Goals  OT Goals(current goals can now be found in the care plan section)  Progress towards OT goals: Progressing toward goals  Acute Rehab OT Goals OT Goal Formulation: With patient/family Time For Goal Achievement: 11/05/23 Potential to Achieve Goals: Good ADL Goals Pt Will Perform Grooming: with supervision;standing;with caregiver independent in assisting Pt Will Perform Upper Body Dressing: with min assist;with caregiver independent in assisting;sitting Pt Will Perform Lower Body Dressing: with min assist;with caregiver independent in assisting;sit to/from stand Pt Will Transfer to Toilet: with supervision;ambulating;bedside commode Pt Will Perform Toileting - Clothing Manipulation and hygiene: sit to/from stand;with supervision Additional ADL Goal #1: Pt and family will be knowledgeable in donning/doffing L UE sling and positioning L UE for comfort.  Plan      Co-evaluation                 AM-PAC OT 6 Clicks Daily Activity     Outcome Measure   Help from another person eating meals?: None Help from another person taking care of personal grooming?: A Little Help from another person toileting, which includes using toliet, bedpan, or urinal?: A  Little Help from another person bathing (including washing, rinsing, drying)?: A Little Help from another person to put on and taking off regular upper body clothing?: A Little Help from another person to put on and taking off regular lower body clothing?: A Little 6 Click Score: 19    End of Session Equipment Utilized During Treatment: Rolling walker (2 wheels);Gait belt  OT Visit Diagnosis: Unsteadiness on feet (R26.81);Other abnormalities of gait and mobility (R26.89);Pain;Muscle weakness (generalized) (M62.81);Other symptoms and signs involving cognitive function Pain - Right/Left: Left Pain - part of body: Shoulder   Activity Tolerance Patient tolerated treatment well   Patient Left in bed;with call bell/phone within reach;with family/visitor present   Nurse Communication          Time: 8887-8863 OT Time Calculation (min): 24 min  Charges: OT General Charges $OT Visit: 1 Visit OT Treatments $Self Care/Home Management : 23-37 mins  Jacob [name], OTR/L Acute Rehabilitation Services Office: [phone]   Jacob [name] 10/25/2023, 11:45 AM

## 2023-10-25 NOTE — Plan of Care (Signed)
   Problem: Health Behavior/Discharge Planning: Goal: Ability to manage health-related needs will improve Outcome: Progressing   Problem: Clinical Measurements: Goal: Ability to maintain clinical measurements within normal limits will improve Outcome: Progressing Goal: Diagnostic test results will improve Outcome: Progressing Goal: Respiratory complications will improve Outcome: Progressing

## 2023-10-25 NOTE — Progress Notes (Signed)
 [facility]  Date of Admission:  10/19/2023      Total days of antibiotics 6   Cefazolin            ASSESSMENT: Jacob [name] is a 73 y.o. male admitted with     New T11-12 Discitis w/o Abscess -  Most likely this would be related to recent MSSA infection. Blood cultures drawn on admit that remain negative to date. No systemic features prior to admission. Discussed these findings and warrant prolonged IV antibiotics. IR biopsy obtained and no growth day 2 so far.  Crp 14.5, ESR 95 baseline  Continue cefazolin  - we can change therapy outpatient if he is ready to go otherwise.  TTE - the mitral leaflets appear more thickened, but there is no obvious mobile vegetation. No murmur and TEE won't change current plan for management as no surgical indications on TTE. Will defer TEE and continue with prolonged outpatient antibiotic treatment.    Left Shoulder Pain -  Acute in the setting of fall at home. Small partial thickness tear of posterior supraspinatus tendun, mild AC joint arthropathy, bursitis, trace edema of supraspinatus muscle indicative of strain. No effusion.  Per primary - sling applied    Empyema, MSSA -  Small pleural effusion noted.   Vascular Access -  -PICC in place -Home health orders to maintain PICC line care and education for patient described below   Discharge Planning / Coordination of Care -  -Outpatient antibiotics set  -Discussed with Holley Herring, ID pharmacy and primary   Medication Monitoring -  -Safety labs ordered and detailed below to be followed in OPAT clinic    PLAN: Continue IV cefazolin  for 6 week duration for discitis Follow micro to maturity  Defer TEE as above - TTE at end of therapy PICC in place - home health to come with patient teaching  IV abx as outlined below   OPAT ORDERS:  Diagnosis: Vertebral discitis   Culture Result: none pending (h/o MSSA)   No Known Allergies   Discharge  antibiotics to be given via PICC line:  Cefazolin  2 gm IV TID    Duration: 6 weeks   End Date: 12/01/2023  [facility] Care Per Protocol with Biopatch Use: Home health RN for IV administration and teaching, line care and labs.    Labs weekly while on IV antibiotics: _x_ CBC with differential __ BMP **TWICE WEEKLY ON VANCOMYCIN   _x_ CMP _x_ CRP _x_ ESR __ Vancomycin  trough TWICE WEEKLY __ CK  _x_ Please pull PIC at completion of IV antibiotics __ Please leave PIC in place until doctor has seen patient or been notified  Fax weekly labs to [phone]  Clinic Follow Up Appt: 11/08/2023 @ 9:45 am with Dr. Luiz    Principal Problem:   Acute osteomyelitis discitis of thoracic spine T11-T12 (HCC) Active Problems:   Coronary artery disease due to lipid rich plaque   Essential hypertension, benign   Empyema of right pleural space (HCC)   Paroxysmal atrial fibrillation (HCC)    acetaminophen   1,000 mg Oral Q8H   Or   acetaminophen   650 mg Rectal Q8H   amiodarone   200 mg Oral Daily   ascorbic acid  500 mg Oral Daily   aspirin  EC  81 mg Oral Daily   atorvastatin   80 mg Oral Daily   Chlorhexidine  Gluconate Cloth  6 each Topical Daily   cyanocobalamin   1,000 mcg Intramuscular Q1200  Followed by   NOREEN ON 11/01/2023] vitamin B-12  1,000 mcg Oral Daily   folic acid  1 mg Oral Daily   heparin   5,000 Units Subcutaneous Q8H   iron polysaccharides  150 mg Oral Daily   lidocaine   1 patch Transdermal Q24H   lidocaine  1 %  10 mL Intradermal Once   lisinopril   20 mg Oral Daily   metoprolol  succinate  25 mg Oral Daily   sodium chloride  flush  3 mL Intravenous Q12H   tamsulosin   0.4 mg Oral QHS    SUBJECTIVE: Doing well. Hopeful to discharge home today    Review of Systems: Review of Systems  Constitutional:  Negative for chills and fever.  Gastrointestinal:  Negative for abdominal pain, nausea and vomiting.  Musculoskeletal:  Positive for back pain and joint pain.     No Known Allergies  OBJECTIVE: Vitals:   10/24/23 2013 10/25/23 0437 10/25/23 0808 10/25/23 0932  BP: (!) 159/80 (!) 148/67 (!) 159/67 (!) 156/81  Pulse: (!) 56 (!) 54 (!) 53 (!) 51  Resp: 18 18 17 17   Temp: 97.8 F (36.6 C) 97.6 F (36.4 C) 97.8 F (36.6 C) 97.8 F (36.6 C)  TempSrc: Oral Oral Oral Oral  SpO2: 97% 97% 98% 98%  Weight:      Height:       Body mass index is 24.91 kg/m.  Physical Exam Constitutional:      Appearance: He is not ill-appearing.  Cardiovascular:     Rate and Rhythm: Normal rate and regular rhythm.     Pulses: Normal pulses.     Heart sounds: Normal heart sounds.  Pulmonary:     Effort: Pulmonary effort is normal.     Breath sounds: Normal breath sounds.  Abdominal:     General: Bowel sounds are normal. There is no distension.  Musculoskeletal:     Comments: L shoulder in a sling  Skin:    General: Skin is warm and dry.  Neurological:     Mental Status: He is alert and oriented to person, place, and time.     Lab Results Lab Results  Component Value Date   WBC 6.6 10/25/2023   HGB 8.6 (L) 10/25/2023   HCT 28.4 (L) 10/25/2023   MCV 91.9 10/25/2023   PLT 326 10/25/2023    Lab Results  Component Value Date   CREATININE 1.03 10/25/2023   BUN 11 10/25/2023   NA 137 10/25/2023   K 3.9 10/25/2023   CL 101 10/25/2023   CO2 28 10/25/2023    Lab Results  Component Value Date   ALT 18 09/09/2023   AST 41 09/09/2023   ALKPHOS 70 09/09/2023   BILITOT 0.4 09/09/2023     Microbiology: Recent Results (from the past 240 hours)  Culture, blood (Routine X 2) w Reflex to ID Panel     Status: None   Collection Time: 10/19/23  9:40 PM   Specimen: BLOOD LEFT WRIST  Result Value Ref Range Status   Specimen Description BLOOD LEFT WRIST  Final   Special Requests   Final    BOTTLES DRAWN AEROBIC AND ANAEROBIC Blood Culture adequate volume   Culture   Final    NO GROWTH 5 DAYS Performed at [facility], [address]  [place], [place] [address]    Report Status 10/24/2023 FINAL  Final  Culture, blood (Routine X 2) w Reflex to ID Panel     Status: None   Collection Time: 10/19/23  9:50 PM   Specimen: BLOOD RIGHT FOREARM  Result Value Ref Range Status   Specimen Description BLOOD RIGHT FOREARM  Final   Special Requests   Final    BOTTLES DRAWN AEROBIC AND ANAEROBIC Blood Culture results may not be optimal due to an inadequate volume of blood received in culture bottles   Culture   Final    NO GROWTH 5 DAYS Performed at [facility], [address], [place], [place] [address]    Report Status 10/24/2023 FINAL  Final  Aerobic/Anaerobic Culture w Gram Stain (surgical/deep wound)     Status: None (Preliminary result)   Collection Time: 10/23/23  1:41 PM   Specimen: Path fluid; Tissue  Result Value Ref Range Status   Specimen Description FLUID  Final   Special Requests INTERVERTEBRAL DISC  Final   Gram Stain FEW WBC SEEN NO ORGANISMS SEEN   Final   Culture   Final    NO GROWTH 2 DAYS Performed at [facility], [address] [place], [place] [address]    Report Status PENDING  Incomplete     Corean Fireman, MSN, NP-C [facility]  [email or web address] Pager: [phone] Office: [phone] [facility] Main Line: [phone] *Secure Chat Communication Welcome  Total Encounter Time: 15 m

## 2023-10-26 ENCOUNTER — Other Ambulatory Visit (HOSPITAL_COMMUNITY): Payer: Self-pay

## 2023-10-26 DIAGNOSIS — Z452 Encounter for adjustment and management of vascular access device: Secondary | ICD-10-CM

## 2023-10-26 DIAGNOSIS — Z79899 Other long term (current) drug therapy: Secondary | ICD-10-CM

## 2023-10-26 DIAGNOSIS — M4624 Osteomyelitis of vertebra, thoracic region: Secondary | ICD-10-CM | POA: Diagnosis not present

## 2023-10-26 LAB — BASIC METABOLIC PANEL WITH GFR
Anion gap: 10 (ref 5–15)
BUN: 11 mg/dL (ref 8–23)
CO2: 28 mmol/L (ref 22–32)
Calcium: 8.4 mg/dL — ABNORMAL LOW (ref 8.9–10.3)
Chloride: 99 mmol/L (ref 98–111)
Creatinine, Ser: 1.08 mg/dL (ref 0.61–1.24)
GFR, Estimated: >60 mL/min (ref >60)
Glucose, Bld: 102 mg/dL — ABNORMAL HIGH (ref 70–99)
Potassium: 4 mmol/L (ref 3.5–5.1)
Sodium: 137 mmol/L (ref 135–145)

## 2023-10-26 LAB — CBC
HCT: 29 % — ABNORMAL LOW (ref 39.0–52.0)
Hemoglobin: 9 g/dL — ABNORMAL LOW (ref 13.0–17.0)
MCH: 28.5 pg (ref 26.0–34.0)
MCHC: 31 g/dL (ref 30.0–36.0)
MCV: 91.8 fL (ref 80.0–100.0)
Platelets: 336 K/uL (ref 150–400)
RBC: 3.16 MIL/uL — ABNORMAL LOW (ref 4.22–5.81)
RDW: 14 % (ref 11.5–15.5)
WBC: 8.3 K/uL (ref 4.0–10.5)
nRBC: 0 % (ref 0.0–0.2)

## 2023-10-26 LAB — MAGNESIUM: Magnesium: 2.2 mg/dL (ref 1.7–2.4)

## 2023-10-26 LAB — VITAMIN D 25 HYDROXY (VIT D DEFICIENCY, FRACTURES): Vit D, 25-Hydroxy: 17.75 ng/mL — ABNORMAL LOW (ref 30–100)

## 2023-10-26 LAB — PHOSPHORUS: Phosphorus: 3.5 mg/dL (ref 2.5–4.6)

## 2023-10-26 MED ORDER — VITAMIN D (ERGOCALCIFEROL) 1.25 MG (50000 UNIT) PO CAPS
50000.0000 [IU] | ORAL_CAPSULE | ORAL | 0 refills | Status: AC
  Filled 2023-10-26: qty 12, 84d supply, fill #0

## 2023-10-26 MED ORDER — POLYSACCHARIDE IRON COMPLEX 150 MG PO CAPS
150.0000 mg | ORAL_CAPSULE | Freq: Every day | ORAL | 2 refills | Status: AC
  Filled 2023-10-26: qty 30, 30d supply, fill #0

## 2023-10-26 MED ORDER — HEPARIN SOD (PORK) LOCK FLUSH 100 UNIT/ML IV SOLN
250.0000 [IU] | INTRAVENOUS | Status: AC | PRN
  Administered 2023-10-26: 250 [IU]

## 2023-10-26 MED ORDER — FOLIC ACID 1 MG PO TABS
1.0000 mg | ORAL_TABLET | Freq: Every day | ORAL | 2 refills | Status: AC
  Filled 2023-10-26: qty 30, 30d supply, fill #0

## 2023-10-26 MED ORDER — ACETAMINOPHEN 325 MG PO TABS
650.0000 mg | ORAL_TABLET | Freq: Three times a day (TID) | ORAL | 0 refills | Status: AC
  Filled 2023-10-26: qty 180, 30d supply, fill #0

## 2023-10-26 MED ORDER — OXYCODONE HCL 5 MG PO TABS
5.0000 mg | ORAL_TABLET | Freq: Three times a day (TID) | ORAL | 0 refills | Status: AC | PRN
  Filled 2023-10-26: qty 21, 4d supply, fill #0

## 2023-10-26 MED ORDER — CYANOCOBALAMIN 1000 MCG PO TABS
1000.0000 ug | ORAL_TABLET | Freq: Every day | ORAL | 2 refills | Status: AC
  Filled 2023-10-26: qty 30, 30d supply, fill #0

## 2023-10-26 MED ORDER — LISINOPRIL 20 MG PO TABS
20.0000 mg | ORAL_TABLET | Freq: Every day | ORAL | 11 refills | Status: AC
  Filled 2023-10-26: qty 30, 30d supply, fill #0

## 2023-10-26 MED ORDER — VITAMIN D (ERGOCALCIFEROL) 1.25 MG (50000 UNIT) PO CAPS
50000.0000 [IU] | ORAL_CAPSULE | ORAL | Status: DC
  Administered 2023-10-26: 50000 [IU] via ORAL
  Filled 2023-10-26: qty 1

## 2023-10-26 MED ORDER — ASCORBIC ACID 500 MG PO TABS
500.0000 mg | ORAL_TABLET | Freq: Every day | ORAL | 2 refills | Status: AC
  Filled 2023-10-26: qty 30, 30d supply, fill #0

## 2023-10-26 NOTE — Discharge Summary (Signed)
 [facility] Discharge Summary   Patient: Jacob [name] FMW:[id number]  PCP: [name], MD  Date of admission: 10/19/2023   Date of discharge:  10/26/2023     Discharge Diagnoses:  Principal Problem:   Acute osteomyelitis discitis of thoracic spine T11-T12 (HCC) Active Problems:   Empyema of right pleural space (HCC)   Coronary artery disease due to lipid rich plaque   Essential hypertension, benign   Paroxysmal atrial fibrillation (HCC)   PICC (peripherally inserted central catheter) flush   Medication management   Admitted From: Home Disposition:  Home   Recommendations for Outpatient Follow-up:  Follow-up with PCP in 1 week Follow-up with ID as per schedule, continue antibiotics for total 6 weeks duration, and date 12/01/2023 Follow-up with orthopedics surgery in 2 weeks Follow-up with pulmonary in 1 to 2 weeks Repeat labs as per ID recommendation and fax reports to the ID Repeat B12, folic acid, iron profile and vitamin D level after 3 to 6 months, need to be done by PCP. Follow up LABS/TEST:   Labs weekly while on IV antibiotics: _x_ CBC with differential __ BMP **TWICE WEEKLY ON VANCOMYCIN   _x_ CMP _x_ CRP _x_ ESR __ Vancomycin  trough TWICE WEEKLY __ CK  _x_ Please pull PIC at completion of IV antibiotics __ Please leave PIC in place until doctor has seen patient or been notified  Fax weekly labs to [phone] Clinic Follow Up Appt: 11/08/2023 @ 9:45 am with Dr. Luiz   Follow-up Information     Luiz Channel, MD Follow up on 11/08/2023.   Specialty: Infectious Diseases Why: 11/08/2023 @ 9:45 am with Dr. Luiz Pass information: [address] [place] [address] [phone]         Hamrick, Charlene CROME, MD Follow up in 1 week(s).   Specialty: Family Medicine Contact information: [address] [place] [address] [phone]         Dozier Soulier, MD Follow up in 2 week(s).   Specialty: Orthopedic  Surgery Contact information: [address] [place] [address] [phone]                Diet recommendation: Cardiac diet  Activity: The patient is advised to gradually reintroduce usual activities, as tolerated  Discharge Condition: stable  Code Status: Full code   History of present illness: As per the H and P dictated on admission.  Hospital Course:  Jacob [name] is a 73 y.o. male with medical history significant for CAD s/p CABG, CAS s/p right CEA, PAF not on AC, HTN, Crohn's disease, right MSSA empyema who is admitted with acute osteomyelitis discitis of T11-T12.  This was seen on outpatient MRI 9/11, read on 9/19 and referred to ED from Pulmonology follow up appointment due to worsening back and left shoulder pain in the setting of abnormal MRI findings.  Of note, pt had recent MSSA empyema during admission in August. See H&P for full HPI on admission & ED course.     Assessment and Plan:   # Acute osteomyelitis discitis at T11-12: Presumed due to MSSA given recent empyema.   ID consulted.  Neurosurgery was consulted in the ED, Seen by Dr. Louis - agrees with IV antibiotics, rec IR to aspirate epidural abscess for culture, no indication for surgical intervention Continue IV Ancef , s/p PICC line placed on 10/4 --IR aspirated T11-12 disc on 10/4 - cultures NGTD. Blood cultures - neg to date. --2D echo negative for hide abundant air  vegetations, but noticed mitral valve thickening.  No need of TEE as per ID, will need TTE after the treatment as an outpatient. TOC arranged home health services for IV antibiotics.  Patient with cleared by ID, recommended Cefazolin  2 gm IV TID  Duration: 6 weeks. End Date: 12/01/2023  Clinic Follow Up Appt: 11/08/2023 @ 9:45 am with Dr. Luiz  # History of right MSSA empyema: Admitted in August.  Has since completed course of oral antibiotics with Augmentin  as ID recommended that admission.  Imaging shows  improvement in pleural effusion, small residual fluid remains. Seen  in Pulmonology clinic follow up 9/30 --EDP discussed case with Dr. Claudene.  Empyema felt to be improving. --Outpatient follow up with pulmonology   # Left shoulder pain: Patient reports pain limiting range of motion of left shoulder.  Pain seems to primarily be at the [facility] joint area.  He denies any injury.  Concern is for possible septic arthritis.   MRI left shoulder shows small partial-thickness tear of the posterior supraspinatus tendon, diffuse subclavius muscular edema and mild enhancement which may reflect strain, inflammation or denervation edema... --Orthopedics consulted, recommended to follow-up in 2 to 3 weeks as an outpatient.  Continue as needed meds for pain control.  # Paroxysmal atrial fibrillation: NSR on admission. --Pt has been off anticoagulation due to recent acute blood loss anemia from hemothorax.  --Continue amiodarone  and Toprol -XL --Continue on aspirin  81 mg daily   # Acute on chronic anemia, normocytic Hx of recent ABLA due to hemothorax while on heparin  anticoagulation: Hbg slowly down-trending 9.6 >> 8.2 >> 9.0 --Hold off iron infusion w infection, continue oral iron supplement with vitamin C.   # Folic acid deficiency: Anemia panel showed folate level low at 5.1 --started folic acid supplement   # Iron deficiency, Tsat 16%, started oral iron supplement with vitamin C.  Follow with PCP to repeat iron profile after 3 to 6 months.   # Vitamin B12 level 222, goal >400, Started vitamin B12 1000 mcg IM injection daily during hospital stay, followed by oral supplement.  Follow-up PCP to repeat vitamin B12 level after 3 to 6 months.   # Vitamin D deficiency: started vitamin D 50,000 units p.o. weekly, follow with PCP to repeat vitamin D level after 3 to 6 months.  # CAD s/p CABG: Continue aspirin , Toprol -XL, atorvastatin . # Hypertension: Continue Toprol -XL, and lisinopril   # Crohn's disease:  Quiescent for many years, not on disease-specific therapy.  Body mass index is 24.91 kg/m.  Nutrition Interventions:   Pain control  - [place]  Controlled Substance Reporting System database could not be reviewed as website was not working.   Oxycodone  5 mg p.o. 3 times daily as needed prescribed for pain control. - Patient was instructed, not to drive, operate heavy machinery, perform activities at heights, swimming or participation in water  activities or provide baby sitting services while on Pain, Sleep and Anxiety Medications; until his outpatient Physician has advised to do so again.  - Also recommended to not to take more than prescribed Pain, Sleep and Anxiety Medications.  Patient was seen by physical therapy, who recommended Home health, which was arranged. On the day of the discharge the patient's vitals were stable, and no other acute medical condition were reported by patient. the patient was felt safe to be discharge at Home with Home health.  Consultants: Neurosurgery, orthopedic surgery, ID, IR Procedures:   Discharge Exam: General: Appear in no distress, Oral Mucosa Clear, moist. Cardiovascular: S1 and S2  Present, no Murmur, Respiratory: normal respiratory effort, Bilateral Air entry present and no Crackles, no wheezes Abdomen: Bowel Sound present, Soft and no tenderness. Extremities: no Pedal edema, no calf tenderness Neurology: alert and oriented to time, place, and person affect appropriate.  Filed Weights   10/20/23 1541  Weight: 81 kg   Vitals:   10/26/23 0600 10/26/23 0937  BP: (!) 166/76 129/65  Pulse: 68 62  Resp: 18 18  Temp: 98.1 F (36.7 C) 97.6 F (36.4 C)  SpO2: 93% 93%    DISCHARGE MEDICATION: Allergies as of 10/26/2023   No Known Allergies      Medication List     PAUSE taking these medications    amLODipine  5 MG tablet Wait to take this until your doctor or other care provider tells you to start again. Commonly known as:  NORVASC  Take 1 tablet (5 mg total) by mouth daily.       STOP taking these medications    HYDROcodone -acetaminophen  5-325 MG tablet Commonly known as: NORCO/VICODIN   oxyCODONE -acetaminophen  5-325 MG tablet Commonly known as: PERCOCET/ROXICET       TAKE these medications    acetaminophen  325 MG tablet Commonly known as: TYLENOL  Take 2 tablets (650 mg total) by mouth 3 (three) times daily.   amiodarone  200 MG tablet Commonly known as: PACERONE  Take 1 tablet (200 mg total) by mouth daily. What changed: how much to take   ascorbic acid 500 MG tablet Commonly known as: VITAMIN C Take 1 tablet (500 mg total) by mouth daily. Start taking on: October 27, 2023   aspirin  EC 81 MG tablet Take 81 mg by mouth daily. Swallow whole.   atorvastatin  80 MG tablet Commonly known as: LIPITOR Take 1 tablet (80 mg total) by mouth daily.   ceFAZolin  IVPB Commonly known as: ANCEF  Inject 2 g into the vein every 8 (eight) hours. Indication:  Discitis First Dose: Yes Last Day of Therapy:  12/01/23 Labs - Once weekly:  CBC/D and BMP, Labs - Once weekly: ESR and CRP Method of administration: IV Push Method of administration may be changed at the discretion of home infusion pharmacist based upon assessment of the patient and/or caregiver's ability to self-administer the medication ordered.   cyanocobalamin  1000 MCG tablet Take 1 tablet (1,000 mcg total) by mouth daily. Start taking on: November 01, 2023 What changed:  medication strength These instructions start on November 01, 2023. If you are unsure what to do until then, ask your doctor or other care provider.   folic acid 1 MG tablet Commonly known as: FOLVITE Take 1 tablet (1 mg total) by mouth daily. Start taking on: October 27, 2023   glucosamine-chondroitin 500-400 MG tablet Take 1 tablet by mouth 2 (two) times daily.   iron polysaccharides 150 MG capsule Commonly known as: NIFEREX Take 1 capsule (150 mg total) by mouth  daily. Start taking on: October 27, 2023   latanoprost  0.005 % ophthalmic solution Commonly known as: XALATAN  Place 1 drop into both eyes at bedtime.   lisinopril  20 MG tablet Commonly known as: ZESTRIL  Take 1 tablet (20 mg total) by mouth daily.   metoprolol  succinate 25 MG 24 hr tablet Commonly known as: TOPROL -XL What changed: See the new instructions.   oxyCODONE  5 MG immediate release tablet Commonly known as: Oxy IR/ROXICODONE  Take 1-2 tablets (5-10 mg total) by mouth every 8 (eight) hours as needed for up to 7 days for severe pain (pain score 7-10) or breakthrough pain.   Salonpas Pain  Relieving 4 % Generic drug: lidocaine  Place 1 patch onto the skin daily as needed (for shoulder and hip pain).   sildenafil 100 MG tablet Commonly known as: VIAGRA Take 100 mg by mouth daily as needed for erectile dysfunction.   tamsulosin  0.4 MG Caps capsule Commonly known as: FLOMAX  Take 0.4 mg by mouth at bedtime.   tiZANidine  4 MG tablet Commonly known as: Zanaflex  Take 1 tablet (4 mg total) by mouth 3 (three) times daily.   Vitamin D (Ergocalciferol) 1.25 MG (50000 UNIT) Caps capsule Commonly known as: DRISDOL Take 1 capsule (50,000 Units total) by mouth every 7 (seven) days. Start taking on: November 02, 2023               Discharge Care Instructions  (From admission, onward)           Start     Ordered   10/25/23 0000  Change dressing on IV access line weekly and PRN  (Home infusion instructions - [facility] )        10/25/23 1602           No Known Allergies Discharge Instructions     [facility] pharmacist to adjust dose for Vancomycin , Aminoglycosides and other anti-infective therapies as requested by physician.   Complete by: As directed    [facility] to provide Cath Flo 2mg    Complete by: As directed    Administer for PICC line occlusion and as ordered by physician for other access device issues.   Anaphylaxis  Kit: Provided to treat any anaphylactic reaction to the medication being provided to the patient if First Dose or when requested by physician   Complete by: As directed    Epinephrine  1mg /ml vial / amp: Administer 0.3mg  (0.53ml) subcutaneously once for moderate to severe anaphylaxis, nurse to call physician and pharmacy when reaction occurs and call 911 if needed for immediate care   Diphenhydramine  50mg /ml IV vial: Administer 25-50mg  IV/IM PRN for first dose reaction, rash, itching, mild reaction, nurse to call physician and pharmacy when reaction occurs   Sodium Chloride  0.9% NS 500ml IV: Administer if needed for hypovolemic blood pressure drop or as ordered by physician after call to physician with anaphylactic reaction   Call MD for:  difficulty breathing, headache or visual disturbances   Complete by: As directed    Call MD for:  extreme fatigue   Complete by: As directed    Call MD for:  persistant dizziness or light-headedness   Complete by: As directed    Call MD for:  persistant nausea and vomiting   Complete by: As directed    Call MD for:  redness, tenderness, or signs of infection (pain, swelling, redness, odor or green/yellow discharge around incision site)   Complete by: As directed    Call MD for:  severe uncontrolled pain   Complete by: As directed    Call MD for:  temperature >100.4   Complete by: As directed    Change dressing on IV access line weekly and PRN   Complete by: As directed    Diet - low sodium heart healthy   Complete by: As directed    Discharge instructions   Complete by: As directed    Follow-up with PCP in 1 week Follow-up with ID as per schedule, continue antibiotics for total 6 weeks duration, and date 12/01/2023 Follow-up with orthopedics surgery in 2 weeks Repeat labs as per ID recommendation and fax reports to the ID Repeat  B12, folic acid, iron profile and vitamin D level after 3 to 6 months, need to be done by PCP.   Flush IV access with Sodium  Chloride 0.9% and Heparin  10 units/ml or 100 units/ml   Complete by: As directed    Home infusion instructions - [facility]   Complete by: As directed    Instructions: Flush IV access with Sodium Chloride  0.9% and Heparin  10units/ml or 100units/ml   Change dressing on IV access line: Weekly and PRN   Instructions Cath Flo 2mg : Administer for PICC Line occlusion and as ordered by physician for other access device   [facility] pharmacist to adjust dose for: Vancomycin , Aminoglycosides and other anti-infective therapies as requested by physician   Increase activity slowly   Complete by: As directed    Method of administration may be changed at the discretion of home infusion pharmacist based upon assessment of the patient and/or caregiver's ability to self-administer the medication ordered   Complete by: As directed    No wound care   Complete by: As directed        The results of significant diagnostics from this hospitalization (including imaging, microbiology, ancillary and laboratory) are listed below for reference.    Significant Diagnostic Studies: CT GUIDED NEEDLE PLACEMENT Result Date: 10/26/2023 INDICATION: MRI findings suspicious for osteomyelitis or discitis at the T11-T12 interface. EXAM: Disc aspiration TECHNIQUE: Multidetector CT imaging of the thoracolumbar spine was performed following the standard protocol without IV contrast. RADIATION DOSE REDUCTION: This exam was performed according to the departmental dose-optimization program which includes automated exposure control, adjustment of the mA and/or kV according to patient size and/or use of iterative reconstruction technique. MEDICATIONS: The patient is currently admitted to the hospital and receiving intravenous antibiotics. The antibiotics were administered within an appropriate time frame prior to the initiation of the procedure. ANESTHESIA/SEDATION: Moderate (conscious) sedation was employed during  this procedure. A total of Versed  2 mg and Fentanyl  100 mcg was administered intravenously by the radiology nurse. Total intra-service moderate Sedation Time: 30 minutes. The patient's level of consciousness and vital signs were monitored continuously by radiology nursing throughout the procedure under my direct supervision. COMPLICATIONS: None immediate. PROCEDURE: Informed written consent was obtained from the patient after a thorough discussion of the procedural risks, benefits and alternatives. All questions were addressed. Maximal Sterile Barrier Technique was utilized including caps, mask, sterile gowns, sterile gloves, sterile drape, hand hygiene and skin antiseptic. A timeout was performed prior to the initiation of the procedure. In a prone position, radiopaque markers were placed on the patient's skin and axial images were obtained of the thoracolumbar spine. The anatomy was reviewed and compared to previous MRI of the thoracolumbar spine. Measurements were obtained. The patient's skin was then prepped, draped, and anesthetized in the usual sterile fashion. Local anesthesia was achieved with 1% lidocaine  by infiltrating subcutaneous tissue to the level of the disc space using a spinal needle. A spinal needle was then advanced from a small incision in the patient's right flank region and directed towards the T11-T12 interface. Intermittent axial images were obtained and the needle was redirected as necessary. Once the needle tip was at the disc space, the needle was advanced and aspiration was performed. Samples sent to lab for culture and sensitivity. IMPRESSION: Successful T11-T12 CT-guided disc aspiration. Electronically Signed   By: Cordella Banner   On: 10/26/2023 08:30   US  EKG SITE RITE Result Date: 10/22/2023 If Site Rite image not attached, placement could  not be confirmed due to current cardiac rhythm.  ECHOCARDIOGRAM COMPLETE Result Date: 10/21/2023    ECHOCARDIOGRAM REPORT   Patient Name:    PARDEEP PAUTZ Date of Exam: 10/21/2023 Medical Rec #:  [id number]         Height:       71.0 in Accession #:    [id number]        Weight:       178.6 lb Date of Birth:  [birth year]         BSA:          2.009 m Patient Age:    73 years          BP:           144/96 mmHg Patient Gender: M                 HR:           51 bpm. Exam Location:  Inpatient Procedure: 2D Echo, Cardiac Doppler and Color Doppler (Both Spectral and Color            Flow Doppler were utilized during procedure). Indications:    Endocarditis I38  History:        Patient has prior history of Echocardiogram examinations, most                 recent 09/03/2023. CAD, Prior CABG, Stroke; Risk                 Factors:Hypertension.  Sonographer:    Jayson Gaskins Referring Phys: [phone] STEPHANIE N DIXON IMPRESSIONS  1. Left ventricular ejection fraction, by estimation, is 50 to 55%. The left ventricle has low normal function. The left ventricle has no regional wall motion abnormalities. Left ventricular diastolic parameters are consistent with Grade I diastolic dysfunction (impaired relaxation).  2. Right ventricular systolic function is mildly reduced. The right ventricular size is normal.  3. The mitral valve is grossly normal. Trivial mitral valve regurgitation.  4. The aortic valve is tricuspid. There is mild calcification of the aortic valve. Aortic valve regurgitation is not visualized. Comparison(s): Prior images reviewed side by side. 09/02/2013: LVEF 55-60%. Compared to this prior study the mitral leaflets appear more thickened, but there is no obvious mobile vegetation. Conclusion(s)/Recommendation(s): No evidence of valvular vegetations on this transthoracic echocardiogram. Consider a transesophageal echocardiogram to exclude infective endocarditis if clinically indicated. FINDINGS  Left Ventricle: Left ventricular ejection fraction, by estimation, is 50 to 55%. The left ventricle has low normal function. The left ventricle has no regional  wall motion abnormalities. The left ventricular internal cavity size was normal in size. There is no left ventricular hypertrophy. Left ventricular diastolic parameters are consistent with Grade I diastolic dysfunction (impaired relaxation). Indeterminate filling pressures. Right Ventricle: The right ventricular size is normal. No increase in right ventricular wall thickness. Right ventricular systolic function is mildly reduced. Left Atrium: Left atrial size was normal in size. Right Atrium: Right atrial size was normal in size. Pericardium: There is no evidence of pericardial effusion. Mitral Valve: The mitral valve is grossly normal. There is mild thickening of the anterior and posterior mitral valve leaflet(s). Trivial mitral valve regurgitation. Tricuspid Valve: The tricuspid valve is grossly normal. Tricuspid valve regurgitation is trivial. Aortic Valve: The aortic valve is tricuspid. There is mild calcification of the aortic valve. Aortic valve regurgitation is not visualized. Aortic valve mean gradient measures 4.0 mmHg. Aortic valve peak gradient measures 7.2 mmHg. Aortic valve area, by VTI measures  2.76 cm. Pulmonic Valve: The pulmonic valve was not well visualized. Pulmonic valve regurgitation is not visualized. Aorta: The aortic root is normal in size and structure. Venous: The inferior vena cava was not well visualized. IAS/Shunts: No atrial level shunt detected by color flow Doppler.  LEFT VENTRICLE PLAX 2D LVIDd:         5.00 cm   Diastology LVIDs:         3.50 cm   LV e' medial:    8.70 cm/s LV PW:         1.00 cm   LV E/e' medial:  8.2 LV IVS:        0.70 cm   LV e' lateral:   5.87 cm/s LVOT diam:     1.90 cm   LV E/e' lateral: 12.1 LV SV:         63 LV SV Index:   31 LVOT Area:     2.84 cm  RIGHT VENTRICLE RV S prime:     7.18 cm/s TAPSE (M-mode): 1.8 cm LEFT ATRIUM           Index        RIGHT ATRIUM           Index LA Vol (A4C): 52.5 ml 26.13 ml/m  RA Area:     18.20 cm                                     RA Volume:   47.10 ml  23.44 ml/m  AORTIC VALVE AV Area (Vmax):    2.43 cm AV Area (Vmean):   2.71 cm AV Area (VTI):     2.76 cm AV Vmax:           134.00 cm/s AV Vmean:          89.900 cm/s AV VTI:            0.229 m AV Peak Grad:      7.2 mmHg AV Mean Grad:      4.0 mmHg LVOT Vmax:         115.00 cm/s LVOT Vmean:        86.000 cm/s LVOT VTI:          0.223 m LVOT/AV VTI ratio: 0.97  AORTA Ao Root diam: 2.90 cm MITRAL VALVE MV Area (PHT): 2.87 cm    SHUNTS MV Decel Time: 264 msec    Systemic VTI:  0.22 m MV E velocity: 71.30 cm/s  Systemic Diam: 1.90 cm MV A velocity: 83.30 cm/s MV E/A ratio:  0.86 Vinie Maxcy MD Electronically signed by Vinie Maxcy MD Signature Date/Time: 10/21/2023/2:30:48 PM    Final    MR Shoulder Left W Wo Contrast Result Date: 10/20/2023 EXAM: MRI OF THE LEFT SHOULDER WITHOUT AND WITH CONTRAST 10/19/2023 07:31:02 PM TECHNIQUE: Multiplanar multisequence MRI of the left shoulder was performed without and with the administration of 8 mL of gadobutrol (GADAVIST) 1 MMOL/ML injection. COMPARISON: Shoulder radiographs 10/19/2023. CLINICAL HISTORY: Osteomyelitis suspected, shoulder. X-ray done. 8mL gadavist administered. FINDINGS: ROTATOR CUFF: Small partial-thickness articular surface tear of the posterior supraspinatus tendon on image 15 series 6. Subtle edema inferiorly in the supraspinatus muscle on image 13 series 6 potentially from mild muscle strain. Intact infraspinatus, subscapularis and teres minor tendons. No significant muscle edema or atrophy in the infraspinatus, subscapularis, and teres minor. BICEPS TENDON: Long head biceps tendon is intact and normally located.  LABRUM: The glenoid labrum appears grossly intact. No paralabral cyst. GLENOHUMERAL JOINT: Physiologic amount of joint fluid. No high-grade cartilage loss. Normal alignment. AC JOINT AND ACROMIOCLAVICULAR ARCH: Mild degenerative arthropathy of the AC joint. Type 2 (curve) subacromial morphology. No  significant acromial downsloping or subacromial spur. Intact acromioclavicular and coracoclavicular ligaments. Trace edema along the coracoclavicular ligament without overt tear. BURSA: Trace subacromial subdeltoid bursitis without significant synovitis. BONE MARROW: No acute fracture or aggressive marrow replacing lesion. OUTLET SPACES: Normal MRI appearance of the quadrilateral space. No significant narrowing of the supraspinatus outlet. SOFT TISSUES: Diffuse edema and mild enhancement in the subclavius muscle may reflect a strain, inflammation, or denervation edema. The sternoclavicular joint was not included on today's exam, but if the patient has pain or tenderness over the sternoclavicular joint, additional imaging directed towards the sternoclavicular joint will be suggested. No focal abnormality of the subcutaneous soft tissues. IMPRESSION: 1. Small partial-thickness articular surface tear of the posterior supraspinatus tendon. 2. Diffuse subclavius muscular edema and mild enhancement, which may reflect strain, inflammation, or denervation edema. 3. Mild degenerative arthropathy of the [facility] joint with type 2 subacromial morphology. 4. Trace subacromial subdeltoid bursitis. 5. Trace edema along the coracoclavicular ligament without tear. 6. Subtle inferior supraspinatus muscular edema, suggestive of mild strain. Electronically signed by: Ryan Salvage MD 10/20/2023 09:10 AM EDT RP Workstation: [id number]   MR LUMBAR SPINE W WO CONTRAST Result Date: 10/19/2023 CLINICAL DATA:  Initial evaluation for acute osteomyelitis. EXAM: MRI LUMBAR SPINE WITHOUT AND WITH CONTRAST TECHNIQUE: Multiplanar and multiecho pulse sequences of the lumbar spine were obtained without and with intravenous contrast. CONTRAST:  8mL GADAVIST GADOBUTROL 1 MMOL/ML IV SOLN COMPARISON:  Prior MRI from 09/01/2023. FINDINGS: Segmentation: Transitional lumbosacral anatomy with lumbarization of the S1 segment and fairly well-formed S1-2  interspace is seen. Vertebral body count is made from the dens on this exam. Alignment: 3 mm retrolisthesis of L1 on L2, with trace degenerative retrolisthesis of L2 on L3 through L4-5. 3 mm anterolisthesis of L5 on S1. Findings chronic and degenerative. Vertebrae: Vertebral body height maintained without acute or chronic fracture. Changes of acute osteomyelitis discitis partially visualized at the level of T11-12, described on corresponding MRI of the thoracic spine. No other evidence for acute osteomyelitis discitis or septic arthritis within the lumbar spine. Bone marrow signal intensity within normal limits. No worrisome osseous lesions. No other abnormal marrow edema or enhancement. Conus medullaris and cauda equina: Conus extends to the L1 level. Conus and cauda equina appear normal. Paraspinal and other soft tissues: Diffuse edema present within the posterior paraspinous musculature of the lower back (series 6, images 17, 1). Findings are nonspecific, but could reflect changes of acute myositis. Possible muscular injury/strain could also be considered. No collections. Multiple T2 hyperintense cyst noted about the visualized kidneys, benign in appearance, no follow-up imaging recommended. Disc levels: L1-2: Retrolisthesis with diffuse disc bulge and disc desiccation. Superimposed small right subarticular disc protrusion. Mild facet hypertrophy. No significant spinal stenosis. Mild left L1 foraminal narrowing. Right neural foramen remains patent. L2-3: Diffuse disc bulge with disc desiccation. Reactive endplate spurring, greater on the left. Mild facet hypertrophy with trace joint effusions. No significant spinal stenosis. Mild left L2 foraminal narrowing. Right neural foramen remains patent. L3-4: Degenerate intervertebral disc space narrowing with disc desiccation diffuse disc bulge, asymmetric to the right. Reactive endplate spurring, also worse on the right. Mild to moderate bilateral facet hypertrophy.  Resultant mild narrowing of the right lateral recess. Central canal remains patent. Moderate right  L3 foraminal stenosis for the left neural foramen remains patent. L4-5: Degenerative disc space narrowing with disc desiccation diffuse disc bulge. Small right extraforaminal disc protrusion closely approximates the exiting right L4 nerve root. Mild facet and ligament flavum hypertrophy. No significant spinal stenosis. Mild bilateral L4 foraminal narrowing. L5-S1: Anterolisthesis. Disc desiccation with diffuse disc bulge. Moderate bilateral facet arthrosis. No significant spinal stenosis. Mild bilateral L5 foraminal narrowing. S1-2: Transitional lumbosacral anatomy with a well-formed S1-2 interspace. No disc bulge or focal disc herniation. Mild right worse than left facet arthrosis. No stenosis. IMPRESSION: 1. No MRI evidence for acute osteomyelitis discitis or septic arthritis within the lumbar spine. 2. Diffuse edema within the posterior paraspinous musculature of the lower back, nonspecific, but could reflect changes of acute myositis. Possible muscular injury/strain could also be considered. No collections. 3. Underlying multilevel degenerative spondylosis and facet arthrosis as above. No significant spinal stenosis. Mild to moderate bilateral L3 through L5 foraminal narrowing as above. 4. Changes of acute osteomyelitis discitis at T11-12, described on corresponding MRI of the thoracic spine. Electronically Signed   By: Morene Hoard M.D.   On: 10/19/2023 20:18   MR THORACIC SPINE W WO CONTRAST Result Date: 10/19/2023 CLINICAL DATA:  Initial evaluation for thoracic osteomyelitis. EXAM: MRI THORACIC WITHOUT AND WITH CONTRAST TECHNIQUE: Multiplanar and multiecho pulse sequences of the thoracic spine were obtained without and with intravenous contrast. CONTRAST:  8mL GADAVIST GADOBUTROL 1 MMOL/ML IV SOLN COMPARISON:  None Available. FINDINGS: Alignment: Exaggeration of the normal thoracic kyphosis. No  significant listhesis. Vertebrae: Findings consistent with acute osteomyelitis discitis at T11-12. Associated exuberant reactive marrow edema and enhancement. Endplate irregularity about the T11-12 interspace, with mild height loss about the T11 and T12 vertebral bodies. Mild paraspinous edema/inflammation but no loculated paraspinous collections. No significant epidural involvement at this time. No other evidence for distant infection elsewhere within the thoracic spine. Vertebral body height otherwise maintained. Bone marrow signal intensity within normal limits. No worrisome osseous lesions. Cord:  Normal signal and morphology. Paraspinal and other soft tissues: Mild paraspinous inflammation adjacent to the T11-12 interspace. Small right pleural effusion. Ovoid T1/T2 hyperintense lesion at the posterior right pleural space measures 3.8 x 2.6 cm (series 20, image 32), nonspecific, but possibly a loculated complex effusion. Few small T2 hyperintense left renal cyst noted, benign in appearance, no follow-up imaging recommended. Disc levels: T1-2:  Unremarkable. T2-3: Unremarkable. T3-4:  Small right paracentral disc protrusion.  No stenosis. T4-5:  Small central disc protrusion without stenosis. T5-6:  Small right paracentral disc protrusion without stenosis. T6-7:  Small central disc protrusion without significant stenosis. T7-8: Tiny left paracentral disc protrusion without significant stenosis. T8-9: Central disc protrusion indents the ventral thecal sac. Minimal cord flattening without cord signal changes or significant spinal stenosis. Mild right-sided facet hypertrophy. Foramina remain patent. T9-10: Mild disc bulge with facet hypertrophy.  No stenosis. T10-11: Negative interspace. Mild right greater left facet hypertrophy. No stenosis. T11-12: Findings consistent with osteomyelitis discitis. No significant epidural involvement. No spinal stenosis. Foramina remain grossly patent. T12-L1:  Negative interspace.   Mild facet hypertrophy.  No stenosis. IMPRESSION: 1. Findings consistent with acute osteomyelitis discitis at T11-12. No significant epidural involvement at this time. 2. Small right pleural effusion with 3.8 x 2.6 cm ovoid T1/T2 hyperintense lesion at the posterior right pleural space, nonspecific, but possibly a loculated complex effusion. Correlation with dedicated chest imaging suggested for further evaluation. 3. Underlying multilevel thoracic spondylosis as above without significant stenosis. Electronically Signed   By: Morene  Nicholes M.D.   On: 10/19/2023 19:58   DG Chest 2 View Result Date: 10/19/2023 CLINICAL DATA:  fluid buildup on lungs EXAM: CHEST - 2 VIEW COMPARISON:  10/07/23 FINDINGS: Low lung volumes. No new, airspace consolidation. Trace pleural effusion, unchanged. No pneumothorax. No cardiomegaly. CABG changes with sternotomy wires. No acute fracture or destructive lesion. IMPRESSION: Low lung volumes with a trace right pleural effusion. Otherwise, no acute cardiopulmonary abnormality. Electronically Signed   By: Rogelia Myers M.D.   On: 10/19/2023 12:51   DG Shoulder Left Result Date: 10/19/2023 CLINICAL DATA:  shoulder pain EXAM: LEFT SHOULDER - 2+ VIEW COMPARISON:  None Available. FINDINGS: No acute fracture or dislocation. There is no evidence of arthropathy or other focal bone abnormality. Soft tissues are unremarkable. Sternotomy wires and postsurgical changes of a prior CABG. IMPRESSION: No acute fracture or dislocation. Electronically Signed   By: Rogelia Myers M.D.   On: 10/19/2023 12:45   DG Chest 2 View Result Date: 10/07/2023 EXAM: 2 VIEW(S) XRAY OF THE CHEST 10/07/2023 09:23:06 AM COMPARISON: 09/21/2023 CLINICAL HISTORY: f/u pleural effusion. FINDINGS: LUNGS AND PLEURA: Right lower lobe volume loss and right midlung linear opacities, unchanged when accounting for differences in inspiratory volume. Small right pleural effusion, unchanged. Trace right apical  pneumothorax, unchanged. HEART AND MEDIASTINUM: Post median sternotomy and CABG. BONES AND SOFT TISSUES: No acute osseous abnormality. IMPRESSION: 1. Trace right apical pneumothorax, unchanged. 2. Small right pleural effusion, unchanged. 3. Right lower lobe volume loss and right midlung linear opacities, unchanged when accounting for differences in inspiratory volume. 4. Post median sternotomy and CABG. Electronically signed by: Waddell Calk MD 10/07/2023 01:50 PM EDT RP Workstation: [id number]    Microbiology: Recent Results (from the past 240 hours)  Culture, blood (Routine X 2) w Reflex to ID Panel     Status: None   Collection Time: 10/19/23  9:40 PM   Specimen: BLOOD LEFT WRIST  Result Value Ref Range Status   Specimen Description BLOOD LEFT WRIST  Final   Special Requests   Final    BOTTLES DRAWN AEROBIC AND ANAEROBIC Blood Culture adequate volume   Culture   Final    NO GROWTH 5 DAYS Performed at [facility], [address] [place], [place] [address]    Report Status 10/24/2023 FINAL  Final  Culture, blood (Routine X 2) w Reflex to ID Panel     Status: None   Collection Time: 10/19/23  9:50 PM   Specimen: BLOOD RIGHT FOREARM  Result Value Ref Range Status   Specimen Description BLOOD RIGHT FOREARM  Final   Special Requests   Final    BOTTLES DRAWN AEROBIC AND ANAEROBIC Blood Culture results may not be optimal due to an inadequate volume of blood received in culture bottles   Culture   Final    NO GROWTH 5 DAYS Performed at [facility], [address], [place], [place] [address]    Report Status 10/24/2023 FINAL  Final  Aerobic/Anaerobic Culture w Gram Stain (surgical/deep wound)     Status: None (Preliminary result)   Collection Time: 10/23/23  1:41 PM   Specimen: Path fluid; Tissue  Result Value Ref Range Status   Specimen Description FLUID  Final   Special Requests INTERVERTEBRAL DISC  Final   Gram Stain FEW WBC SEEN NO ORGANISMS SEEN   Final   Culture    Final    NO GROWTH 3 DAYS NO ANAEROBES ISOLATED; CULTURE IN PROGRESS FOR 5 DAYS Performed at  [facility], [address], [place], [place] [address]    Report Status PENDING  Incomplete     Labs: CBC: Recent Labs  Lab 10/21/23 0256 10/22/23 0303 10/23/23 0039 10/24/23 0435 10/25/23 0434 10/26/23 0148  WBC 6.7 7.5 7.5  --  6.6 8.3  HGB 8.8* 8.6* 7.8* 8.2* 8.6* 9.0*  HCT 28.0* 27.3* 24.9* 26.5* 28.4* 29.0*  MCV 90.0 90.4 91.9  --  91.9 91.8  PLT 293 311 299  --  326 336   Basic Metabolic Panel: Recent Labs  Lab 10/20/23 0615 10/21/23 0256 10/22/23 0303 10/25/23 0434 10/26/23 0148  NA 136 134* 136 137 137  K 3.9 3.5 3.8 3.9 4.0  CL 102 102 100 101 99  CO2 24 25 27 28 28   GLUCOSE 101* 104* 115* 94 102*  BUN 13 10 11 11 11   CREATININE 1.05 1.23 1.20 1.03 1.08  CALCIUM  8.5* 8.5* 8.7* 8.6* 8.4*  MG  --   --   --  2.1 2.2  PHOS  --   --   --  3.7 3.5   Liver Function Tests: No results for input(s): AST, ALT, ALKPHOS, BILITOT, PROT, ALBUMIN in the last 168 hours. No results for input(s): LIPASE, AMYLASE in the last 168 hours. No results for input(s): AMMONIA in the last 168 hours. Cardiac Enzymes: No results for input(s): CKTOTAL, CKMB, CKMBINDEX, TROPONINI in the last 168 hours. BNP (last 3 results) Recent Labs    08/31/23 2012  BNP 154.1*   CBG: No results for input(s): GLUCAP in the last 168 hours.  Time spent: 35 minutes  Signed:  Elvan Sor  [facility] 10/26/2023 12:30 PM

## 2023-10-26 NOTE — Plan of Care (Incomplete)
  Discharge antibiotics to be given via PICC line:   Cefazolin  2 gm IV TID      Duration: 6 weeks    End Date: 12/01/2023   [facility] Care Per Protocol with [employer] Use: Home health RN for IV administration and teaching, line care and labs.     Labs weekly while on IV antibiotics: _x_ CBC with differential __ BMP **TWICE WEEKLY ON VANCOMYCIN   _x_ CMP _x_ CRP _x_ ESR __ Vancomycin  trough TWICE WEEKLY __ CK   _x_ Please pull PIC at completion of IV antibiotics __ Please leave PIC in place until doctor has seen patient or been notified   Fax weekly labs to [phone]   Clinic Follow Up Appt: 11/08/2023 @ 9:45 am with Dr. Luiz

## 2023-10-26 NOTE — Plan of Care (Signed)
 AVS instructed and understood with no questions asked. Meds handed to pt. PICC on place and pt will go home with a PICC line. Problem: Education: Goal: Knowledge of General Education information will improve Description: Including pain rating scale, medication(s)/side effects and non-pharmacologic comfort measures Outcome: Progressing   Problem: Health Behavior/Discharge Planning: Goal: Ability to manage health-related needs will improve Outcome: Progressing   Problem: Clinical Measurements: Goal: Ability to maintain clinical measurements within normal limits will improve Outcome: Progressing Goal: Will remain free from infection Outcome: Progressing Goal: Diagnostic test results will improve Outcome: Progressing Goal: Respiratory complications will improve Outcome: Progressing Goal: Cardiovascular complication will be avoided Outcome: Progressing   Problem: Activity: Goal: Risk for activity intolerance will decrease Outcome: Progressing   Problem: Nutrition: Goal: Adequate nutrition will be maintained Outcome: Progressing   Problem: Coping: Goal: Level of anxiety will decrease Outcome: Progressing   Problem: Elimination: Goal: Will not experience complications related to bowel motility Outcome: Progressing Goal: Will not experience complications related to urinary retention Outcome: Progressing   Problem: Pain Managment: Goal: General experience of comfort will improve and/or be controlled Outcome: Progressing   Problem: Safety: Goal: Ability to remain free from injury will improve Outcome: Progressing   Problem: Skin Integrity: Goal: Risk for impaired skin integrity will decrease Outcome: Progressing

## 2023-10-26 NOTE — TOC Progression Note (Signed)
 Transition of Care (TOC) - Progression Note   Pam with [facility] company is ready for patient discharge today. He can DC anytime , they will deliver medications to home for the afternoon dose (1600)    Kelly with [facility] health will make sure he is on schedule for tomorrow .   Patient, wife, aware. Secure chatted MD and bedside nurse  Patient Details  Name: Jacob [name] MRN: [id number] Date of Birth: Aug 03, 1950  Transition of Care [facility]) CM/SW Contact  Khalin Royce, Powell Jansky, RN Phone Number: 10/26/2023, 10:01 AM  Clinical Narrative:                         Expected Discharge Plan and Services                                               Social Drivers of Health (SDOH) Interventions SDOH Screenings   Food Insecurity: Food Insecurity Present (10/20/2023)  Housing: Low Risk  (10/20/2023)  Transportation Needs: No Transportation Needs (10/20/2023)  Utilities: Not At Risk (10/20/2023)  Social Connections: Socially Integrated (10/20/2023)  Tobacco Use: Medium Risk (10/19/2023)    Readmission Risk Interventions     No data to display

## 2023-10-28 LAB — AEROBIC/ANAEROBIC CULTURE W GRAM STAIN (SURGICAL/DEEP WOUND): Culture: NO GROWTH

## 2023-10-29 ENCOUNTER — Telehealth: Payer: Self-pay

## 2023-10-29 NOTE — Telephone Encounter (Signed)
 Received voicemail from [place] with [facility] requesting orders for weekly nursing care for PICC dressing change and labs. Called Nena back and relayed verbal orders per OPAT note by Corean Fireman, NP.   Sandra [phone]  Genifer Lazenby, BSN, RN

## 2023-11-02 NOTE — Addendum Note (Signed)
 Encounter addended by: Franchot Glade RAMAN, RN on: 11/02/2023 9:23 AM  Actions taken: Imaging Exam ended

## 2023-11-05 ENCOUNTER — Other Ambulatory Visit (HOSPITAL_COMMUNITY): Payer: Self-pay | Admitting: *Deleted

## 2023-11-05 ENCOUNTER — Ambulatory Visit (HOSPITAL_COMMUNITY): Payer: Self-pay | Admitting: Internal Medicine

## 2023-11-05 DIAGNOSIS — I4891 Unspecified atrial fibrillation: Secondary | ICD-10-CM

## 2023-11-08 ENCOUNTER — Telehealth: Payer: Self-pay

## 2023-11-08 ENCOUNTER — Encounter: Payer: Self-pay | Admitting: Internal Medicine

## 2023-11-08 ENCOUNTER — Other Ambulatory Visit: Payer: Self-pay

## 2023-11-08 ENCOUNTER — Ambulatory Visit: Admitting: Internal Medicine

## 2023-11-08 VITALS — BP 159/76 | HR 60 | Temp 98.4°F | Resp 16 | Wt 186.0 lb

## 2023-11-08 DIAGNOSIS — R600 Localized edema: Secondary | ICD-10-CM

## 2023-11-08 DIAGNOSIS — Z23 Encounter for immunization: Secondary | ICD-10-CM

## 2023-11-08 DIAGNOSIS — I10 Essential (primary) hypertension: Secondary | ICD-10-CM

## 2023-11-08 DIAGNOSIS — M549 Dorsalgia, unspecified: Secondary | ICD-10-CM | POA: Diagnosis not present

## 2023-11-08 DIAGNOSIS — M4624 Osteomyelitis of vertebra, thoracic region: Secondary | ICD-10-CM

## 2023-11-08 MED ORDER — TIZANIDINE HCL 4 MG PO TABS
4.0000 mg | ORAL_TABLET | Freq: Three times a day (TID) | ORAL | 3 refills | Status: AC
Start: 2023-11-08 — End: ?

## 2023-11-08 MED ORDER — CEFADROXIL 500 MG PO CAPS: 500.0000 mg | ORAL_CAPSULE | Freq: Two times a day (BID) | ORAL | 2 refills | Status: DC

## 2023-11-08 NOTE — Telephone Encounter (Signed)
 Per Dr.Snider - stop IV abx and pull PICC line on 12/01/2023. Message sent to [facility].  Husam Hohn SHAUNNA Letters, CMA

## 2023-11-08 NOTE — Progress Notes (Signed)
 RFV: follow up for MSSA discitis  Patient ID: Jacob [name], male   DOB: [birth year], 73 y.o.   MRN: [id number]  HPI Jacob [name] is a 73yo M with previous hx of treated for empyema and now recently hospitalized for new T11-T12 discitis. In the setting of elevated inflammatory markers of CRP 14.5, and sed rate of 95. He had aspirate and blood cx ngtd. Presumably it is MSSA due to recent empyema in Sept. He was discharged on cefazolin  x 6 wk through 12/01/2023.  He has about the same amount of back pain. Using walker at home to keep stable. And sometimes cane.   Sees dr poole - this week  Outpatient Encounter Medications as of 11/08/2023  Medication Sig   acetaminophen  (TYLENOL ) 325 MG tablet Take 2 tablets (650 mg total) by mouth 3 (three) times daily.   amiodarone  (PACERONE ) 200 MG tablet Take 1 tablet (200 mg total) by mouth daily. (Patient taking differently: Take 100 mg by mouth daily.)   ascorbic acid (VITAMIN C) 500 MG tablet Take 1 tablet (500 mg total) by mouth daily.   aspirin  EC 81 MG tablet Take 81 mg by mouth daily. Swallow whole.   atorvastatin  (LIPITOR) 80 MG tablet Take 1 tablet (80 mg total) by mouth daily.   ceFAZolin  (ANCEF ) IVPB Inject 2 g into the vein every 8 (eight) hours. Indication:  Discitis First Dose: Yes Last Day of Therapy:  12/01/23 Labs - Once weekly:  CBC/D and BMP, Labs - Once weekly: ESR and CRP Method of administration: IV Push Method of administration may be changed at the discretion of home infusion pharmacist based upon assessment of the patient and/or caregiver's ability to self-administer the medication ordered.   cyanocobalamin  1000 MCG tablet Take 1 tablet (1,000 mcg total) by mouth daily.   folic acid (FOLVITE) 1 MG tablet Take 1 tablet (1 mg total) by mouth daily.   glucosamine-chondroitin 500-400 MG tablet Take 1 tablet by mouth 2 (two) times daily.   iron polysaccharides (NIFEREX) 150 MG capsule Take 1 capsule (150 mg total) by mouth daily.    latanoprost  (XALATAN ) 0.005 % ophthalmic solution Place 1 drop into both eyes at bedtime.   lidocaine  (SALONPAS PAIN RELIEVING) 4 % Place 1 patch onto the skin daily as needed (for shoulder and hip pain).   lisinopril  (ZESTRIL ) 20 MG tablet Take 1 tablet (20 mg total) by mouth daily.   metoprolol  succinate (TOPROL -XL) 25 MG 24 hr tablet  (Patient taking differently: Take 25 mg by mouth daily.)   sildenafil (VIAGRA) 100 MG tablet Take 100 mg by mouth daily as needed for erectile dysfunction.   tamsulosin  (FLOMAX ) 0.4 MG CAPS capsule Take 0.4 mg by mouth at bedtime.   tiZANidine  (ZANAFLEX ) 4 MG tablet Take 1 tablet (4 mg total) by mouth 3 (three) times daily.   Vitamin D, Ergocalciferol, (DRISDOL) 1.25 MG (50000 UNIT) CAPS capsule Take 1 capsule (50,000 Units total) by mouth every 7 (seven) days.   [Paused] amLODipine  (NORVASC ) 5 MG tablet Take 1 tablet (5 mg total) by mouth daily. (Patient not taking: Reported on 10/20/2023)   No facility-administered encounter medications on file as of 11/08/2023.     Patient Active Problem List   Diagnosis Date Noted   PICC (peripherally inserted central catheter) flush 10/26/2023   Medication management 10/26/2023   Empyema of right pleural space (HCC) 10/19/2023   Acute osteomyelitis discitis of thoracic spine T11-T12 (HCC) 10/19/2023   Paroxysmal atrial fibrillation (HCC) 10/19/2023   Dizziness 09/01/2023  Frequent falls 09/01/2023   Acute low back pain 09/01/2023   Acute on chronic back pain 08/31/2023   Abdominal pain 08/31/2023   Acute focal neurological deficit 01/14/2022   Crohn's colitis (HCC) 03/10/2018   Scrotal pain 12/07/2017   Carotid artery stenosis    Confusion    TIA (transient ischemic attack) 06/16/2016   Coronary artery disease due to lipid rich plaque 09/14/2013   Pure hypercholesterolemia 09/14/2013   Essential hypertension, benign 09/14/2013   Bradycardia, sinus 09/14/2013     Health Maintenance Due  Topic Date Due    COVID-19 Vaccine (1) Never done   DTaP/Tdap/Td (1 - Tdap) Never done   Zoster Vaccines- Shingrix (1 of 2) Never done   Colonoscopy  03/24/2020   Influenza Vaccine  08/20/2023   Medicare Annual Wellness (AWV)  10/29/2023     Review of Systems 12 point ros is otherwise negative Physical Exam   BP (!) 159/76   Pulse 60   Temp 98.4 F (36.9 C) (Oral)   Resp 16   Wt 186 lb (84.4 kg)   SpO2 95%   BMI 25.94 kg/m   Physical Exam  Constitutional: He is oriented to person, place, and time. He appears well-developed and well-nourished. No distress.  HENT:  Mouth/Throat: Oropharynx is clear and moist. No oropharyngeal exudate.  Cardiovascular: Normal rate, regular rhythm and normal heart sounds. Exam reveals no gallop and no friction rub.  No murmur heard.  Pulmonary/Chest: Effort normal and breath sounds normal. No respiratory distress. He has no wheezes.  Abdominal: Soft. Bowel sounds are normal. He exhibits no distension. There is no tenderness.  Lymphadenopathy:  He has no cervical adenopathy.  Neurological: He is alert and oriented to person, place, and time.  Skin: Skin is warm and dry. No rash noted. No erythema.  Psychiatric: He has a normal mood and affect. His behavior is normal.    CBC Lab Results  Component Value Date   WBC 8.3 10/26/2023   RBC 3.16 (L) 10/26/2023   HGB 9.0 (L) 10/26/2023   HCT 29.0 (L) 10/26/2023   PLT 336 10/26/2023   MCV 91.8 10/26/2023   MCH 28.5 10/26/2023   MCHC 31.0 10/26/2023   RDW 14.0 10/26/2023   LYMPHSABS 1.1 10/19/2023   MONOABS 0.6 10/19/2023   EOSABS 0.0 10/19/2023    BMET Lab Results  Component Value Date   NA 137 10/26/2023   K 4.0 10/26/2023   CL 99 10/26/2023   CO2 28 10/26/2023   GLUCOSE 102 (H) 10/26/2023   BUN 11 10/26/2023   CREATININE 1.08 10/26/2023   CALCIUM  8.4 (L) 10/26/2023   GFRNONAA >60 10/26/2023   GFRAA 66 06/07/2019      Assessment and Plan Probable MSSA thoracic discitis = Follow up with blood  work today from home health. Continue with cefazolin  2gm  Iv Q8hr through nov 12th ---- his 50th anniversary Start  cefadroxil November 13th for additional 4 wk and reassess if needs to continue further  Hypertension = suspect it is elevated due to pain. If still elevated next visit, will consider to increase lisinopril  dose  Back MSK pain = Refill on zanaflex   Pedal edema - recommend to wear compression socks  Flu and covid vaccine today  Rtc 3-4-wk

## 2023-11-12 ENCOUNTER — Emergency Department (HOSPITAL_COMMUNITY)
Admission: EM | Admit: 2023-11-12 | Discharge: 2023-11-12 | Disposition: A | Discharge status: Home / Self Care | Attending: Emergency Medicine | Admitting: Emergency Medicine

## 2023-11-12 ENCOUNTER — Encounter (HOSPITAL_COMMUNITY): Payer: Self-pay

## 2023-11-12 ENCOUNTER — Other Ambulatory Visit: Payer: Self-pay

## 2023-11-12 DIAGNOSIS — I2510 Atherosclerotic heart disease of native coronary artery without angina pectoris: Secondary | ICD-10-CM | POA: Insufficient documentation

## 2023-11-12 DIAGNOSIS — T82838A Hemorrhage of vascular prosthetic devices, implants and grafts, initial encounter: Secondary | ICD-10-CM | POA: Diagnosis present

## 2023-11-12 DIAGNOSIS — Z8673 Personal history of transient ischemic attack (TIA), and cerebral infarction without residual deficits: Secondary | ICD-10-CM | POA: Insufficient documentation

## 2023-11-12 DIAGNOSIS — I10 Essential (primary) hypertension: Secondary | ICD-10-CM | POA: Insufficient documentation

## 2023-11-12 DIAGNOSIS — Z79899 Other long term (current) drug therapy: Secondary | ICD-10-CM | POA: Diagnosis not present

## 2023-11-12 DIAGNOSIS — Z7982 Long term (current) use of aspirin: Secondary | ICD-10-CM | POA: Diagnosis not present

## 2023-11-12 DIAGNOSIS — Y831 Surgical operation with implant of artificial internal device as the cause of abnormal reaction of the patient, or of later complication, without mention of misadventure at the time of the procedure: Secondary | ICD-10-CM | POA: Insufficient documentation

## 2023-11-12 NOTE — Progress Notes (Signed)
 Peripherally Inserted Central Catheter Placement  The IV Nurse has discussed with the patient and/or persons authorized to consent for the patient, the purpose of this procedure and the potential benefits and risks involved with this procedure.  The benefits include less needle sticks, lab draws from the catheter, and the patient may be discharged home with the catheter. Risks include, but not limited to, infection, bleeding, blood clot (thrombus formation), and puncture of an artery; nerve damage and irregular heartbeat and possibility to perform a PICC exchange if needed/ordered by physician.  Alternatives to this procedure were also discussed.  Bard Power PICC patient education guide, fact sheet on infection prevention and patient information card has been provided to patient /or left at bedside.    PICC Placement Documentation  PICC Single Lumen 11/12/23 Right Brachial 39 cm 0 cm (Active)  Indication for Insertion or Continuance of Line Home intravenous therapies (PICC only) 11/12/23 1900  Exposed Catheter (cm) 0 cm 11/12/23 1900  Site Assessment Clean, Dry, Intact 11/12/23 1900  Line Status Flushed;Saline locked;Blood return noted 11/12/23 1900  Dressing Type Transparent;Securing device 11/12/23 1900  Dressing Status Antimicrobial disc/dressing in place;Clean, Dry, Intact 11/12/23 1900  Line Care Connections checked and tightened 11/12/23 1900  Line Adjustment (NICU/IV Team Only) No 11/12/23 1900  Dressing Intervention New dressing;Adhesive placed at insertion site (IV team only) 11/12/23 1900  Dressing Change Due 11/19/23 11/12/23 1900       Ethyl Adonna Fuller 11/12/2023, 7:33 PM

## 2023-11-12 NOTE — ED Triage Notes (Signed)
 First Note: Pt has a PICC line in the R arm and he noticed bleeding around the site today.

## 2023-11-12 NOTE — ED Provider Notes (Signed)
 [place] EMERGENCY DEPARTMENT AT [facility] Provider Note   CSN: [id number] Arrival date & time: 11/12/23  1626     Patient presents with: Vascular Access Problem   Jacob [name] is a 73 y.o. male who presents emergency department with a chief complaint of PICC line complication.  Patient has a PICC line placed in his right arm and noticed bleeding around the site today.  Patient is receiving outpatient IV antibiotics and being followed by infectious disease due to diagnosis of acute osteomyelitis/discitis of thoracic spine T11-T12 from 930/2025 admission to the hospital.  Patient states he has been compliant with his outpatient antibiotics and following up with specialist as well.  States his only reason for coming to the emergency department today was due to bleeding from his PICC line and denies any other complaints.  Past medical history significant for coronary artery disease, hypertension, TIA, bradycardia, acute osteomyelitis discitis of thoracic spine T11-T12, paroxysmal A-fib, PICC line placement, etc.   HPI     Prior to Admission medications   Medication Sig Start Date End Date Taking? Authorizing Provider  acetaminophen  (TYLENOL ) 325 MG tablet Take 2 tablets (650 mg total) by mouth 3 (three) times daily. 10/26/23 11/25/23  Von Bellis, MD  amiodarone  (PACERONE ) 200 MG tablet Take 1 tablet (200 mg total) by mouth daily. Patient taking differently: Take 100 mg by mouth daily. 10/11/23   Terra Fairy PARAS, PA-C  amLODipine  (NORVASC ) 5 MG tablet Take 1 tablet (5 mg total) by mouth daily. Patient not taking: Reported on 10/20/2023 11/05/22   Lucien Orren SAILOR, PA-C  ascorbic acid (VITAMIN C) 500 MG tablet Take 1 tablet (500 mg total) by mouth daily. 10/27/23 01/25/24  Von Bellis, MD  aspirin  EC 81 MG tablet Take 81 mg by mouth daily. Swallow whole.    [provider]  atorvastatin  (LIPITOR) 80 MG tablet Take 1 tablet (80 mg total) by mouth daily. 05/24/23   Shlomo Wilbert SAUNDERS, MD  cefadroxil (DURICEF) 500 MG capsule Take 1 capsule (500 mg total) by mouth 2 (two) times daily. Start taking on nov 13th 11/08/23   Luiz Channel, MD  ceFAZolin  (ANCEF ) IVPB Inject 2 g into the vein every 8 (eight) hours. Indication:  Discitis First Dose: Yes Last Day of Therapy:  12/01/23 Labs - Once weekly:  CBC/D and BMP, Labs - Once weekly: ESR and CRP Method of administration: IV Push Method of administration may be changed at the discretion of home infusion pharmacist based upon assessment of the patient and/or caregiver's ability to self-administer the medication ordered. 10/25/23 12/01/23  Melvenia Corean SAILOR, NP  cyanocobalamin  1000 MCG tablet Take 1 tablet (1,000 mcg total) by mouth daily. 11/01/23 01/30/24  Von Bellis, MD  folic acid (FOLVITE) 1 MG tablet Take 1 tablet (1 mg total) by mouth daily. 10/27/23 01/25/24  Von Bellis, MD  glucosamine-chondroitin 500-400 MG tablet Take 1 tablet by mouth 2 (two) times daily.    [provider]  iron polysaccharides (NIFEREX) 150 MG capsule Take 1 capsule (150 mg total) by mouth daily. 10/27/23 01/25/24  Von Bellis, MD  latanoprost  (XALATAN ) 0.005 % ophthalmic solution Place 1 drop into both eyes at bedtime. 07/19/23   [provider]  lidocaine  (SALONPAS PAIN RELIEVING) 4 % Place 1 patch onto the skin daily as needed (for shoulder and hip pain).    [provider]  lisinopril  (ZESTRIL ) 20 MG tablet Take 1 tablet (20 mg total) by mouth daily. 10/26/23 10/25/24  Von Bellis,  MD  metoprolol  succinate (TOPROL -XL) 25 MG 24 hr tablet     [provider]  sildenafil (VIAGRA) 100 MG tablet Take 100 mg by mouth daily as needed for erectile dysfunction. 12/13/21   [provider]  tamsulosin  (FLOMAX ) 0.4 MG CAPS capsule Take 0.4 mg by mouth at bedtime. 07/04/21   [provider]  tiZANidine  (ZANAFLEX ) 4 MG tablet Take 1 tablet (4 mg total) by mouth 3 (three) times daily. 11/08/23   Luiz Channel, MD  Vitamin D, Ergocalciferol, (DRISDOL) 1.25 MG (50000 UNIT) CAPS capsule Take 1 capsule (50,000 Units total) by mouth every 7 (seven) days. 11/02/23 01/31/24  Von Bellis, MD    Allergies: Patient has no known allergies.    Review of Systems  Skin:        PICC line in place in right arm, bleeding under bandage controlled    Updated Vital Signs BP (!) 175/89   Pulse (!) 58   Temp 98.9 F (37.2 C) (Oral)   Resp 18   SpO2 100%   Physical Exam Vitals and nursing note reviewed.  Constitutional:      General: He is awake. He is not in acute distress.    Appearance: Normal appearance. He is not ill-appearing, toxic-appearing or diaphoretic.  Eyes:     General: No scleral icterus. Pulmonary:     Effort: Pulmonary effort is normal. No respiratory distress.  Musculoskeletal:        General: Normal range of motion.     Comments: Grossly normal range of motion of all 4 extremities  Skin:    General: Skin is warm.     Capillary Refill: Capillary refill takes less than 2 seconds.     Comments: PICC line in place to right arm, Tegaderm in place with bleeding present, bleeding controlled  Neurological:     General: No focal deficit present.     Mental Status: He is alert and oriented to person, place, and time.  Psychiatric:        Mood and Affect: Mood normal.        Behavior: Behavior normal. Behavior is cooperative.     (all labs ordered are listed, but only abnormal results are displayed) Labs Reviewed - No data to display  EKG: None  Radiology: US  EKG Site Rite Result Date: 11/12/2023 If Site Rite image not attached, placement could not be confirmed due to current cardiac rhythm.    Procedures   Medications Ordered in the ED - No data to display                                  Medical Decision Making  Patient presents to the ED for concern of bleeding from PICC line, this involves an extensive number of treatment options, and is a complaint that  carries with it a high risk of complications and morbidity.  The differential diagnosis includes PICC line complication, PICC line infection, cellulitis, etc.   Co morbidities that complicate the patient evaluation  coronary artery disease, hypertension, TIA, bradycardia, acute osteomyelitis discitis of thoracic spine T11-T12, paroxysmal A-fib, PICC line placement   Additional history obtained:  Reviewed chart which shows patient is currently following with infectious disease and is being treated for osteomyelitis of the thoracic spine   Medicines ordered and prescription drug management:  I have reviewed the patients home medicines and have made adjustments as needed   Test Considered:  None  Critical Interventions:  None   Consultations Obtained:  I requested consultation with the PICC line team,  and discussed lab and imaging findings as well as pertinent plan - they recommend: Changing out PICC line   Problem List / ED Course:  73 year old male, vital signs stable, presents emergency department with a chief complaint of bleeding from PICC line that is present to his right arm, denies any other complaints, currently being treated for osteomyelitis/discitis of thoracic spine palpation and has been following with infectious disease, states he has been compliant with antibiotic therapy On physical exam PICC line in place to right arm with Tegaderm overlapping, blood present under Tegaderm bleeding controlled Consulted IV/PICC line team who recommended replacement of line due to current condition PICC line replaced by IV/PICC line team Floria Angle RN) in triage Patient reevaluated by myself, new PICC line in place, patient asymptomatic this time, no bleeding Return precautions given Patient discharged Most likely diagnosis at this time is bleeding from PICC line site but improved after PICC exchange   Reevaluation:  After the interventions noted above, I reevaluated  the patient and found that they have :improved   Social Determinants of Health:  None   Dispostion:  After consideration of the diagnostic results and the patients response to treatment, I feel that the patient would benefit from discharge and outpatient therapy as described, follow-up with infectious disease and other specialist as well as your primary care provider, please make them aware of your visit today as well as all findings.       Final diagnoses:  Bleeding from peripherally inserted central catheter (PICC), initial encounter    ED Discharge Orders     None          Janetta Terrall FALCON, PA-C 11/12/23 2240    Freddi Hamilton, MD 11/18/23 [phone]

## 2023-11-12 NOTE — Discharge Instructions (Addendum)
 It was a pleasure taking care of you today.  Based on your history and physical exam I feel you are stable for discharge.  Today in triage your PICC line was replaced by the PICC line team due to bleeding, this procedure was performed successfully and you now have a new PICC line.  Please continue your outpatient treatments as previously prescribed and follow-up with your primary care provider as well as specialist as scheduled, sooner if symptoms warrant.  If you experience any of the following symptoms including but not limited to severe arm pain, more bleeding from your PICC line, signs of infection, fever, chills, chest pain, shortness of breath, or other concerning symptom please return to the emergency department or seek further medical care. If symptoms persist or worsen recommend follow-up within 48 hours.

## 2023-11-29 LAB — LAB REPORT - SCANNED: EGFR: 91

## 2023-12-08 ENCOUNTER — Ambulatory Visit: Admitting: Internal Medicine

## 2023-12-08 ENCOUNTER — Other Ambulatory Visit: Payer: Self-pay

## 2023-12-08 VITALS — BP 158/85 | HR 68 | Temp 98.5°F | Wt 176.0 lb

## 2023-12-08 DIAGNOSIS — Z79899 Other long term (current) drug therapy: Secondary | ICD-10-CM | POA: Diagnosis not present

## 2023-12-08 DIAGNOSIS — M4624 Osteomyelitis of vertebra, thoracic region: Secondary | ICD-10-CM

## 2023-12-08 DIAGNOSIS — I10 Essential (primary) hypertension: Secondary | ICD-10-CM

## 2023-12-08 DIAGNOSIS — B9561 Methicillin susceptible Staphylococcus aureus infection as the cause of diseases classified elsewhere: Secondary | ICD-10-CM | POA: Diagnosis not present

## 2023-12-08 MED ORDER — CEFADROXIL 500 MG PO CAPS: 1000.0000 mg | ORAL_CAPSULE | Freq: Two times a day (BID) | ORAL | 2 refills | Status: AC

## 2023-12-08 NOTE — Progress Notes (Signed)
 RFV: follow up for hospitalization - MSSA discitis  Patient ID: Jacob [name], male   DOB: 1951-01-14, 73 y.o.   MRN: [id number]  HPI 73yo M with MSSA discitis, on iv through 11/12 and now on cefadroxil  Still having pain in back and left side - sees dr poole on 12/18  He notices getting postural dizziness. Improves after a minute of being still   Outpatient Encounter Medications as of 12/08/2023  Medication Sig   amiodarone  (PACERONE ) 200 MG tablet Take 1 tablet (200 mg total) by mouth daily. (Patient taking differently: Take 100 mg by mouth daily.)   [Paused] amLODipine  (NORVASC ) 5 MG tablet Take 1 tablet (5 mg total) by mouth daily.   ascorbic acid  (VITAMIN C ) 500 MG tablet Take 1 tablet (500 mg total) by mouth daily.   aspirin  EC 81 MG tablet Take 81 mg by mouth daily. Swallow whole.   atorvastatin  (LIPITOR) 80 MG tablet Take 1 tablet (80 mg total) by mouth daily.   cefadroxil  (DURICEF) 500 MG capsule Take 1 capsule (500 mg total) by mouth 2 (two) times daily. Start taking on nov 13th   cyanocobalamin  1000 MCG tablet Take 1 tablet (1,000 mcg total) by mouth daily.   folic acid  (FOLVITE ) 1 MG tablet Take 1 tablet (1 mg total) by mouth daily.   glucosamine-chondroitin 500-400 MG tablet Take 1 tablet by mouth 2 (two) times daily.   iron  polysaccharides (NIFEREX) 150 MG capsule Take 1 capsule (150 mg total) by mouth daily.   latanoprost  (XALATAN ) 0.005 % ophthalmic solution Place 1 drop into both eyes at bedtime.   lidocaine  (SALONPAS PAIN RELIEVING) 4 % Place 1 patch onto the skin daily as needed (for shoulder and hip pain).   lisinopril  (ZESTRIL ) 20 MG tablet Take 1 tablet (20 mg total) by mouth daily.   metoprolol  succinate (TOPROL -XL) 25 MG 24 hr tablet  (Patient taking differently: Take 25 mg by mouth daily.)   sildenafil (VIAGRA) 100 MG tablet Take 100 mg by mouth daily as needed for erectile dysfunction.   tamsulosin  (FLOMAX ) 0.4 MG CAPS capsule Take 0.4 mg by mouth at  bedtime.   tiZANidine  (ZANAFLEX ) 4 MG tablet Take 1 tablet (4 mg total) by mouth 3 (three) times daily.   Vitamin D , Ergocalciferol , (DRISDOL ) 1.25 MG (50000 UNIT) CAPS capsule Take 1 capsule (50,000 Units total) by mouth every 7 (seven) days.   No facility-administered encounter medications on file as of 12/08/2023.     Patient Active Problem List   Diagnosis Date Noted   PICC (peripherally inserted central catheter) flush 10/26/2023   Medication management 10/26/2023   Empyema of right pleural space (HCC) 10/19/2023   Acute osteomyelitis discitis of thoracic spine T11-T12 (HCC) 10/19/2023   Paroxysmal atrial fibrillation (HCC) 10/19/2023   Dizziness 09/01/2023   Frequent falls 09/01/2023   Acute low back pain 09/01/2023   Acute on chronic back pain 08/31/2023   Abdominal pain 08/31/2023   Acute focal neurological deficit 01/14/2022   Crohn's colitis (HCC) 03/10/2018   Scrotal pain 12/07/2017   Carotid artery stenosis    Confusion    TIA (transient ischemic attack) 06/16/2016   Coronary artery disease due to lipid rich plaque 09/14/2013   Pure hypercholesterolemia 09/14/2013   Essential hypertension, benign 09/14/2013   Bradycardia, sinus 09/14/2013     Health Maintenance Due  Topic Date Due   DTaP/Tdap/Td (1 - Tdap) Never done   Zoster Vaccines- Shingrix (1 of 2) Never done   Colonoscopy  03/24/2020  Medicare Annual Wellness (AWV)  10/29/2023   COVID-19 Vaccine (2 - Pfizer risk series) 11/29/2023     Review of Systems 12 point ros is otherwise negative ,except what is mentioned above Physical Exam   BP (!) 158/85   Pulse 68   Temp 98.5 F (36.9 C) (Oral)   Wt 176 lb (79.8 kg)   SpO2 98%   BMI 24.55 kg/m   Physical Exam  Constitutional:  oriented to person, place, and time. appears well-developed and well-nourished. No distress.  HENT: [place]/AT, PERRLA, no scleral icterus Mouth/Throat: Oropharynx is clear and moist. No oropharyngeal exudate.  Cardiovascular:  Normal rate, regular rhythm and normal heart sounds. Exam reveals no gallop and no friction rub.  No murmur heard.  Pulmonary/Chest: Effort normal and breath sounds normal. No respiratory distress.  has no wheezes.  Lymphadenopathy: no cervical adenopathy. No axillary adenopathy Neurological: alert and oriented to person, place, and time.  Skin: Skin is warm and dry. No rash noted. No erythema.  Psychiatric: a normal mood and affect.  behavior is normal.    CBC Lab Results  Component Value Date   WBC 8.3 10/26/2023   RBC 3.16 (L) 10/26/2023   HGB 9.0 (L) 10/26/2023   HCT 29.0 (L) 10/26/2023   PLT 336 10/26/2023   MCV 91.8 10/26/2023   MCH 28.5 10/26/2023   MCHC 31.0 10/26/2023   RDW 14.0 10/26/2023   LYMPHSABS 1.1 10/19/2023   MONOABS 0.6 10/19/2023   EOSABS 0.0 10/19/2023    BMET Lab Results  Component Value Date   NA 137 10/26/2023   K 4.0 10/26/2023   CL 99 10/26/2023   CO2 28 10/26/2023   GLUCOSE 102 (H) 10/26/2023   BUN 11 10/26/2023   CREATININE 1.08 10/26/2023   CALCIUM  8.4 (L) 10/26/2023   GFRNONAA >60 10/26/2023   GFRAA 66 06/07/2019   Lab Results  Component Value Date   ESRSEDRATE 11 12/08/2023   Lab Results  Component Value Date   CRP <3.0 12/08/2023      Assessment and Plan MSSA discitis = currently on chronic suppression. Had heavy burden of disease. We willRefill abtx cefadroxil  1000mg  bid  Long term medication management =Check labs - sed rate and crp  Hypertension = will repeat blood pressure to see if improved. If not, will refer back to pcp  Like to see in 2 months

## 2023-12-09 LAB — SEDIMENTATION RATE: Sed Rate: 11 mm/h (ref 0–20)

## 2023-12-09 LAB — C-REACTIVE PROTEIN: CRP: <3 mg/L (ref <8.0)

## 2023-12-21 ENCOUNTER — Encounter: Payer: Self-pay | Admitting: Infectious Diseases

## 2023-12-23 ENCOUNTER — Other Ambulatory Visit (HOSPITAL_COMMUNITY): Payer: Self-pay

## 2023-12-28 ENCOUNTER — Ambulatory Visit: Admitting: Neurology

## 2023-12-28 ENCOUNTER — Encounter: Payer: Self-pay | Admitting: Neurology

## 2024-01-10 ENCOUNTER — Ambulatory Visit: Admitting: Neurology

## 2024-01-10 ENCOUNTER — Encounter: Payer: Self-pay | Admitting: Neurology

## 2024-01-10 VITALS — BP 162/88 | HR 62 | Ht 71.0 in | Wt 182.0 lb

## 2024-01-10 DIAGNOSIS — G629 Polyneuropathy, unspecified: Secondary | ICD-10-CM

## 2024-01-10 DIAGNOSIS — R251 Tremor, unspecified: Secondary | ICD-10-CM | POA: Diagnosis not present

## 2024-01-10 DIAGNOSIS — R2681 Unsteadiness on feet: Secondary | ICD-10-CM | POA: Diagnosis not present

## 2024-01-10 NOTE — Progress Notes (Signed)
 "   Follow-up Visit   Date: 01/10/2024   Jacob [name] MRN: [id number] DOB: 01/10/1951   Interim History: Jacob [name] is a 73 y.o. right-handed Caucasian male with  CAD s/p CABG, R CEA, TIA , Crohn's disease, hypertension, hyperlipidemia returning to the clinic for with hand tremor and follow-up of neuropathy.  The patient was accompanied to the clinic by self.   IMPRESSION: Tremor and gait instability.  With his worsening gait, further testing for parkinson's disease is recommended.  Previous DAT scan for Parkinson's canceled due to being hospitalized. = - Will order DAT scan for Parkinson's after obtaining new insurance cards. - Recommend using rigid walker for stability - PT declined  2.  Idiopathic neuropathy.  Sensory deficits in hands and legs.   Return to clinic in 4 months  ----------------------------------------------------------  History of present illness: Starting around April 2022, he began having imbalance and numbness/tingling involving the toes and balls of the feet. In May, he saw his PCP for these symptoms and was found to have vitamin B12 deficiency (211) and once he started supplemention, his balance has improved. However, the numbness in his feet is unchanged.  Numbness is constant and involves the toes and balls of the feet.  He denies pain or weakness.  Repeat vitamin B12 is normal, per patient.    He has been on disability for heart disease (2005). He lives with wife, 94 year old, and 22 year-old grandson.  UPDATE 12/09/2920:  He is here for follow-up. There has been no change with the numbness of feet. He continues to have thick padding sensation over the balls of the feet. There has been no progression.  No pain or tingling.  Balance is good.  He had a fall off a 6 foot ladder and injury his left wrist. He got COVID a few weeks ago and has recovered from this.  His B12 level was rechecked by his PCP and improved.  UPDATE 11/28/2021:  He is here  for follow-up.  There hsa been no change in the numbness of his feet, which remains at the pads of the toes and ball of the feet.  No associated tingling.  He sometimes has sensation that his feet are cold or wet.  He has noticed weakness of toe movement and imbalance.  Fortunately, no falls. He remains on B12 supplementation. No new complaints.   UPDATE 08/12/2022:  He is here for follow-up visit.  He was hospitalized in December after accidentally drinking vicks vaporizing solution and having two syncopal spells, confusion, and speech difficulty.  Symptoms self-resolved over the following day.  MRI brain did not show any acute changes.  CTA head and neck showed 40% LICA stenosis, s/p right CEA.  Since this time, he has noticed bilateral hand tremor which is worse when he is trying to write or eat.  He is not aware of any family members with tremors.   Numbness in the feet is unchanged and remains below the ankles.  He is compliant with taking vitamin B12 supplements.   UPDATE 03/31/2023:  He is here for follow-up visit.  He reports having worsening pain in the feet where they feel ice cold and hurt.  Numbness is unchanged and involve the mid-foot to the toes.  His tremors are getting worse and bothering him when trying to eat and do fine motor tasks.  Balance is not very good, he uses a cane at home.  No falls or illnesses.   UPDATE 08/04/2023:  He is  here for follow-up visit.  He stopped gabapentin  because of GI upset.  He reports that tremors are much improved without medication.  He continues to have numbness in the feet and cold sensation.  No burning, stabbing, or stinging pain in the feet.  His balance is poor and he tends to fall back into his chair.  He is very careful when walking, fortunately no falls.   UPDATE 01/10/2024:   Discussed the use of AI scribe software for clinical note transcription with the patient, who gave verbal consent to proceed.  History of Present Illness Jacob [name]  is a 73 year old male who presents with imbalance and tremors.  He has experienced three falls since his last visit in July, which tend to occur with positional changes. He has not engaged in any therapy specifically for balance issues.  He has a history of tremors, which are persistent and noticeable, especially when eating. He feels 'shaky' and notes that the tremors have not improved since his last visit.  He was unable to complete DAT scan due to being hospitalized as mentioned below.    In September, he was hospitalized due to severe pain and diagnosed with osteomyelitis at T11 and T12. He was treated with antibiotics and had a PICC line for two months. No surgical intervention was performed. He had some physical therapy at home post-hospitalization, including assistance with walking and chair exercises.  He uses a cane for mobility but remains wobbly when using it. He has a walker at home but finds it too heavy to pick up. He has not yet received new insurance cards, which may affect the scheduling of further diagnostic tests.    Medications:  Current Outpatient Medications on File Prior to Visit  Medication Sig Dispense Refill   aspirin  EC 81 MG tablet Take 81 mg by mouth daily. Swallow whole.     cyanocobalamin  1000 MCG tablet Take 1 tablet (1,000 mcg total) by mouth daily. 30 tablet 2   glucosamine-chondroitin 500-400 MG tablet Take 1 tablet by mouth 2 (two) times daily.     latanoprost  (XALATAN ) 0.005 % ophthalmic solution Place 1 drop into both eyes at bedtime.     lidocaine  (SALONPAS PAIN RELIEVING) 4 % Place 1 patch onto the skin daily as needed (for shoulder and hip pain).     lisinopril  (ZESTRIL ) 20 MG tablet Take 1 tablet (20 mg total) by mouth daily. 30 tablet 11   metoprolol  succinate (TOPROL -XL) 25 MG 24 hr tablet  (Patient taking differently: Take 25 mg by mouth daily.)     sildenafil (VIAGRA) 100 MG tablet Take 100 mg by mouth daily as needed for erectile dysfunction.      Vitamin D , Ergocalciferol , (DRISDOL ) 1.25 MG (50000 UNIT) CAPS capsule Take 1 capsule (50,000 Units total) by mouth every 7 (seven) days. 12 capsule 0   amiodarone  (PACERONE ) 200 MG tablet Take 1 tablet (200 mg total) by mouth daily. (Patient not taking: Reported on 01/10/2024) 90 tablet 1   [Paused] amLODipine  (NORVASC ) 5 MG tablet Take 1 tablet (5 mg total) by mouth daily. (Patient not taking: Reported on 01/10/2024) 90 tablet 0   ascorbic acid  (VITAMIN C ) 500 MG tablet Take 1 tablet (500 mg total) by mouth daily. (Patient not taking: Reported on 01/10/2024) 30 tablet 2   atorvastatin  (LIPITOR) 80 MG tablet Take 1 tablet (80 mg total) by mouth daily. 90 tablet 3   cefadroxil  (DURICEF) 500 MG capsule Take 2 capsules (1,000 mg total) by mouth  2 (two) times daily. Start taking on nov 13th (Patient not taking: Reported on 01/10/2024) 120 capsule 2   folic acid  (FOLVITE ) 1 MG tablet Take 1 tablet (1 mg total) by mouth daily. (Patient not taking: Reported on 01/10/2024) 30 tablet 2   iron  polysaccharides (NIFEREX) 150 MG capsule Take 1 capsule (150 mg total) by mouth daily. (Patient not taking: Reported on 01/10/2024) 30 capsule 2   tamsulosin  (FLOMAX ) 0.4 MG CAPS capsule Take 0.4 mg by mouth at bedtime. (Patient not taking: Reported on 01/10/2024)     tiZANidine  (ZANAFLEX ) 4 MG tablet Take 1 tablet (4 mg total) by mouth 3 (three) times daily. (Patient not taking: Reported on 01/10/2024) 30 tablet 3   No current facility-administered medications on file prior to visit.    Allergies: No Known Allergies  Vital Signs:  BP (!) 175/80   Pulse 62   Ht 5' 11 (1.803 m)   Wt 182 lb (82.6 kg)   SpO2 97%   BMI 25.38 kg/m   Neurological Exam: MENTAL STATUS including orientation to time, place, person, recent and remote memory, attention span and concentration, language, and fund of knowledge is normal.  Speech is not dysarthric.  Blunted affect.  CRANIAL NERVES:  Normal conjugate, extra-ocular eye  movements in all directions of gaze.  No ptosis. Reduced blink.  Face is symmetric.   MOTOR:  Motor strength is 5/5 in all extremities, except distally in the feet with toe extension and flexion 4/5. There is trace tremor of the right hand on exam, which is worse with finger to nose testing.  Tone is normal.     MSRs:  Reflexes are 2+/4 throughout, except absent at the ankles.  SENSORY:  Intact to vibration at the knees, reduced below the ankles.  COORDINATION/GAIT:   There is mild bradykinesia with finger and toe tapping bilaterally.   It took him several attempts to stand up even with using arms to push off.  Gait is slow, wide-based, shuffling, and unsteady, assisted with cane.   Data: MRI brain wo contrast 01/14/2022: 1. No acute intracranial abnormality. 2. Mild chronic microvascular ischemic disease for age.  CTA head and neck 01/15/2023: CTA neck: 1. A right carotid endarterectomy has been performed since the prior CTA head/neck of 06/16/2016. The common carotid and internal carotid arteries are patent within the neck without hemodynamically significant stenosis. Unchanged 40% stenosis at the origin of the left ICA. 2. Vertebral arteries patent within the neck. Unchanged moderate-to-severe atherosclerotic narrowing at the origin of the non-dominant left vertebral artery.   CTA head: No intracranial large vessel occlusion or proximal high-grade arterial stenosis identified.    Thank you for allowing me to participate in patient's care.  If I can answer any additional questions, I would be pleased to do so.    Sincerely,    Aarav Burgett K. Tobie, DO  "

## 2024-01-10 NOTE — Patient Instructions (Signed)
 Please provide your new insurance cards when available, so we can order DAT scan  Always use your walker  If you would like to start physical therapy, please let me know

## 2024-01-17 ENCOUNTER — Ambulatory Visit
Attending: Student in an Organized Health Care Education/Training Program | Admitting: Student in an Organized Health Care Education/Training Program

## 2024-01-17 ENCOUNTER — Encounter: Payer: Self-pay | Admitting: Student in an Organized Health Care Education/Training Program

## 2024-01-17 VITALS — BP 124/80 | HR 56 | Ht 71.0 in | Wt 179.0 lb

## 2024-01-17 DIAGNOSIS — R001 Bradycardia, unspecified: Secondary | ICD-10-CM | POA: Diagnosis not present

## 2024-01-17 DIAGNOSIS — Z5181 Encounter for therapeutic drug level monitoring: Secondary | ICD-10-CM

## 2024-01-17 DIAGNOSIS — I480 Paroxysmal atrial fibrillation: Secondary | ICD-10-CM

## 2024-01-17 DIAGNOSIS — Z79899 Other long term (current) drug therapy: Secondary | ICD-10-CM | POA: Diagnosis not present

## 2024-01-17 MED ORDER — AMIODARONE HCL 200 MG PO TABS: 200.0000 mg | ORAL_TABLET | Freq: Every day | ORAL | 1 refills | Status: AC

## 2024-01-17 NOTE — Patient Instructions (Signed)

## 2024-01-17 NOTE — Progress Notes (Unsigned)
 " Cardiology Office Note   Date: 01/17/24 ID:  DUQUAN GILLOOLY, DOB 05/08/1950, MRN [id number] PCP: [name], MD  [facility] Providers Cardiologist:  Wilbert Bihari, MD Electrophysiologist:  Donnice DELENA Primus, MD    History of Present Illness Jacob [name] is a 73 y.o. male with persistent AF/RVR, CAD s/p CABG (2005) with PCI to SVG-RI (2008), HTN, HLD, carotid artery stenosis s/p endarterectomy (2018), prior TIA/CVA, Crohn's disease, ulcerative colitis, chronic back pain who presents for atrial arrhythmia management and associated stroke risk.   Discussed the use of AI scribe software for clinical note transcription with the patient, who gave verbal consent to proceed.  History of Present Illness Jacob [name] is a 73 year old male with atrial fibrillation and a history of strokes who presents for evaluation of stroke risk management. He is accompanied by [place], a family member. He was referred for evaluation of his atrial fibrillation and stroke risk management.  He has a history of atrial fibrillation and previous strokes. Recently, he was hospitalized due to pneumonia, which led to a lung infection. He was previously on a blood thinner but discontinued it because he was told he might not tolerate it long term.  He experiences ongoing back pain, located in the right side and upper back, described as a 'knot in the middle of my back'. The pain has improved since hospitalization but persists with movements like squatting or bending over. An MRI is scheduled next week to assess the fluid in his back and spine.  He is currently taking an antibiotic and a baby aspirin  (81 mg). He was previously on amiodarone  for atrial fibrillation but has run out of the medication. He also takes medication for high blood pressure.  He has experienced frequent falls, which he attributes to spinal issues. His back pain prevents him from engaging in activities he previously enjoyed, such as  working in the yard and [employer].  He was admitted this summer in 08/2023 for MSSA empyema and was found to have new onset AF/RVR at that time he chemically cardioverted with amiodarone  IV.  ROS: none  Studies Reviewed  Zio monitor  Result date: 10/11/23-10/25/23 HR 45 - 190, average 68 bpm. 2 nonsustained SVT (longest 5 beats) Atrial fibrillation detected. Rare supraventricular ectopy. Rare ventricular ectopy. 4% atrial fibrillation burden, longest 9 hours 21 minutes with an average heart rate of 122 bpm Symptom trigger episodes correspond to atrial fibrillation  TTE Result date: 10/21/23  1. Left ventricular ejection fraction, by estimation, is 50 to 55%. The  left ventricle has low normal function. The left ventricle has no regional  wall motion abnormalities. Left ventricular diastolic parameters are  consistent with Grade I diastolic  dysfunction (impaired relaxation).   2. Right ventricular systolic function is mildly reduced. The right  ventricular size is normal.   3. The mitral valve is grossly normal. Trivial mitral valve  regurgitation.   4. The aortic valve is tricuspid. There is mild calcification of the  aortic valve. Aortic valve regurgitation is not visualized.   Risk Assessment/Calculations  CHA2DS2-VASc Score = 5  This indicates a 7.2% annual risk of stroke. The patient's score is based upon: CHF History: 0 HTN History: 1 Diabetes History: 0 Stroke History: 2 Vascular Disease History: 1 Age Score: 1 Gender Score: 0  Physical Exam VS:  BP 124/80 (BP Location: Right Arm, Patient Position: Sitting, Cuff Size: Normal)   Pulse (!) 56   Ht 5' 11 (1.803 m)  Wt 179 lb (81.2 kg)   SpO2 96%   BMI 24.97 kg/m   Wt Readings from Last 3 Encounters:  01/17/24 179 lb (81.2 kg)  01/10/24 182 lb (82.6 kg)  12/08/23 176 lb (79.8 kg)    GEN: Well nourished, well developed in no acute distress NECK: No JVD; No carotid bruits CARDIAC: RRR, no  murmurs, rubs, gallops RESPIRATORY:  Clear to auscultation without rales, wheezing or rhonchi  ABDOMEN: Soft, non-tender, non-distended EXTREMITIES:  No edema; No deformity   ASSESSMENT AND PLAN Jacob [name] is a 73 y.o. male with persistent AF/RVR, CAD s/p CABG (2005) with PCI to SVG-RI (2008), HTN, HLD, carotid artery stenosis s/p endarterectomy (2018), prior TIA/CVA, Crohn's disease, ulcerative colitis, chronic back pain who presents for atrial arrhythmia management and associated stroke risk.  Assessment & Plan Paroxysmal atrial fibrillation Chronic paroxysmal AFib with stroke risk due to anticoagulation intolerance. Previous strokes likely from undiagnosed AFib. Current management with amiodarone , AFib persists. Discussed Watchman device for stroke risk reduction, allowing potential anticoagulation discontinuation. Risks include 1-2% complications. Benefits include ~90% stroke risk reduction. Preference to wait for back surgery evaluation before proceeding. - Refill amio - Coordinate with surgeon on procedure timing. - Order CT scan if proceeding with Watchman device.  Amiodarone  therapy monitoring Amiodarone  used for sinus rhythm maintenance. Therapy reduced during hospitalization, should be restarted for AFib management. - Restarted amiodarone  therapy.  History of ischemic stroke Ischemic stroke likely from undiagnosed AFib. Current stroke risk 7% per year without anticoagulation. Discussed anticoagulation vs. Watchman device for stroke risk mitigation. - Discuss stroke risk management post-surgical evaluation.  I have seen Jacob [name] in the office today who is being considered for a Watchman left atrial appendage closure device. I believe they will benefit from this procedure given their history of atrial fibrillation, CHA2DS2-VASc score of 5 and unadjusted ischemic stroke rate of 7.2% per year. Unfortunately, the patient is not felt to be a long term anticoagulation  candidate secondary to frequent falls. The patient's chart has been reviewed and I feel that they would be a candidate for short term oral anticoagulation after Watchman implant.   It is my belief that after undergoing a LAA closure procedure, Jacob [name] will not need long term anticoagulation which eliminates anticoagulation side effects and major bleeding risk.   Procedural risks for the Watchman implant have been reviewed with the patient including a 0.5% risk of stroke, <1% risk of perforation and <1% risk of device embolization. Other risks include bleeding, vascular damage, tamponade, worsening renal function, and death. The patient understands these risk.   The published clinical data on the safety and effectiveness of WATCHMAN include but are not limited to the following: - Holmes DR, Jess BEARD, Sick P et al. for the PROTECT AF Investigators. Percutaneous closure of the left atrial appendage versus warfarin therapy for prevention of stroke in patients with atrial fibrillation: a randomised non-inferiority trial. Lancet 2009; 374: 534-42. GLENWOOD Jess BEARD, Doshi SK, Jonita VEAR Satchel D et al. on behalf of the PROTECT AF Investigators. Percutaneous Left Atrial Appendage Closure for Stroke Prophylaxis in Patients With Atrial Fibrillation 2.3-Year Follow-up of the PROTECT AF (Watchman Left Atrial Appendage System for Embolic Protection in Patients With Atrial Fibrillation) Trial. Circulation 2013; 127:720-729. - Alli O, Doshi S,  Kar S, Reddy VY, Sievert H et al. Quality of Life Assessment in the Randomized PROTECT AF (Percutaneous Closure of the Left Atrial Appendage Versus Warfarin Therapy for Prevention of Stroke in Patients With  Atrial Fibrillation) Trial of Patients at Risk for Stroke With Nonvalvular Atrial Fibrillation. J Am Coll Cardiol 2013; 61:1790-8. GLENWOOD Satchel DR, Archer RAMAN, Price M, Whisenant B, Sievert H, Doshi S, Huber K, Reddy V. Prospective randomized evaluation of the Watchman left atrial  appendage Device in patients with atrial fibrillation versus long-term warfarin therapy; the PREVAIL trial. Journal of the [employer] of Cardiology, Vol. 4, No. 1, 2014, 1-11. - Kar S, Doshi SK, Sadhu A, Horton R, Osorio J et al. Primary outcome evaluation of a next-generation left atrial appendage closure device: results from the PINNACLE FLX trial. Circulation 2021;143(18)1754-1762.   After today's visit with the patient which was dedicated solely for shared decision making visit regarding LAA closure device, the patient is interested in Watchman but I recommended we first f/u with his surgery team He has upcoming scans to guide whether he will need additional back/spine surgery. I wouldn't want to commit him to uninterrupted OAC and/or DAPT until I understand the plan for his back issues.   Prior to the procedure, I would like to obtain a gated CT scan of the chest with contrast timed for PV/LA visualization if he wants to proceed.   HAS-BLED score 2 Hypertension No  Abnormal renal and liver function (Dialysis, transplant, Cr >2.26 mg/dL /Cirrhosis or Bilirubin >2x Normal or AST/ALT/AP >3x Normal) No  Stroke Yes  Bleeding No  Labile INR (Unstable/high INR) No  Elderly (>65) Yes  Drugs or alcohol (>= 8 drinks/week, anti-plt or NSAID) No   CHA2DS2-VASc Score = 5  The patient's score is based upon: CHF History: 0 HTN History: 1 Diabetes History: 0 Stroke History: 2 Vascular Disease History: 1 Age Score: 1 Gender Score: 0  NYHA class I   A total of 35 minutes was spent preparing for the patient, reviewing history, performing exam, document encounter, coordinating care and counseling the patient. 20 minutes was spent with direct patient care.   Signed,   Donnice DELENA Primus, MD [facility]  Cardiac Electrophysiology  "

## 2024-01-17 NOTE — Progress Notes (Unsigned)
" °  Cardiology Office Note   Date:  01/17/2024  ID:  Jacob [name], DOB 05/11/1950, MRN [id number] PCP: [name], MD  [facility] Providers Cardiologist:  Wilbert Bihari, MD { Click to update primary MD,subspecialty MD or APP then REFRESH:1}    History of Present Illness Jacob [name] is a 73 y.o. male ***  ROS: ***  Studies Reviewed      *** Risk Assessment/Calculations {Does this patient have ATRIAL FIBRILLATION?:[phone]} No BP recorded.  {Refresh Note OR Click here to enter BP  :1}***       Physical Exam VS:  There were no vitals taken for this visit.       Wt Readings from Last 3 Encounters:  01/10/24 182 lb (82.6 kg)  12/08/23 176 lb (79.8 kg)  11/08/23 186 lb (84.4 kg)    GEN: Well nourished, well developed in no acute distress NECK: No JVD; No carotid bruits CARDIAC: ***RRR, no murmurs, rubs, gallops RESPIRATORY:  Clear to auscultation without rales, wheezing or rhonchi  ABDOMEN: Soft, non-tender, non-distended EXTREMITIES:  No edema; No deformity   ASSESSMENT AND PLAN ***    {Are you ordering a CV Procedure (e.g. stress test, cath, DCCV, TEE, etc)?   Press F2        :[id number]}  Dispo: ***  Signed, Donnice DELENA Primus, MD  "

## 2024-02-07 ENCOUNTER — Ambulatory Visit: Admitting: Internal Medicine

## 2024-02-14 ENCOUNTER — Ambulatory Visit: Payer: Self-pay | Admitting: Internal Medicine

## 2024-02-21 ENCOUNTER — Ambulatory Visit: Admitting: Internal Medicine

## 2024-02-24 ENCOUNTER — Ambulatory Visit: Admitting: Internal Medicine

## 2024-03-20 ENCOUNTER — Ambulatory Visit: Admitting: Internal Medicine

## 2024-06-06 ENCOUNTER — Ambulatory Visit: Payer: Self-pay | Admitting: Neurology
